# Patient Record
Sex: Female | Born: 1955 | Race: Black or African American | Hispanic: No | Smoking: Former smoker
Health system: Southern US, Community
[De-identification: ages and names within clinical notes are randomized; demographics above are authoritative.]

## PROBLEM LIST (undated history)

## (undated) DIAGNOSIS — G8112 Spastic hemiplegia affecting left dominant side: Secondary | ICD-10-CM

## (undated) DIAGNOSIS — K922 Gastrointestinal hemorrhage, unspecified: Secondary | ICD-10-CM

## (undated) DIAGNOSIS — K759 Inflammatory liver disease, unspecified: Secondary | ICD-10-CM

## (undated) DIAGNOSIS — I10 Essential (primary) hypertension: Secondary | ICD-10-CM

## (undated) DIAGNOSIS — G8929 Other chronic pain: Secondary | ICD-10-CM

## (undated) DIAGNOSIS — I639 Cerebral infarction, unspecified: Secondary | ICD-10-CM

## (undated) DIAGNOSIS — E785 Hyperlipidemia, unspecified: Secondary | ICD-10-CM

## (undated) DIAGNOSIS — M79606 Pain in leg, unspecified: Secondary | ICD-10-CM

## (undated) DIAGNOSIS — F4310 Post-traumatic stress disorder, unspecified: Secondary | ICD-10-CM

## (undated) DIAGNOSIS — K529 Noninfective gastroenteritis and colitis, unspecified: Secondary | ICD-10-CM

## (undated) DIAGNOSIS — F1010 Alcohol abuse, uncomplicated: Secondary | ICD-10-CM

## (undated) DIAGNOSIS — K259 Gastric ulcer, unspecified as acute or chronic, without hemorrhage or perforation: Secondary | ICD-10-CM

## (undated) DIAGNOSIS — K760 Fatty (change of) liver, not elsewhere classified: Secondary | ICD-10-CM

## (undated) DIAGNOSIS — K859 Acute pancreatitis without necrosis or infection, unspecified: Secondary | ICD-10-CM

## (undated) DIAGNOSIS — H539 Unspecified visual disturbance: Secondary | ICD-10-CM

## (undated) DIAGNOSIS — R569 Unspecified convulsions: Secondary | ICD-10-CM

## (undated) DIAGNOSIS — H548 Legal blindness, as defined in USA: Secondary | ICD-10-CM

## (undated) DIAGNOSIS — F329 Major depressive disorder, single episode, unspecified: Secondary | ICD-10-CM

## (undated) DIAGNOSIS — M549 Dorsalgia, unspecified: Secondary | ICD-10-CM

## (undated) DIAGNOSIS — N390 Urinary tract infection, site not specified: Secondary | ICD-10-CM

## (undated) HISTORY — DX: Unspecified convulsions: R56.9

## (undated) HISTORY — DX: Gastric ulcer, unspecified as acute or chronic, without hemorrhage or perforation: K25.9

## (undated) HISTORY — DX: Unspecified visual disturbance: H53.9

## (undated) HISTORY — DX: Pain in leg, unspecified: M79.606

## (undated) HISTORY — DX: Fatty (change of) liver, not elsewhere classified: K76.0

## (undated) HISTORY — DX: Legal blindness, as defined in USA: H54.8

## (undated) HISTORY — PX: APPENDECTOMY: SHX54

## (undated) HISTORY — PX: ESOPHAGOGASTRODUODENOSCOPY ENDOSCOPY: SHX5814

## (undated) HISTORY — DX: Essential (primary) hypertension: I10

## (undated) HISTORY — PX: OTHER SURGICAL HISTORY: SHX169

## (undated) HISTORY — DX: Gastrointestinal hemorrhage, unspecified: K92.2

## (undated) HISTORY — PX: CATARACT EXTRACTION: SUR2

## (undated) HISTORY — DX: Spastic hemiplegia affecting left dominant side: G81.12

## (undated) HISTORY — PX: ABDOMINAL HYSTERECTOMY: SHX81

## (undated) HISTORY — DX: Hyperlipidemia, unspecified: E78.5

---

## 2005-12-25 ENCOUNTER — Emergency Department (HOSPITAL_COMMUNITY): Admission: EM | Admit: 2005-12-25 | Discharge: 2005-12-25 | Payer: Self-pay | Admitting: Emergency Medicine

## 2006-01-31 ENCOUNTER — Emergency Department (HOSPITAL_COMMUNITY): Admission: EM | Admit: 2006-01-31 | Discharge: 2006-01-31 | Payer: Self-pay | Admitting: Emergency Medicine

## 2006-03-21 ENCOUNTER — Emergency Department (HOSPITAL_COMMUNITY): Admission: EM | Admit: 2006-03-21 | Discharge: 2006-03-22 | Payer: Self-pay | Admitting: Emergency Medicine

## 2006-03-27 ENCOUNTER — Ambulatory Visit: Payer: Self-pay | Admitting: Nurse Practitioner

## 2006-04-05 ENCOUNTER — Ambulatory Visit: Payer: Self-pay | Admitting: Internal Medicine

## 2006-05-22 ENCOUNTER — Ambulatory Visit: Payer: Self-pay | Admitting: Internal Medicine

## 2006-05-22 ENCOUNTER — Inpatient Hospital Stay (HOSPITAL_COMMUNITY): Admission: EM | Admit: 2006-05-22 | Discharge: 2006-05-26 | Payer: Self-pay | Admitting: Emergency Medicine

## 2006-05-23 ENCOUNTER — Encounter: Payer: Self-pay | Admitting: Gastroenterology

## 2006-05-23 ENCOUNTER — Encounter (INDEPENDENT_AMBULATORY_CARE_PROVIDER_SITE_OTHER): Payer: Self-pay | Admitting: Cardiology

## 2006-05-23 DIAGNOSIS — K2100 Gastro-esophageal reflux disease with esophagitis, without bleeding: Secondary | ICD-10-CM | POA: Insufficient documentation

## 2006-05-30 ENCOUNTER — Ambulatory Visit: Payer: Self-pay | Admitting: Gastroenterology

## 2006-06-02 ENCOUNTER — Ambulatory Visit: Payer: Self-pay | Admitting: Nurse Practitioner

## 2006-06-24 ENCOUNTER — Emergency Department (HOSPITAL_COMMUNITY): Admission: EM | Admit: 2006-06-24 | Discharge: 2006-06-25 | Payer: Self-pay | Admitting: Emergency Medicine

## 2006-06-30 ENCOUNTER — Inpatient Hospital Stay (HOSPITAL_COMMUNITY): Admission: EM | Admit: 2006-06-30 | Discharge: 2006-07-06 | Payer: Self-pay | Admitting: Emergency Medicine

## 2006-08-18 ENCOUNTER — Emergency Department (HOSPITAL_COMMUNITY): Admission: EM | Admit: 2006-08-18 | Discharge: 2006-08-19 | Payer: Self-pay | Admitting: Emergency Medicine

## 2006-09-05 ENCOUNTER — Emergency Department (HOSPITAL_COMMUNITY): Admission: EM | Admit: 2006-09-05 | Discharge: 2006-09-05 | Payer: Self-pay | Admitting: Emergency Medicine

## 2006-10-04 ENCOUNTER — Ambulatory Visit: Payer: Self-pay | Admitting: Internal Medicine

## 2006-10-18 ENCOUNTER — Emergency Department (HOSPITAL_COMMUNITY): Admission: EM | Admit: 2006-10-18 | Discharge: 2006-10-18 | Payer: Self-pay | Admitting: Emergency Medicine

## 2006-11-01 ENCOUNTER — Ambulatory Visit: Payer: Self-pay | Admitting: Gastroenterology

## 2006-11-09 ENCOUNTER — Ambulatory Visit (HOSPITAL_COMMUNITY): Admission: RE | Admit: 2006-11-09 | Discharge: 2006-11-09 | Payer: Self-pay | Admitting: Gastroenterology

## 2006-11-09 ENCOUNTER — Encounter: Payer: Self-pay | Admitting: Internal Medicine

## 2006-11-09 DIAGNOSIS — K859 Acute pancreatitis without necrosis or infection, unspecified: Secondary | ICD-10-CM

## 2006-11-15 ENCOUNTER — Ambulatory Visit: Payer: Self-pay | Admitting: Gastroenterology

## 2006-11-16 ENCOUNTER — Inpatient Hospital Stay (HOSPITAL_COMMUNITY): Admission: EM | Admit: 2006-11-16 | Discharge: 2006-11-20 | Payer: Self-pay | Admitting: Emergency Medicine

## 2006-11-30 ENCOUNTER — Ambulatory Visit: Payer: Self-pay | Admitting: Internal Medicine

## 2007-02-02 ENCOUNTER — Emergency Department (HOSPITAL_COMMUNITY): Admission: EM | Admit: 2007-02-02 | Discharge: 2007-02-02 | Payer: Self-pay | Admitting: Emergency Medicine

## 2007-06-16 DIAGNOSIS — I6350 Cerebral infarction due to unspecified occlusion or stenosis of unspecified cerebral artery: Secondary | ICD-10-CM | POA: Insufficient documentation

## 2007-06-16 DIAGNOSIS — F1010 Alcohol abuse, uncomplicated: Secondary | ICD-10-CM | POA: Insufficient documentation

## 2007-06-16 DIAGNOSIS — K219 Gastro-esophageal reflux disease without esophagitis: Secondary | ICD-10-CM | POA: Insufficient documentation

## 2007-06-16 DIAGNOSIS — F329 Major depressive disorder, single episode, unspecified: Secondary | ICD-10-CM

## 2007-06-16 DIAGNOSIS — F1410 Cocaine abuse, uncomplicated: Secondary | ICD-10-CM | POA: Insufficient documentation

## 2007-06-16 DIAGNOSIS — J45909 Unspecified asthma, uncomplicated: Secondary | ICD-10-CM | POA: Insufficient documentation

## 2007-06-16 DIAGNOSIS — I10 Essential (primary) hypertension: Secondary | ICD-10-CM | POA: Insufficient documentation

## 2007-08-14 ENCOUNTER — Emergency Department (HOSPITAL_COMMUNITY): Admission: EM | Admit: 2007-08-14 | Discharge: 2007-08-15 | Payer: Self-pay | Admitting: Emergency Medicine

## 2007-10-30 ENCOUNTER — Inpatient Hospital Stay (HOSPITAL_COMMUNITY): Admission: AD | Admit: 2007-10-30 | Discharge: 2007-11-08 | Payer: Self-pay | Admitting: *Deleted

## 2007-10-30 ENCOUNTER — Ambulatory Visit: Payer: Self-pay | Admitting: *Deleted

## 2008-01-06 ENCOUNTER — Encounter: Payer: Self-pay | Admitting: Emergency Medicine

## 2008-01-06 ENCOUNTER — Inpatient Hospital Stay (HOSPITAL_COMMUNITY): Admission: AD | Admit: 2008-01-06 | Discharge: 2008-01-14 | Payer: Self-pay | Admitting: Psychiatry

## 2008-01-06 ENCOUNTER — Ambulatory Visit: Payer: Self-pay | Admitting: Psychiatry

## 2008-01-16 ENCOUNTER — Encounter: Admission: RE | Admit: 2008-01-16 | Discharge: 2008-01-16 | Payer: Self-pay | Admitting: Internal Medicine

## 2008-01-18 ENCOUNTER — Emergency Department (HOSPITAL_COMMUNITY): Admission: EM | Admit: 2008-01-18 | Discharge: 2008-01-18 | Payer: Self-pay | Admitting: Emergency Medicine

## 2008-03-12 ENCOUNTER — Emergency Department (HOSPITAL_COMMUNITY): Admission: EM | Admit: 2008-03-12 | Discharge: 2008-03-13 | Payer: Self-pay | Admitting: Emergency Medicine

## 2008-04-02 ENCOUNTER — Inpatient Hospital Stay (HOSPITAL_COMMUNITY): Admission: EM | Admit: 2008-04-02 | Discharge: 2008-04-07 | Payer: Self-pay | Admitting: Emergency Medicine

## 2008-04-05 ENCOUNTER — Ambulatory Visit: Payer: Self-pay | Admitting: Gastroenterology

## 2008-05-24 ENCOUNTER — Emergency Department (HOSPITAL_COMMUNITY): Admission: EM | Admit: 2008-05-24 | Discharge: 2008-05-24 | Payer: Self-pay | Admitting: Emergency Medicine

## 2008-08-09 ENCOUNTER — Emergency Department (HOSPITAL_COMMUNITY): Admission: EM | Admit: 2008-08-09 | Discharge: 2008-08-09 | Payer: Self-pay | Admitting: Emergency Medicine

## 2008-10-17 ENCOUNTER — Encounter: Admission: RE | Admit: 2008-10-17 | Discharge: 2008-10-17 | Payer: Self-pay | Admitting: Internal Medicine

## 2008-12-28 ENCOUNTER — Emergency Department (HOSPITAL_COMMUNITY): Admission: EM | Admit: 2008-12-28 | Discharge: 2008-12-28 | Payer: Self-pay | Admitting: Emergency Medicine

## 2009-01-01 ENCOUNTER — Inpatient Hospital Stay (HOSPITAL_COMMUNITY): Admission: EM | Admit: 2009-01-01 | Discharge: 2009-01-06 | Payer: Self-pay | Admitting: Emergency Medicine

## 2009-10-18 ENCOUNTER — Emergency Department (HOSPITAL_COMMUNITY): Admission: EM | Admit: 2009-10-18 | Discharge: 2009-10-18 | Payer: Self-pay | Admitting: Emergency Medicine

## 2009-10-24 IMAGING — CR DG CHEST 2V
2 series · 2 of 2 positions shown · non-contrast
Comparison: [HOSPITAL] chest x-ray 08/09/2008.

CLINICAL DATA: Positive PPD.

CHEST - 2 VIEW

[w chest pa]
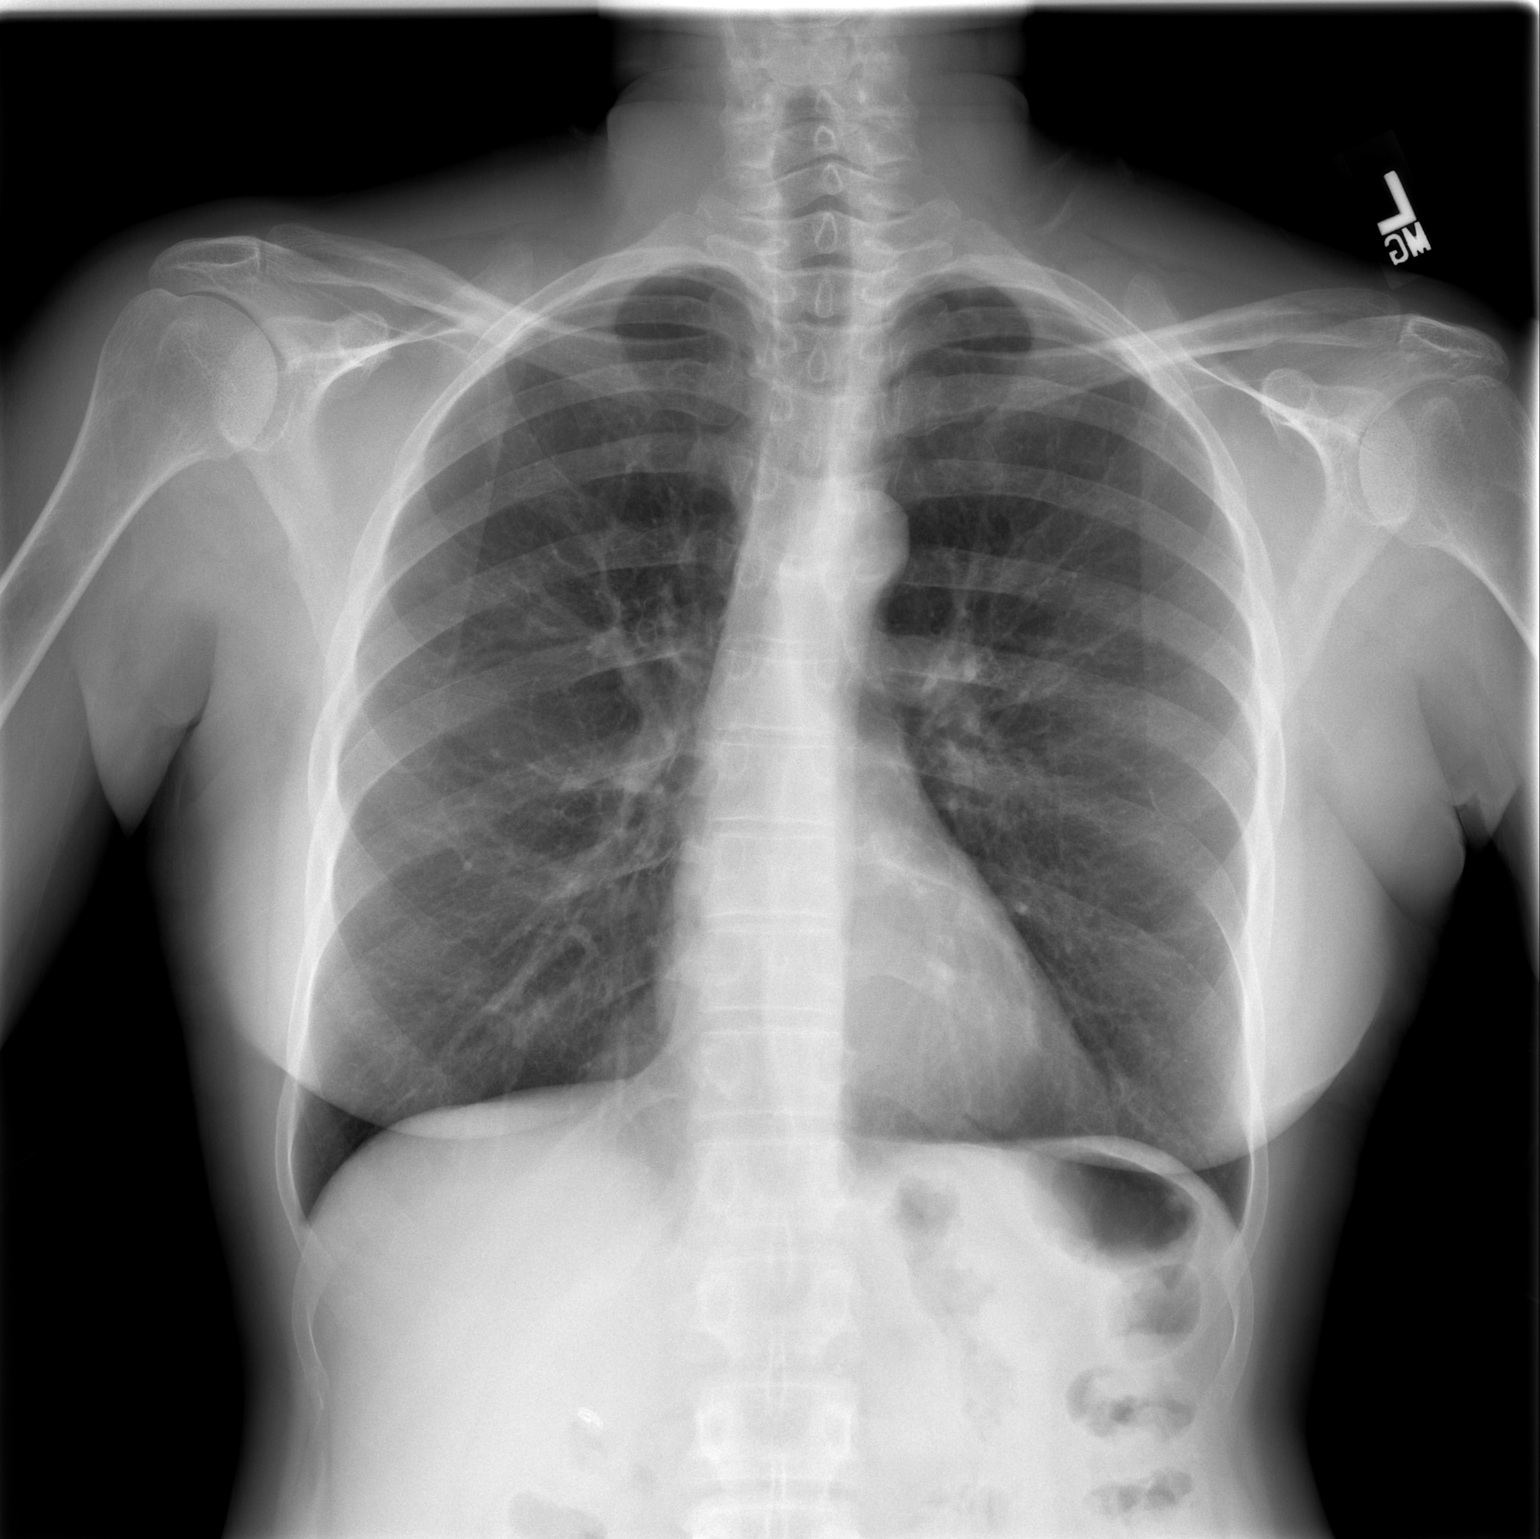

[w chest lat]
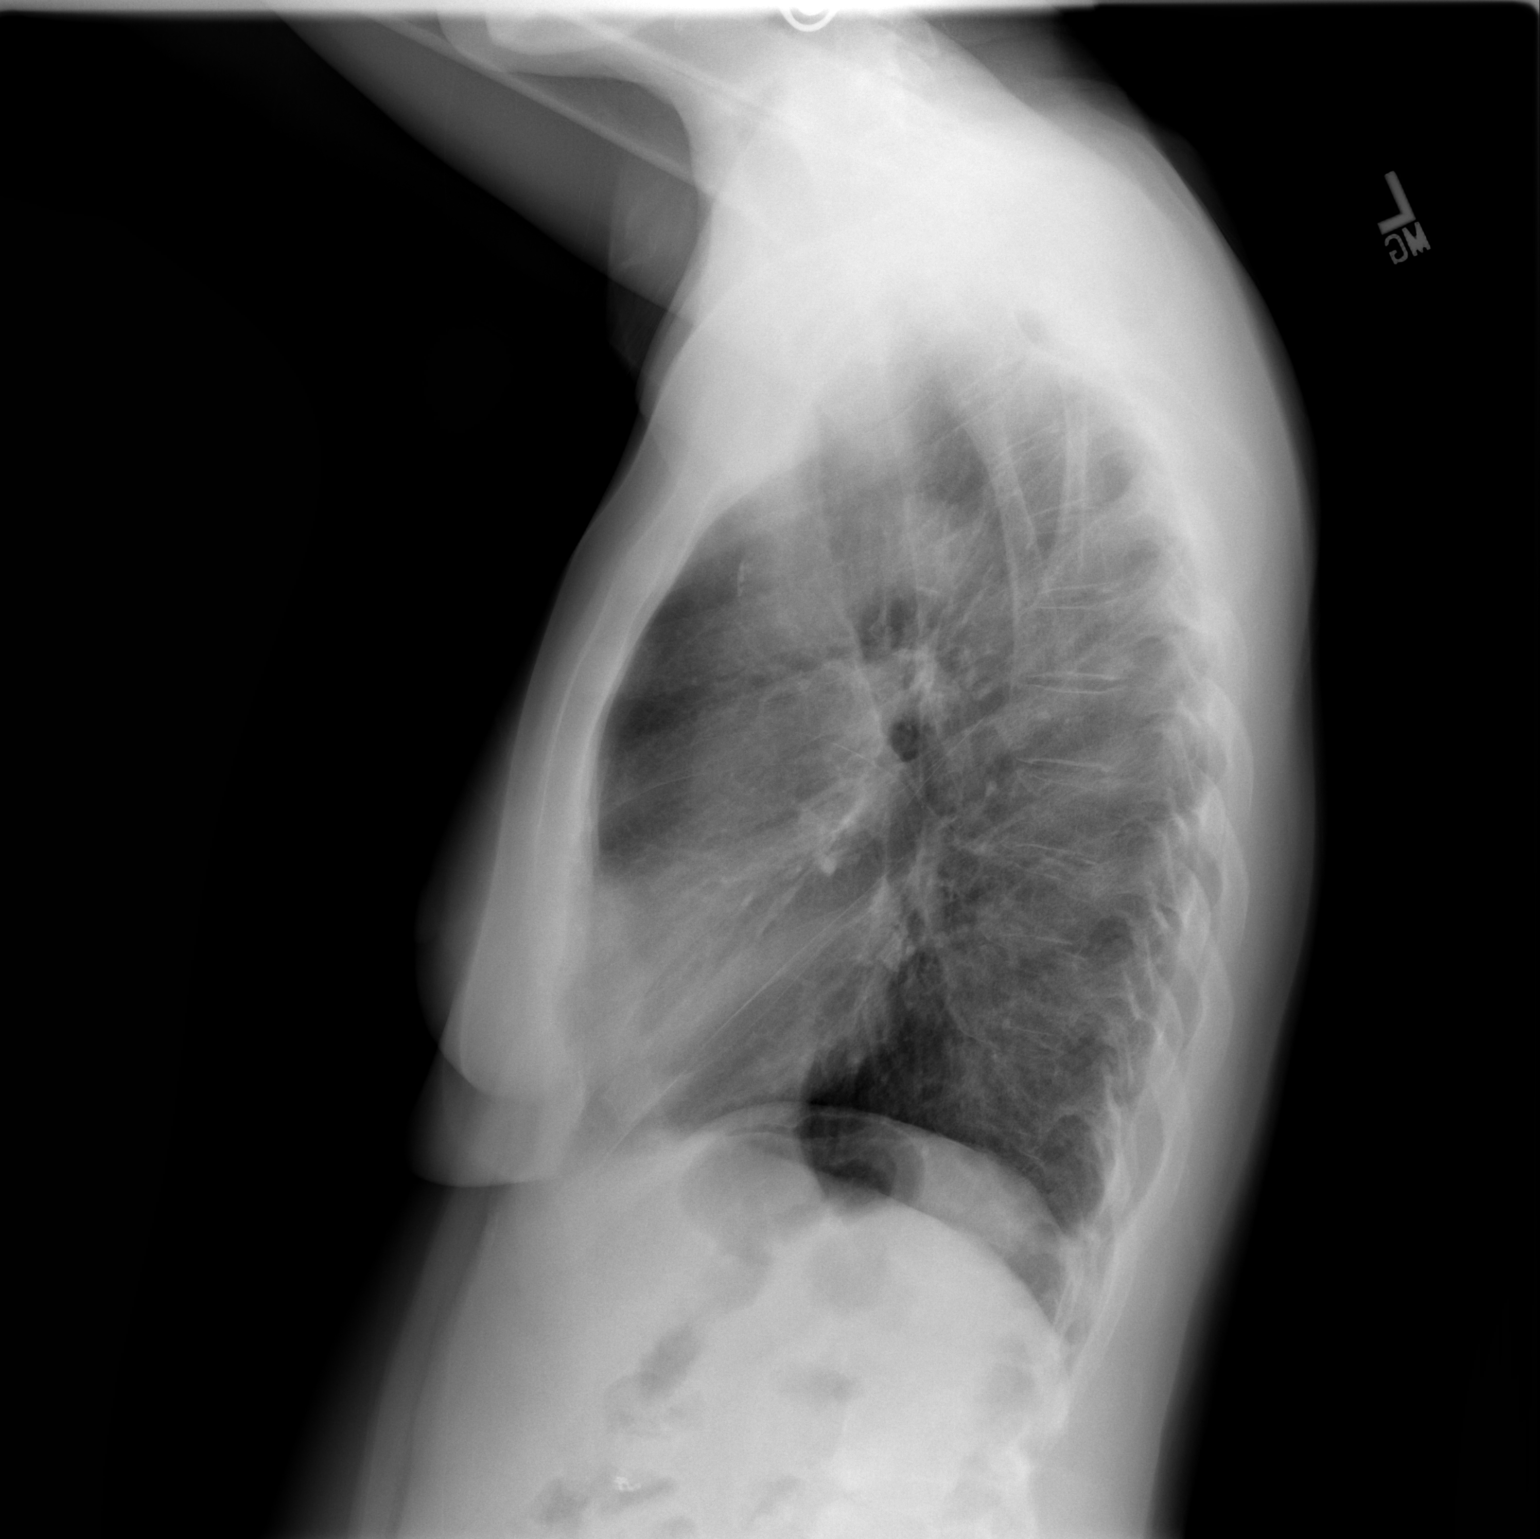

[2 of 2 positions shown; findings below may reference images not displayed]

FINDINGS: Lungs are clear.  Heart size and configuration normal.
Mediastinum, hila, pleura and osseous structures remains stable and
unremarkable.
IMPRESSION: Stable.  No active cardiopulmonary disease.

## 2010-02-25 ENCOUNTER — Encounter
Admission: RE | Admit: 2010-02-25 | Discharge: 2010-02-25 | Payer: Self-pay | Source: Home / Self Care | Attending: Internal Medicine | Admitting: Internal Medicine

## 2010-04-06 ENCOUNTER — Encounter
Admission: RE | Admit: 2010-04-06 | Discharge: 2010-04-13 | Payer: Self-pay | Source: Home / Self Care | Attending: Internal Medicine | Admitting: Internal Medicine

## 2010-04-14 ENCOUNTER — Ambulatory Visit: Payer: Self-pay

## 2010-04-19 ENCOUNTER — Emergency Department (HOSPITAL_COMMUNITY)
Admission: EM | Admit: 2010-04-19 | Discharge: 2010-04-19 | Disposition: A | Discharge status: Home / Self Care | Payer: Medicaid Other | Attending: Emergency Medicine | Admitting: Emergency Medicine

## 2010-04-19 DIAGNOSIS — D571 Sickle-cell disease without crisis: Secondary | ICD-10-CM | POA: Insufficient documentation

## 2010-04-19 DIAGNOSIS — R1013 Epigastric pain: Secondary | ICD-10-CM | POA: Insufficient documentation

## 2010-04-19 DIAGNOSIS — R112 Nausea with vomiting, unspecified: Secondary | ICD-10-CM | POA: Insufficient documentation

## 2010-04-19 DIAGNOSIS — R197 Diarrhea, unspecified: Secondary | ICD-10-CM | POA: Insufficient documentation

## 2010-04-19 DIAGNOSIS — I10 Essential (primary) hypertension: Secondary | ICD-10-CM | POA: Insufficient documentation

## 2010-04-19 LAB — DIFFERENTIAL
Basophils Absolute: 0 10*3/uL (ref 0.0–0.1)
Basophils Relative: 0 % (ref 0–1)
Eosinophils Absolute: 0 10*3/uL (ref 0.0–0.7)
Eosinophils Relative: 0 % (ref 0–5)
Lymphocytes Relative: 38 % (ref 12–46)
Lymphs Abs: 3.2 10*3/uL (ref 0.7–4.0)
Monocytes Absolute: 0.5 10*3/uL (ref 0.1–1.0)
Monocytes Relative: 6 % (ref 3–12)
Neutro Abs: 4.7 10*3/uL (ref 1.7–7.7)
Neutrophils Relative %: 56 % (ref 43–77)

## 2010-04-19 LAB — URINALYSIS, ROUTINE W REFLEX MICROSCOPIC
Bilirubin Urine: NEGATIVE
Hgb urine dipstick: NEGATIVE
Ketones, ur: NEGATIVE mg/dL
Nitrite: NEGATIVE
Protein, ur: NEGATIVE mg/dL
Specific Gravity, Urine: 1.011 (ref 1.005–1.030)
Urine Glucose, Fasting: NEGATIVE mg/dL
Urobilinogen, UA: 0.2 mg/dL (ref 0.0–1.0)
pH: 5.5 (ref 5.0–8.0)

## 2010-04-19 LAB — URINE MICROSCOPIC-ADD ON

## 2010-04-19 LAB — COMPREHENSIVE METABOLIC PANEL
ALT: 22 U/L (ref 0–35)
AST: 32 U/L (ref 0–37)
Albumin: 4.4 g/dL (ref 3.5–5.2)
Alkaline Phosphatase: 69 U/L (ref 39–117)
BUN: 21 mg/dL (ref 6–23)
CO2: 22 mEq/L (ref 19–32)
Calcium: 9.3 mg/dL (ref 8.4–10.5)
Chloride: 107 mEq/L (ref 96–112)
Creatinine, Ser: 1.15 mg/dL (ref 0.4–1.2)
GFR calc non Af Amer: 49 mL/min — ABNORMAL LOW (ref >60)
Glucose, Bld: 79 mg/dL (ref 70–99)
Potassium: 4.2 mEq/L (ref 3.5–5.1)
Sodium: 141 mEq/L (ref 135–145)
Total Bilirubin: 0.6 mg/dL (ref 0.3–1.2)
Total Protein: 8.1 g/dL (ref 6.0–8.3)

## 2010-04-19 LAB — CBC
HCT: 41.1 % (ref 36.0–46.0)
Hemoglobin: 14.4 g/dL (ref 12.0–15.0)
MCH: 32.1 pg (ref 26.0–34.0)
MCHC: 35 g/dL (ref 30.0–36.0)

## 2010-04-19 LAB — LIPASE, BLOOD: Lipase: 28 U/L (ref 11–59)

## 2010-05-08 ENCOUNTER — Emergency Department (HOSPITAL_COMMUNITY)
Admission: EM | Admit: 2010-05-08 | Discharge: 2010-05-08 | Disposition: A | Discharge status: Home / Self Care | Payer: Medicaid Other | Attending: Emergency Medicine | Admitting: Emergency Medicine

## 2010-05-08 ENCOUNTER — Emergency Department (HOSPITAL_COMMUNITY): Payer: Medicaid Other

## 2010-05-08 DIAGNOSIS — R112 Nausea with vomiting, unspecified: Secondary | ICD-10-CM | POA: Insufficient documentation

## 2010-05-08 DIAGNOSIS — I10 Essential (primary) hypertension: Secondary | ICD-10-CM | POA: Insufficient documentation

## 2010-05-08 DIAGNOSIS — F3289 Other specified depressive episodes: Secondary | ICD-10-CM | POA: Insufficient documentation

## 2010-05-08 DIAGNOSIS — R1013 Epigastric pain: Secondary | ICD-10-CM | POA: Insufficient documentation

## 2010-05-08 DIAGNOSIS — F329 Major depressive disorder, single episode, unspecified: Secondary | ICD-10-CM | POA: Insufficient documentation

## 2010-05-08 DIAGNOSIS — H409 Unspecified glaucoma: Secondary | ICD-10-CM | POA: Insufficient documentation

## 2010-05-08 LAB — DIFFERENTIAL
Basophils Absolute: 0 10*3/uL (ref 0.0–0.1)
Basophils Relative: 0 % (ref 0–1)
Eosinophils Relative: 1 % (ref 0–5)
Lymphocytes Relative: 32 % (ref 12–46)
Monocytes Absolute: 0.8 10*3/uL (ref 0.1–1.0)

## 2010-05-08 LAB — COMPREHENSIVE METABOLIC PANEL
AST: 26 U/L (ref 0–37)
Albumin: 4.2 g/dL (ref 3.5–5.2)
Calcium: 9.3 mg/dL (ref 8.4–10.5)
Creatinine, Ser: 0.82 mg/dL (ref 0.4–1.2)
GFR calc Af Amer: >60 mL/min (ref >60)
GFR calc non Af Amer: >60 mL/min (ref >60)
Total Protein: 7.5 g/dL (ref 6.0–8.3)

## 2010-05-08 LAB — URINALYSIS, ROUTINE W REFLEX MICROSCOPIC
Bilirubin Urine: NEGATIVE
Ketones, ur: NEGATIVE mg/dL
Protein, ur: NEGATIVE mg/dL
Urobilinogen, UA: 0.2 mg/dL (ref 0.0–1.0)

## 2010-05-08 LAB — URINE MICROSCOPIC-ADD ON

## 2010-05-08 LAB — CBC
HCT: 36.2 % (ref 36.0–46.0)
MCHC: 34.5 g/dL (ref 30.0–36.0)
RDW: 13.3 % (ref 11.5–15.5)

## 2010-05-17 ENCOUNTER — Other Ambulatory Visit: Payer: Self-pay | Admitting: Gastroenterology

## 2010-05-17 ENCOUNTER — Encounter: Payer: Self-pay | Admitting: Gastroenterology

## 2010-05-17 ENCOUNTER — Ambulatory Visit (INDEPENDENT_AMBULATORY_CARE_PROVIDER_SITE_OTHER): Payer: Medicaid Other | Admitting: Gastroenterology

## 2010-05-17 DIAGNOSIS — R109 Unspecified abdominal pain: Secondary | ICD-10-CM

## 2010-05-17 DIAGNOSIS — R1032 Left lower quadrant pain: Secondary | ICD-10-CM

## 2010-05-17 LAB — COMPREHENSIVE METABOLIC PANEL
Albumin: 5.2 g/dL (ref 3.5–5.2)
Alkaline Phosphatase: 84 U/L (ref 39–117)
BUN: 15 mg/dL (ref 6–23)
Calcium: 9.8 mg/dL (ref 8.4–10.5)
Chloride: 101 mEq/L (ref 96–112)
Glucose, Bld: 79 mg/dL (ref 70–99)
Potassium: 3.8 mEq/L (ref 3.5–5.1)

## 2010-05-17 LAB — CBC WITH DIFFERENTIAL/PLATELET
Basophils Relative: 0.9 % (ref 0.0–3.0)
Eosinophils Relative: 0.1 % (ref 0.0–5.0)
HCT: 42.5 % (ref 36.0–46.0)
Hemoglobin: 14.8 g/dL (ref 12.0–15.0)
MCV: 93.3 fl (ref 78.0–100.0)
Monocytes Absolute: 0.7 10*3/uL (ref 0.1–1.0)
Neutrophils Relative %: 54.4 % (ref 43.0–77.0)
RBC: 4.56 Mil/uL (ref 3.87–5.11)
WBC: 9.3 10*3/uL (ref 4.5–10.5)

## 2010-05-17 LAB — SEDIMENTATION RATE: Sed Rate: 20 mm/hr (ref 0–22)

## 2010-05-19 ENCOUNTER — Ambulatory Visit (INDEPENDENT_AMBULATORY_CARE_PROVIDER_SITE_OTHER)
Admission: RE | Admit: 2010-05-19 | Discharge: 2010-05-19 | Disposition: A | Discharge status: Home / Self Care | Payer: Medicaid Other | Source: Ambulatory Visit | Attending: Gastroenterology | Admitting: Gastroenterology

## 2010-05-19 DIAGNOSIS — R109 Unspecified abdominal pain: Secondary | ICD-10-CM

## 2010-05-19 MED ORDER — IOHEXOL 300 MG/ML  SOLN
100.0000 mL | Freq: Once | INTRAMUSCULAR | Status: AC | PRN
  Administered 2010-05-19: 100 mL via INTRAVENOUS

## 2010-05-25 NOTE — Assessment & Plan Note (Signed)
History of Present Illness Visit Type: Initial Consult Primary GI MD: Rob Bunting MD Primary Provider: Lonia Blood, MD Requesting Provider: Lonia Blood, MD Chief Complaint: abdominal pain & diarrhea History of Present Illness:       pleasant 55 year old woman  who has abdominal pains.  keep her from sleeping, going on for 3-4 months.  Pain in left side, lower.  Pain is contant.  she has gone to ER recently, she had xrays (unsure of results).  Blood tests done.  Unsure of any results.     No constipation.  Overall weight stable.  Might be gaining weight.  drinks 6 alcohol/day on weekends.  used to drink more.  She used to have trouble with alcohol.  she used to take percocets, but no pain meds now. no nsaids.   I reviewed some records from the [facility] chart system. She has been admitted with what looks like acute alcohol detox. She has abdominal pains for many years. She had a CT scan in 2010 and is suggested both biliary and pancreatic duct dilation there was also a question of some colonic abnormality.   labs done 2-3 weeks ago showed normal CBC, normal complete metabolic profile.             Current Medications (verified): 1)  Alprazolam 0.25 Mg Tabs (Alprazolam) .... At Bedtime 2)  Eye Drops .... Two Times A Day  Allergies (verified): 1)  ! Penicillin 2)  ! Motrin  Past History:  Past Medical History:  substance abuse alcoholism,  depression, fibromyalgia, gallstones, elevated cholesterol, acute pancreatitis 2009, stroke 1999  Past Surgical History:  appendectomy, cholecystectomy, hysterectomy.  Family History:  no colon cancer  Social History:  she is widowed, she has no children, she currently smokes cigarettes and drinks several alcoholic beverages on the weekends and sometimes more often, she drinks caffeinated beverages 4 of them a day.  Review of Systems       Pertinent positive and negative review of systems were noted in the above HPI and GI  specific review of systems.  All other review of systems was otherwise negative.   Vital Signs:  Patient profile:   55 year old female Height:      64 inches Weight:      128.38 pounds BMI:     22.12 Pulse rate:   92 / minute Pulse rhythm:   regular BP sitting:   138 / 86  (left arm) Cuff size:   regular  Vitals Entered By: June McMurray CMA Duncan Dull) (May 17, 2010 2:18 PM)  Physical Exam  Additional Exam:  Constitutional: generally well appearing Psychiatric: alert and oriented times 3 Eyes:  she is legally blind Mouth: oropharynx moist, no lesions Neck: supple, no lymphadenopathy Cardiovascular: heart regular rate and rythm Lungs: CTA bilaterally Abdomen: soft, non-tender, non-distended, no obvious ascites, no peritoneal signs, normal bowel sounds Extremities: no lower extremity edema bilaterally Skin: no lesions on visible extremities    Impression & Recommendations:  Problem # 1:   chronic abdominal pain  unclear etiology however I think she first needs a CT scan abdomen and pelvis for evaluation.   she'll get a CBC, complete metabolic profile and sedimentation rate today. She might need endoscopic evaluations of colon and upper GI tract but that will depend on the above results. She should stop drinking, she should stop smoking.  Other Orders: TLB-CBC Platelet - w/Differential (85025-CBCD) TLB-CMP (Comprehensive Metabolic Pnl) (80053-COMP) TLB-Sedimentation Rate (ESR) (85652-ESR)  Patient Instructions: 1)  You will be scheduled for a CT scan of abdomen and pelvis with IV and oral contrast. 2)  You will get lab test(s) done today (cbc, cmet, esr). 3)  You might colonoscopy and or upper endoscopy to evaluate the pain, depending on results of CT and labs. 4)  Try to cut back on drinking. 5)  One of your biggest health concerns is your smoking.  You should try your absolute best to stop.  If you need assistence, please contact your PCP or Smoking Cessation Class at  [facility] [phone]) or [facility] Quit-Line (1-800-QUIT-NOW). 6)  A copy of this information will be sent to Dr. Mikeal Hawthorne. 7)  The medication list was reviewed and reconciled.  All changed / newly prescribed medications were explained.  A complete medication list was provided to the patient / caregiver.  Appended Document: Orders Update/ct    Clinical Lists Changes  Orders: Added new Referral order of CT Abdomen/Pelvis with Contrast (CT Abd/Pelvis w/con) - Signed

## 2010-05-27 ENCOUNTER — Encounter (INDEPENDENT_AMBULATORY_CARE_PROVIDER_SITE_OTHER): Payer: Self-pay | Admitting: *Deleted

## 2010-05-27 ENCOUNTER — Emergency Department (HOSPITAL_COMMUNITY)
Admission: EM | Admit: 2010-05-27 | Discharge: 2010-05-28 | Disposition: A | Discharge status: Home / Self Care | Payer: Medicaid Other | Attending: Emergency Medicine | Admitting: Emergency Medicine

## 2010-05-27 DIAGNOSIS — F329 Major depressive disorder, single episode, unspecified: Secondary | ICD-10-CM | POA: Insufficient documentation

## 2010-05-27 DIAGNOSIS — N390 Urinary tract infection, site not specified: Secondary | ICD-10-CM | POA: Insufficient documentation

## 2010-05-27 DIAGNOSIS — R112 Nausea with vomiting, unspecified: Secondary | ICD-10-CM | POA: Insufficient documentation

## 2010-05-27 DIAGNOSIS — R197 Diarrhea, unspecified: Secondary | ICD-10-CM | POA: Insufficient documentation

## 2010-05-27 DIAGNOSIS — I10 Essential (primary) hypertension: Secondary | ICD-10-CM | POA: Insufficient documentation

## 2010-05-27 DIAGNOSIS — E876 Hypokalemia: Secondary | ICD-10-CM | POA: Insufficient documentation

## 2010-05-27 DIAGNOSIS — F3289 Other specified depressive episodes: Secondary | ICD-10-CM | POA: Insufficient documentation

## 2010-05-27 DIAGNOSIS — IMO0001 Reserved for inherently not codable concepts without codable children: Secondary | ICD-10-CM | POA: Insufficient documentation

## 2010-05-27 LAB — COMPREHENSIVE METABOLIC PANEL
AST: 738 U/L — ABNORMAL HIGH (ref 0–37)
Albumin: 4.3 g/dL (ref 3.5–5.2)
Alkaline Phosphatase: 91 U/L (ref 39–117)
Chloride: 103 mEq/L (ref 96–112)
GFR calc Af Amer: 59 mL/min — ABNORMAL LOW (ref >60)
Potassium: 2.9 mEq/L — ABNORMAL LOW (ref 3.5–5.1)
Total Bilirubin: 1.1 mg/dL (ref 0.3–1.2)

## 2010-05-27 LAB — CBC
Platelets: 115 10*3/uL — ABNORMAL LOW (ref 150–400)
RBC: 4.37 MIL/uL (ref 3.87–5.11)
WBC: 6.5 10*3/uL (ref 4.0–10.5)

## 2010-05-27 LAB — DIFFERENTIAL
Basophils Absolute: 0 10*3/uL (ref 0.0–0.1)
Basophils Relative: 0 % (ref 0–1)
Eosinophils Absolute: 0 10*3/uL (ref 0.0–0.7)
Neutrophils Relative %: 48 % (ref 43–77)

## 2010-05-28 LAB — CBC
Hemoglobin: 12.3 g/dL (ref 12.0–15.0)
MCHC: 34.3 g/dL (ref 30.0–36.0)
Platelets: 128 10*3/uL — ABNORMAL LOW (ref 150–400)

## 2010-05-28 LAB — URINALYSIS, ROUTINE W REFLEX MICROSCOPIC
Glucose, UA: NEGATIVE mg/dL
Ketones, ur: 15 mg/dL — AB
Protein, ur: NEGATIVE mg/dL

## 2010-05-28 LAB — COMPREHENSIVE METABOLIC PANEL
ALT: 16 U/L (ref 0–35)
AST: 30 U/L (ref 0–37)
Albumin: 3.6 g/dL (ref 3.5–5.2)
CO2: 24 mEq/L (ref 19–32)
Calcium: 8.9 mg/dL (ref 8.4–10.5)
GFR calc Af Amer: >60 mL/min (ref >60)
Sodium: 143 mEq/L (ref 135–145)
Total Protein: 6.8 g/dL (ref 6.0–8.3)

## 2010-05-28 LAB — DIFFERENTIAL
Eosinophils Absolute: 0 10*3/uL (ref 0.0–0.7)
Eosinophils Relative: 0 % (ref 0–5)
Lymphocytes Relative: 36 % (ref 12–46)
Lymphs Abs: 2.4 10*3/uL (ref 0.7–4.0)
Monocytes Absolute: 0.6 10*3/uL (ref 0.1–1.0)
Monocytes Relative: 10 % (ref 3–12)

## 2010-05-28 LAB — URINE MICROSCOPIC-ADD ON

## 2010-06-01 NOTE — Letter (Signed)
Summary: Results Letter  [facility]  [address] [place], [place] [address]   Phone: [phone]  Fax: [phone]        May 27, 2010 MRN: [id number]    [facility] [address] [place], [place]  [address]    Dear Ms. Theys,   We have been unable to reach you by phone to discuss your CT scan results.  Please call so that we may provide you with the best care. Thank you for your time.      Sincerely,  Chales Abrahams CMA (AAMA)  This letter has been electronically signed by your physician.  Appended Document: Results Letter letter mailed

## 2010-06-17 LAB — BASIC METABOLIC PANEL
BUN: 11 mg/dL (ref 6–23)
CO2: 24 mEq/L (ref 19–32)
CO2: 26 mEq/L (ref 19–32)
Calcium: 9.2 mg/dL (ref 8.4–10.5)
Chloride: 107 mEq/L (ref 96–112)
Chloride: 108 mEq/L (ref 96–112)
Creatinine, Ser: 0.86 mg/dL (ref 0.4–1.2)
Creatinine, Ser: 0.89 mg/dL (ref 0.4–1.2)
GFR calc Af Amer: >60 mL/min (ref >60)
GFR calc Af Amer: >60 mL/min (ref >60)
GFR calc Af Amer: >60 mL/min (ref >60)
Glucose, Bld: 96 mg/dL (ref 70–99)
Potassium: 4.5 mEq/L (ref 3.5–5.1)
Sodium: 140 mEq/L (ref 135–145)

## 2010-06-17 LAB — CBC
HCT: 38.2 % (ref 36.0–46.0)
HCT: 40.2 % (ref 36.0–46.0)
Hemoglobin: 13 g/dL (ref 12.0–15.0)
Hemoglobin: 13.4 g/dL (ref 12.0–15.0)
Hemoglobin: 13.9 g/dL (ref 12.0–15.0)
MCHC: 33.9 g/dL (ref 30.0–36.0)
MCHC: 34.2 g/dL (ref 30.0–36.0)
MCHC: 34.5 g/dL (ref 30.0–36.0)
MCV: 95.4 fL (ref 78.0–100.0)
MCV: 95.4 fL (ref 78.0–100.0)
MCV: 96.2 fL (ref 78.0–100.0)
MCV: 96.6 fL (ref 78.0–100.0)
Platelets: 42 10*3/uL — CL (ref 150–400)
RBC: 3.74 MIL/uL — ABNORMAL LOW (ref 3.87–5.11)
RBC: 3.89 MIL/uL (ref 3.87–5.11)
RBC: 3.95 MIL/uL (ref 3.87–5.11)
RBC: 4.06 MIL/uL (ref 3.87–5.11)
RDW: 13 % (ref 11.5–15.5)
RDW: 13.2 % (ref 11.5–15.5)
RDW: 13.5 % (ref 11.5–15.5)
WBC: 6.3 10*3/uL (ref 4.0–10.5)

## 2010-06-17 LAB — COMPREHENSIVE METABOLIC PANEL
Albumin: 5.2 g/dL (ref 3.5–5.2)
Alkaline Phosphatase: 102 U/L (ref 39–117)
BUN: 7 mg/dL (ref 6–23)
CO2: 27 mEq/L (ref 19–32)
Calcium: 9.4 mg/dL (ref 8.4–10.5)
Creatinine, Ser: 0.64 mg/dL (ref 0.4–1.2)
Creatinine, Ser: 0.79 mg/dL (ref 0.4–1.2)
GFR calc Af Amer: >60 mL/min (ref >60)
GFR calc non Af Amer: >60 mL/min (ref >60)
Glucose, Bld: 70 mg/dL (ref 70–99)
Glucose, Bld: 97 mg/dL (ref 70–99)
Total Bilirubin: 0.9 mg/dL (ref 0.3–1.2)
Total Protein: 8.9 g/dL — ABNORMAL HIGH (ref 6.0–8.3)

## 2010-06-17 LAB — DIFFERENTIAL
Basophils Absolute: 0 10*3/uL (ref 0.0–0.1)
Basophils Relative: 0 % (ref 0–1)
Lymphocytes Relative: 54 % — ABNORMAL HIGH (ref 12–46)
Monocytes Absolute: 0.4 10*3/uL (ref 0.1–1.0)
Monocytes Relative: 6 % (ref 3–12)
Neutro Abs: 2.8 10*3/uL (ref 1.7–7.7)
Neutrophils Relative %: 40 % — ABNORMAL LOW (ref 43–77)

## 2010-06-17 LAB — RPR: RPR Ser Ql: NONREACTIVE

## 2010-06-17 LAB — RAPID URINE DRUG SCREEN, HOSP PERFORMED
Barbiturates: NOT DETECTED
Cocaine: NOT DETECTED
Opiates: NOT DETECTED
Tetrahydrocannabinol: NOT DETECTED

## 2010-06-17 LAB — URINALYSIS, ROUTINE W REFLEX MICROSCOPIC
Glucose, UA: NEGATIVE mg/dL
Ketones, ur: NEGATIVE mg/dL
Nitrite: NEGATIVE
Protein, ur: NEGATIVE mg/dL

## 2010-06-17 LAB — ETHANOL: Alcohol, Ethyl (B): 260 mg/dL — ABNORMAL HIGH (ref 0–10)

## 2010-06-17 LAB — CARDIAC PANEL(CRET KIN+CKTOT+MB+TROPI)
CK, MB: 2.1 ng/mL (ref 0.3–4.0)
CK, MB: 2.2 ng/mL (ref 0.3–4.0)
Relative Index: 1 (ref 0.0–2.5)
Relative Index: 1 (ref 0.0–2.5)
Troponin I: 0.01 ng/mL (ref 0.00–0.06)

## 2010-06-17 LAB — VITAMIN B6: Vitamin B6: 14 ng/mL (ref 2.1–21.7)

## 2010-06-17 LAB — URINE CULTURE

## 2010-06-17 LAB — VITAMIN D 25 HYDROXY (VIT D DEFICIENCY, FRACTURES): Vit D, 25-Hydroxy: 12 ng/mL — ABNORMAL LOW (ref 30–89)

## 2010-06-17 LAB — POCT I-STAT, CHEM 8
BUN: 18 mg/dL (ref 6–23)
HCT: 50 % — ABNORMAL HIGH (ref 36.0–46.0)
Hemoglobin: 17 g/dL — ABNORMAL HIGH (ref 12.0–15.0)
Sodium: 146 mEq/L — ABNORMAL HIGH (ref 135–145)
TCO2: 22 mmol/L (ref 0–100)

## 2010-06-17 LAB — TSH: TSH: 3.043 u[IU]/mL (ref 0.350–4.500)

## 2010-06-17 LAB — LACTIC ACID, PLASMA: Lactic Acid, Venous: 1 mmol/L (ref 0.5–2.2)

## 2010-06-22 LAB — URINALYSIS, ROUTINE W REFLEX MICROSCOPIC
Bilirubin Urine: NEGATIVE
Hgb urine dipstick: NEGATIVE
Ketones, ur: NEGATIVE mg/dL
Protein, ur: NEGATIVE mg/dL
Urobilinogen, UA: 0.2 mg/dL (ref 0.0–1.0)

## 2010-06-22 LAB — COMPREHENSIVE METABOLIC PANEL
ALT: 47 U/L — ABNORMAL HIGH (ref 0–35)
AST: 49 U/L — ABNORMAL HIGH (ref 0–37)
CO2: 25 mEq/L (ref 19–32)
Chloride: 110 mEq/L (ref 96–112)
GFR calc Af Amer: >60 mL/min (ref >60)
GFR calc non Af Amer: >60 mL/min (ref >60)
Potassium: 3.7 mEq/L (ref 3.5–5.1)
Sodium: 145 mEq/L (ref 135–145)
Total Bilirubin: 0.8 mg/dL (ref 0.3–1.2)

## 2010-06-22 LAB — DIFFERENTIAL
Basophils Absolute: 0 10*3/uL (ref 0.0–0.1)
Eosinophils Absolute: 0 10*3/uL (ref 0.0–0.7)
Eosinophils Relative: 1 % (ref 0–5)
Lymphs Abs: 2.2 10*3/uL (ref 0.7–4.0)
Monocytes Absolute: 0.6 10*3/uL (ref 0.1–1.0)

## 2010-06-22 LAB — CBC
RBC: 3.89 MIL/uL (ref 3.87–5.11)
WBC: 4.8 10*3/uL (ref 4.0–10.5)

## 2010-06-24 LAB — DIFFERENTIAL
Basophils Relative: 1 % (ref 0–1)
Lymphs Abs: 2 10*3/uL (ref 0.7–4.0)
Monocytes Absolute: 0.9 10*3/uL (ref 0.1–1.0)
Monocytes Relative: 16 % — ABNORMAL HIGH (ref 3–12)
Neutro Abs: 2.7 10*3/uL (ref 1.7–7.7)

## 2010-06-24 LAB — POCT I-STAT, CHEM 8
BUN: 7 mg/dL (ref 6–23)
Chloride: 107 mEq/L (ref 96–112)
Creatinine, Ser: 1 mg/dL (ref 0.4–1.2)
Glucose, Bld: 77 mg/dL (ref 70–99)
Hemoglobin: 15 g/dL (ref 12.0–15.0)
Potassium: 3.5 mEq/L (ref 3.5–5.1)
Sodium: 140 mEq/L (ref 135–145)

## 2010-06-24 LAB — URINE CULTURE

## 2010-06-24 LAB — CBC
HCT: 40.2 % (ref 36.0–46.0)
MCHC: 34.2 g/dL (ref 30.0–36.0)
Platelets: 131 10*3/uL — ABNORMAL LOW (ref 150–400)
RDW: 14.2 % (ref 11.5–15.5)

## 2010-06-24 LAB — URINALYSIS, ROUTINE W REFLEX MICROSCOPIC
Glucose, UA: NEGATIVE mg/dL
Hgb urine dipstick: NEGATIVE
Ketones, ur: NEGATIVE mg/dL
Protein, ur: NEGATIVE mg/dL

## 2010-06-24 LAB — COMPREHENSIVE METABOLIC PANEL
Albumin: 4.5 g/dL (ref 3.5–5.2)
Alkaline Phosphatase: 84 U/L (ref 39–117)
BUN: 7 mg/dL (ref 6–23)
Calcium: 9.8 mg/dL (ref 8.4–10.5)
Potassium: 3.3 mEq/L — ABNORMAL LOW (ref 3.5–5.1)
Sodium: 134 mEq/L — ABNORMAL LOW (ref 135–145)
Total Protein: 8.1 g/dL (ref 6.0–8.3)

## 2010-06-28 LAB — COMPREHENSIVE METABOLIC PANEL
ALT: 16 U/L (ref 0–35)
ALT: 19 U/L (ref 0–35)
AST: 26 U/L (ref 0–37)
AST: 33 U/L (ref 0–37)
Alkaline Phosphatase: 76 U/L (ref 39–117)
Alkaline Phosphatase: 97 U/L (ref 39–117)
BUN: 5 mg/dL — ABNORMAL LOW (ref 6–23)
CO2: 26 mEq/L (ref 19–32)
CO2: 28 mEq/L (ref 19–32)
Calcium: 9.9 mg/dL (ref 8.4–10.5)
Chloride: 105 mEq/L (ref 96–112)
Creatinine, Ser: 0.82 mg/dL (ref 0.4–1.2)
Creatinine, Ser: 0.86 mg/dL (ref 0.4–1.2)
GFR calc Af Amer: >60 mL/min (ref >60)
GFR calc Af Amer: >60 mL/min (ref >60)
GFR calc non Af Amer: >60 mL/min (ref >60)
GFR calc non Af Amer: >60 mL/min (ref >60)
Glucose, Bld: 78 mg/dL (ref 70–99)
Glucose, Bld: 84 mg/dL (ref 70–99)
Potassium: 3.8 mEq/L (ref 3.5–5.1)
Potassium: 3.8 mEq/L (ref 3.5–5.1)
Sodium: 139 mEq/L (ref 135–145)
Sodium: 140 mEq/L (ref 135–145)
Total Bilirubin: 0.9 mg/dL (ref 0.3–1.2)
Total Bilirubin: 1 mg/dL (ref 0.3–1.2)
Total Protein: 7.4 g/dL (ref 6.0–8.3)
Total Protein: 7.7 g/dL (ref 6.0–8.3)

## 2010-06-28 LAB — RAPID URINE DRUG SCREEN, HOSP PERFORMED
Barbiturates: NOT DETECTED
Cocaine: NOT DETECTED
Opiates: POSITIVE — AB
Tetrahydrocannabinol: NOT DETECTED

## 2010-06-28 LAB — DIFFERENTIAL
Basophils Absolute: 0 10*3/uL (ref 0.0–0.1)
Basophils Relative: 0 % (ref 0–1)
Basophils Relative: 1 % (ref 0–1)
Eosinophils Absolute: 0 10*3/uL (ref 0.0–0.7)
Eosinophils Relative: 0 % (ref 0–5)
Lymphocytes Relative: 41 % (ref 12–46)
Lymphs Abs: 2.2 10*3/uL (ref 0.7–4.0)
Monocytes Relative: 11 % (ref 3–12)
Monocytes Relative: 11 % (ref 3–12)
Neutro Abs: 2.3 10*3/uL (ref 1.7–7.7)
Neutrophils Relative %: 48 % (ref 43–77)
Neutrophils Relative %: 49 % (ref 43–77)

## 2010-06-28 LAB — BASIC METABOLIC PANEL
BUN: 3 mg/dL — ABNORMAL LOW (ref 6–23)
Calcium: 10.1 mg/dL (ref 8.4–10.5)
Creatinine, Ser: 0.79 mg/dL (ref 0.4–1.2)
GFR calc non Af Amer: >60 mL/min (ref >60)
Glucose, Bld: 98 mg/dL (ref 70–99)

## 2010-06-28 LAB — CBC
HCT: 39 % (ref 36.0–46.0)
Hemoglobin: 11.9 g/dL — ABNORMAL LOW (ref 12.0–15.0)
Hemoglobin: 12.4 g/dL (ref 12.0–15.0)
Hemoglobin: 13.2 g/dL (ref 12.0–15.0)
MCHC: 33.8 g/dL (ref 30.0–36.0)
MCHC: 33.8 g/dL (ref 30.0–36.0)
MCV: 96.7 fL (ref 78.0–100.0)
MCV: 97.2 fL (ref 78.0–100.0)
RBC: 3.7 MIL/uL — ABNORMAL LOW (ref 3.87–5.11)
RBC: 3.79 MIL/uL — ABNORMAL LOW (ref 3.87–5.11)
RDW: 14.8 % (ref 11.5–15.5)
WBC: 4.4 10*3/uL (ref 4.0–10.5)

## 2010-06-28 LAB — URINE CULTURE
Colony Count: NO GROWTH
Culture: NO GROWTH

## 2010-06-28 LAB — WET PREP, GENITAL

## 2010-06-28 LAB — URINALYSIS, ROUTINE W REFLEX MICROSCOPIC
Bilirubin Urine: NEGATIVE
Glucose, UA: NEGATIVE mg/dL
Hgb urine dipstick: NEGATIVE
Nitrite: NEGATIVE
Protein, ur: NEGATIVE mg/dL
Specific Gravity, Urine: 1.016 (ref 1.005–1.030)
Specific Gravity, Urine: 1.036 — ABNORMAL HIGH (ref 1.005–1.030)
Urobilinogen, UA: 0.2 mg/dL (ref 0.0–1.0)
pH: 5.5 (ref 5.0–8.0)

## 2010-06-28 LAB — URINE MICROSCOPIC-ADD ON

## 2010-06-28 LAB — FECAL LACTOFERRIN, QUANT: Fecal Lactoferrin: POSITIVE

## 2010-06-28 LAB — GC/CHLAMYDIA PROBE AMP, GENITAL: GC Probe Amp, Genital: NEGATIVE

## 2010-06-29 ENCOUNTER — Emergency Department (HOSPITAL_COMMUNITY)
Admission: EM | Admit: 2010-06-29 | Discharge: 2010-06-29 | Disposition: A | Discharge status: Home / Self Care | Payer: Medicaid Other | Attending: Emergency Medicine | Admitting: Emergency Medicine

## 2010-06-29 DIAGNOSIS — IMO0001 Reserved for inherently not codable concepts without codable children: Secondary | ICD-10-CM | POA: Insufficient documentation

## 2010-06-29 DIAGNOSIS — M25519 Pain in unspecified shoulder: Secondary | ICD-10-CM | POA: Insufficient documentation

## 2010-06-29 DIAGNOSIS — F3289 Other specified depressive episodes: Secondary | ICD-10-CM | POA: Insufficient documentation

## 2010-06-29 DIAGNOSIS — F329 Major depressive disorder, single episode, unspecified: Secondary | ICD-10-CM | POA: Insufficient documentation

## 2010-06-29 DIAGNOSIS — R1013 Epigastric pain: Secondary | ICD-10-CM | POA: Insufficient documentation

## 2010-06-29 DIAGNOSIS — I10 Essential (primary) hypertension: Secondary | ICD-10-CM | POA: Insufficient documentation

## 2010-06-29 DIAGNOSIS — R112 Nausea with vomiting, unspecified: Secondary | ICD-10-CM | POA: Insufficient documentation

## 2010-06-29 LAB — URINALYSIS, ROUTINE W REFLEX MICROSCOPIC
Bilirubin Urine: NEGATIVE
Ketones, ur: NEGATIVE mg/dL
Nitrite: NEGATIVE
Urobilinogen, UA: 0.2 mg/dL (ref 0.0–1.0)

## 2010-06-29 LAB — CBC
MCHC: 34.3 g/dL (ref 30.0–36.0)
MCV: 92.7 fL (ref 78.0–100.0)
Platelets: 156 10*3/uL (ref 150–400)
RDW: 13.3 % (ref 11.5–15.5)
WBC: 6.6 10*3/uL (ref 4.0–10.5)

## 2010-06-29 LAB — RAPID URINE DRUG SCREEN, HOSP PERFORMED
Amphetamines: NOT DETECTED
Cocaine: NOT DETECTED
Opiates: POSITIVE — AB
Tetrahydrocannabinol: NOT DETECTED

## 2010-06-29 LAB — COMPREHENSIVE METABOLIC PANEL
Albumin: 4.1 g/dL (ref 3.5–5.2)
Alkaline Phosphatase: 62 U/L (ref 39–117)
BUN: 7 mg/dL (ref 6–23)
Calcium: 9.6 mg/dL (ref 8.4–10.5)
Creatinine, Ser: 0.77 mg/dL (ref 0.4–1.2)
Potassium: 3.2 mEq/L — ABNORMAL LOW (ref 3.5–5.1)
Total Protein: 7.6 g/dL (ref 6.0–8.3)

## 2010-06-29 LAB — URINE MICROSCOPIC-ADD ON

## 2010-06-29 LAB — LIPASE, BLOOD: Lipase: 32 U/L (ref 11–59)

## 2010-06-29 LAB — DIFFERENTIAL
Eosinophils Absolute: 0.1 10*3/uL (ref 0.0–0.7)
Eosinophils Relative: 2 % (ref 0–5)
Lymphs Abs: 2.5 10*3/uL (ref 0.7–4.0)

## 2010-06-30 LAB — URINE CULTURE: Culture  Setup Time: 201204172111

## 2010-07-20 ENCOUNTER — Emergency Department (HOSPITAL_COMMUNITY)
Admission: EM | Admit: 2010-07-20 | Discharge: 2010-07-20 | Disposition: A | Discharge status: Home / Self Care | Payer: Medicaid Other | Attending: Emergency Medicine | Admitting: Emergency Medicine

## 2010-07-20 DIAGNOSIS — M545 Low back pain, unspecified: Secondary | ICD-10-CM | POA: Insufficient documentation

## 2010-07-20 DIAGNOSIS — R110 Nausea: Secondary | ICD-10-CM | POA: Insufficient documentation

## 2010-07-20 DIAGNOSIS — H409 Unspecified glaucoma: Secondary | ICD-10-CM | POA: Insufficient documentation

## 2010-07-20 DIAGNOSIS — I10 Essential (primary) hypertension: Secondary | ICD-10-CM | POA: Insufficient documentation

## 2010-07-20 DIAGNOSIS — Z79899 Other long term (current) drug therapy: Secondary | ICD-10-CM | POA: Insufficient documentation

## 2010-07-20 DIAGNOSIS — M542 Cervicalgia: Secondary | ICD-10-CM | POA: Insufficient documentation

## 2010-07-20 DIAGNOSIS — F3289 Other specified depressive episodes: Secondary | ICD-10-CM | POA: Insufficient documentation

## 2010-07-20 DIAGNOSIS — M546 Pain in thoracic spine: Secondary | ICD-10-CM | POA: Insufficient documentation

## 2010-07-20 DIAGNOSIS — G8929 Other chronic pain: Secondary | ICD-10-CM | POA: Insufficient documentation

## 2010-07-20 DIAGNOSIS — M25519 Pain in unspecified shoulder: Secondary | ICD-10-CM | POA: Insufficient documentation

## 2010-07-20 DIAGNOSIS — F329 Major depressive disorder, single episode, unspecified: Secondary | ICD-10-CM | POA: Insufficient documentation

## 2010-07-20 LAB — URINE MICROSCOPIC-ADD ON

## 2010-07-20 LAB — URINALYSIS, ROUTINE W REFLEX MICROSCOPIC
Glucose, UA: NEGATIVE mg/dL
Hgb urine dipstick: NEGATIVE
Ketones, ur: NEGATIVE mg/dL
Protein, ur: NEGATIVE mg/dL
pH: 5.5 (ref 5.0–8.0)

## 2010-07-27 NOTE — H&P (Signed)
[name], [name]                ACCOUNT NO.:  [id number]   MEDICAL RECORD NO.:  [id number]          PATIENT TYPE:  INP   LOCATION:  6739                         FACILITY:  [facility]   PHYSICIAN:  Hettie Holstein, D.O.    DATE OF BIRTH:  09-03-1955   DATE OF ADMISSION:  11/16/2006  DATE OF DISCHARGE:                              HISTORY & PHYSICAL   PRIMARY CARE PHYSICIAN:  [facility].   CHIEF COMPLAINT:  Fevers, chills and productive cough as well as nausea  and vomiting.   HISTORY OF PRESENT ILLNESS:  [name] is a 55 year old, African-  American female who has a previous diagnosis of hepatitis C and  cirrhosis as well as chronic alcohol abuse and cocaine use who has seen  [facility] recently for office follow-up of chronic bile  duct and biliary dilatation status post cholecystectomy that they have  attributed to chronic pancreatitis. In any event, this morning she woke  up feeling badly. She states that she had a headache, fever and, some  nausea and vomiting. She was felt to have a urinary tract in the  emergency department and was given Cipro and Zithromax by Dr. Lynelle Doctor as  she is PENICILLIN allergic. Blood cultures have been drawn. Her symptoms  have subsided. She has receive some Toradol. She denies IV drug abuse  and she denies being surrounded by any sick contacts that she is aware  of. Her symptoms have subsided in the emergency department as described  above.   PAST MEDICAL HISTORY:  Significant for E.  Coli bacteremia in the past,  alcohol dependence, chronic pain, history of cocaine use, hepatitis C  with cirrhosis, status post appendectomy, cholecystectomy, hysterectomy,  history of tobacco abuse.  She had some blindness that she attributes to  a stroke involving her left eye, previous history of asthma as described  previously, chronic pancreatitis and hepatitis C.   MEDICATIONS:  [name] cannot recall any of the medications which she  takes at  home.  We will await reconciliation to reinitiate her home  medications.   ALLERGIES:  PENICILLIN.   REVIEW OF SYSTEMS:  She states that she has been in her usual state of  health.  No recurrent nausea, vomiting, diarrhea, blood in stools, no  dysuria. Under further review, this is unremarkable with the exception  of weight loss over the past several years. She claims to have lost  approximately 30 pounds over the past couple years though she denies  loss of appetite.   PHYSICAL EXAMINATION:  In the emergency department, her temperature is  104.4, blood pressure 113/65, heart rate 114, respirations 20 and O2  saturation 97% on room air.  HEENT:  Reveals the head to be normocephalic.  She exhibits no jaundice.  Oropharynx is clear.  NECK:  Supple, nontender. No palpable thyromegaly or mass.  CARDIOVASCULAR:  Exam reveals normal S1, S2.  LUNGS:  Clear though diminished in the bases.  She exhibits normal  effort.  There is no dullness to percussion.  ABDOMEN:  Soft and nontender.  No rebound or guarding.  LOWER EXTREMITIES:  Reveal no edema and peripheral pulses are  symmetrical and palpable.   LABORATORY DATA:  Sodium 135, potassium 3.2, BUN 12, creatinine 1.3,  glucose 107, AST/ALT 43/21, albumin 4.1.  Urine drug screen was positive  for cocaine.  Urinalysis revealed 3-6 WBCs.  White count was 15.8,  hemoglobin 13.1, platelet count 145, MCV of 96, total bili 1.5, direct  bili 0.4, alcohol less than 5.   CHEST X-RAY:  The preliminary reading on the chest x-ray was bronchitis  and bronchial thickening without consolidation or collapse.   ASSESSMENT:  1. Febrile illness, unclear etiology. Her urinalysis is not      convincing. Her chest x-ray reveals only bronchitis though she is      quite volume depleted clinically. Will repeat a chest x-ray in the      morning.  She has had blood cultures drawn and we will await these      results.  2. Dehydration.  3. Hepatitis C with  psoriasis.  4. Continued substance abuse and history of alcohol abuse and      dependency with her last alcohol intake on this past labor day.      Prior history of DTs in the past.  5. Hypokalemia.  6. Positive cocaine on urine drug screen this admission.   PLAN:  At this time, we are going to admit [name], await her blood  cultures and administer IV hydration. I suspect that she is volume  depleted. She may have an underlying pneumonia.  Will treat her  bronchitis symptomatically with mucolytics and will also initiate a  course of quinolone therapy at this time. Will follow clinical course.      Hettie Holstein, D.O.  Electronically Signed     ESS/MEDQ  D:  11/16/2006  T:  11/17/2006  Job:  [id number]   cc:   [facility]
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
## 2010-07-27 NOTE — Assessment & Plan Note (Signed)
[facility]                         GASTROENTEROLOGY OFFICE NOTE   DHALIA, ZINGARO                         MRN:          [id number]  DATE:11/01/2006                            DOB:          1955/06/10    PRIMARY CARE PHYSICIAN:  Dr. Emeline Darling at [facility].   GI PROBLEM LIST:  1. Reflux esophagitis seen during upper endoscopy by me, March 2008.      Was instructed to be on twice daily proton pump inhibitor.  2. Dilated pancreatic and bile duct, seen on imaging, March 2008, no      masses in her pancreas.  The patient is status post      cholecystectomy.  Recent liver tests show AST 111, ALT 46, total      bilirubin 1.1, alk phos 111, amylase and lipase were normal.  These      labs were done October 18, 2006 during an admission for alcoholic      intoxication.   INTERVAL HISTORY:  I last saw Ms. Sanderson March 2008.  We arranged for  her to meet with me in the office to discuss further workup of her  dilated bile duct and pancreatic duct, but she failed to show for 2  appointments.  She rescheduled, eventually, for today, and was 45  minutes late for this appointment as well.  Currently, I see in her  records that she has had several emergency room visits for acute alcohol  intoxication.  She is not a very clear historian.  She seems quite vague  about her medical conditions.  She does admit that she has rather  chronic abdominal discomfort, alternating constipation and diarrhea, she  does have rare gastroesophageal reflux disease symptoms.  She has not  been taking the recommended proton pump inhibitor.  She continues to  drink alcohol, at least one to two 40 oz beers daily, as well as wine  and possibly others.  Currently, she is denying other substance abuse.   CURRENT MEDICATIONS:  1. Sertraline.  2. Albuterol.   PHYSICAL EXAMINATION:  Height 5 feet 2 inches, 110 pounds, blood  pressure 130/74, pulse 90.  CONSTITUTIONAL:  Cachectic appearing,  somewhat confused, a little bit  tremulous.  ABDOMEN:  Soft, nontender, nondistended, normal bowel sounds.  EXTREMITIES:  No lower extremity edema.   ASSESSMENT/PLAN:  The patient is a 55 year old woman with alcoholism,  esophagitis, medical noncompliance, dilated pancreas and bile ducts.   She does have dilated pancreatic duct as well as dilated common bile  duct.  I suspect this is from chronic pancreatitis.  She does have  mildly abnormal liver function tests but much of this could be simply  from her alcoholism and alcoholic hepatitis.  I have recommended first  that she start back on a proton pump inhibitor as I recommended to her  several months ago.  We have given her samples for Protonix to take one  pill once a day 20 to 30 minutes prior to her breakfast meal on a daily  basis.  When that runs out she will start OTC Prilosec at the same  dosing.  I also recommend that we proceed with upper endoscopic  ultrasound at her soonest convenience.  This will need to be done with  Propofol sedation as she is an alcoholic with polysubstance abuse in her  history and I would be very wary to sedate her with simple moderate  sedation.  My goal would be to look at her pancreas and her bile ducts  to see if I could explain why they are dilated.  The patient had a  family member in with her who has indicated that she will make sure the  patient does show for that procedure.     Rachael Fee, MD  Electronically Signed    DPJ/MedQ  DD: 11/01/2006  DT: 11/02/2006  Job #: [id number]

## 2010-07-27 NOTE — Discharge Summary (Signed)
[name], [name] NO.:  [id number]   MEDICAL RECORD NO.:  [id number]          PATIENT TYPE:  INP   LOCATION:  3018                         FACILITY:  [facility]   PHYSICIAN:  Eduard Clos, MDDATE OF BIRTH:  1956/02/21   DATE OF ADMISSION:  04/02/2008  DATE OF DISCHARGE:  04/07/2008                               DISCHARGE SUMMARY   COURSE IN THE HOSPITAL:  A 55 year old female with known history of  chronic pancreatitis and history of alcoholism __________ depression,  question of a __________ presented to the __________.  Once the  patient's pain was getting better, the patient was __________ pain.  Diet was __________.  The patient had an MRI __________ which showed  __________ with no evidence of cholelithiasis __________ GI consult with  Dr. Christella Hartigan, who had __________.  At this time __________ colonoscopy.  The patient __________.  The patient was hemodynamically stable for  discharge home .  __________ stable.  __________.  __________ abdomen  and pelvis.      Eduard Clos, MD     ANK/MEDQ  D:  04/07/2008  T:  04/08/2008  Job:  [id number]

## 2010-07-27 NOTE — H&P (Signed)
NAMEGRACIE, [name] NO.:  [id number]   MEDICAL RECORD NO.:  [id number]          PATIENT TYPE:  INP   LOCATION:  3018                         FACILITY:  [facility]   PHYSICIAN:  Lonia Blood, M.D.DATE OF BIRTH:  1955-03-29   DATE OF ADMISSION:  04/02/2008  DATE OF DISCHARGE:                              HISTORY & PHYSICAL   PRIMARY CARE PHYSICIAN:  [facility].   CHIEF COMPLAINT:  Severe left lower quadrant and right lower quadrant  abdominal pain.   HISTORY OF PRESENT ILLNESS:  [name] is a 55 year old female  with a long-standing history of chronic medical problems.  She presented  to [facility] Emergency Room tonight with complaints of severe  generalized abdominal pain.  She states this pain has been present since  New Years.  She denies any recent use of alcohol.  She describes the  pain as crampy.  She rates it as a 10/10.  It is localized primarily in  left lower quadrant, but at times radiates across to the right lower  quadrant.  She reports watery diarrhea accompanying this pain.  It has  also been associated with nausea and a few episodes of nonbloody  hematemesis.  There has been no melena or hematochezia.  The patient's  intake has been significantly limited by these symptoms.  She has not  had any recent sick contacts and has not traveled to foreign countries  of late.   REVIEW OF SYSTEMS:  Current review of systems is otherwise unremarkable  with the exception to multiple problems noted in the history of present  illness above.   PAST MEDICAL HISTORY:  1. Long-standing history of alcoholism.  The patient reports      abstinence since March 14, 2008.  2. History of depression with psychotic features.  3. Blind in the left eye.  4. Glaucoma.  5. Hepatitis C.  6. Chronic back pain.  7. Status post cholecystectomy.  8. Status post appendectomy.  9. Status post hysterectomy.  10.Remote history of cocaine abuse.  11.Tobacco  abuse.   OUTPATIENT MEDICATIONS:  1. Zyprexa 5 mg q.h.s.  2. Ibuprofen 800 mg t.i.d. p.r.n.  3. Zoloft 50 mg daily.  4. Multivitamin daily.  5. Trazodone 100 mg q.h.s.  6. Hydroxyzine 25 mg p.r.n.  7. Xalatan opthalmic solution, dose unclear.   ALLERGIES:  PENICILLIN.   FAMILY HISTORY:  Noncontributory.   SOCIAL HISTORY:  The patient is a widow.  She has no children.  She is  disabled due to her blindness.   DATA REVIEWED:  Hemoglobin 12.4 with MCV of 97, white count and platelet  count are normal.  C-met is unremarkable with an albumin of 4.1, lipase  26.  Urinalysis reveals moderate leukocyte esterase, 3-6 white blood  cells.  CT scan of the abdomen and pelvis reveals biliary and mild  pancreatic ductal dilatation without evidence of specific etiology and  distal colonic wall thickening without abscess.   PHYSICAL EXAMINATION:  VITAL SIGNS:  Temperature 97.4, blood pressure  110/70, heart rate 104, respirations 20, O2 saturation 99% on room air.  GENERAL:  Poorly nourished, thin, frail-looking female who appears older  than stated age but is in no acute respiratory distress.  HEENT:  Edentulous, normocephalic, atraumatic.  Oral cavity is marked by  dry mucous membranes.  NECK:  No JVD.  LUNGS:  Clear to auscultation bilaterally without wheeze or rhonchi.  CARDIOVASCULAR:  Regular rate and rhythm without murmurs, gallops, rubs,  normal S1, S2.  ABDOMEN:  Thin, soft.  Bowel sounds are present but hypoactive.  The  patient is tender to deep palpation in the left lower quadrant, worse in  the right lower quadrant.  There is no rebound.  There is no appreciable  mass.  The abdomen is soft.  EXTREMITIES:  No significant cyanosis, clubbing, edema to bilateral  lower extremities.  NEUROLOGICAL:  Unrevealing/nonfocal.   IMPRESSION/PLAN:  1. Nonspecific colitis.  We will send stool for fecal lactoferrin as      well as routine culture.  We will empirically initiate Flagyl       therapy as well as Rocephin for a possible bacterial colitis.  This      is offered simply to shorten the course of the disease should it be      bacterial.  If it were not for the combined indication for Rocephin      therapy, I probably would not dose antibiotics at the present time.      We will supplement the patient's oral intake with IV fluid.  The      length of hospital stay will be determined by the hospital course      on clinical follow up.  2. Biliary and pancreatic ductal dilatation.  The patient actually has      two different medical record numbers.  When one searches under her      other medical record number it becomes apparent that this is      actually a chronic issue for this patient.  She was scheduled for      endoscopic ultrasound evaluation per [facility] in late 2008, but      states that she did not show up for that evaluation.  I am      comfortable, however, that there is no evidence of pancreatic or      biliary mass on today's CT scan and that the ductal dilatation is      still present.  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  I will suggest      to her that she reconnect herself with her gastroenterologist at      the time of her discharge and consider further evaluation as      indicated.  If the patient's symptoms do not improve during her      hospital stay, we will consider evaluation acutely, though, it does      not appear to be clinically indicated at this time.  3. Alcohol abuse.  I have congratulated the patient on her abstinence.      We will empirically place her on thiamine folate.  There should be      no risk whatsoever withdrawal if she has truly been abstaining it      for this period of time.  4. Urinary tract infection.  Urinalysis is consistent with a urinary      tract infection.  Pelvic exam was carried out by the EDP and was      unrevealing per her  report.  We will simply  place the patient on      Rocephin and we will check a urine culture.      Lonia Blood, M.D.  Electronically Signed     JTM/MEDQ  D:  04/02/2008  T:  04/03/2008  Job:  [id number]

## 2010-07-27 NOTE — Discharge Summary (Signed)
[name], [name]                ACCOUNT NO.:  [id number]   MEDICAL RECORD NO.:  [id number]          PATIENT TYPE:  INP   LOCATION:  3018                         FACILITY:  [facility]   PHYSICIAN:  Eduard Clos, MDDATE OF BIRTH:  [birth year]   DATE OF ADMISSION:  04/02/2008  DATE OF DISCHARGE:  04/07/2008                               DISCHARGE SUMMARY   COURSE IN THE HOSPITAL:  A 55 year old female with known history of  hepatitis C, history of alcoholism, quit two weeks ago, depression,  presented with complaints of abdominal pain particularly in left lower  quadrant.  CT scan of the abdomen at that time which showed biliary and  mild pancreatic ductal dilatation, which was chronic along with some  thickening of the left colon.  The patient was initially admitted to  medical floor with n.p.o. gradually increased diet.  The patient started  getting more pain when her diet was advanced, so the patient was made  clear liquid diet again and MRI of the abdomen was ordered which showed  moderate biliary dilatation to the level of the ampulla with mild  dilatation of main pancreatic duct with no mass or obstructive lesion.  GI consult was obtained with Dr. Christella Hartigan who advised that the patient has  already had an endoscopic ultrasound a year and half ago which only  showed features of chronic pancreatitis and advised to continue the  patient on Flagyl.  If her diarrhea persists, will need colonoscopy.  At  this time, as the patient's diet has improved, tolerating 100% diet, we  will discharge the patient home.  We have advised to follow up with  primary care and will need colonoscopy within 2 months as outpatient.  The patient was treated for possible UTI, cultures came negative.  At  time of dictation, the patient is hemodynamically stable.   PROCEDURE DURING THE STAY:  1. CT of abdomen and pelvis on April 02, 2008, shows new biliary and      mild pancreatic ductal dilatation, status  post cholecystectomy.  No      specific etiology is demonstrated by this examination, correlation      with liver function tests were recommended.  Differential      consideration include a small obstructing calculus, distal      strictures, ampullary mass, for further evaluation and possible      gastric wall thickening with incomplete distention otherwise stable      abdominal exam.  CT pelvis shows distal colonic wall thickening,      may be related to incomplete distention or minor colitis.  No      definite diverticula or signs of pelvic abscess identified.  2. Acute abdominal series on April 04, 2008, shows stable chest, no      evidence of acute cardiopulmonary disease, nonobstructive bowel gas      pattern.  3. MRI of the abdomen on April 04, 2008, shows prior cholecystectomy      and moderate biliary dilatation to the level of the ampulla with      mild dilatation of main pancreatic duct.  No evidence of      choledocholithiasis, pancreatic mass, or obstructing etiology,      ampullary stenosis or small ampullary mass cannot be excluded by      MRCP.  Recommend correlation with liver function test and consider      ERCP for direct visualization of the ampulla is clinically      warranted.   FINAL DIAGNOSES:  1. Colitis with diarrhea.  2. Dehydration.  3. Possible urinary tract infection.  4. History of chronic alcoholism, quit now 2 weeks ago.  5. History of depression.  No suicidal intentions or ideas.   MEDICATIONS AT DISCHARGE:  1. Zyprexa 5 mg p.o. at bedtime.  2. Sertraline 50 mg p.o. daily.  3. Multivitamin 1 tablet p.o. daily.  4. Trazodone 100 mg p.o. at bedtime.  5. Hydralazine 25 mg p.o. p.r.n. q.6 h.  6. Xalatan ophthalmic drops, one drop both eyes twice a day.  7. Flagyl 500 mg p.o. t.i.d. for 10 days.  8. Creon 10, two capsules before meals t.i.d.  9. Oxycodone 5 mg p.o. q.6 h. p.r.n. for pain.   PLAN:  The patient advised to follow up with primary  care physician for  which I have given Dr. Mikeal Hawthorne contact number as the patient has no  specific PCP.  The patient agreed to follow up with Dr. Mikeal Hawthorne within a  week's time.  Recheck BMET and CBC at that time, to have a colonoscopy  done within 2 months after this current __________ is over, to be on a  regular diet.      Eduard Clos, MD  Electronically Signed     ANK/MEDQ  D:  04/07/2008  T:  04/08/2008  Job:  [id number] [id number]
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
## 2010-07-27 NOTE — Assessment & Plan Note (Signed)
[facility]                         GASTROENTEROLOGY OFFICE NOTE   ANUOLUWAPO, MEFFERD                         MRN:          [id number]  DATE:11/01/2006                            DOB:          11/25/1955    ADDENDUM   ASSESSMENT/PLAN:  The patient is a 55 year old woman with alcoholism,  esophagitis, medical noncompliance, dilated pancreas and bile ducts.   She does have dilated pancreatic duct as well as dilated common bile  duct.  I suspect this is from chronic pancreatitis.  She does have  mildly abnormal liver function tests but much of this could be simply  from her alcoholism and alcoholic hepatitis.  I have recommended first  that she start back on a proton pump inhibitor as I recommended to her  several months ago.  We have given her samples for Protonix to take one  pill once a day 20 to 30 minutes prior to her breakfast meal on a daily  basis.  When that runs out she will start OTC Prilosec at the same  dosing.  I also recommend that we proceed with upper endoscopic  ultrasound at her soonest convenience.  This will need to be done with  Propofol sedation as she is an alcoholic with polysubstance abuse in her  history and I would be very wary to sedate her with simple conscious  sedation.  My goal would be to look at her pancreas and her bile ducts  to see if I could explain why they are dilated.  The patient had a  family member in with her who has indicated that she will make sure the  patient does show for that procedure.     Rachael Fee, MD     DPJ/MedQ  DD: 11/01/2006  DT: 11/02/2006  Job #: [id number]   cc:   Dr. Emeline Darling, [facility]

## 2010-07-28 ENCOUNTER — Ambulatory Visit
Admission: RE | Admit: 2010-07-28 | Discharge: 2010-07-28 | Disposition: A | Discharge status: Home / Self Care | Payer: Medicaid Other | Source: Ambulatory Visit | Attending: *Deleted | Admitting: *Deleted

## 2010-07-28 ENCOUNTER — Other Ambulatory Visit: Payer: Self-pay | Admitting: *Deleted

## 2010-07-28 DIAGNOSIS — M1990 Unspecified osteoarthritis, unspecified site: Secondary | ICD-10-CM

## 2010-07-30 NOTE — Discharge Summary (Signed)
[name], [name]                ACCOUNT NO.:  [id number]   MEDICAL RECORD NO.:  [id number]          PATIENT TYPE:  INP   LOCATION:  6703                         FACILITY:  [facility]   PHYSICIAN:  Ileana Roup, M.D.  DATE OF BIRTH:  May 17, 1955   DATE OF ADMISSION:  05/22/2006  DATE OF DISCHARGE:  05/26/2006                               DISCHARGE SUMMARY   DIAGNOSES:  1. Hematemesis, likely due to reflux esophagitis as diagnosed on      esophagogastroduodenoscopy during this admission by Dr. Christella Hartigan.  2. Possible history of Mallory-Weiss tear, now healed.  3. Thrombocytopenia  4. Asthma.  5. Hypertension.  6. 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  7. Status post hysterectomy for bleeding.  8. Chronic alcohol abuse.  9. Weakly positive ANA titer of 1:40   DISCHARGE MEDICATIONS:  1. Zoloft 50 mg p.o. daily.  2. Thiamine 100 mg p.o. daily.  3. Folic acid 1 mg p.o. daily.  4. Albuterol MDI 1-2 puffs q.6h. p.r.n. shortness of breath.   FOLLOW-UP APPOINTMENTS:  1. The patient is to follow-up with Dr. Emeline Darling at [facility] on [facility].  This is the patient's primary care Careem Yasui.  She      will be seen Friday, March21 at 10:00 a.m.  2. Rob Bunting, [facility], [facility].  The patient will be seen      April15,2008 at 8:30 a.m.  3. The patient is to be scheduled with hepatitis clinic; the patient      could not be scheduled at the time of discharge due to the fact      that the hepatitis clinic was closed at that time.  Will defer to      primary care physician; if they feel that the patient still      requires the support of the hepatitis clinic, please refer the      patient there.   THINGS TO DO ON FOLLOW-UP:  We discontinued the patient's Protonix  during this admission as she did have a low platelet count, and we are  concerned that there is a very small risk that Protonix can cause  thrombocytopenia.  However, Prilosec, another PPI, does not have any  side effects of thrombocytopenia, and we felt that the patient would be  more appropriately managed given her reflux esophagitis on Prilosec.  I  attempted to call the patient when she left the hospital; however, her  telephone number that she had left was no longer working.  On follow-up  appointment on March21, please add an alternative PPI; Prilosec may  be the most beneficial given it does not have the side effect of  thrombocytopenia.   Also in follow up, please check if the patient has decreased any of her  alcohol use.  You may wish to refer her to AA.  We did offer her  counseling during this admission.  Also, please follow-up a CBC as the  patient did have several low platelet counts during this  admission;  please check that it has risen.  Also, please verify the patient has not  had any more hematemesis.   PROCEDURES PERFORMED DURING THIS ADMISSION:  X-ray of the chest & abdomen 05/22/2006:  No active cardiopulmonary  disease.  The nipple shadows simulate nodules as before.  Abdominal gas  pattern was consistent with an adynamic ileus.  No acute findings.   Ultrasound of the abdomen 05/22/2006: Status post cholecystectomy,  dilated common bile duct, correlate with LFTs, suspicion for diffuse  hepatocellular disease.   CT of abdomen 05/23/06: No acute findings of the abdomen,  diffuse mild  dilatation of the main pancreatic duct is noted.  No pancreatic head  mass is apparent.  The common bile duct is distended which is not  uncommon after cholecystectomy.  There is no evidence for intrahepatic  biliary dilatation.  Correlation with serum chemistry analysis may prove  helpful.  A 2-mm left lower lobe pulmonary nodule; if the patient has no  history of smoking malignancy, follow-up CT of the chest without  contrast in 6-12 months could be used to ensure stability.  In the  setting of a cancer or smoking  history, 3 month CT followup is  recommended.   CT of the pelvis 05/23/06:  No acute findings in the anatomic pelvis.   2-D echocardiogram 05/23/06 showed overall left ventricular systolic  function was normal.  Left ventricular ejection fraction was estimated  to range between 55-65%.  Although no diagnostic left ventricular  regional wall motion abnormality is identified, this possibility cannot  be completely excluded on the basis of this study.  Left ventricular  diastolic function parameters were normal.   EGD done 05/23/06 showed reflux esophagitis, this is the likely source of  her limited hematemesis.  May have a Mallory-Weiss tear that has since  healed.  Recommendation is PPI twice daily.   CONSULTATIONS:  Dr. Rachael Fee of [facility].   BRIEF ADMITTING HISTORY AND PHYSICAL:  This is a 55 year old female who  presented in the [facility] emergency department with the complaint of  vomiting for the past 2 days.  Up until 2 days prior to admission, she  was in her normal state of health but started vomiting Sunday a.m. and  could not tolerate anything by mouth.  The patient vomited approximately  2 cups of material each vomitus, and she was vomiting approximately 4  times a day.  On the morning of admission, the vomitus turned dark and  thick; however, there was no coffee-ground appearance.  The patient  believed she had raspberry-colored chunks.  There was no bright red  blood.  No aspirin or Tylenol were taken recently.  No history of peptic  ulcer disease or varices but did have EGD last year.  The patient had  also a history of diarrhea for 3 days prior to admission with loose  stools 3 to 4 times a day which were described as dark with possible  blood.  This is not her first episode.  The patient endorsed epigastric  pain for 2 days prior to admission which she also gets on a regular basis.  She denies any heartburn.  She endorsed dysphagia which is  stable and occurs  approximately twice a week since 1999.  The patient  also complained of feeling weak and dizzy; she denied chills, and  reported a temperature of 101 greater than 1 week prior to admission.   ALLERGIES:  No known drug allergies.   PHYSICAL  EXAMINATION:  Temperature 97.8, blood pressure 127/78, pulse  105, respiratory 16, saturating 96% on room air.  GENERAL:  No acute distress.  EYES:  Pupils equally round and reactive to light and accommodation.  EOMI.  No icterus.  ENT:  Oropharynx is clear with moist venous  membranes.  NECK:  Supple without any lymphadenopathy or carotid bruits.  RESPIRATORY:  Clear to auscultation bilaterally with good air movement.  CARDIOVASCULAR:  Regular rate and rhythm with no murmurs, rubs or  gallops  GASTROINTESTINAL:  Lower midline and laparotomy scars.  Bowel sounds are  positive.  Mild distension and soft.  Tenderness to palpation in the  epigastrium and right upper quadrant with rebound tenderness in the  right upper quadrant but no guarding.  EXTREMITIES:  No edema, cyanosis or clubbing.  Good pulses.  GENITOURINARY/RECTAL:  There are skin tags.  Anal tone normal with no  stool in the vault.  No masses.  LYMPH:  No inguinal, cervical or other nodes palpated.  NEUROLOGICAL:  Alert, oriented x3 out of 3.  Cranial nerves II-XII fully  intact.  Sensation to light touch and strength intact.  Finger-to-nose  intact.  No tremors.   ADMISSION LABORATORY DATA:  Sodium 135, potassium 3.6, chloride 100,  bicarb 25, BUN 6, creatinine 0.6, glucose 71.  White count 5.5,  hemoglobin 13.8, platelets 84, RDW of 15.1, MCV 92.9.  Lipase 25.  PT is  15.3, INR is 1.2.  FOBT was positive.  Alcohol equals 100.  UDS:  Cocaine positive, opiates positive.  Total bilirubin 1.8, with direct  0.8 and indirect 1.0; alkaline phosphatase was 167, AST 896, ALT 565,  protein 7.5, albumin 3.8, calcium 8.2, magnesium 1.6.   HOSPITAL COURSE BY PROBLEM:  #1.  Hematemesis.  Initially,  we were  concerned for a major GI bleed in this patient.  We admitted the  patient, placed IVs, began hydration, made her on n.p.o., gave Protonix  IV, and called a GI consult.  GI proceeded with an EGD on  March11,2008, which showed reflux esophagitis; Clotest for  Helicobacter pylori was negative.  The patient did not have any  hematemesis after her admission.  We never did have to transfuse the  patient as she remained stable during this admission; hemoglobin ranged  between 13.8 on admission and 10.6 on 05/23/06, and was 11.1 on day of  discharge.  It should be noticed the patient does have a long history of  alcohol abuse as well as liver dysfunction which may have contributed to  the hematemesis.  We did not send the patient home on Protonix because  of the concerm that it had contributed to her thrombocytopenia, and the patient already had low platelets.  However, when the patient is seen by  her primary care Thayer Inabinet, they could add Prilosec to her regimen as a  PPI as it has a lower risk of thrombocytopenia.  I tried to call the  patient after discharge and call in a prescription for her; however, her  home number was not working when I called.   #2.  Elevated liver enzymes and bilirubin.  Initially given the fraction  of ALT being greater than AST in this patient, we considered a viral  hepatitis.  The hepatitis C antibody come back positive.  Also, the  patient had a total hepatitis B core antibody IgG come back positive,  while the hepatitis B core antibody IgM was negative, and hepatitis B  surface antigen was negative. Therefore, the patient  may have had past  hepatitis B infection; a hepatitis B surface antibody was pending at the  time of discharge.  By discharge, total bilirubin was 1.1, AST 109, ALT  144, and alkaline phosphatase 132.  Also, the patient's liver function  tests may have been elevated secondary to an alcohol and crack cocaine  binge, with crack cocaine  which could have caused an ischemic event to  her liver and transiently elevated her LFTs.   #3.  Alcohol and cocaine abuse.  The patient was referred for counseling  services with AA and narcotics anonymous.   #4.  Thrombocytopenia.  Platelet count was 84 on admission,  and fell to  47 on 05/24/06; because of concern that Protonix may have provoked the  drop in platelets, Protonix was stopped on 05/24/06.  The patient's  platelets began to recover throughout the course of her stay, to 63 by  discharge.  As per primary care Adela Esteban, please consider adding  Prilosec to her regimen.   #5.  Hepatitis C antibody positive.  As above, liver enzymes improved  during hospitalization.  Further workup with viral load, and referral to  hepatitis C clinic, were deferred to primary MD.  Abstinence from  alcohol is a priority.   #6.  ANA positive.  ANA titer was weakly psoitive, with a titer of 1:40,  speckled pattern; the clinical significance of this is doubtful.   DISCHARGE LABORATORY DATA AND VITALS:  On 05/25/06, temperature 98.3,  pulse 76, respiratory 20, blood pressure 131/64, saturating 98% on room  air.  White count 5.2, hemoglobin 11.7, hematocrit 34.4, platelets 57.  Sodium 144, potassium 3.5, chloride 104, CO2 32, glucose 94, BUN less 1,  creatinine 0.70.  Alk phos 175, AST 216, ALT 238, total protein 7.0,  albumin 3.4, calcium 9.2.  ANA is positive, 1:40 titer with speckled  appearance.      Thereasa Solo, M.D.  Electronically Signed      Ileana Roup, M.D.  Electronically Signed    AS/MEDQ  D:  05/29/2006  T:  05/30/2006  Job:  [id number]   cc:   Rachael Fee, MD
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
## 2010-07-30 NOTE — Discharge Summary (Signed)
[name], [name]                ACCOUNT NO.:  [id number]   MEDICAL RECORD NO.:  [id number]          PATIENT TYPE:  INP   LOCATION:  5501                         FACILITY:  [facility]   PHYSICIAN:  Wilson Singer, M.D.DATE OF BIRTH:  01-09-1956   DATE OF ADMISSION:  06/30/2006  DATE OF DISCHARGE:  07/06/2006                               DISCHARGE SUMMARY   ADDENDUM   DISCHARGE DISPOSITION:  Home.   FINAL DISCHARGE DIAGNOSES:  1. Pyelonephritis.  2. E. coli bacteremia.  3. Chronic pain.  4. Generalized weakness.  5. History of alcohol dependence.  6. Past history of cocaine abuse.  Cocaine positive on this admission.   DISCHARGE MEDICATIONS:  1. Ciprofloxacin 5 mg b.i.d. for 11 days.  2. Zoloft 50 mg q. day.  3. Thymine 100 mg q. day.  4. Folate 100 mg q. Day.  5. Tylenol #3 one tablet every 6 hours p.r.n.   HISTORY:  This lady was admitted and found to have a urinary tract  infection and also had a E. coli bacteremia.  Please see discharge  summary by Dr. Marthann Schiller.   HOSPITAL PROGRESS:  Since the discharge summary done by Dr. Marthann Schiller there has really not been any significant change in her  condition and she has continued to improve.  She has tolerated physical  therapy while in the hospital.  She has not been orthostatic.  Consistently has a low blood pressure of around 90-100 systolic.  She  does not feel dizzy with this.  The physical therapy people have  recommended that she get home health physical therapy and a rolling  walker which I have ordered today.  Also she needs to find a primary  care physician and I will ask the social workers to try and facilitate  this.      Wilson Singer, M.D.  Electronically Signed     NCG/MEDQ  D:  07/06/2006  T:  07/06/2006  Job:  [id number]
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
## 2010-07-30 NOTE — H&P (Signed)
NAME:  [name], TOROK                ACCOUNT NO.:  [id number]   MEDICAL RECORD NO.:  [id number]          PATIENT TYPE:  EMS   LOCATION:  MAJO                         FACILITY:  [facility]   PHYSICIAN:  Mobolaji B. Bakare, M.D.DATE OF BIRTH:  06-20-1955   DATE OF ADMISSION:  06/29/2006  DATE OF DISCHARGE:                              HISTORY & PHYSICAL   PRIMARY CARE PHYSICIAN:  Unassigned.   CHIEF COMPLAINT:  Left flank pain.   HISTORY OF THE PRESENTING COMPLAINT:  [name] is a 55 year old  African American female with a history of alcohol abuse, presenting with  left flank pain, fever, and chills which started yesterday; there is  associated nausea and vomiting without diarrhea.  Pain is constant and  nonradiating.  She denies hematuria or dysuria, but she has been having  increased frequency of micturition.  She had an abdominal x-ray which  was unremarkable.  Urinalysis was strongly positive for urinary tract  infection.  The patient has received IV ciprofloxacin in the emergency  room.  She will be admitted for further management.   REVIEW OF SYSTEMS:  She denies cough, chest pain or shortness of breath.  No headache.  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  No diarrhea.   PAST MEDICAL HISTORY:  1. Hepatitis C infection with liver cirrhosis.  2. Alcohol abuse.  3. Tobacco abuse.  4. 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   PAST SURGICAL HISTORY:  1. Appendectomy.  2. Cholecystectomy.  3. Hysterectomy.   CURRENT MEDICATIONS:  Folic acid, promethazine, Zoloft.   ALLERGIES:  PENICILLIN.   FAMILY HISTORY:  There is no family history of colon cancer.   SOCIAL HISTORY:  She is a widow.  She has no children.  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   PHYSICAL EXAMINATION:  INITIAL VITALS:  Temperature 103.5, blood  pressure of 126/74, pulse of 97%, respiratory rate of 22, O2 SATs of  97%.  GENERAL:  On examination, the patient is  uncomfortable, not in  respiratory distress.  HEENT:  Normocephalic, atraumatic head.  No pallor.  Anicteric.  LUNGS:  Clear clinically to auscultation.  CVS:  S1 and S2, regular.  ABDOMEN:  Not distended.  Soft.  There is tenderness in the left lumbar  and lower quadrant.  No rebound or guarding.  Bowel sounds present.  EXTREMITIES:  No pedal edema or calf tenderness.  Dorsalis pedis pulses  palpable.  CNS:  No focal neurologic deficits.   INITIAL LABORATORY DATA:  White cell count 9.7, hemoglobin 11.8,  hematocrit 34.6, MCV 95, platelets 84,000.  Sodium 133, potassium 2.5,  chloride 100, bicarb 24, glucose 103, BUN 3, creatinine 1.0, AST 30, ALT  21, total protein 7.6, albumin 4.0, calcium 8.8, bilirubin 1.4, lipase  29.  Urinalysis positive for nitrites, large leukocytes, cloudy in  appearance; microscopy, 21-50 white cells, many bacteria.   ASSESSMENT AND PLAN:  [name] is a 55 year old African American  female presenting with fever and chills and left flank pain.  She has  pyuria on urinalysis.  Her white cell count is normal.  Temperature of  103.5.  She will be admitted for acute pyelonephritis.   ADMISSION DIAGNOSES:  1. Left acute pyelonephritis:  Urine culture has been sent.  We will      obtain a CT scan of abdomen and pelvis without contrast to rule out      kidney stones and also assess for perinephric abscess, in view of      the renal angle tenderness.  Continue with intravenous      ciprofloxacin 400 mg q.12 h.  2. Alcohol abuse:  We will start Ativan withdrawal protocol, check      alcohol level and urine drug screen.  3. Thrombocytopenia secondary to alcohol abuse.  4. Hepatitis C infection:  She has normal liver function tests at this      point.  5. History of liver cirrhosis:  This appears to be compensated without      ascites.  6. Tobacco abuse:  We will offer tobacco cessation counseling and give      nicotine patch.  7. Hypokalemia:  This has been  repleted in the emergency room.  We      will continue to replete potassium in intravenous fluid.      Mobolaji B. Corky Downs, M.D.  Electronically Signed     MBB/MEDQ  D:  06/30/2006  T:  06/30/2006  Job:  [id number]

## 2010-07-30 NOTE — Discharge Summary (Signed)
[name], [name]                ACCOUNT NO.:  [id number]   MEDICAL RECORD NO.:  [id number]          PATIENT TYPE:  INP   LOCATION:  5501                         FACILITY:  [facility]   PHYSICIAN:  Altha Harm, MDDATE OF BIRTH:  1955/10/19   DATE OF ADMISSION:  06/30/2006  DATE OF DISCHARGE:                               DISCHARGE SUMMARY   CURRENT DIAGNOSES:  1. Pyelonephritis Escherichia coli.  2. History of alcohol dependency.  3. Chronic pain.  4. Orthostasis.  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  6. Failure to thrive.   DISCHARGE MEDICATIONS:  To be determined at time of discharge.   DICTATION ENDED AT THIS POINT      Altha Harm, MD     MAM/MEDQ  D:  07/04/2006  T:  07/05/2006  Job:  [id number]

## 2010-07-30 NOTE — Discharge Summary (Signed)
NAMEYARELIN, REICHARDT                ACCOUNT NO.:  [id number]   MEDICAL RECORD NO.:  [id number]          PATIENT TYPE:  INP   LOCATION:  5501                         FACILITY:  [facility]   PHYSICIAN:  Altha Harm, MDDATE OF BIRTH:  1956-02-02   DATE OF ADMISSION:  06/30/2006  DATE OF DISCHARGE:  07/03/2006                               DISCHARGE SUMMARY   DISCHARGE DISPOSITION:  Home.   FINAL DISCHARGE DIAGNOSES:  1. Pyelonephritis.  2. Escherichia coli bacteremia.  3. Chronic pain.  4. Generalized weakness.  5. History of alcohol dependence.  6. Past history of cocaine abuse, cocaine positive on this admission.   DISCHARGE MEDICATIONS:  1. Ciprofloxacin 500 mg p.o. b.i.d. x11 days.  2. Zoloft 50 mg p.o. daily.  3. Thiamine 100 mg p.o. daily.  4. Folate 1 mg p.o. daily.  5. Tylenol #3 one tablet every 6 hours p.o. p.r.n.   CONSULTATIONS:  None.   PROCEDURE:  None.   DIAGNOSTIC STUDIES:  CT abdomen and pelvis with and without contrast  shows no acute findings in the pelvis and a striated nephrogram in the  upper pole of the left kidney consistent with pyelonephritis.  No renal  stones or urinary tract obstruction noted.  Chest x-ray that shows no  acute findings of the chest or abdomen.   CODE STATUS:  Full code.   DIETARY RESTRICTIONS:  None.   PHYSICAL RESTRICTIONS:  None.   PRIMARY CARE PHYSICIAN:  The patient is currently unassigned.   CHIEF COMPLAINT:  Left flank pain.   HISTORY OF PRESENT ILLNESS:  Please see the H&P for details of the HPI.   HOSPITAL COURSE:  1. The patient is admitted and found to have a urinary tract infection      with flank pain.  A CT abdomen and pelvis were performed, which      confirmed the diagnosis of pyelonephritis.  The patient is started      on IV ciprofloxacin and urine and blood cultures sent off.  The      patient's cultures reveal Escherichia coli sensitive to      ciprofloxacin.  She is to be continued for a total of  14 days.      This has been entered on discharge on the patient.  The patient is      still having some periods of hypertension and is still dependent on      IV fluids to maintain her blood pressure.  In light of this, the      patient is being held in the hospital until she is able to maintain      her blood pressures without IV support.  I expect that the patient      will be ready for discharge in the next 1 to 2 days.  Currently the      patient is on p.o. ciprofloxacin and I will switch her back to IV      ciprofloxacin for better penetration at this time given her      bacteriemia and her hypertension.  2. History of alcohol and substance  abuse.  The patient is currently      an Ativan protocol p.r.n.  However, has not required much Ativan to      maintain her orientation in sensorium.  The patient is also being      maintained on thymine and folate for prophylaxis from Wernicke-      Korsakoff syndrome.  3. Chronic pain.  The patient is being maintained on Vicodin while      hospitalized so that we may evaluate for fever.  The patient will      be sent home on Tylenol #3 at the time of discharge.  4. Generalized weakness.  The patient has generalized weakness, which      appears to be as a result of her long-standing alcohol abuse and      failure to thrive.  The patient has had 2 episodes of fall and      states that prior to coming to the hospital she has had several      episodes of falling.  There are no focal neurological deficits with      this patient.  I have asked physical therapy to evaluate the      patient and the patient may benefit from home care physical      therapy.   Addendum to be dictated at time of discharge.      Altha Harm, MD  Electronically Signed     MAM/MEDQ  D:  07/03/2006  T:  07/03/2006  Job:  [id number]

## 2010-08-05 ENCOUNTER — Other Ambulatory Visit: Payer: Self-pay | Admitting: Obstetrics and Gynecology

## 2010-08-05 DIAGNOSIS — Z1231 Encounter for screening mammogram for malignant neoplasm of breast: Secondary | ICD-10-CM

## 2010-08-20 ENCOUNTER — Ambulatory Visit: Payer: Medicaid Other

## 2010-09-21 ENCOUNTER — Ambulatory Visit: Payer: Medicaid Other

## 2010-09-27 ENCOUNTER — Ambulatory Visit: Payer: Medicaid Other

## 2010-10-25 IMAGING — CR DG CHEST 2V
2 series · 2 of 2 positions shown · non-contrast
Comparison: 12/31/2008

CLINICAL DATA: Cough and chills

CHEST - 2 VIEW

[w chest pa]
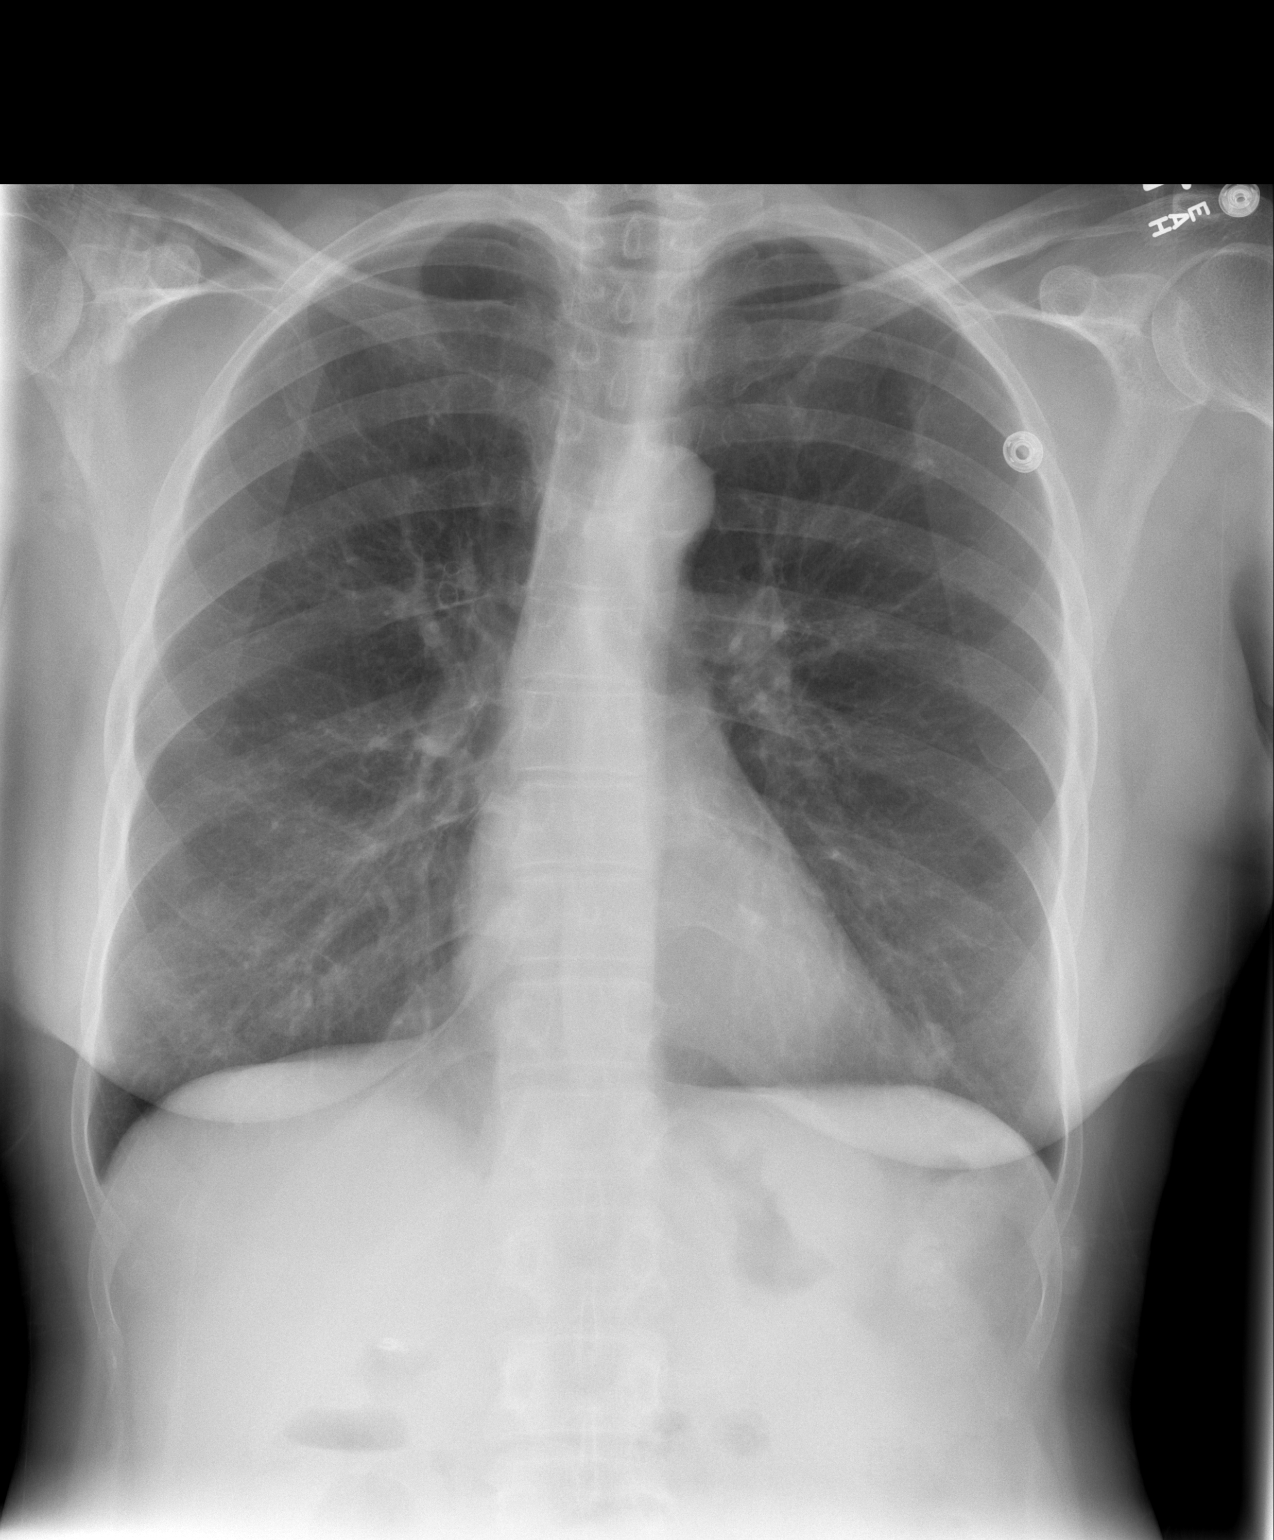

[w chest lat]
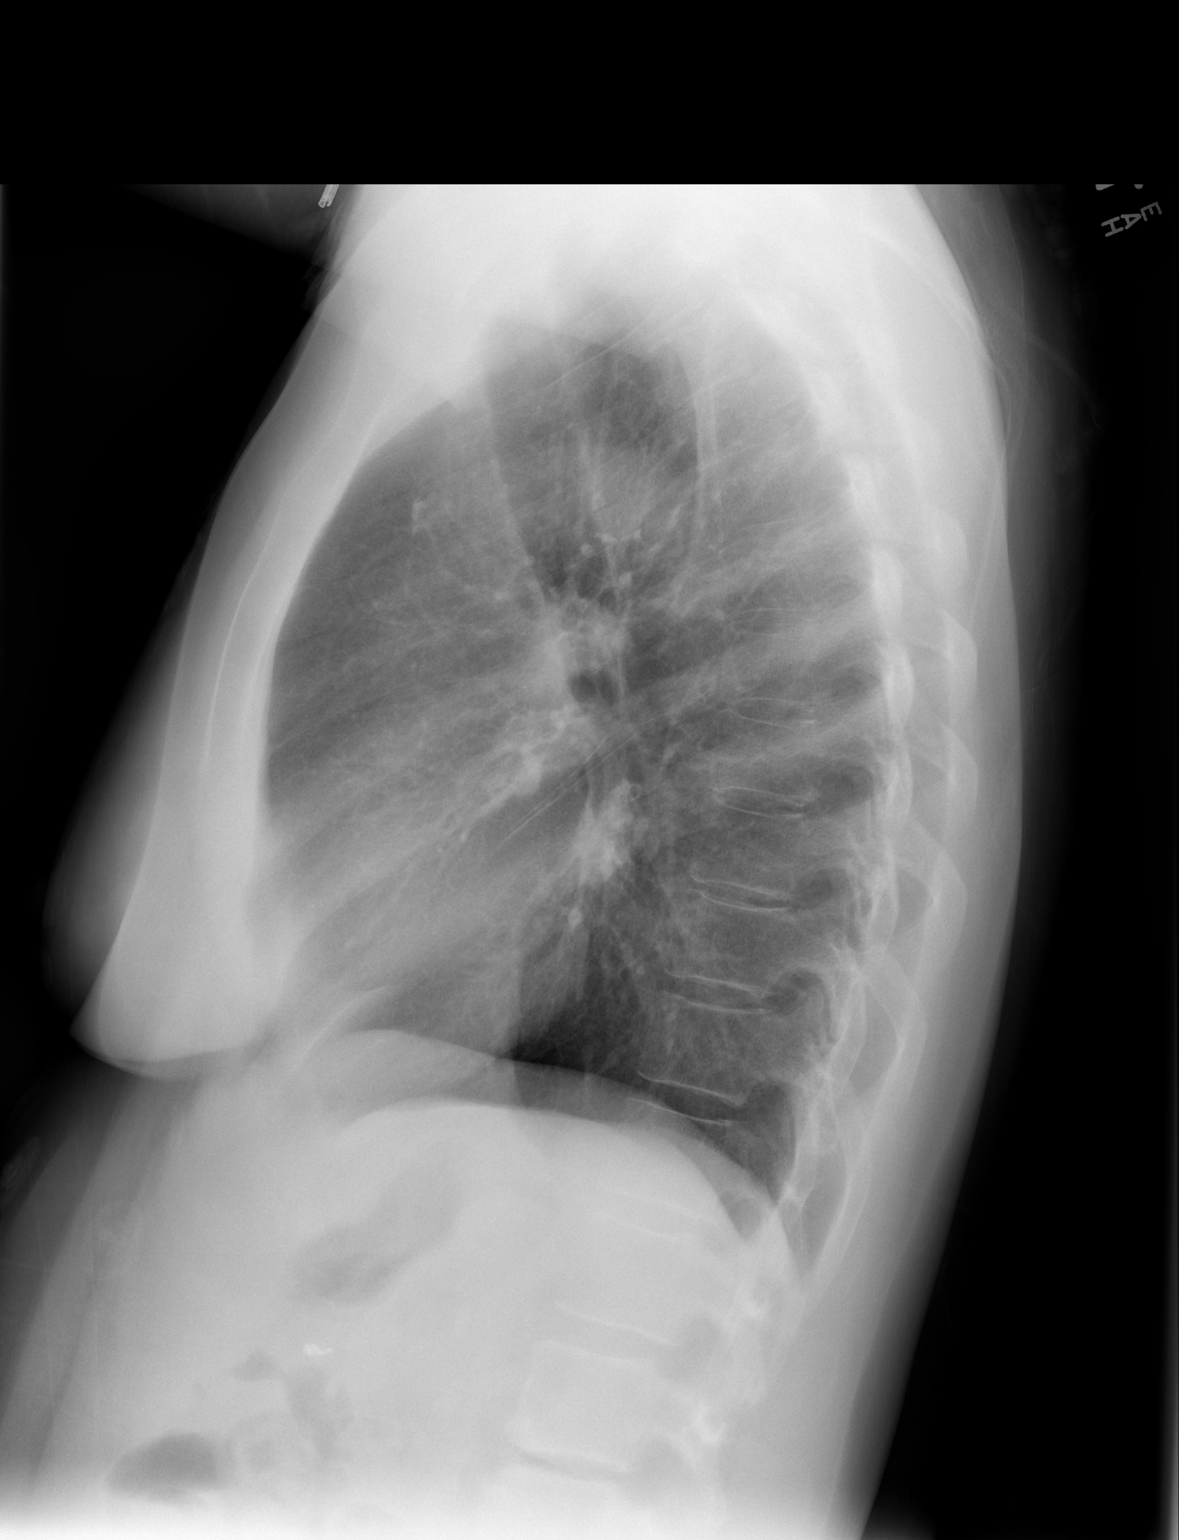

[2 of 2 positions shown; findings below may reference images not displayed]

FINDINGS: The heart is normal in size.  There is no heart failure.
The lungs are clear and there is no infiltrate or effusion.
Bilateral nipple shadows are noted.
IMPRESSION: No active cardiopulmonary disease.

## 2010-12-09 LAB — CBC
MCHC: 34.6
Platelets: 143 — ABNORMAL LOW
RBC: 4.54
WBC: 6

## 2010-12-09 LAB — COMPREHENSIVE METABOLIC PANEL
ALT: 80 — ABNORMAL HIGH
AST: 129 — ABNORMAL HIGH
Albumin: 4.7
Alkaline Phosphatase: 95
CO2: 23
Calcium: 9.6
Chloride: 109
Creatinine, Ser: 0.91
GFR calc Af Amer: >60
Sodium: 144
Total Bilirubin: 2.8 — ABNORMAL HIGH

## 2010-12-09 LAB — URINALYSIS, ROUTINE W REFLEX MICROSCOPIC
Ketones, ur: NEGATIVE
Nitrite: NEGATIVE
Protein, ur: NEGATIVE

## 2010-12-09 LAB — B-NATRIURETIC PEPTIDE (CONVERTED LAB): Pro B Natriuretic peptide (BNP): <30

## 2010-12-09 LAB — LIPASE, BLOOD: Lipase: 26

## 2010-12-09 LAB — DIFFERENTIAL
Basophils Relative: 1
Eosinophils Absolute: 0
Neutrophils Relative %: 34 — ABNORMAL LOW

## 2010-12-13 LAB — POCT I-STAT, CHEM 8
Calcium, Ion: 1.1 — ABNORMAL LOW
Creatinine, Ser: 1
Glucose, Bld: 101 — ABNORMAL HIGH
Hemoglobin: 14.3
Sodium: 150 — ABNORMAL HIGH
TCO2: 24

## 2010-12-13 LAB — URINALYSIS, ROUTINE W REFLEX MICROSCOPIC
Bilirubin Urine: NEGATIVE
Ketones, ur: NEGATIVE
Nitrite: NEGATIVE
Urobilinogen, UA: 0.2
pH: 6

## 2010-12-13 LAB — RAPID URINE DRUG SCREEN, HOSP PERFORMED
Amphetamines: NOT DETECTED
Cocaine: NOT DETECTED
Tetrahydrocannabinol: NOT DETECTED

## 2010-12-13 LAB — ACETAMINOPHEN LEVEL: Acetaminophen (Tylenol), Serum: <10 — ABNORMAL LOW

## 2010-12-17 LAB — COMPREHENSIVE METABOLIC PANEL
AST: 117 U/L — ABNORMAL HIGH (ref 0–37)
Albumin: 4.4 g/dL (ref 3.5–5.2)
BUN: 9 mg/dL (ref 6–23)
Calcium: 9.3 mg/dL (ref 8.4–10.5)
Creatinine, Ser: 0.79 mg/dL (ref 0.4–1.2)
GFR calc Af Amer: >60 mL/min (ref >60)
Total Protein: 7.8 g/dL (ref 6.0–8.3)

## 2010-12-17 LAB — CBC
MCHC: 33.6 g/dL (ref 30.0–36.0)
MCV: 95.5 fL (ref 78.0–100.0)
Platelets: 78 10*3/uL — ABNORMAL LOW (ref 150–400)
RDW: 13.2 % (ref 11.5–15.5)
WBC: 6.2 10*3/uL (ref 4.0–10.5)

## 2010-12-17 LAB — DIFFERENTIAL
Eosinophils Relative: 0 % (ref 0–5)
Lymphocytes Relative: 50 % — ABNORMAL HIGH (ref 12–46)
Lymphs Abs: 3.1 10*3/uL (ref 0.7–4.0)
Monocytes Absolute: 0.5 10*3/uL (ref 0.1–1.0)
Neutro Abs: 2.6 10*3/uL (ref 1.7–7.7)

## 2010-12-17 LAB — URINALYSIS, ROUTINE W REFLEX MICROSCOPIC
Bilirubin Urine: NEGATIVE
Hgb urine dipstick: NEGATIVE
Nitrite: NEGATIVE
Specific Gravity, Urine: 1.01 (ref 1.005–1.030)
Urobilinogen, UA: 0.2 mg/dL (ref 0.0–1.0)
pH: 6 (ref 5.0–8.0)

## 2010-12-17 LAB — ETHANOL: Alcohol, Ethyl (B): 216 mg/dL — ABNORMAL HIGH (ref 0–10)

## 2010-12-21 LAB — D-DIMER, QUANTITATIVE: D-Dimer, Quant: 0.32

## 2010-12-21 LAB — POCT CARDIAC MARKERS
Myoglobin, poc: 65.3
Operator id: 272551
Troponin i, poc: <0.05

## 2010-12-21 LAB — BASIC METABOLIC PANEL
BUN: 7
CO2: 26
GFR calc non Af Amer: >60
Glucose, Bld: 82
Potassium: 3.3 — ABNORMAL LOW
Sodium: 140

## 2010-12-21 LAB — B-NATRIURETIC PEPTIDE (CONVERTED LAB): Pro B Natriuretic peptide (BNP): 44

## 2010-12-21 LAB — CBC
HCT: 40.9
Hemoglobin: 13.6
MCHC: 33.3
Platelets: 134 — ABNORMAL LOW
RDW: 13.4

## 2010-12-21 LAB — DIFFERENTIAL
Basophils Absolute: 0
Basophils Relative: 1
Eosinophils Absolute: 0 — ABNORMAL LOW
Eosinophils Relative: 0
Monocytes Absolute: 0.4

## 2010-12-24 LAB — PROTIME-INR
INR: 1.4
Prothrombin Time: 17.2 — ABNORMAL HIGH

## 2010-12-24 LAB — COMPREHENSIVE METABOLIC PANEL
ALT: 15
AST: 37
Alkaline Phosphatase: 49
CO2: 27
CO2: 29
Calcium: 9.6
Chloride: 109
Creatinine, Ser: 0.91
GFR calc Af Amer: >60
Glucose, Bld: 89
Potassium: 3.6
Sodium: 143
Total Bilirubin: 0.6
Total Bilirubin: 0.9
Total Protein: 6.7
Total Protein: 7.6

## 2010-12-24 LAB — BASIC METABOLIC PANEL
CO2: 27
Calcium: 8.8
Creatinine, Ser: 1.07
GFR calc Af Amer: >60
GFR calc non Af Amer: 54 — ABNORMAL LOW
Glucose, Bld: 104 — ABNORMAL HIGH
Sodium: 143

## 2010-12-24 LAB — I-STAT 8, (EC8 V) (CONVERTED LAB)
BUN: 12
Bicarbonate: 21.9
Glucose, Bld: 107 — ABNORMAL HIGH
Hemoglobin: 13.9
Sodium: 135
TCO2: 23
pH, Ven: 7.546 — ABNORMAL HIGH

## 2010-12-24 LAB — CULTURE, BLOOD (ROUTINE X 2): Culture: NO GROWTH

## 2010-12-24 LAB — URINE MICROSCOPIC-ADD ON

## 2010-12-24 LAB — DIFFERENTIAL
Basophils Absolute: 0
Basophils Relative: 0
Eosinophils Absolute: 0
Eosinophils Relative: 0
Monocytes Absolute: 1.3 — ABNORMAL HIGH
Monocytes Relative: 8

## 2010-12-24 LAB — URINALYSIS, ROUTINE W REFLEX MICROSCOPIC
Glucose, UA: NEGATIVE
Hgb urine dipstick: NEGATIVE
Protein, ur: 100 — AB
Specific Gravity, Urine: 1.016
pH: 6

## 2010-12-24 LAB — URINE CULTURE: Colony Count: >=100000

## 2010-12-24 LAB — CBC
HCT: 31.2 — ABNORMAL LOW
HCT: 37.8
Hemoglobin: 10.6 — ABNORMAL LOW
Hemoglobin: 13.1
MCHC: 34.2
MCHC: 34.5
MCV: 96.3
Platelets: 138 — ABNORMAL LOW
RBC: 3.14 — ABNORMAL LOW
RBC: 3.93
RDW: 15.2 — ABNORMAL HIGH
RDW: 15.7 — ABNORMAL HIGH
RDW: 15.8 — ABNORMAL HIGH
RDW: 15.8 — ABNORMAL HIGH
WBC: 11.4 — ABNORMAL HIGH
WBC: 7.9

## 2010-12-24 LAB — HEPATIC FUNCTION PANEL
Bilirubin, Direct: 0.4 — ABNORMAL HIGH
Indirect Bilirubin: 1.1 — ABNORMAL HIGH
Total Bilirubin: 1.5 — ABNORMAL HIGH

## 2010-12-24 LAB — HIV ANTIBODY (ROUTINE TESTING W REFLEX): HIV: NONREACTIVE

## 2010-12-24 LAB — RAPID URINE DRUG SCREEN, HOSP PERFORMED
Cocaine: POSITIVE — AB
Tetrahydrocannabinol: NOT DETECTED

## 2010-12-24 LAB — TSH: TSH: 1.114

## 2010-12-24 LAB — POCT I-STAT CREATININE: Creatinine, Ser: 1.3 — ABNORMAL HIGH

## 2010-12-27 LAB — COMPREHENSIVE METABOLIC PANEL
AST: 111 — ABNORMAL HIGH
Albumin: 4.9
BUN: 7
Calcium: 9.8
Chloride: 105
Creatinine, Ser: 0.88
GFR calc Af Amer: >60
Total Bilirubin: 1.1
Total Protein: 9.2 — ABNORMAL HIGH

## 2010-12-27 LAB — DIFFERENTIAL
Eosinophils Relative: 0
Lymphocytes Relative: 64 — ABNORMAL HIGH
Lymphs Abs: 4.7 — ABNORMAL HIGH
Monocytes Absolute: 0.4
Monocytes Relative: 6
Neutro Abs: 2.1

## 2010-12-27 LAB — POCT CARDIAC MARKERS
CKMB, poc: 1.4
Myoglobin, poc: 51.8
Operator id: 272551
Troponin i, poc: <0.05

## 2010-12-27 LAB — CBC
MCV: 94
Platelets: 194
RDW: 14.8 — ABNORMAL HIGH
WBC: 7.3

## 2010-12-30 LAB — COMPREHENSIVE METABOLIC PANEL
ALT: 46 — ABNORMAL HIGH
AST: 82 — ABNORMAL HIGH
Albumin: 4.2
CO2: 26
Calcium: 8.9
Chloride: 105
Creatinine, Ser: 0.79
GFR calc Af Amer: >60
GFR calc non Af Amer: >60
Sodium: 143
Total Bilirubin: 2 — ABNORMAL HIGH

## 2010-12-30 LAB — CBC
MCV: 93.2
Platelets: 147 — ABNORMAL LOW
RBC: 4.71
WBC: 6.9

## 2010-12-30 LAB — DIFFERENTIAL
Basophils Absolute: 0
Basophils Relative: 0
Eosinophils Relative: 0
Monocytes Absolute: 0.3
Neutro Abs: 2.3

## 2010-12-30 LAB — URINALYSIS, ROUTINE W REFLEX MICROSCOPIC
Bilirubin Urine: NEGATIVE
Hgb urine dipstick: NEGATIVE
Protein, ur: NEGATIVE
Urobilinogen, UA: 1

## 2010-12-30 LAB — LIPASE, BLOOD: Lipase: 28

## 2010-12-30 LAB — ETHANOL: Alcohol, Ethyl (B): 242 — ABNORMAL HIGH

## 2011-03-04 ENCOUNTER — Ambulatory Visit
Admission: RE | Admit: 2011-03-04 | Discharge: 2011-03-04 | Disposition: A | Discharge status: Home / Self Care | Payer: Medicaid Other | Source: Ambulatory Visit | Attending: Internal Medicine | Admitting: Internal Medicine

## 2011-03-04 ENCOUNTER — Other Ambulatory Visit: Payer: Self-pay | Admitting: Internal Medicine

## 2011-05-13 ENCOUNTER — Ambulatory Visit (HOSPITAL_COMMUNITY): Payer: Medicaid Other | Attending: Obstetrics and Gynecology

## 2011-06-29 ENCOUNTER — Ambulatory Visit
Admission: RE | Admit: 2011-06-29 | Discharge: 2011-06-29 | Disposition: A | Discharge status: Home / Self Care | Payer: Medicaid Other | Source: Ambulatory Visit | Attending: Obstetrics and Gynecology | Admitting: Obstetrics and Gynecology

## 2011-06-29 DIAGNOSIS — Z1231 Encounter for screening mammogram for malignant neoplasm of breast: Secondary | ICD-10-CM

## 2012-03-10 IMAGING — CR DG CHEST 2V
2 series · 2 of 2 positions shown · non-contrast
Comparison: Chest x-ray 10/18/2009.

CLINICAL DATA: Right-sided chest pain.

CHEST - 2 VIEW

[w chest pa]
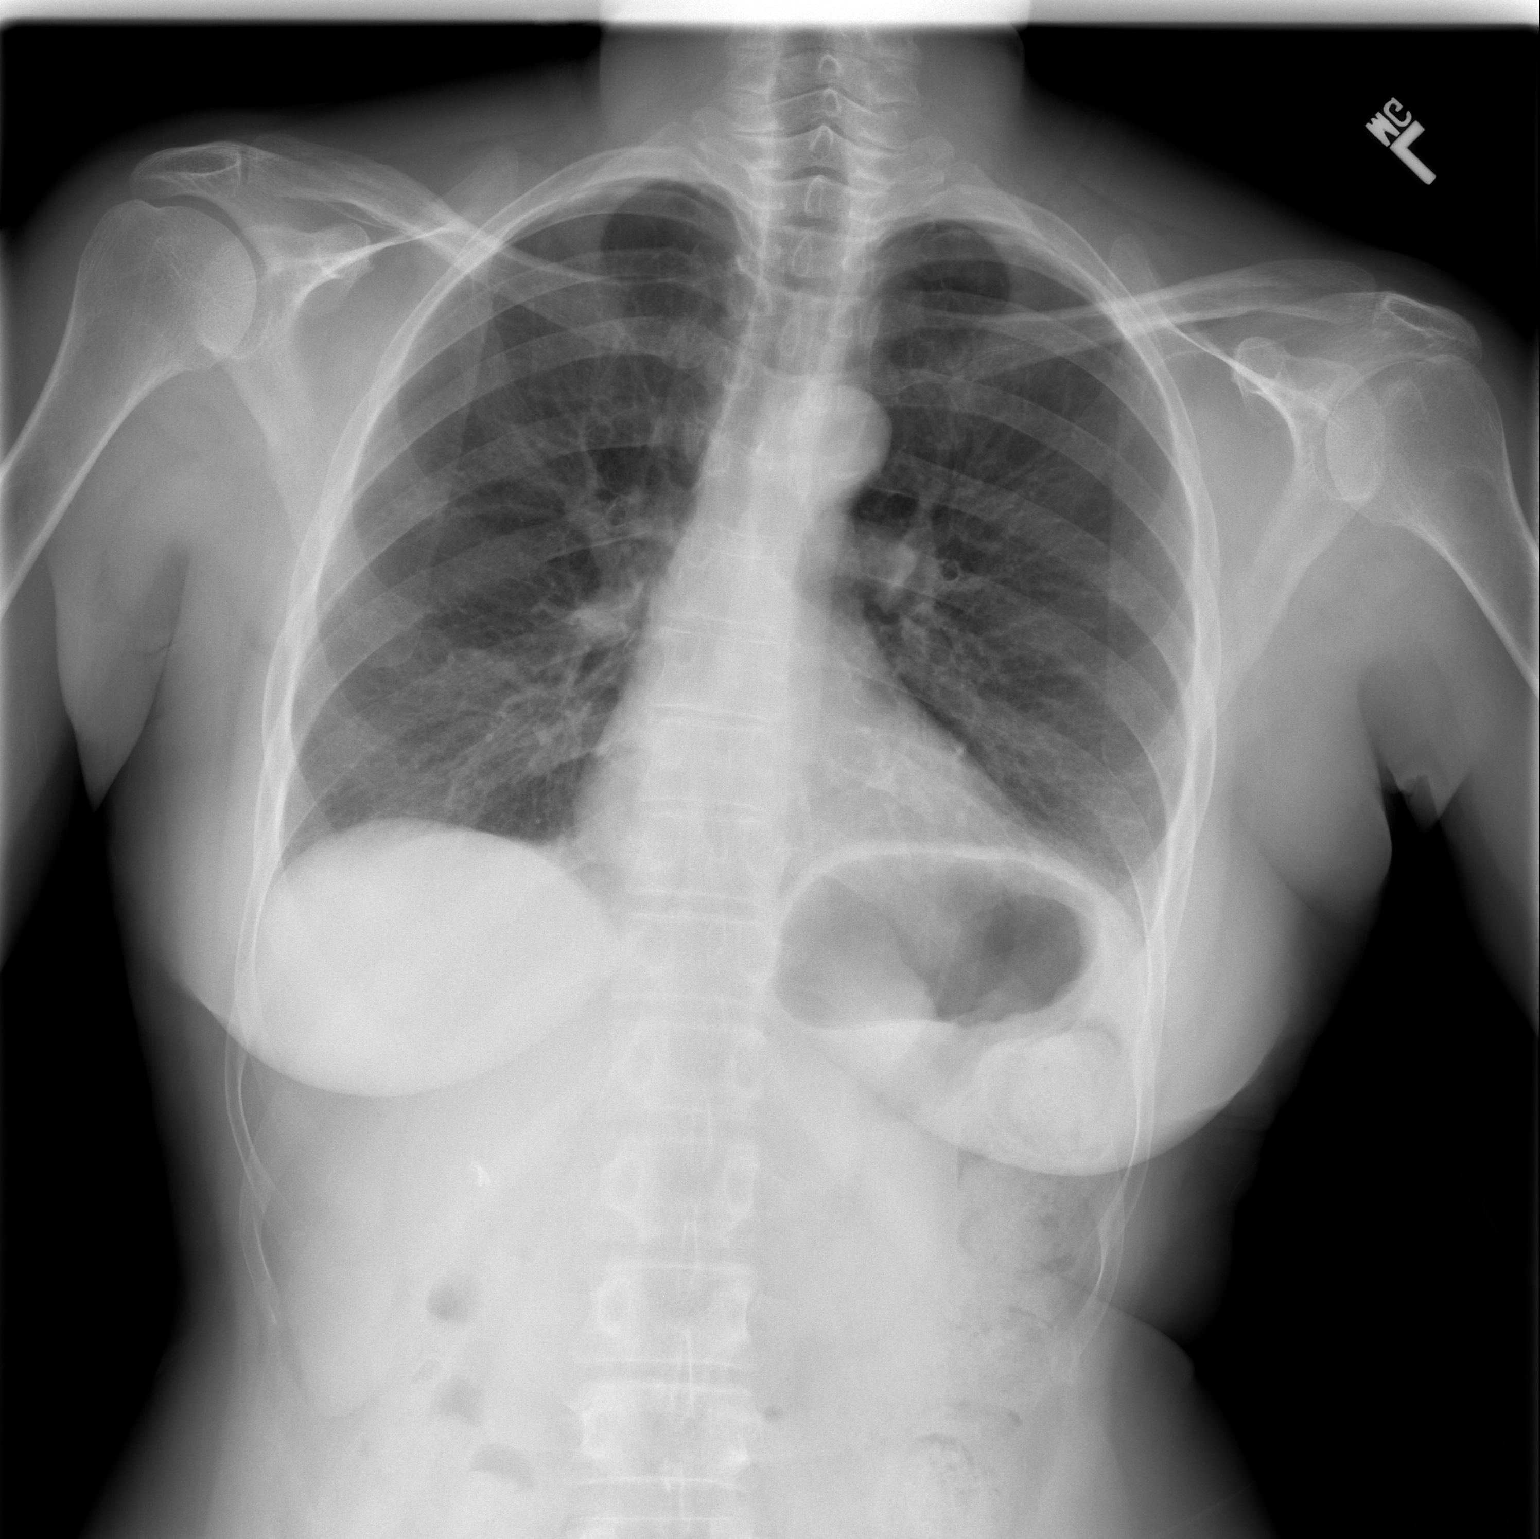

[w chest lat]
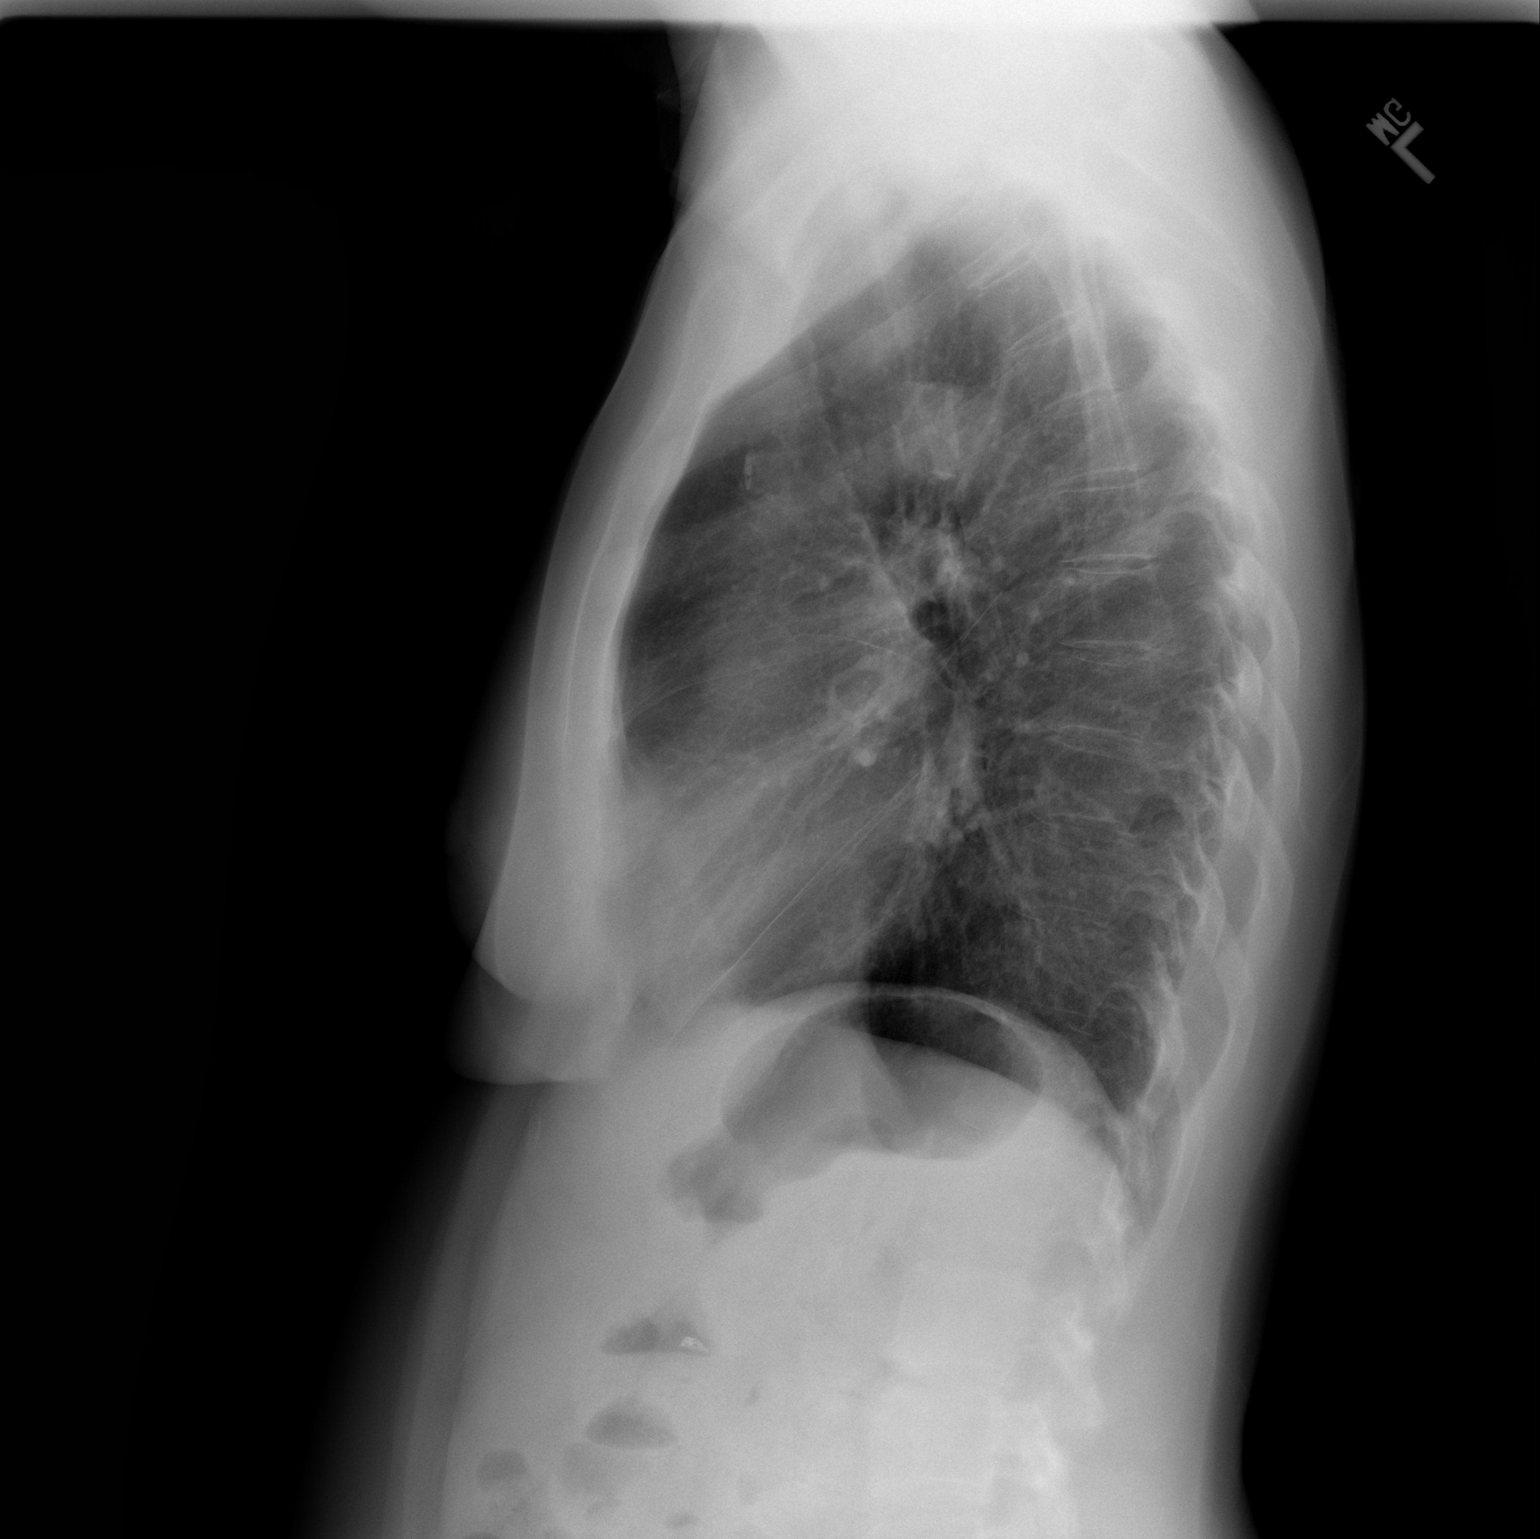

[2 of 2 positions shown; findings below may reference images not displayed]

FINDINGS: The cardiac silhouette, mediastinal and hilar contours
are within normal limits and stable.  There are bronchitic type
lung changes  suggesting bronchitis.  No infiltrates, edema or
effusions.  The bony thorax is intact.
IMPRESSION: Bronchitic type lung changes suggesting bronchitis.  No infiltrates
or effusions.

## 2012-03-26 ENCOUNTER — Emergency Department (HOSPITAL_COMMUNITY): Payer: Medicaid Other

## 2012-03-26 ENCOUNTER — Encounter (HOSPITAL_COMMUNITY): Payer: Self-pay

## 2012-03-26 ENCOUNTER — Emergency Department (HOSPITAL_COMMUNITY)
Admission: EM | Admit: 2012-03-26 | Discharge: 2012-03-26 | Disposition: A | Discharge status: Home / Self Care | Payer: Medicaid Other | Attending: Emergency Medicine | Admitting: Emergency Medicine

## 2012-03-26 DIAGNOSIS — Z79899 Other long term (current) drug therapy: Secondary | ICD-10-CM | POA: Insufficient documentation

## 2012-03-26 DIAGNOSIS — R5381 Other malaise: Secondary | ICD-10-CM | POA: Insufficient documentation

## 2012-03-26 DIAGNOSIS — R300 Dysuria: Secondary | ICD-10-CM | POA: Insufficient documentation

## 2012-03-26 DIAGNOSIS — K5900 Constipation, unspecified: Secondary | ICD-10-CM | POA: Insufficient documentation

## 2012-03-26 DIAGNOSIS — R112 Nausea with vomiting, unspecified: Secondary | ICD-10-CM | POA: Insufficient documentation

## 2012-03-26 DIAGNOSIS — F17200 Nicotine dependence, unspecified, uncomplicated: Secondary | ICD-10-CM | POA: Insufficient documentation

## 2012-03-26 DIAGNOSIS — R509 Fever, unspecified: Secondary | ICD-10-CM | POA: Insufficient documentation

## 2012-03-26 DIAGNOSIS — R51 Headache: Secondary | ICD-10-CM | POA: Insufficient documentation

## 2012-03-26 DIAGNOSIS — G8929 Other chronic pain: Secondary | ICD-10-CM | POA: Insufficient documentation

## 2012-03-26 DIAGNOSIS — M549 Dorsalgia, unspecified: Secondary | ICD-10-CM | POA: Insufficient documentation

## 2012-03-26 DIAGNOSIS — R350 Frequency of micturition: Secondary | ICD-10-CM | POA: Insufficient documentation

## 2012-03-26 HISTORY — DX: Dorsalgia, unspecified: M54.9

## 2012-03-26 HISTORY — DX: Other chronic pain: G89.29

## 2012-03-26 LAB — URINALYSIS, ROUTINE W REFLEX MICROSCOPIC
Bilirubin Urine: NEGATIVE
Glucose, UA: NEGATIVE mg/dL
Hgb urine dipstick: NEGATIVE
Specific Gravity, Urine: 1.015 (ref 1.005–1.030)
pH: 5 (ref 5.0–8.0)

## 2012-03-26 LAB — COMPREHENSIVE METABOLIC PANEL
Albumin: 4 g/dL (ref 3.5–5.2)
BUN: 7 mg/dL (ref 6–23)
CO2: 18 mEq/L — ABNORMAL LOW (ref 19–32)
Chloride: 103 mEq/L (ref 96–112)
Creatinine, Ser: 0.76 mg/dL (ref 0.50–1.10)
GFR calc non Af Amer: >90 mL/min (ref >90)
Total Bilirubin: 0.3 mg/dL (ref 0.3–1.2)

## 2012-03-26 LAB — LIPASE, BLOOD: Lipase: 23 U/L (ref 11–59)

## 2012-03-26 LAB — URINE MICROSCOPIC-ADD ON

## 2012-03-26 LAB — CBC WITH DIFFERENTIAL/PLATELET
Basophils Relative: 0 % (ref 0–1)
HCT: 37.7 % (ref 36.0–46.0)
Hemoglobin: 12.6 g/dL (ref 12.0–15.0)
MCH: 31.7 pg (ref 26.0–34.0)
MCHC: 33.4 g/dL (ref 30.0–36.0)
MCV: 95 fL (ref 78.0–100.0)
Monocytes Absolute: 0.7 10*3/uL (ref 0.1–1.0)
Monocytes Relative: 16 % — ABNORMAL HIGH (ref 3–12)
Neutro Abs: 1.2 10*3/uL — ABNORMAL LOW (ref 1.7–7.7)

## 2012-03-26 MED ORDER — TRAMADOL HCL 50 MG PO TABS
50.0000 mg | ORAL_TABLET | Freq: Once | ORAL | Status: AC
  Administered 2012-03-26: 50 mg via ORAL
  Filled 2012-03-26: qty 1

## 2012-03-26 MED ORDER — SODIUM CHLORIDE 0.9 % IV BOLUS (SEPSIS)
500.0000 mL | Freq: Once | INTRAVENOUS | Status: AC
  Administered 2012-03-26: 500 mL via INTRAVENOUS

## 2012-03-26 MED ORDER — CYCLOBENZAPRINE HCL 5 MG PO TABS: 5.0000 mg | ORAL_TABLET | Freq: Three times a day (TID) | ORAL | Status: DC | PRN

## 2012-03-26 NOTE — ED Notes (Signed)
Pt c/o left flank pain since yesterday. States it hurts when she stands for long periods of time and a little when she urinates. C/o N/V and vomited last prior to arrival to ED.

## 2012-03-26 NOTE — ED Provider Notes (Signed)
History     CSN: [id number]  Arrival date & time 03/26/12  1501   First MD Initiated Contact with Patient 03/26/12 2004      Chief Complaint  Patient presents with  . Flank Pain    (Consider location/radiation/quality/duration/timing/severity/associated sxs/prior treatment) Patient is a 57 y.o. female presenting with flank pain. The history is provided by the patient.  Flank Pain This is a new problem. The current episode started yesterday. The problem occurs constantly. The problem has been gradually worsening. Associated symptoms include a fever, headaches, nausea, urinary symptoms, vomiting and weakness. Pertinent negatives include no abdominal pain, anorexia, change in bowel habit, chest pain, chills, congestion, coughing, diaphoresis, myalgias, neck pain, rash, sore throat or vertigo. Associated symptoms comments: Pt states that she woke up yesterday morning with pain in her left flank that moved to her right flank.  She says that when she bends over to pick something up it hurts worse.  She also complains of nausea and of three vomiting episodes, two of which were today. She also states that she has had an increase in urinary frequency and urgency, as well as a burning sensation on urination. She states that she has had no change to her bowel habits, but that she is regularly constipated, most likely due to her chronic use of percocet.. The symptoms are aggravated by bending. She has tried oral narcotics (Pt states that she has tried taking the percocet that she usually takes for her chronic back pain for her flank pain, but that it has not helped.) for the symptoms. The treatment provided no relief.    Past Medical History  Diagnosis Date  . Chronic back pain     History reviewed. No pertinent past surgical history.  No family history on file.  History  Substance Use Topics  . Smoking status: Current Every Day Smoker -- 0.5 packs/day    Types: Cigarettes  . Smokeless tobacco:  Not on file  . Alcohol Use: No    OB History    Grav Para Term Preterm Abortions TAB SAB Ect Mult Living                  Review of Systems  Constitutional: Positive for fever. Negative for chills and diaphoresis.  HENT: Negative for congestion, sore throat, facial swelling, rhinorrhea, sneezing and neck pain.   Eyes: Negative.   Respiratory: Negative for apnea, cough, chest tightness, shortness of breath, wheezing and stridor.   Cardiovascular: Negative for chest pain.  Gastrointestinal: Positive for nausea, vomiting and constipation. Negative for abdominal pain, diarrhea, abdominal distention, anorexia and change in bowel habit.       Pt has experienced some nausea and vomiting over the last two days since the onset of the pain  Genitourinary: Positive for dysuria, urgency, frequency and flank pain. Negative for hematuria, decreased urine volume, vaginal discharge, difficulty urinating and pelvic pain.  Musculoskeletal: Positive for back pain. Negative for myalgias.  Skin: Negative for rash.  Neurological: Positive for weakness and headaches. Negative for dizziness, vertigo, syncope and light-headedness.  Psychiatric/Behavioral: Negative.     Allergies  Ibuprofen and Penicillins  Home Medications   Current Outpatient Rx  Name  Route  Sig  Dispense  Refill  . OXYCODONE-ACETAMINOPHEN 5-325 MG PO TABS   Oral   Take 1 tablet by mouth every 4 (four) hours as needed. For pain         . TRAZODONE HCL 50 MG PO TABS   Oral   Take  50 mg by mouth at bedtime.         . CYCLOBENZAPRINE HCL 5 MG PO TABS   Oral   Take 1 tablet (5 mg total) by mouth 3 (three) times daily as needed for muscle spasms.   30 tablet   0     BP 116/71  Pulse 98  Temp 98.6 F (37 C) (Oral)  Resp 20  SpO2 94%  Physical Exam  Constitutional: She appears well-developed and well-nourished. No distress.  HENT:  Head: Normocephalic and atraumatic.  Right Ear: External ear normal.  Left Ear:  External ear normal.  Nose: Nose normal.  Eyes: Conjunctivae normal and EOM are normal. Pupils are equal, round, and reactive to light. Right eye exhibits no discharge. Left eye exhibits no discharge. No scleral icterus.  Neck: Normal range of motion.  Cardiovascular: Normal rate, regular rhythm and normal heart sounds.  Exam reveals no gallop and no friction rub.   No murmur heard. Pulmonary/Chest: Effort normal and breath sounds normal. No stridor. No respiratory distress. She has no wheezes. She has no rales.  Abdominal: Soft. Bowel sounds are normal.  Skin: She is not diaphoretic.    ED Course  Procedures (including critical care time)  Labs Reviewed  URINALYSIS, ROUTINE W REFLEX MICROSCOPIC - Abnormal; Notable for the following:    Leukocytes, UA SMALL (*)     All other components within normal limits  URINE MICROSCOPIC-ADD ON - Abnormal; Notable for the following:    Squamous Epithelial / LPF FEW (*)     Bacteria, UA MANY (*)     All other components within normal limits  CBC WITH DIFFERENTIAL - Abnormal; Notable for the following:    Platelets 123 (*)     Neutrophils Relative 26 (*)     Neutro Abs 1.2 (*)     Lymphocytes Relative 58 (*)     Monocytes Relative 16 (*)     All other components within normal limits  COMPREHENSIVE METABOLIC PANEL - Abnormal; Notable for the following:    CO2 18 (*)     AST 45 (*)     All other components within normal limits  LIPASE, BLOOD  URINE CULTURE   Dg Abd Acute W/chest  03/26/2012  *RADIOLOGY REPORT*  Clinical Data: Left lower abdominal pain  ACUTE ABDOMEN SERIES (ABDOMEN 2 VIEW & CHEST 1 VIEW)  Comparison: 03/04/2011 and earlier studies  Findings: Lungs are clear.  No effusion.  Heart size normal. Vascular clips in the right upper abdomen.  No free air.  Small bowel decompressed.  Normal distribution of gas and stool throughout the colon.  Regional bones unremarkable.  No abnormal abdominal calcifications.  IMPRESSION:  1.  Normal  bowel gas pattern. 2.  No free air. 3.  No acute cardiopulmonary disease.   Original Report Authenticated By: D. Andria Rhein, MD      1. Back pain       MDM  Will culture although doubtful this is the source of her discomfort         Arman Filter, NP 03/26/12 2236

## 2012-03-26 NOTE — ED Notes (Signed)
NP at bedside.

## 2012-03-26 NOTE — ED Notes (Signed)
Pt changed into gown and ready to be seen by provider

## 2012-03-27 LAB — URINE CULTURE

## 2012-03-27 NOTE — ED Provider Notes (Signed)
Medical screening examination/treatment/procedure(s) were performed by non-physician practitioner and as supervising physician I was immediately available for consultation/collaboration.  Cheron Coryell J. Chella Chapdelaine, MD 03/27/12 2359 

## 2012-08-07 ENCOUNTER — Other Ambulatory Visit: Payer: Self-pay

## 2012-08-07 DIAGNOSIS — Z1231 Encounter for screening mammogram for malignant neoplasm of breast: Secondary | ICD-10-CM

## 2012-09-04 ENCOUNTER — Ambulatory Visit
Admission: RE | Admit: 2012-09-04 | Discharge: 2012-09-04 | Disposition: A | Discharge status: Home / Self Care | Payer: Medicaid Other | Source: Ambulatory Visit

## 2012-09-04 DIAGNOSIS — Z1231 Encounter for screening mammogram for malignant neoplasm of breast: Secondary | ICD-10-CM

## 2012-10-08 ENCOUNTER — Inpatient Hospital Stay (HOSPITAL_COMMUNITY)
Admission: EM | Admit: 2012-10-08 | Discharge: 2012-10-10 | DRG: 065 | Disposition: A | Discharge status: Home / Self Care | Payer: Medicaid Other | Attending: Internal Medicine | Admitting: Internal Medicine

## 2012-10-08 ENCOUNTER — Emergency Department (INDEPENDENT_AMBULATORY_CARE_PROVIDER_SITE_OTHER)
Admission: EM | Admit: 2012-10-08 | Discharge: 2012-10-08 | Disposition: A | Discharge status: Home / Self Care | Payer: Medicaid Other | Source: Home / Self Care | Attending: Family Medicine | Admitting: Family Medicine

## 2012-10-08 ENCOUNTER — Encounter (HOSPITAL_COMMUNITY): Payer: Self-pay | Admitting: Emergency Medicine

## 2012-10-08 DIAGNOSIS — K219 Gastro-esophageal reflux disease without esophagitis: Secondary | ICD-10-CM

## 2012-10-08 DIAGNOSIS — I63311 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 cerebral artery: Secondary | ICD-10-CM | POA: Diagnosis present

## 2012-10-08 DIAGNOSIS — Z8673 Personal history of transient ischemic attack (TIA), and cerebral infarction without residual deficits: Secondary | ICD-10-CM

## 2012-10-08 DIAGNOSIS — I10 Essential (primary) hypertension: Secondary | ICD-10-CM | POA: Diagnosis present

## 2012-10-08 DIAGNOSIS — R29810 Facial weakness: Secondary | ICD-10-CM | POA: Diagnosis present

## 2012-10-08 DIAGNOSIS — D696 Thrombocytopenia, unspecified: Secondary | ICD-10-CM

## 2012-10-08 DIAGNOSIS — I69359 Hemiplegia and hemiparesis following cerebral infarction affecting unspecified side: Secondary | ICD-10-CM

## 2012-10-08 DIAGNOSIS — Z23 Encounter for immunization: Secondary | ICD-10-CM

## 2012-10-08 DIAGNOSIS — I6350 Cerebral infarction due to unspecified occlusion or stenosis of unspecified cerebral artery: Principal | ICD-10-CM | POA: Diagnosis present

## 2012-10-08 DIAGNOSIS — I639 Cerebral infarction, unspecified: Secondary | ICD-10-CM

## 2012-10-08 DIAGNOSIS — N12 Tubulo-interstitial nephritis, not specified as acute or chronic: Secondary | ICD-10-CM | POA: Diagnosis present

## 2012-10-08 DIAGNOSIS — K2100 Gastro-esophageal reflux disease with esophagitis, without bleeding: Secondary | ICD-10-CM

## 2012-10-08 DIAGNOSIS — K7460 Unspecified cirrhosis of liver: Secondary | ICD-10-CM | POA: Diagnosis present

## 2012-10-08 DIAGNOSIS — K859 Acute pancreatitis without necrosis or infection, unspecified: Secondary | ICD-10-CM

## 2012-10-08 DIAGNOSIS — I69959 Hemiplegia and hemiparesis following unspecified cerebrovascular disease affecting unspecified side: Secondary | ICD-10-CM

## 2012-10-08 DIAGNOSIS — R109 Unspecified abdominal pain: Secondary | ICD-10-CM

## 2012-10-08 DIAGNOSIS — G8190 Hemiplegia, unspecified affecting unspecified side: Secondary | ICD-10-CM | POA: Diagnosis present

## 2012-10-08 DIAGNOSIS — F17200 Nicotine dependence, unspecified, uncomplicated: Secondary | ICD-10-CM | POA: Diagnosis present

## 2012-10-08 DIAGNOSIS — F1010 Alcohol abuse, uncomplicated: Secondary | ICD-10-CM

## 2012-10-08 DIAGNOSIS — M549 Dorsalgia, unspecified: Secondary | ICD-10-CM | POA: Diagnosis present

## 2012-10-08 DIAGNOSIS — M129 Arthropathy, unspecified: Secondary | ICD-10-CM | POA: Diagnosis present

## 2012-10-08 DIAGNOSIS — F1410 Cocaine abuse, uncomplicated: Secondary | ICD-10-CM

## 2012-10-08 DIAGNOSIS — R10A0 Flank pain, unspecified side: Secondary | ICD-10-CM | POA: Diagnosis present

## 2012-10-08 DIAGNOSIS — I6529 Occlusion and stenosis of unspecified carotid artery: Secondary | ICD-10-CM | POA: Diagnosis present

## 2012-10-08 DIAGNOSIS — G8929 Other chronic pain: Secondary | ICD-10-CM | POA: Diagnosis present

## 2012-10-08 DIAGNOSIS — F329 Major depressive disorder, single episode, unspecified: Secondary | ICD-10-CM | POA: Diagnosis present

## 2012-10-08 DIAGNOSIS — F3289 Other specified depressive episodes: Secondary | ICD-10-CM

## 2012-10-08 DIAGNOSIS — F4310 Post-traumatic stress disorder, unspecified: Secondary | ICD-10-CM | POA: Diagnosis present

## 2012-10-08 DIAGNOSIS — J45909 Unspecified asthma, uncomplicated: Secondary | ICD-10-CM

## 2012-10-08 DIAGNOSIS — E785 Hyperlipidemia, unspecified: Secondary | ICD-10-CM | POA: Diagnosis present

## 2012-10-08 HISTORY — DX: Urinary tract infection, site not specified: N39.0

## 2012-10-08 HISTORY — DX: Alcohol abuse, uncomplicated: F10.10

## 2012-10-08 HISTORY — DX: Post-traumatic stress disorder, unspecified: F43.10

## 2012-10-08 HISTORY — DX: Major depressive disorder, single episode, unspecified: F32.9

## 2012-10-08 HISTORY — DX: Noninfective gastroenteritis and colitis, unspecified: K52.9

## 2012-10-08 LAB — POCT URINALYSIS DIP (DEVICE)
Glucose, UA: NEGATIVE mg/dL
Nitrite: NEGATIVE
Urobilinogen, UA: 1 mg/dL (ref 0.0–1.0)

## 2012-10-08 LAB — COMPREHENSIVE METABOLIC PANEL
ALT: 20 U/L (ref 0–35)
AST: 27 U/L (ref 0–37)
Alkaline Phosphatase: 69 U/L (ref 39–117)
CO2: 27 mEq/L (ref 19–32)
GFR calc Af Amer: >90 mL/min (ref >90)
GFR calc non Af Amer: >90 mL/min (ref >90)
Glucose, Bld: 87 mg/dL (ref 70–99)
Potassium: 3.5 mEq/L (ref 3.5–5.1)
Sodium: 141 mEq/L (ref 135–145)
Total Protein: 8.4 g/dL — ABNORMAL HIGH (ref 6.0–8.3)

## 2012-10-08 LAB — CBC WITH DIFFERENTIAL/PLATELET
Lymphocytes Relative: 50 % — ABNORMAL HIGH (ref 12–46)
Lymphs Abs: 3.4 10*3/uL (ref 0.7–4.0)
Neutrophils Relative %: 40 % — ABNORMAL LOW (ref 43–77)
Platelets: 148 10*3/uL — ABNORMAL LOW (ref 150–400)
RBC: 4.1 MIL/uL (ref 3.87–5.11)
WBC: 6.8 10*3/uL (ref 4.0–10.5)

## 2012-10-08 MED ORDER — ASPIRIN 81 MG PO CHEW
324.0000 mg | CHEWABLE_TABLET | Freq: Once | ORAL | Status: DC
  Filled 2012-10-08: qty 4

## 2012-10-08 NOTE — ED Notes (Signed)
PT. REPORTS LEFT FLANK PAIN ONSET Saturday , TRANSFERRED FROM [facility] URINALYSIS DONE , DENIES HEMATURIA OR DYSURIA , SPEECH CLEAR , NO FACIAL ASYMMETRY . NO ARM DRIFT , EQUAL STRONG GRIPS . AMBULATORY .

## 2012-10-08 NOTE — ED Notes (Signed)
Patient states her left side has been hurting for a week now.   Patient's urine does have odor and patient states she does have some discharge.  Patient states she has a cold sx.

## 2012-10-08 NOTE — ED Provider Notes (Signed)
CSN: [id number]     Arrival date & time 10/08/12  1953 History     First MD Initiated Contact with Patient 10/08/12 2337     Chief Complaint  Patient presents with  . Flank Pain   (Consider location/radiation/quality/duration/timing/severity/associated sxs/prior Treatment) Patient is a 57 y.o. female presenting with flank pain. The history is provided by the patient.  Flank Pain  She noticed onset 2 days ago of some aching in her left flank area. At about the same time, she noted weakness in her left arm and left leg and some difficulty speaking. She noted she was having difficulty holding things in her hand and difficulty walking and going up steps. She had a bifrontal headache which has since resolved. There is no associated chest pain, and dyspnea, nausea, vomiting. She's not had any fever or chills. Symptoms have been stable over the last 2 days. She went to urgent care Center today and was then transferred to the ED for evaluation of possible stroke. She is a cigarette smoker admitting to smoking about one quarter pack of cigarettes a day. She denies illicit drug use and denies ethanol use.  Past Medical History  Diagnosis Date  . Chronic back pain    History reviewed. No pertinent past surgical history. No family history on file. History  Substance Use Topics  . Smoking status: Current Every Day Smoker -- 0.50 packs/day    Types: Cigarettes  . Smokeless tobacco: Not on file  . Alcohol Use: No   OB History   Grav Para Term Preterm Abortions TAB SAB Ect Mult Living                 Review of Systems  Genitourinary: Positive for flank pain.  All other systems reviewed and are negative.    Allergies  Ibuprofen and Penicillins  Home Medications   Current Outpatient Rx  Name  Route  Sig  Dispense  Refill  . cyclobenzaprine (FLEXERIL) 5 MG tablet   Oral   Take 1 tablet (5 mg total) by mouth 3 (three) times daily as needed for muscle spasms.   30 tablet   0   .  oxyCODONE-acetaminophen (PERCOCET/ROXICET) 5-325 MG per tablet   Oral   Take 1 tablet by mouth every 4 (four) hours as needed. For pain         . traZODone (DESYREL) 50 MG tablet   Oral   Take 50 mg by mouth at bedtime.          BP 135/98  Pulse 69  Temp(Src) 97.8 F (36.6 C) (Oral)  Resp 20  Ht 5\' 3"  (1.6 m)  Wt 131 lb (59.421 kg)  BMI 23.21 kg/m2  SpO2 97% Physical Exam  Nursing note and vitals reviewed.  57 year old female, resting comfortably and in no acute distress. Vital signs are significant for diastolic hypertension with blood pressure 135/98. Oxygen saturation is 97%, which is normal. Head is normocephalic and atraumatic. Pupils are 5 mm and minimally reactive. EOMI. Fundi show no hemorrhage, exudate, or papilledema. Oropharynx is clear. Left central facial droop is present. Neck is nontender and supple without adenopathy or JVD. There no carotid bruits. Back is nontender in the midline. There is mild left CVA tenderness. Lungs are clear without rales, wheezes, or rhonchi. Chest is nontender. Heart has regular rate and rhythm without murmur. Abdomen is soft, flat, nontender without masses or hepatosplenomegaly and peristalsis is normoactive. Extremities have no cyanosis or edema, full range of motion is  present. Skin is warm and dry without rash. Neurologic: She is awake, alert, oriented. Speech is dysarthric but spontaneous and fluent and appropriate, cranial nerves are significant for left central facial droop, there is a moderate left hemiparesthesias with strength 4/5. Left pronator drift is present. There is no Babinski reflexes present.  ED Course   Procedures (including critical care time)  Results for orders placed during the hospital encounter of 10/08/12  CBC WITH DIFFERENTIAL      Result Value Range   WBC 6.8  4.0 - 10.5 K/uL   RBC 4.10  3.87 - 5.11 MIL/uL   Hemoglobin 13.3  12.0 - 15.0 g/dL   HCT 45.4  09.8 - 11.9 %   MCV 92.9  78.0 - 100.0 fL    MCH 32.4  26.0 - 34.0 pg   MCHC 34.9  30.0 - 36.0 g/dL   RDW 14.7  82.9 - 56.2 %   Platelets 148 (*) 150 - 400 K/uL   Neutrophils Relative % 40 (*) 43 - 77 %   Neutro Abs 2.8  1.7 - 7.7 K/uL   Lymphocytes Relative 50 (*) 12 - 46 %   Lymphs Abs 3.4  0.7 - 4.0 K/uL   Monocytes Relative 9  3 - 12 %   Monocytes Absolute 0.6  0.1 - 1.0 K/uL   Eosinophils Relative 0  0 - 5 %   Eosinophils Absolute 0.0  0.0 - 0.7 K/uL   Basophils Relative 0  0 - 1 %   Basophils Absolute 0.0  0.0 - 0.1 K/uL  COMPREHENSIVE METABOLIC PANEL      Result Value Range   Sodium 141  135 - 145 mEq/L   Potassium 3.5  3.5 - 5.1 mEq/L   Chloride 105  96 - 112 mEq/L   CO2 27  19 - 32 mEq/L   Glucose, Bld 87  70 - 99 mg/dL   BUN 10  6 - 23 mg/dL   Creatinine, Ser 1.30  0.50 - 1.10 mg/dL   Calcium 86.5  8.4 - 78.4 mg/dL   Total Protein 8.4 (*) 6.0 - 8.3 g/dL   Albumin 4.4  3.5 - 5.2 g/dL   AST 27  0 - 37 U/L   ALT 20  0 - 35 U/L   Alkaline Phosphatase 69  39 - 117 U/L   Total Bilirubin 0.4  0.3 - 1.2 mg/dL   GFR calc non Af Amer >90  >90 mL/min   GFR calc Af Amer >90  >90 mL/min  URINALYSIS, ROUTINE W REFLEX MICROSCOPIC      Result Value Range   Color, Urine YELLOW  YELLOW   APPearance CLOUDY (*) CLEAR   Specific Gravity, Urine 1.023  1.005 - 1.030   pH 6.0  5.0 - 8.0   Glucose, UA NEGATIVE  NEGATIVE mg/dL   Hgb urine dipstick NEGATIVE  NEGATIVE   Bilirubin Urine NEGATIVE  NEGATIVE   Ketones, ur NEGATIVE  NEGATIVE mg/dL   Protein, ur NEGATIVE  NEGATIVE mg/dL   Urobilinogen, UA 1.0  0.0 - 1.0 mg/dL   Nitrite NEGATIVE  NEGATIVE   Leukocytes, UA LARGE (*) NEGATIVE  PROTIME-INR      Result Value Range   Prothrombin Time 13.9  11.6 - 15.2 seconds   INR 1.09  0.00 - 1.49  APTT      Result Value Range   aPTT 31  24 - 37 seconds  URINE MICROSCOPIC-ADD ON      Result Value Range  Squamous Epithelial / LPF RARE  RARE   WBC, UA 7-10  <3 WBC/hpf   Bacteria, UA MANY (*) RARE   Urine-Other MUCOUS PRESENT      Dg Chest 2 View  10/09/2012   *RADIOLOGY REPORT*  Clinical Data: Cough, cold, flank pain.  CHEST - 2 VIEW  Comparison: 03/26/2012.  Findings: Abnormal aeration at the right base, with streaky opacities and elevation of the right diaphragm.  The lower lungs appear more normal in the lateral projection.  No cardiomegaly. Aortic arch atherosclerosis.  No acute osseous findings.  No effusion or pneumothorax.  IMPRESSION: Abnormality of the right lower lung which could represent atelectasis or infectious infiltrate.   Original Report Authenticated By: Tiburcio Pea   Ct Head Wo Contrast  10/09/2012   *RADIOLOGY REPORT*  Clinical Data: Left sided paresthesias and weakness.  Speech difficulty and left facial droop.  CT HEAD WITHOUT CONTRAST  Technique:  Contiguous axial images were obtained from the base of the skull through the vertex without contrast.  Comparison: 12/31/2008.  Findings:  Calvarium:No acute osseous abnormality. No lytic or blastic lesion.  Orbits: No acute abnormality.  Sinuses:  Effusion filling the majority of the imaged left maxillary sinus.  No infiltration of periantral fat.  Brain: No definite abnormality to explain all the reported deficits.  There is a new low attenuation area within the lower right pons, approximately 8 mm.  Unchanged linear low attenuation in the external capsules bilaterally, previous perforator infarctions or toxic/metabolic injury.  No evidence of acute hemorrhage, hydrocephalus, mass lesion/shift.  Intracranial calcific atherosclerosis.  Chronic, mild encephalomalacia in the superior left cerebellum.  These results were called by telephone on 10/09/2012 at 01:10 a.m. to Dr Blinda Leatherwood, who verbally acknowledged these results.  IMPRESSION: 1.  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  2.  Large left maxillary sinus effusion, mostly likely acute sinusitis.   Original Report Authenticated By: Tiburcio Pea    Images  viewed by me.  ECG shows normal sinus rhythm with a rate of 71, no ectopy. Normal axis. Normal P wave. Normal QRS. Normal intervals. Normal ST and T waves. Impression: normal ECG. Compared with ECG of 07/20/2010, no significant changes are seen.  1. Stroke   2. Thrombocytopenia      MDM  Stroke which appears to involve the right cerebral hemisphere. She is now within 2 days after last known normal so she is well outside the window of code stroke and not a candidate for any acute interventions. She will be sent for CT scan and admitted for further workup and initiation of physical therapy. Old records are reviewed and are no pertinent previous visits.  CT shows an age-indeterminate lucency in the right side of the pons which could explain her deficits. She will need MRI scan to clarify. CT suggests acute maxillary sinusitis but she does not clinically have sinusitis. Urinalysis suggest possible UTI but she has no urinary symptoms. Case is discussed with Dr. Jomarie Longs of [facility] who agrees to admit the patient. Dr. Jomarie Longs requests she be given a dose of ceftriaxone to treat possible urinary tract infection.  Dione Booze, MD 10/09/12 [phone]

## 2012-10-08 NOTE — ED Provider Notes (Signed)
  CSN: [id number]     Arrival date & time 10/08/12  1722 History     First MD Initiated Contact with Patient 10/08/12 1837     Chief Complaint  Patient presents with  . Flank Pain   (Consider location/radiation/quality/duration/timing/severity/associated sxs/prior Treatment) Patient is a 57 y.o. female presenting with flank pain. The history is provided by the patient.  Flank Pain This is a new problem. The current episode started 2 days ago. The problem has not changed since onset.Associated symptoms comments: Leaning to right with walking, left sided paresthesias and weakness, speech diff, left facial droop..    Past Medical History  Diagnosis Date  . Chronic back pain    No past surgical history on file. No family history on file. History  Substance Use Topics  . Smoking status: Current Every Day Smoker -- 0.50 packs/day    Types: Cigarettes  . Smokeless tobacco: Not on file  . Alcohol Use: No   OB History   Grav Para Term Preterm Abortions TAB SAB Ect Mult Living                 Review of Systems  Respiratory: Negative.   Cardiovascular: Negative.   Gastrointestinal: Negative.   Genitourinary: Positive for flank pain.  Neurological: Positive for facial asymmetry, speech difficulty and weakness.    Allergies  Ibuprofen and Penicillins  Home Medications   Current Outpatient Rx  Name  Route  Sig  Dispense  Refill  . cyclobenzaprine (FLEXERIL) 5 MG tablet   Oral   Take 1 tablet (5 mg total) by mouth 3 (three) times daily as needed for muscle spasms.   30 tablet   0   . oxyCODONE-acetaminophen (PERCOCET/ROXICET) 5-325 MG per tablet   Oral   Take 1 tablet by mouth every 4 (four) hours as needed. For pain         . traZODone (DESYREL) 50 MG tablet   Oral   Take 50 mg by mouth at bedtime.          BP 137/86  Pulse 81  Temp(Src) 98.3 F (36.8 C) (Oral)  Resp 16  SpO2 97% Physical Exam  Nursing note and vitals reviewed. Constitutional: She is  oriented to person, place, and time. She appears well-developed and well-nourished.  HENT:  Head: Normocephalic.  Eyes: Conjunctivae are normal. Pupils are equal, round, and reactive to light.  Neck: Normal range of motion. Neck supple.  Musculoskeletal: Normal range of motion.  Neurological: She is alert and oriented to person, place, and time.  Sl slurred speech, left mouth droop, paresthesias left side, weak grip and left leg weaker than right.  Skin: Skin is warm and dry.    ED Course   Procedures (including critical care time)  Labs Reviewed  POCT URINALYSIS DIP (DEVICE) - Abnormal; Notable for the following:    Leukocytes, UA SMALL (*)    All other components within normal limits   No results found. 1. CVA, old, hemiparesis     MDM  Sent for eval of prob right brain cva on sat---left sided weakness, speech problem, poss left facial droop, leaning to right with walking.  Linna Hoff, MD 10/08/12 [phone]

## 2012-10-09 ENCOUNTER — Emergency Department (HOSPITAL_COMMUNITY): Payer: Medicaid Other

## 2012-10-09 ENCOUNTER — Inpatient Hospital Stay (HOSPITAL_COMMUNITY): Payer: Medicaid Other

## 2012-10-09 ENCOUNTER — Encounter (HOSPITAL_COMMUNITY): Payer: Self-pay | Admitting: Radiology

## 2012-10-09 DIAGNOSIS — I10 Essential (primary) hypertension: Secondary | ICD-10-CM

## 2012-10-09 DIAGNOSIS — R109 Unspecified abdominal pain: Secondary | ICD-10-CM

## 2012-10-09 DIAGNOSIS — I6350 Cerebral infarction due to unspecified occlusion or stenosis of unspecified cerebral artery: Principal | ICD-10-CM | POA: Diagnosis present

## 2012-10-09 DIAGNOSIS — I63311 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 cerebral artery: Secondary | ICD-10-CM | POA: Diagnosis present

## 2012-10-09 DIAGNOSIS — I6789 Other cerebrovascular disease: Secondary | ICD-10-CM

## 2012-10-09 LAB — URINE MICROSCOPIC-ADD ON

## 2012-10-09 LAB — URINALYSIS, ROUTINE W REFLEX MICROSCOPIC
Nitrite: NEGATIVE
Protein, ur: NEGATIVE mg/dL
Urobilinogen, UA: 1 mg/dL (ref 0.0–1.0)

## 2012-10-09 LAB — LIPID PANEL
LDL Cholesterol: 181 mg/dL — ABNORMAL HIGH (ref 0–99)
Triglycerides: 126 mg/dL (ref <150)
VLDL: 25 mg/dL (ref 0–40)

## 2012-10-09 LAB — PROTIME-INR: Prothrombin Time: 13.9 seconds (ref 11.6–15.2)

## 2012-10-09 LAB — HEPATITIS B SURFACE ANTIGEN: Hepatitis B Surface Ag: NEGATIVE

## 2012-10-09 LAB — HEPATITIS B CORE ANTIBODY, IGM: Hep B C IgM: NEGATIVE

## 2012-10-09 MED ORDER — SENNOSIDES-DOCUSATE SODIUM 8.6-50 MG PO TABS
1.0000 | ORAL_TABLET | Freq: Every evening | ORAL | Status: DC | PRN
  Filled 2012-10-09: qty 1

## 2012-10-09 MED ORDER — OXYCODONE HCL 5 MG PO TABS
5.0000 mg | ORAL_TABLET | ORAL | Status: DC | PRN
  Administered 2012-10-09 – 2012-10-10 (×2): 5 mg via ORAL
  Filled 2012-10-09 (×2): qty 1

## 2012-10-09 MED ORDER — DEXTROSE 5 % IV SOLN
1.0000 g | Freq: Once | INTRAVENOUS | Status: AC
  Administered 2012-10-09: 1 g via INTRAVENOUS
  Filled 2012-10-09: qty 10

## 2012-10-09 MED ORDER — SIMVASTATIN 20 MG PO TABS
20.0000 mg | ORAL_TABLET | Freq: Every day | ORAL | Status: DC
  Administered 2012-10-09: 20 mg via ORAL
  Filled 2012-10-09 (×2): qty 1

## 2012-10-09 MED ORDER — PNEUMOCOCCAL VAC POLYVALENT 25 MCG/0.5ML IJ INJ
0.5000 mL | INJECTION | INTRAMUSCULAR | Status: AC
  Administered 2012-10-10: 0.5 mL via INTRAMUSCULAR
  Filled 2012-10-09: qty 0.5

## 2012-10-09 MED ORDER — ENOXAPARIN SODIUM 30 MG/0.3ML ~~LOC~~ SOLN
30.0000 mg | Freq: Every day | SUBCUTANEOUS | Status: DC
  Administered 2012-10-09 – 2012-10-10 (×2): 30 mg via SUBCUTANEOUS
  Filled 2012-10-09 (×2): qty 0.3

## 2012-10-09 MED ORDER — MORPHINE SULFATE 4 MG/ML IJ SOLN
4.0000 mg | Freq: Once | INTRAMUSCULAR | Status: AC
  Administered 2012-10-09: 4 mg via INTRAVENOUS
  Filled 2012-10-09: qty 1

## 2012-10-09 MED ORDER — ASPIRIN 300 MG RE SUPP
300.0000 mg | Freq: Once | RECTAL | Status: AC
  Administered 2012-10-09: 300 mg via RECTAL
  Filled 2012-10-09: qty 1

## 2012-10-09 MED ORDER — ASPIRIN 325 MG PO TABS
325.0000 mg | ORAL_TABLET | Freq: Every day | ORAL | Status: DC
  Administered 2012-10-10: 325 mg via ORAL
  Filled 2012-10-09 (×2): qty 1

## 2012-10-09 MED ORDER — ASPIRIN 300 MG RE SUPP
300.0000 mg | Freq: Every day | RECTAL | Status: DC
  Administered 2012-10-09: 300 mg via RECTAL
  Filled 2012-10-09 (×2): qty 1

## 2012-10-09 MED ORDER — DEXTROSE 5 % IV SOLN
1.0000 g | Freq: Every day | INTRAVENOUS | Status: DC
  Administered 2012-10-09: 1 g via INTRAVENOUS
  Filled 2012-10-09 (×2): qty 10

## 2012-10-09 MED ORDER — MORPHINE SULFATE 2 MG/ML IJ SOLN
1.0000 mg | INTRAMUSCULAR | Status: DC | PRN
  Administered 2012-10-09 – 2012-10-10 (×3): 1 mg via INTRAVENOUS
  Filled 2012-10-09 (×3): qty 1

## 2012-10-09 NOTE — Evaluation (Signed)
Clinical/Bedside Swallow Evaluation Patient Details  Name: [name] MRN: [id number] Date of Birth: 09/16/1955  Today's Date: 10/09/2012 Time: 0905-0920 SLP Time Calculation (min): 15 min  Past Medical History:  Past Medical History  Diagnosis Date  . Chronic back pain    Past Surgical History: History reviewed. No pertinent past surgical history. HPI:  57 y.o. female with PMH of chronic back pain, history of liver cirrhosis, hypertension, remote CVA presents to the ER with left leg and arm weakness along with slurring of her speech, dragging her left leg while walking, and in addition also noticed some left flank pain, along with foul-smelling odor and discoloration of urine. CT head in the ER notable for this and it indeterminant infarction in the right pons.  CXR revealed abnormality of the right lower lung which could represent atelectasis or infectious infiltrate   Assessment / Plan / Recommendation Clinical Impression  Pt. exhibited decreased laryngeal elevation during palapation of larynx, suspected delayed swallow and multiple swallows (indicative of possible residuals).  Pt. reported "feeling of food sticking in throat for a long time."  Stroke is in the pons increasing aspiration risk, therefore recommending an MBS which is scheduled for today at 1300.     Aspiration Risk  Severe    Diet Recommendation NPO        Other  Recommendations Recommended Consults: MBS Oral Care Recommendations: Oral care BID   Follow Up Recommendations   (TBD)    Frequency and Duration        Pertinent Vitals/Pain No indications    s     Swallow Study           Oral/Motor/Sensory Function Overall Oral Motor/Sensory Function: Impaired Labial ROM: Reduced left Labial Symmetry: Abnormal symmetry left Labial Strength: Reduced Lingual ROM: Within Functional Limits Lingual Symmetry: Abnormal symmetry left Lingual Strength: Reduced Facial ROM: Reduced left Facial Symmetry: Left  droop Facial Strength: Reduced Mandible: Within Functional Limits   Ice Chips Ice chips: Not tested   Thin Liquid Thin Liquid: Impaired Pharyngeal  Phase Impairments: Decreased hyoid-laryngeal movement;Suspected delayed Swallow;Multiple swallows    Nectar Thick Nectar Thick Liquid: Not tested   Honey Thick Honey Thick Liquid: Not tested   Puree Puree: Impaired Pharyngeal Phase Impairments: Decreased hyoid-laryngeal movement;Multiple swallows   Solid       Solid: Impaired Oral Phase Impairments: Reduced lingual movement/coordination Pharyngeal Phase Impairments: Decreased hyoid-laryngeal movement;Multiple swallows       Breck Coons Mindi Akerson M.Ed [employer] [phone]  10/09/2012

## 2012-10-09 NOTE — ED Notes (Signed)
Admitting MD in room with pt. 

## 2012-10-09 NOTE — Procedures (Signed)
Objective Swallowing Evaluation: Modified Barium Swallowing Study  Patient Details  Name: [name] MRN: [id number] Date of Birth: 1955/11/29  Today's Date: 10/09/2012 Time: 7253-6644 SLP Time Calculation (min): 25 min  Past Medical History:  Past Medical History  Diagnosis Date  . Chronic back pain   . MDD (major depressive disorder)   . Alcohol abuse   . PTSD (post-traumatic stress disorder)   . UTI (lower urinary tract infection)   . Colitis    Past Surgical History: History reviewed. No pertinent past surgical history. HPI:  57 y.o. female with PMH of chronic back pain, history of liver cirrhosis, hypertension, remote CVA presents to the ER with left leg and arm weakness along with slurring of her speech, dragging her left leg while walking, and in addition also noticed some left flank pain, along with foul-smelling odor and discoloration of urine. CT head in the ER notable for this and it indeterminant infarction in the right pons.  CXR revealed abnormality of the right lower lung which could represent atelectasis or infectious infiltrate.  BSE recommended an MBS.     Assessment / Plan / Recommendation Clinical Impression  Dysphagia Diagnosis: Mild pharyngeal phase dysphagia Clinical impression: Pt. exhibited mild pharyngeal phase dysphagia primarily due to reduced sensory tract leading to intermittent penetration with thin liquids (both flash and remained in vestibule).  Initially, pt. continued to penetrate despite chin tuck posture, however chin tuck was re-implemented at end of study which prevented penetration possibly due to increased muscle strength as the study progressed.  SLP recommends Dys 3 (energy conservation) and thin liquids with chin tuck.  SLP will continue to follow.         Treatment Recommendation  Therapy as outlined in treatment plan below    Diet Recommendation Dysphagia 3 (Mechanical Soft);Thin liquid   Liquid Administration via: Cup;No  straw Medication Administration: Whole meds with puree Supervision: Patient able to self feed;Intermittent supervision to cue for compensatory strategies Compensations: Slow rate;Small sips/bites;Clear throat intermittently Postural Changes and/or Swallow Maneuvers: Chin tuck;Seated upright 90 degrees    Other  Recommendations Oral Care Recommendations: Oral care BID   Follow Up Recommendations   (TBD)    Frequency and Duration min 2x/week  2 weeks   Pertinent Vitals/Pain     SLP Swallow Goals Patient will utilize recommended strategies during swallow to increase swallowing safety with: Minimal cueing   General HPI: 57 y.o. female with PMH of chronic back pain, history of liver cirrhosis, hypertension, remote CVA presents to the ER with left leg and arm weakness along with slurring of her speech, dragging her left leg while walking, and in addition also noticed some left flank pain, along with foul-smelling odor and discoloration of urine. CT head in the ER notable for this and it indeterminant infarction in the right pons.  CXR revealed abnormality of the right lower lung which could represent atelectasis or infectious infiltrate.  BSE recommended an MBS. Type of Study: Modified Barium Swallowing Study Reason for Referral: Objectively evaluate swallowing function Diet Prior to this Study: NPO Temperature Spikes Noted: No Respiratory Status: Room air History of Recent Intubation: No Behavior/Cognition: Alert;Cooperative;Pleasant mood Oral Cavity - Dentition: Adequate natural dentition Oral Motor / Sensory Function: Impaired - see Bedside swallow eval Patient Positioning: Upright in chair Anatomy: Within functional limits Pharyngeal Secretions: Not observed secondary MBS    Reason for Referral Objectively evaluate swallowing function   Oral Phase Oral Preparation/Oral Phase Oral Phase: WFL   Pharyngeal Phase Pharyngeal Phase Pharyngeal  Phase: Impaired Pharyngeal -  Nectar Pharyngeal - Nectar Teaspoon: Delayed swallow initiation;Premature spillage to pyriform sinuses Pharyngeal - Nectar Cup: Delayed swallow initiation;Premature spillage to pyriform sinuses Pharyngeal - Thin Pharyngeal - Thin Teaspoon: Penetration/Aspiration during swallow;Reduced airway/laryngeal closure;Reduced laryngeal elevation Penetration/Aspiration details (thin teaspoon): Material enters airway, remains ABOVE vocal cords then ejected out Pharyngeal - Thin Cup: Penetration/Aspiration during swallow;Reduced laryngeal elevation;Reduced tongue base retraction Penetration/Aspiration details (thin cup): Material enters airway, remains ABOVE vocal cords and not ejected out;Material enters airway, remains ABOVE vocal cords then ejected out Pharyngeal - Solids Pharyngeal - Regular: Within functional limits  Cervical Esophageal Phase    GO    Cervical Esophageal Phase Cervical Esophageal Phase: Impaired Cervical Esophageal Phase - Comment Cervical Esophageal Comment:  (trace residue in below UES)         Royce Macadamia M.Ed [employer] [phone]  10/09/2012

## 2012-10-09 NOTE — Consult Note (Signed)
Referring Physician: Blake Divine    Chief Complaint: left leg weakness and pain  HPI:                                                                                                                                         [name] is an 57 y.o. female who woke up on Saturday  ( 3 days ago) noting left arm and leg weakness, imbalance and a feeling of dizziness. Patient did not seek attention at that time. Due to symptoms not improving and addition al symptoms of flank discomfort and foul smelling urine, she went to urgent care. 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 Neurology was consulted for abnormal CT findings. 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   Date last known well: Saturday 7.26.14 Time last known well: Unable to determine tPA Given: No: out of window   Past Medical History  Diagnosis Date  . Chronic back pain   . MDD (major depressive disorder)   . Alcohol abuse   . PTSD (post-traumatic stress disorder)   . UTI (lower urinary tract infection)   . Colitis     History reviewed. No pertinent past surgical history.  No family history on file. Social History:  reports that she has been smoking Cigarettes.  She has been smoking about 0.50 packs per day. She does not have any smokeless tobacco history on file. She reports that she does not drink alcohol or use illicit drugs.  Allergies:  Allergies  Allergen Reactions  . Ibuprofen     Unknown  . Penicillins     Unknown    Medications:                                                                                                                           Prior to Admission:  Prescriptions prior to admission  Medication Sig Dispense Refill  . cyclobenzaprine (FLEXERIL) 5 MG tablet Take 1 tablet (5 mg total) by mouth 3 (three) times daily as needed for muscle spasms.  30 tablet  0  . oxyCODONE-acetaminophen (PERCOCET/ROXICET)  5-325 MG per tablet Take 1 tablet by mouth every 4 (four) hours as needed. For pain       Scheduled: . aspirin  300 mg Rectal Daily   Or  .  aspirin  325 mg Oral Daily  . cefTRIAXone (ROCEPHIN) IVPB 1 gram/50 mL D5W  1 g Intravenous QHS  . enoxaparin (LOVENOX) injection  30 mg Subcutaneous Daily  . [START ON 10/10/2012] pneumococcal 23 valent vaccine  0.5 mL Intramuscular Tomorrow-1000  . simvastatin  20 mg Oral q1800    ROS:                                                                                                                                       History obtained from the patient  General ROS: negative for - chills, fatigue, fever, night sweats, weight gain or weight loss Psychological ROS: negative for - behavioral disorder, hallucinations, memory difficulties, mood swings or suicidal ideation Ophthalmic ROS: negative for - blurry vision, double vision, eye pain or loss of vision ENT ROS: negative for - epistaxis, nasal discharge, oral lesions, sore throat, tinnitus or vertigo Allergy and Immunology ROS: negative for - hives or itchy/watery eyes Hematological and Lymphatic ROS: negative for - bleeding problems, bruising or swollen lymph nodes Endocrine ROS: negative for - galactorrhea, hair pattern changes, polydipsia/polyuria or temperature intolerance Respiratory ROS: negative for - cough, hemoptysis, shortness of breath or wheezing Cardiovascular ROS: negative for - chest pain, dyspnea on exertion, edema or irregular heartbeat Gastrointestinal ROS: negative for - abdominal pain, diarrhea, hematemesis, nausea/vomiting or stool incontinence Genito-Urinary ROS: negative for - dysuria, hematuria, incontinence or urinary frequency/urgency Musculoskeletal ROS: negative for - joint swelling or muscular weakness Neurological ROS: as noted in HPI Dermatological ROS: negative for rash and skin lesion changes  Neurologic Examination:                                                                                                       Blood pressure 142/84, pulse 71, temperature 97.9 F (36.6 C), temperature source Oral, resp. rate 18, height 5\' 3"  (1.6 m), weight 60.102 kg (132 lb 8 oz), SpO2 98.00%.  Mental Status: Alert, oriented, thought content appropriate.  Speech dysarthric without evidence of aphasia.  Able to follow 3 step commands without difficulty. Cranial Nerves: II: Discs flat bilaterally; Visual fields grossly normal, pupils equal, round, reactive to light and accommodation III,IV, VI: ptosis not present, extra-ocular motions intact bilaterally V,VII: smile asymmetric on the left, facial light touch sensation normal bilaterally VIII: hearing normal bilaterally IX,X: gag reflex present XI: bilateral shoulder shrug XII: midline tongue extension Motor: Right : Upper extremity   5/5    Left:     Upper  extremity   4/5  Lower extremity   5/5     Lower extremity   4/5 --drift on left arm and leg Tone and bulk:normal tone throughout; no atrophy noted Sensory: Pinprick and light touch intact throughout, bilaterally Deep Tendon Reflexes:  Right: Upper Extremity   Left: Upper extremity   biceps (C-5 to C-6) 2/4   biceps (C-5 to C-6) 2/4 tricep (C7) 2/4    triceps (C7) 2/4 Brachioradialis (C6) 2/4  Brachioradialis (C6) 2/4  Lower Extremity Lower Extremity  quadriceps (L-2 to L-4) 2/4   quadriceps (L-2 to L-4) 2/4 Achilles (S1) 1/4   Achilles (S1) 1/4  Plantars: Right: downgoing   Left: upgoing Cerebellar: normal finger-to-nose,  normal heel-to-shin test CV: pulses palpable throughout    Results for orders placed during the hospital encounter of 10/08/12 (from the past 48 hour(s))  CBC WITH DIFFERENTIAL     Status: Abnormal   Collection Time    10/08/12  8:03 PM      Result Value Range   WBC 6.8  4.0 - 10.5 K/uL   RBC 4.10  3.87 - 5.11 MIL/uL   Hemoglobin 13.3  12.0 - 15.0 g/dL   HCT 16.1  09.6 - 04.5 %   MCV 92.9  78.0 - 100.0 fL   MCH 32.4  26.0 -  34.0 pg   MCHC 34.9  30.0 - 36.0 g/dL   RDW 40.9  81.1 - 91.4 %   Platelets 148 (*) 150 - 400 K/uL   Neutrophils Relative % 40 (*) 43 - 77 %   Neutro Abs 2.8  1.7 - 7.7 K/uL   Lymphocytes Relative 50 (*) 12 - 46 %   Lymphs Abs 3.4  0.7 - 4.0 K/uL   Monocytes Relative 9  3 - 12 %   Monocytes Absolute 0.6  0.1 - 1.0 K/uL   Eosinophils Relative 0  0 - 5 %   Eosinophils Absolute 0.0  0.0 - 0.7 K/uL   Basophils Relative 0  0 - 1 %   Basophils Absolute 0.0  0.0 - 0.1 K/uL  COMPREHENSIVE METABOLIC PANEL     Status: Abnormal   Collection Time    10/08/12  8:03 PM      Result Value Range   Sodium 141  135 - 145 mEq/L   Potassium 3.5  3.5 - 5.1 mEq/L   Chloride 105  96 - 112 mEq/L   CO2 27  19 - 32 mEq/L   Glucose, Bld 87  70 - 99 mg/dL   BUN 10  6 - 23 mg/dL   Creatinine, Ser 7.82  0.50 - 1.10 mg/dL   Calcium 95.6  8.4 - 21.3 mg/dL   Total Protein 8.4 (*) 6.0 - 8.3 g/dL   Albumin 4.4  3.5 - 5.2 g/dL   AST 27  0 - 37 U/L   ALT 20  0 - 35 U/L   Alkaline Phosphatase 69  39 - 117 U/L   Total Bilirubin 0.4  0.3 - 1.2 mg/dL   GFR calc non Af Amer >90  >90 mL/min   GFR calc Af Amer >90  >90 mL/min   Comment:            The eGFR has been calculated     using the CKD EPI equation.     This calculation has not been     validated in all clinical     situations.     eGFR's persistently     <90  mL/min signify     possible Chronic Kidney Disease.  PROTIME-INR     Status: None   Collection Time    10/09/12 12:23 AM      Result Value Range   Prothrombin Time 13.9  11.6 - 15.2 seconds   INR 1.09  0.00 - 1.49  APTT     Status: None   Collection Time    10/09/12 12:23 AM      Result Value Range   aPTT 31  24 - 37 seconds  URINALYSIS, ROUTINE W REFLEX MICROSCOPIC     Status: Abnormal   Collection Time    10/09/12  1:41 AM      Result Value Range   Color, Urine YELLOW  YELLOW   APPearance CLOUDY (*) CLEAR   Specific Gravity, Urine 1.023  1.005 - 1.030   pH 6.0  5.0 - 8.0   Glucose, UA  NEGATIVE  NEGATIVE mg/dL   Hgb urine dipstick NEGATIVE  NEGATIVE   Bilirubin Urine NEGATIVE  NEGATIVE   Ketones, ur NEGATIVE  NEGATIVE mg/dL   Protein, ur NEGATIVE  NEGATIVE mg/dL   Urobilinogen, UA 1.0  0.0 - 1.0 mg/dL   Nitrite NEGATIVE  NEGATIVE   Leukocytes, UA LARGE (*) NEGATIVE  URINE MICROSCOPIC-ADD ON     Status: Abnormal   Collection Time    10/09/12  1:41 AM      Result Value Range   Squamous Epithelial / LPF RARE  RARE   WBC, UA 7-10  <3 WBC/hpf   Bacteria, UA MANY (*) RARE   Urine-Other MUCOUS PRESENT    HEMOGLOBIN A1C     Status: None   Collection Time    10/09/12  4:50 AM      Result Value Range   Hemoglobin A1C 5.2  <5.7 %   Comment: (NOTE)                                                                               According to the ADA Clinical Practice Recommendations for 2011, when     HbA1c is used as a screening test:      >=6.5%   Diagnostic of Diabetes Mellitus               (if abnormal result is confirmed)     5.7-6.4%   Increased risk of developing Diabetes Mellitus     References:Diagnosis and Classification of Diabetes Mellitus,Diabetes     Care,2011,34(Suppl 1):S62-S69 and Standards of Medical Care in             Diabetes - 2011,Diabetes Care,2011,34 (Suppl 1):S11-S61.   Mean Plasma Glucose 103  <117 mg/dL  LIPID PANEL     Status: Abnormal   Collection Time    10/09/12  4:50 AM      Result Value Range   Cholesterol 247 (*) 0 - 200 mg/dL   Triglycerides 213  <086 mg/dL   HDL 41  >57 mg/dL   Total CHOL/HDL Ratio 6.0     VLDL 25  0 - 40 mg/dL   LDL Cholesterol 846 (*) 0 - 99 mg/dL   Comment:            Total Cholesterol/HDL:CHD  Risk     Coronary Heart Disease Risk Table                         Men   Women      1/2 Average Risk   3.4   3.3      Average Risk       5.0   4.4      2 X Average Risk   9.6   7.1      3 X Average Risk  23.4   11.0                Use the calculated Patient Ratio     above and the CHD Risk Table     to determine  the patient's CHD Risk.                ATP III CLASSIFICATION (LDL):      <100     mg/dL   Optimal      409-811  mg/dL   Near or Above                        Optimal      130-159  mg/dL   Borderline      914-782  mg/dL   High      >956     mg/dL   Very High  HEPATITIS A ANTIBODY, IGM     Status: None   Collection Time    10/09/12  4:50 AM      Result Value Range   Hep A IgM NEGATIVE  NEGATIVE  HEPATITIS B CORE ANTIBODY, IGM     Status: None   Collection Time    10/09/12  4:50 AM      Result Value Range   Hep B C IgM NEGATIVE  NEGATIVE   Comment: (NOTE)     High levels of Hepatitis B Core IgM antibody are detectable     during the acute stage of Hepatitis B. This antibody is used     to differentiate current from past HBV infection.   Dg Chest 2 View  10/09/2012   *RADIOLOGY REPORT*  Clinical Data: Cough, cold, flank pain.  CHEST - 2 VIEW  Comparison: 03/26/2012.  Findings: Abnormal aeration at the right base, with streaky opacities and elevation of the right diaphragm.  The lower lungs appear more normal in the lateral projection.  No cardiomegaly. Aortic arch atherosclerosis.  No acute osseous findings.  No effusion or pneumothorax.  IMPRESSION: Abnormality of the right lower lung which could represent atelectasis or infectious infiltrate.   Original Report Authenticated By: Tiburcio Pea   Ct Head Wo Contrast  10/09/2012   *RADIOLOGY REPORT*  Clinical Data: Left sided paresthesias and weakness.  Speech difficulty and left facial droop.  CT HEAD WITHOUT CONTRAST  Technique:  Contiguous axial images were obtained from the base of the skull through the vertex without contrast.  Comparison: 12/31/2008.  Findings:  Calvarium:No acute osseous abnormality. No lytic or blastic lesion.  Orbits: No acute abnormality.  Sinuses:  Effusion filling the majority of the imaged left maxillary sinus.  No infiltration of periantral fat.  Brain: No definite abnormality to explain all the reported  deficits.  There is a new low attenuation area within the lower right pons, approximately 8 mm.  Unchanged linear low attenuation in the external capsules bilaterally, previous perforator infarctions or toxic/metabolic injury.  No evidence of  acute hemorrhage, hydrocephalus, mass lesion/shift.  Intracranial calcific atherosclerosis.  Chronic, mild encephalomalacia in the superior left cerebellum.  These results were called by telephone on 10/09/2012 at 01:10 a.m. to Dr Blinda Leatherwood, who verbally acknowledged these results.  IMPRESSION: 1.  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  2.  Large left maxillary sinus effusion, mostly likely acute sinusitis.   Original Report Authenticated By: Tiburcio Pea   LDL --181 A1c--5.2   Vascular Ultrasound  Carotid Duplex (Doppler) has been completed. Preliminary findings: Bilateral: Less than 39% ICA stenosis. Vertebral artery flow is antegrade.     Assessment: 57 y.o. female with right pontine lacunar infarct    Stroke Risk Factors - none  Plan: 1. LDL is >70 would recommend statin 2. Echocardiogram--pending results 3. Prophylactic therapy-Antiplatelet med: Aspirin - dose 81 mg daily 4. Risk factor modification 5. Telemetry monitoring 6. Frequent neuro checks 7. PT/OT --SLP (dysphagia 3 diet recommended) 8. Stroke team to follow in AM  Felicie Morn PA-C [facility] [phone]  I personally participate in this patient's evaluation and management, including formulating the above clinical assessment and management recommendations.  Venetia Maxon M.D. [facility] [phone]

## 2012-10-09 NOTE — Progress Notes (Signed)
[facility] PROGRESS NOTE  [name] ZOX:[id number] DOB: [birth year] DOA: 10/08/2012 PCP: Lonia Blood, MD Brief HPI:  [name] is a 57 y.o. female with PMH of chronic back pain, history of liver cirrhosis, hypertension, remote CVA presents to the ER with multiple complaints. On Saturday she noticed left leg and arm weakness along with slurring of her speech, didn't think much of it and thought she would get better.  Through the weekend kept dragging her left leg while walking, and in addition also noticed some left flank pain, along with foul-smelling odor and discoloration of urine. She went to the urgent care  and was referred to the ER for further evaluation and management.  CT head in the ER notable for this and it indeterminant infarction in the right pons and [facility] was consulted for further evaluation and management  Assessment/Plan: 1. Right pons lacunar infarct : - admitted to tele and stroke work up in progress.  - neurology on board - slp recommended dysphagia 3 diet.   2. ?pyelonephritis:  -  Urine cultures pending.  - on rocephin.    3. Chronic back pain.  4. Liver cirrhosis.   5. Hypertension: allow permissive hypertension.   Code Status: Full code  Family Communication: none at bedside  Disposition Plan: pending PT eval.     Consultants:  neurology Procedures: CT HEAD MRI AND MRA BRAIN ECHO CAROTID DUPLEX. Antibiotics: Rocephin 7/29 HPI/Subjective: No chest pain or sob.  Objective: Filed Vitals:   10/09/12 0547 10/09/12 0852 10/09/12 1433 10/09/12 1601  BP: 134/72 142/84 128/72 133/75  Pulse:  71 75 73  Temp: 97.9 F (36.6 C) 97.9 F (36.6 C) 98.6 F (37 C) 97.8 F (36.6 C)  TempSrc: Oral Oral Oral Oral  Resp: 16 18 18 18   Height:      Weight:      SpO2: 97% 98% 98% 98%   No intake or output data in the 24 hours ending 10/09/12 1717 Filed Weights   10/08/12 2002 10/09/12 0347  Weight: 59.421 kg (131 lb) 60.102 kg (132 lb 8 oz)     Exam: Alert afebrile comfortable.  CVS S1-S2 regular rate rhythm no murmurs rubs or gallops  Lungs clear to auscultation laterally  Abdomen soft nontender with normal bowel sounds organomegaly  Extremities no edema clubbing or cyanosis   Neuro cranial nerves: Facial nerve palsy Motor: Strength 4/5 in left upper and lower extremity   Data Reviewed: Basic Metabolic Panel:  Recent Labs Lab 10/08/12 2003  NA 141  K 3.5  CL 105  CO2 27  GLUCOSE 87  BUN 10  CREATININE 0.76  CALCIUM 10.2   Liver Function Tests:  Recent Labs Lab 10/08/12 2003  AST 27  ALT 20  ALKPHOS 69  BILITOT 0.4  PROT 8.4*  ALBUMIN 4.4   No results found for this basename: LIPASE, AMYLASE,  in the last 168 hours No results found for this basename: AMMONIA,  in the last 168 hours CBC:  Recent Labs Lab 10/08/12 2003  WBC 6.8  NEUTROABS 2.8  HGB 13.3  HCT 38.1  MCV 92.9  PLT 148*   Cardiac Enzymes: No results found for this basename: CKTOTAL, CKMB, CKMBINDEX, TROPONINI,  in the last 168 hours BNP (last 3 results) No results found for this basename: PROBNP,  in the last 8760 hours CBG: No results found for this basename: GLUCAP,  in the last 168 hours  No results found for this or any previous visit (from the past 240 hour(s)).  Studies: Dg Chest 2 View  10/09/2012   *RADIOLOGY REPORT*  Clinical Data: Cough, cold, flank pain.  CHEST - 2 VIEW  Comparison: 03/26/2012.  Findings: Abnormal aeration at the right base, with streaky opacities and elevation of the right diaphragm.  The lower lungs appear more normal in the lateral projection.  No cardiomegaly. Aortic arch atherosclerosis.  No acute osseous findings.  No effusion or pneumothorax.  IMPRESSION: Abnormality of the right lower lung which could represent atelectasis or infectious infiltrate.   Original Report Authenticated By: Tiburcio Pea   Ct Head Wo Contrast  10/09/2012   *RADIOLOGY REPORT*  Clinical Data: Left sided paresthesias  and weakness.  Speech difficulty and left facial droop.  CT HEAD WITHOUT CONTRAST  Technique:  Contiguous axial images were obtained from the base of the skull through the vertex without contrast.  Comparison: 12/31/2008.  Findings:  Calvarium:No acute osseous abnormality. No lytic or blastic lesion.  Orbits: No acute abnormality.  Sinuses:  Effusion filling the majority of the imaged left maxillary sinus.  No infiltration of periantral fat.  Brain: No definite abnormality to explain all the reported deficits.  There is a new low attenuation area within the lower right pons, approximately 8 mm.  Unchanged linear low attenuation in the external capsules bilaterally, previous perforator infarctions or toxic/metabolic injury.  No evidence of acute hemorrhage, hydrocephalus, mass lesion/shift.  Intracranial calcific atherosclerosis.  Chronic, mild encephalomalacia in the superior left cerebellum.  These results were called by telephone on 10/09/2012 at 01:10 a.m. to Dr Blinda Leatherwood, who verbally acknowledged these results.  IMPRESSION: 1.  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  2.  Large left maxillary sinus effusion, mostly likely acute sinusitis.   Original Report Authenticated By: Tiburcio Pea   Mr Brain Wo Contrast  10/09/2012   *RADIOLOGY REPORT*  Clinical Data:  Stroke.  Left-sided weakness  MRI HEAD WITHOUT CONTRAST MRA HEAD WITHOUT CONTRAST  Technique:  Multiplanar, multiecho pulse sequences of the brain and surrounding structures were obtained without intravenous contrast. Angiographic images of the head were obtained using MRA technique without contrast.  Comparison:  CT 10/09/2012  MRI HEAD  Findings:  Moderately large acute infarct right pons involving the anterior and mid pons.  No other acute infarct.  Chronic infarcts in the external capsule and thalami bilaterally. Mild chronic microvascular ischemic change in the cerebral white matter.  Negative for  hemorrhage or mass.  Ventricle size is normal.  Mucosal thickening and   air-fluid level with frothy secretions in the left maxillary sinus.  IMPRESSION: Acute infarct in the right paramedian pons.  Chronic ischemic change in the external capsule and thalami bilaterally.  Sinusitis with air-fluid level and secretions in the left maxillary sinus.  MRA HEAD  Findings: The   left vertebral artery is widely patent to the basilar.  Left PICA is patent.  Right vertebral artery is nondominant and ends in pica.  There is a moderate to severe stenosis in the mid basilar.  The distal basilar is patent.  Superior cerebellar and posterior cerebral arteries are patent.  Mild atherosclerotic disease in the posterior cerebral arteries bilaterally.  Internal carotid artery is widely patent bilaterally without stenosis.  Anterior cerebral arteries are patent bilaterally. There is mild stenosis in the middle cerebral artery bifurcation bilaterally.  No large vessel occlusion or aneurysm.  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  Mild disease in the middle cerebral artery bifurcation bilaterally.  Original Report Authenticated By: Janeece Riggers, M.D.   Dg Swallowing Func-speech Pathology  10/09/2012   Breck Coons [place], CCC-SLP     10/09/2012  2:27 PM Objective Swallowing Evaluation: Modified Barium Swallowing Study   Patient Details  Name: [name] MRN: [id number] Date of Birth: 02-20-1956  Today's Date: 10/09/2012 Time: 6578-4696 SLP Time Calculation (min): 25 min  Past Medical History:  Past Medical History  Diagnosis Date  . Chronic back pain   . MDD (major depressive disorder)   . Alcohol abuse   . PTSD (post-traumatic stress disorder)   . UTI (lower urinary tract infection)   . Colitis    Past Surgical History: History reviewed. No pertinent past  surgical history. HPI:  57 y.o. female with PMH of chronic back pain, history of liver  cirrhosis, hypertension, remote CVA presents to the ER with left  leg  and arm weakness along with slurring of her speech, dragging  her left leg while walking, and in addition also noticed some  left flank pain, along with foul-smelling odor and discoloration  of urine. CT head in the ER notable for this and it indeterminant  infarction in the right pons.  CXR revealed abnormality of the  right lower lung which could represent atelectasis or infectious  infiltrate.  BSE recommended an MBS.     Assessment / Plan / Recommendation Clinical Impression  Dysphagia Diagnosis: Mild pharyngeal phase dysphagia Clinical impression: Pt. exhibited mild pharyngeal phase  dysphagia primarily due to reduced sensory tract leading to  intermittent penetration with thin liquids (both flash and  remained in vestibule).  Initially, pt. continued to penetrate  despite chin tuck posture, however chin tuck was re-implemented  at end of study which prevented penetration possibly due to  increased muscle strength as the study progressed.  SLP  recommends Dys 3 (energy conservation) and thin liquids with chin  tuck.  SLP will continue to follow.         Treatment Recommendation  Therapy as outlined in treatment plan below    Diet Recommendation Dysphagia 3 (Mechanical Soft);Thin liquid   Liquid Administration via: Cup;No straw Medication Administration: Whole meds with puree Supervision: Patient able to self feed;Intermittent supervision  to cue for compensatory strategies Compensations: Slow rate;Small sips/bites;Clear throat  intermittently Postural Changes and/or Swallow Maneuvers: Chin tuck;Seated  upright 90 degrees    Other  Recommendations Oral Care Recommendations: Oral care BID   Follow Up Recommendations   (TBD)    Frequency and Duration min 2x/week  2 weeks   Pertinent Vitals/Pain     SLP Swallow Goals Patient will utilize recommended strategies during swallow to  increase swallowing safety with: Minimal cueing   General HPI: 57 y.o. female with PMH of chronic back pain,  history of liver cirrhosis,  hypertension, remote CVA presents to  the ER with left leg and arm weakness along with slurring of her  speech, dragging her left leg while walking, and in addition also  noticed some left flank pain, along with foul-smelling odor and  discoloration of urine. CT head in the ER notable for this and it  indeterminant infarction in the right pons.  CXR revealed  abnormality of the right lower lung which could represent  atelectasis or infectious infiltrate.  BSE recommended an MBS. Type of Study: Modified Barium Swallowing Study Reason for Referral: Objectively evaluate swallowing function Diet Prior to this Study: NPO Temperature Spikes Noted: No Respiratory Status: Room air History of Recent Intubation: No Behavior/Cognition: Alert;Cooperative;Pleasant mood  Oral Cavity - Dentition: Adequate natural dentition Oral Motor / Sensory Function: Impaired - see Bedside swallow  eval Patient Positioning: Upright in chair Anatomy: Within functional limits Pharyngeal Secretions: Not observed secondary MBS    Reason for Referral Objectively evaluate swallowing function   Oral Phase Oral Preparation/Oral Phase Oral Phase: WFL   Pharyngeal Phase Pharyngeal Phase Pharyngeal Phase: Impaired Pharyngeal - Nectar Pharyngeal - Nectar Teaspoon: Delayed swallow  initiation;Premature spillage to pyriform sinuses Pharyngeal - Nectar Cup: Delayed swallow initiation;Premature  spillage to pyriform sinuses Pharyngeal - Thin Pharyngeal - Thin Teaspoon: Penetration/Aspiration during  swallow;Reduced airway/laryngeal closure;Reduced laryngeal  elevation Penetration/Aspiration details (thin teaspoon): Material enters  airway, remains ABOVE vocal cords then ejected out Pharyngeal - Thin Cup: Penetration/Aspiration during  swallow;Reduced laryngeal elevation;Reduced tongue base  retraction Penetration/Aspiration details (thin cup): Material enters  airway, remains ABOVE vocal cords and not ejected out;Material  enters airway, remains ABOVE vocal  cords then ejected out Pharyngeal - Solids Pharyngeal - Regular: Within functional limits  Cervical Esophageal Phase    GO    Cervical Esophageal Phase Cervical Esophageal Phase: Impaired Cervical Esophageal Phase - Comment Cervical Esophageal Comment:  (trace residue in below UES)         Royce Macadamia M.Ed CCC-SLP Pager [phone]  10/09/2012   Mr Maxine Glenn Head/brain Wo Cm  10/09/2012   *RADIOLOGY REPORT*  Clinical Data:  Stroke.  Left-sided weakness  MRI HEAD WITHOUT CONTRAST MRA HEAD WITHOUT CONTRAST  Technique:  Multiplanar, multiecho pulse sequences of the brain and surrounding structures were obtained without intravenous contrast. Angiographic images of the head were obtained using MRA technique without contrast.  Comparison:  CT 10/09/2012  MRI HEAD  Findings:  Moderately large acute infarct right pons involving the anterior and mid pons.  No other acute infarct.  Chronic infarcts in the external capsule and thalami bilaterally. Mild chronic microvascular ischemic change in the cerebral white matter.  Negative for hemorrhage or mass.  Ventricle size is normal.  Mucosal thickening and   air-fluid level with frothy secretions in the left maxillary sinus.  IMPRESSION: Acute infarct in the right paramedian pons.  Chronic ischemic change in the external capsule and thalami bilaterally.  Sinusitis with air-fluid level and secretions in the left maxillary sinus.  MRA HEAD  Findings: The   left vertebral artery is widely patent to the basilar.  Left PICA is patent.  Right vertebral artery is nondominant and ends in pica.  There is a moderate to severe stenosis in the mid basilar.  The distal basilar is patent.  Superior cerebellar and posterior cerebral arteries are patent.  Mild atherosclerotic disease in the posterior cerebral arteries bilaterally.  Internal carotid artery is widely patent bilaterally without stenosis.  Anterior cerebral arteries are patent bilaterally. There is mild stenosis in the middle  cerebral artery bifurcation bilaterally.  No large vessel occlusion or aneurysm.  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  Mild disease in the middle cerebral artery bifurcation bilaterally.   Original Report Authenticated By: Janeece Riggers, M.D.    Scheduled Meds: . aspirin  300 mg Rectal Daily   Or  . aspirin  325 mg Oral Daily  . cefTRIAXone (ROCEPHIN) IVPB 1 gram/50 mL D5W  1 g Intravenous QHS  . enoxaparin (LOVENOX) injection  30 mg Subcutaneous Daily  . [START ON 10/10/2012] pneumococcal 23 valent vaccine  0.5 mL Intramuscular Tomorrow-1000  . simvastatin  20 mg Oral q1800   Continuous Infusions:   Active Problems:  HYPERTENSION   CVA (cerebral infarction)   Flank pain   Right pontine stroke        [facility]  [facility] Pager [phone]. If 7PM-7AM, please contact night-coverage at [email or web address], password [facility] 10/09/2012, 5:17 PM  LOS: 1 day

## 2012-10-09 NOTE — Progress Notes (Signed)
  Echocardiogram 2D Echocardiogram has been performed.  Jorje Guild 10/09/2012, 12:59 PM

## 2012-10-09 NOTE — Progress Notes (Signed)
*  PRELIMINARY RESULTS* Vascular Ultrasound Carotid Duplex (Doppler) has been completed.  Preliminary findings: Bilateral:  Less than 39% ICA stenosis.  Vertebral artery flow is antegrade.      Farrel Demark, RDMS, RVT  10/09/2012, 11:51 AM

## 2012-10-09 NOTE — H&P (Signed)
[facility] History and Physical  Jayonna Meyering ONG:[id number] DOB: [birth year] DOA: 10/08/2012  Referring physician: EDP PCP: Lonia Blood, MD   Chief Complaint: L flank pain and L leg weakness  HPI: [name] is a 57 y.o. female with PMH of chronic back pain, history of liver cirrhosis, hypertension, remote CVA presents to the ER with multiple complaints. On Saturday she noticed left leg and arm weakness along with slurring of her speech, didn't think much of it and thought she would get better. Through the weekend kept dragging her left leg while walking, and in addition also noticed some left flank pain, along with foul-smelling odor and discoloration of urine. She went to the urgent care today and was referred to the ER for further evaluation and management. CT head in the ER notable for this and it indeterminant infarction in the right pons and [facility] was consulted for further evaluation and management  Review of Systems: The patient denies anorexia, fever, weight loss,, vision loss, decreased hearing, hoarseness, chest pain, syncope, dyspnea on exertion, peripheral edema, balance deficits, hemoptysis, abdominal pain, melena, hematochezia, severe indigestion/heartburn, hematuria, incontinence, genital sores, muscle weakness, suspicious skin lesions, transient blindness, difficulty walking, depression, unusual weight change, abnormal bleeding, enlarged lymph nodes, angioedema, and breast masses.    Past Medical History  Diagnosis Date  . Chronic back pain Liver cirrhosis        HTN      Depression      GERD      CVA  History reviewed. No pertinent past surgical history. Social History:  reports that she has been smoking Cigarettes.  She has been smoking about 0.50 packs per day. She does not have any smokeless tobacco history on file. She reports that she does not drink alcohol or use illicit drugs. Lives at home with neice  Allergies  Allergen Reactions  . Ibuprofen      Unknown  . Penicillins     Unknown    family history: Reviewed and unknown per patient  Prior to Admission medications   Medication Sig Start Date End Date Taking? Authorizing Provider  cyclobenzaprine (FLEXERIL) 5 MG tablet Take 1 tablet (5 mg total) by mouth 3 (three) times daily as needed for muscle spasms. 03/26/12  Yes Arman Filter, NP  oxyCODONE-acetaminophen (PERCOCET/ROXICET) 5-325 MG per tablet Take 1 tablet by mouth every 4 (four) hours as needed. For pain   Yes Historical Provider, MD   Physical Exam: Filed Vitals:   10/08/12 2345 10/09/12 0000 10/09/12 0100 10/09/12 0304  BP: 154/88 157/79 127/53 128/61  Pulse:  77 73 67  Temp:      TempSrc:      Resp: 14 19 17 15   Height:      Weight:      SpO2:  100% 100% 99%     General: Alert awake oriented to self place and person, slurred speech, facial droop noted  HEENT: PERRLA, EOMI  CVS S1-S2 regular rate rhythm no murmurs rubs or gallops  Lungs clear to auscultation laterally  Abdomen soft nontender with normal bowel sounds organomegaly  Extremities no edema clubbing or cyanosis  Skin no rashes or skin breakdown exam psychiatric appropriate mood and affect  Neuro cranial nerves: Facial nerve palsy  Motor: Strength 4/5 in left upper and lower extremity  Sensory light touch intact bilaterally  DTRs: 2+ bilaterally  Plantars withdrawal bilaterally    Labs on Admission:  Basic Metabolic Panel:  Recent Labs Lab 10/08/12 2003  NA 141  K  3.5  CL 105  CO2 27  GLUCOSE 87  BUN 10  CREATININE 0.76  CALCIUM 10.2   Liver Function Tests:  Recent Labs Lab 10/08/12 2003  AST 27  ALT 20  ALKPHOS 69  BILITOT 0.4  PROT 8.4*  ALBUMIN 4.4   No results found for this basename: LIPASE, AMYLASE,  in the last 168 hours No results found for this basename: AMMONIA,  in the last 168 hours CBC:  Recent Labs Lab 10/08/12 2003  WBC 6.8  NEUTROABS 2.8  HGB 13.3  HCT 38.1  MCV 92.9  PLT 148*    Cardiac Enzymes: No results found for this basename: CKTOTAL, CKMB, CKMBINDEX, TROPONINI,  in the last 168 hours  BNP (last 3 results) No results found for this basename: PROBNP,  in the last 8760 hours CBG: No results found for this basename: GLUCAP,  in the last 168 hours  Radiological Exams on Admission: Dg Chest 2 View  10/09/2012   *RADIOLOGY REPORT*  Clinical Data: Cough, cold, flank pain.  CHEST - 2 VIEW  Comparison: 03/26/2012.  Findings: Abnormal aeration at the right base, with streaky opacities and elevation of the right diaphragm.  The lower lungs appear more normal in the lateral projection.  No cardiomegaly. Aortic arch atherosclerosis.  No acute osseous findings.  No effusion or pneumothorax.  IMPRESSION: Abnormality of the right lower lung which could represent atelectasis or infectious infiltrate.   Original Report Authenticated By: Tiburcio Pea   Ct Head Wo Contrast  10/09/2012   *RADIOLOGY REPORT*  Clinical Data: Left sided paresthesias and weakness.  Speech difficulty and left facial droop.  CT HEAD WITHOUT CONTRAST  Technique:  Contiguous axial images were obtained from the base of the skull through the vertex without contrast.  Comparison: 12/31/2008.  Findings:  Calvarium:No acute osseous abnormality. No lytic or blastic lesion.  Orbits: No acute abnormality.  Sinuses:  Effusion filling the majority of the imaged left maxillary sinus.  No infiltration of periantral fat.  Brain: No definite abnormality to explain all the reported deficits.  There is a new low attenuation area within the lower right pons, approximately 8 mm.  Unchanged linear low attenuation in the external capsules bilaterally, previous perforator infarctions or toxic/metabolic injury.  No evidence of acute hemorrhage, hydrocephalus, mass lesion/shift.  Intracranial calcific atherosclerosis.  Chronic, mild encephalomalacia in the superior left cerebellum.  These results were called by telephone on 10/09/2012  at 01:10 a.m. to Dr Blinda Leatherwood, who verbally acknowledged these results.  IMPRESSION: 1.  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  2.  Large left maxillary sinus effusion, mostly likely acute sinusitis.   Original Report Authenticated By: Tiburcio Pea    EKG: Independently reviewed. NSR, no acute ST T-wave changes  Assessment/Plan Active Problems:   HYPERTENSION   CVA (cerebral infarction)   Flank pain   1. Right pons age indeterminate lacunar infarct on CT -Presenting 48 hours after onset of symptoms -Admit to telemetry, neuro checks -Out of the window for TPA -Aspirin 325 mg daily or 300mg  PR -Check MRI/MRA brain -2-D echo, carotid duplex -Check lipids, HbA1c -PT OT and ST eval  2. right flank pain/ possible pyelonephritis -Continue ceftriaxone, followup urine cultures  3. chronic back pain: -Hold narcotics for now  4. history of liver cirrhosis per records -Doubt this diagnosis   -LFTs, coags and albumin normal  5. H/o Hypertension  -Blood pressure stable, not on home medications -Monitor for now  Code Status: Full code Family Communication: none at bedside Disposition Plan: inpatient  Time spent:  [facility] Pager [phone]  If 7PM-7AM, please contact night-coverage [email or web address] Password [facility] 10/09/2012, 3:48 AM

## 2012-10-09 NOTE — Progress Notes (Signed)
Utilization review completed.  

## 2012-10-10 DIAGNOSIS — K2100 Gastro-esophageal reflux disease with esophagitis, without bleeding: Secondary | ICD-10-CM

## 2012-10-10 DIAGNOSIS — J45909 Unspecified asthma, uncomplicated: Secondary | ICD-10-CM

## 2012-10-10 DIAGNOSIS — F3289 Other specified depressive episodes: Secondary | ICD-10-CM

## 2012-10-10 DIAGNOSIS — F329 Major depressive disorder, single episode, unspecified: Secondary | ICD-10-CM

## 2012-10-10 LAB — URINE CULTURE: Colony Count: 65000

## 2012-10-10 MED ORDER — ATORVASTATIN CALCIUM 20 MG PO TABS
20.0000 mg | ORAL_TABLET | Freq: Every day | ORAL | Status: DC
  Filled 2012-10-10: qty 1

## 2012-10-10 MED ORDER — ASPIRIN EC 81 MG PO TBEC
81.0000 mg | DELAYED_RELEASE_TABLET | Freq: Every day | ORAL | Status: DC
  Administered 2012-10-10: 81 mg via ORAL
  Filled 2012-10-10: qty 1

## 2012-10-10 MED ORDER — CIPROFLOXACIN HCL 250 MG PO TABS: 250.0000 mg | ORAL_TABLET | Freq: Two times a day (BID) | ORAL | Status: DC

## 2012-10-10 MED ORDER — CLOPIDOGREL BISULFATE 75 MG PO TABS
75.0000 mg | ORAL_TABLET | Freq: Every day | ORAL | Status: DC
  Administered 2012-10-10: 75 mg via ORAL
  Filled 2012-10-10: qty 1

## 2012-10-10 MED ORDER — ATORVASTATIN CALCIUM 20 MG PO TABS: 20.0000 mg | ORAL_TABLET | Freq: Every day | ORAL | Status: DC

## 2012-10-10 MED ORDER — CLOPIDOGREL BISULFATE 75 MG PO TABS: 75.0000 mg | ORAL_TABLET | Freq: Every day | ORAL | Status: DC

## 2012-10-10 MED ORDER — NICOTINE 21 MG/24HR TD PT24: 1.0000 | MEDICATED_PATCH | TRANSDERMAL | Status: DC

## 2012-10-10 MED ORDER — ASPIRIN 81 MG PO TBEC: 81.0000 mg | DELAYED_RELEASE_TABLET | Freq: Every day | ORAL | Status: DC

## 2012-10-10 MED ORDER — CIPROFLOXACIN HCL 500 MG PO TABS: 500.0000 mg | ORAL_TABLET | Freq: Two times a day (BID) | ORAL | Status: DC

## 2012-10-10 NOTE — Progress Notes (Signed)
Speech Language Pathology Dysphagia Treatment Patient Details Name: [name] MRN: [id number] DOB: 24-Sep-1955 Today's Date: 10/10/2012 Time: 1220-1230 SLP Time Calculation (min): 10 min  Assessment / Plan / Recommendation Clinical Impression  Pt. seen for dysphagia treatment following yesterday's MBS.  Pt. recalled chin tuck posture without cues, however she required moderate verbal and visual cues coordinate timing of chin down and swallow.  Pt.able to return demonstrate.  She is leaving today.  Spoke with pt. and her caregiver to attempt regular textures at home with cooking meats tender, using gravy, use caution with dry crumbly foods.  Pt. does not consistently sense penetration therefore discussed repeating MBS in 4-5 weeks to d/c chin tuck if pt. desires and provided written information on scheduling process.    Diet Recommendation  Initiate / Change Diet: Regular;Thin liquid    SLP Plan     Pertinent Vitals/Pain none   Swallowing Goals  SLP Swallowing Goals Patient will utilize recommended strategies during swallow to increase swallowing safety with: Minimal cueing Swallow Study Goal #2 - Progress: Partly met  General Temperature Spikes Noted: No Respiratory Status: Room air Behavior/Cognition: Alert;Cooperative;Pleasant mood Oral Cavity - Dentition: Adequate natural dentition Patient Positioning: Upright in bed  Oral Cavity - Oral Hygiene Does patient have any of the following "at risk" factors?: Nutritional status - inadequate Brush patient's teeth BID with toothbrush (using toothpaste with fluoride): Yes Patient is HIGH RISK - Oral Care Protocol followed (see row info): Yes   Dysphagia Treatment Treatment focused on: Skilled observation of diet tolerance;Patient/family/caregiver education Family/Caregiver Educated: caregiver and pt. Treatment Methods/Modalities: Skilled observation Patient observed directly with PO's: Yes Type of PO's observed: Thin  liquids Feeding: Able to feed self Liquids provided via: Cup;No straw Type of cueing: Verbal;Visual Amount of cueing: Moderate   GO     Breck Coons [place].Ed [employer] [phone]  10/10/2012

## 2012-10-10 NOTE — Progress Notes (Signed)
DC IV, DC Tele, DC Home. Discharge instructions and home medications discussed with patient. Patient denied any questions and concerns at this time. Patient leaving unit via wheelchair and appears in no acute distress.

## 2012-10-10 NOTE — Progress Notes (Signed)
Stroke Team Progress Note  HISTORY [name] is an 57 y.o. female who woke up on Saturday ( 3 days ago) noting left arm and leg weakness, imbalance and a feeling of dizziness. Patient did not seek attention at that time. Due to symptoms not improving and addition al symptoms of flank discomfort and foul smelling urine, she went to urgent care. 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 Neurology was consulted for abnormal CT findings. 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 She was admitted to the floor for further evaluation and treatment.  SUBJECTIVE She is lying in bed. Feels symptoms are stable.  OBJECTIVE Most recent Vital Signs: Filed Vitals:   10/09/12 2000 10/10/12 0000 10/10/12 0345 10/10/12 0806  BP: 139/74 136/76 116/70 128/74  Pulse: 80 76 78 83  Temp: 98.4 F (36.9 C) 98.5 F (36.9 C) 98.4 F (36.9 C) 98.4 F (36.9 C)  TempSrc: Oral Oral Oral Oral  Resp: 16 18 20 18   Height:      Weight:      SpO2: 99% 100% 98% 100%   CBG (last 3)  No results found for this basename: GLUCAP,  in the last 72 hours  IV Fluid Intake:     MEDICATIONS  . aspirin  300 mg Rectal Daily   Or  . aspirin  325 mg Oral Daily  . cefTRIAXone (ROCEPHIN) IVPB 1 gram/50 mL D5W  1 g Intravenous QHS  . enoxaparin (LOVENOX) injection  30 mg Subcutaneous Daily  . pneumococcal 23 valent vaccine  0.5 mL Intramuscular Tomorrow-1000  . simvastatin  20 mg Oral q1800   PRN:  morphine injection, oxyCODONE, senna-docusate  Diet:  Dysphagia 3 liquids Activity:  Bedrest DVT Prophylaxis:  lovenox  CLINICALLY SIGNIFICANT STUDIES Basic Metabolic Panel:  Recent Labs Lab 10/08/12 2003  NA 141  K 3.5  CL 105  CO2 27  GLUCOSE 87  BUN 10  CREATININE 0.76  CALCIUM 10.2   Liver Function Tests:  Recent Labs Lab 10/08/12 2003  AST 27  ALT  20  ALKPHOS 69  BILITOT 0.4  PROT 8.4*  ALBUMIN 4.4   CBC:  Recent Labs Lab 10/08/12 2003  WBC 6.8  NEUTROABS 2.8  HGB 13.3  HCT 38.1  MCV 92.9  PLT 148*   Coagulation:  Recent Labs Lab 10/09/12 0023  LABPROT 13.9  INR 1.09   Cardiac Enzymes: No results found for this basename: CKTOTAL, CKMB, CKMBINDEX, TROPONINI,  in the last 168 hours Urinalysis:  Recent Labs Lab 10/08/12 1844 10/09/12 0141  COLORURINE  --  YELLOW  LABSPEC 1.025 1.023  PHURINE 6.0 6.0  GLUCOSEU NEGATIVE NEGATIVE  HGBUR NEGATIVE NEGATIVE  BILIRUBINUR NEGATIVE NEGATIVE  KETONESUR NEGATIVE NEGATIVE  PROTEINUR NEGATIVE NEGATIVE  UROBILINOGEN 1.0 1.0  NITRITE NEGATIVE NEGATIVE  LEUKOCYTESUR SMALL* LARGE*   Lipid Panel    Component Value Date/Time   CHOL 247* 10/09/2012 0450   TRIG 126 10/09/2012 0450   HDL 41 10/09/2012 0450   CHOLHDL 6.0 10/09/2012 0450   VLDL 25 10/09/2012 0450   LDLCALC 181* 10/09/2012 0450   HgbA1C  Lab Results  Component Value Date   HGBA1C 5.2 10/09/2012    Urine Drug Screen:     Component Value Date/Time   LABOPIA POSITIVE* 06/29/2010 1153   COCAINSCRNUR NONE DETECTED 06/29/2010 1153   LABBENZ NONE DETECTED 06/29/2010 1153  AMPHETMU NONE DETECTED 06/29/2010 1153   THCU NONE DETECTED 06/29/2010 1153   LABBARB  Value: NONE DETECTED        DRUG SCREEN FOR MEDICAL PURPOSES ONLY.  IF CONFIRMATION IS NEEDED FOR ANY PURPOSE, NOTIFY LAB WITHIN 5 DAYS.        LOWEST DETECTABLE LIMITS FOR URINE DRUG SCREEN Drug Class       Cutoff (ng/mL) Amphetamine      1000 Barbiturate      200 Benzodiazepine   200 Tricyclics       300 Opiates          300 Cocaine          300 THC              50 06/29/2010 1153    Alcohol Level: No results found for this basename: ETH,  in the last 168 hours  Dg Chest 2 View  10/09/2012   *RADIOLOGY REPORT*  Clinical Data: Cough, cold, flank pain.  CHEST - 2 VIEW  Comparison: 03/26/2012.  Findings: Abnormal aeration at the right base, with streaky opacities and  elevation of the right diaphragm.  The lower lungs appear more normal in the lateral projection.  No cardiomegaly. Aortic arch atherosclerosis.  No acute osseous findings.  No effusion or pneumothorax.  IMPRESSION: Abnormality of the right lower lung which could represent atelectasis or infectious infiltrate.   Original Report Authenticated By: Tiburcio Pea   Ct Head Wo Contrast  10/09/2012   *RADIOLOGY REPORT*  Clinical Data: Left sided paresthesias and weakness.  Speech difficulty and left facial droop.  CT HEAD WITHOUT CONTRAST  Technique:  Contiguous axial images were obtained from the base of the skull through the vertex without contrast.  Comparison: 12/31/2008.  Findings:  Calvarium:No acute osseous abnormality. No lytic or blastic lesion.  Orbits: No acute abnormality.  Sinuses:  Effusion filling the majority of the imaged left maxillary sinus.  No infiltration of periantral fat.  Brain: No definite abnormality to explain all the reported deficits.  There is a new low attenuation area within the lower right pons, approximately 8 mm.  Unchanged linear low attenuation in the external capsules bilaterally, previous perforator infarctions or toxic/metabolic injury.  No evidence of acute hemorrhage, hydrocephalus, mass lesion/shift.  Intracranial calcific atherosclerosis.  Chronic, mild encephalomalacia in the superior left cerebellum.  These results were called by telephone on 10/09/2012 at 01:10 a.m. to Dr Blinda Leatherwood, who verbally acknowledged these results.  IMPRESSION: 1.  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  2.  Large left maxillary sinus effusion, mostly likely acute sinusitis.   Original Report Authenticated By: Tiburcio Pea   Mr Brain Wo Contrast  10/09/2012   *RADIOLOGY REPORT*  Clinical Data:  Stroke.  Left-sided weakness  MRI HEAD WITHOUT CONTRAST MRA HEAD WITHOUT CONTRAST  Technique:  Multiplanar, multiecho pulse sequences of the brain and  surrounding structures were obtained without intravenous contrast. Angiographic images of the head were obtained using MRA technique without contrast.  Comparison:  CT 10/09/2012  MRI HEAD  Findings:  Moderately large acute infarct right pons involving the anterior and mid pons.  No other acute infarct.  Chronic infarcts in the external capsule and thalami bilaterally. Mild chronic microvascular ischemic change in the cerebral white matter.  Negative for hemorrhage or mass.  Ventricle size is normal.  Mucosal thickening and   air-fluid level with frothy secretions in the left maxillary sinus.  IMPRESSION: Acute infarct in the  right paramedian pons.  Chronic ischemic change in the external capsule and thalami bilaterally.  Sinusitis with air-fluid level and secretions in the left maxillary sinus.  MRA HEAD  Findings: The   left vertebral artery is widely patent to the basilar.  Left PICA is patent.  Right vertebral artery is nondominant and ends in pica.  There is a moderate to severe stenosis in the mid basilar.  The distal basilar is patent.  Superior cerebellar and posterior cerebral arteries are patent.  Mild atherosclerotic disease in the posterior cerebral arteries bilaterally.  Internal carotid artery is widely patent bilaterally without stenosis.  Anterior cerebral arteries are patent bilaterally. There is mild stenosis in the middle cerebral artery bifurcation bilaterally.  No large vessel occlusion or aneurysm.  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  Mild disease in the middle cerebral artery bifurcation bilaterally.   Original Report Authenticated By: Janeece Riggers, M.D.   Dg Swallowing Func-speech Pathology  10/09/2012   Breck Coons [place], CCC-SLP     10/09/2012  2:27 PM Objective Swallowing Evaluation: Modified Barium Swallowing Study   Patient Details  Name: [name] MRN: [id number] Date of Birth: 1955-03-17  Today's Date: 10/09/2012 Time: 4132-4401 SLP Time Calculation (min):  25 min  Past Medical History:  Past Medical History  Diagnosis Date  . Chronic back pain   . MDD (major depressive disorder)   . Alcohol abuse   . PTSD (post-traumatic stress disorder)   . UTI (lower urinary tract infection)   . Colitis    Past Surgical History: History reviewed. No pertinent past  surgical history. HPI:  57 y.o. female with PMH of chronic back pain, history of liver  cirrhosis, hypertension, remote CVA presents to the ER with left  leg and arm weakness along with slurring of her speech, dragging  her left leg while walking, and in addition also noticed some  left flank pain, along with foul-smelling odor and discoloration  of urine. CT head in the ER notable for this and it indeterminant  infarction in the right pons.  CXR revealed abnormality of the  right lower lung which could represent atelectasis or infectious  infiltrate.  BSE recommended an MBS.     Assessment / Plan / Recommendation Clinical Impression  Dysphagia Diagnosis: Mild pharyngeal phase dysphagia Clinical impression: Pt. exhibited mild pharyngeal phase  dysphagia primarily due to reduced sensory tract leading to  intermittent penetration with thin liquids (both flash and  remained in vestibule).  Initially, pt. continued to penetrate  despite chin tuck posture, however chin tuck was re-implemented  at end of study which prevented penetration possibly due to  increased muscle strength as the study progressed.  SLP  recommends Dys 3 (energy conservation) and thin liquids with chin  tuck.  SLP will continue to follow.         Treatment Recommendation  Therapy as outlined in treatment plan below    Diet Recommendation Dysphagia 3 (Mechanical Soft);Thin liquid   Liquid Administration via: Cup;No straw Medication Administration: Whole meds with puree Supervision: Patient able to self feed;Intermittent supervision  to cue for compensatory strategies Compensations: Slow rate;Small sips/bites;Clear throat  intermittently Postural Changes  and/or Swallow Maneuvers: Chin tuck;Seated  upright 90 degrees    Other  Recommendations Oral Care Recommendations: Oral care BID   Follow Up Recommendations   (TBD)    Frequency and Duration min 2x/week  2 weeks   Pertinent Vitals/Pain     SLP Swallow Goals Patient will utilize  recommended strategies during swallow to  increase swallowing safety with: Minimal cueing   General HPI: 57 y.o. female with PMH of chronic back pain,  history of liver cirrhosis, hypertension, remote CVA presents to  the ER with left leg and arm weakness along with slurring of her  speech, dragging her left leg while walking, and in addition also  noticed some left flank pain, along with foul-smelling odor and  discoloration of urine. CT head in the ER notable for this and it  indeterminant infarction in the right pons.  CXR revealed  abnormality of the right lower lung which could represent  atelectasis or infectious infiltrate.  BSE recommended an MBS. Type of Study: Modified Barium Swallowing Study Reason for Referral: Objectively evaluate swallowing function Diet Prior to this Study: NPO Temperature Spikes Noted: No Respiratory Status: Room air History of Recent Intubation: No Behavior/Cognition: Alert;Cooperative;Pleasant mood Oral Cavity - Dentition: Adequate natural dentition Oral Motor / Sensory Function: Impaired - see Bedside swallow  eval Patient Positioning: Upright in chair Anatomy: Within functional limits Pharyngeal Secretions: Not observed secondary MBS    Reason for Referral Objectively evaluate swallowing function   Oral Phase Oral Preparation/Oral Phase Oral Phase: WFL   Pharyngeal Phase Pharyngeal Phase Pharyngeal Phase: Impaired Pharyngeal - Nectar Pharyngeal - Nectar Teaspoon: Delayed swallow  initiation;Premature spillage to pyriform sinuses Pharyngeal - Nectar Cup: Delayed swallow initiation;Premature  spillage to pyriform sinuses Pharyngeal - Thin Pharyngeal - Thin Teaspoon: Penetration/Aspiration during   swallow;Reduced airway/laryngeal closure;Reduced laryngeal  elevation Penetration/Aspiration details (thin teaspoon): Material enters  airway, remains ABOVE vocal cords then ejected out Pharyngeal - Thin Cup: Penetration/Aspiration during  swallow;Reduced laryngeal elevation;Reduced tongue base  retraction Penetration/Aspiration details (thin cup): Material enters  airway, remains ABOVE vocal cords and not ejected out;Material  enters airway, remains ABOVE vocal cords then ejected out Pharyngeal - Solids Pharyngeal - Regular: Within functional limits  Cervical Esophageal Phase    GO    Cervical Esophageal Phase Cervical Esophageal Phase: Impaired Cervical Esophageal Phase - Comment Cervical Esophageal Comment:  (trace residue in below UES)         Royce Macadamia M.Ed CCC-SLP Pager [phone]  10/09/2012   Mr Maxine Glenn Head/brain Wo Cm  10/09/2012   *RADIOLOGY REPORT*  Clinical Data:  Stroke.  Left-sided weakness  MRI HEAD WITHOUT CONTRAST MRA HEAD WITHOUT CONTRAST  Technique:  Multiplanar, multiecho pulse sequences of the brain and surrounding structures were obtained without intravenous contrast. Angiographic images of the head were obtained using MRA technique without contrast.  Comparison:  CT 10/09/2012  MRI HEAD  Findings:  Moderately large acute infarct right pons involving the anterior and mid pons.  No other acute infarct.  Chronic infarcts in the external capsule and thalami bilaterally. Mild chronic microvascular ischemic change in the cerebral white matter.  Negative for hemorrhage or mass.  Ventricle size is normal.  Mucosal thickening and   air-fluid level with frothy secretions in the left maxillary sinus.  IMPRESSION: Acute infarct in the right paramedian pons.  Chronic ischemic change in the external capsule and thalami bilaterally.  Sinusitis with air-fluid level and secretions in the left maxillary sinus.  MRA HEAD  Findings: The   left vertebral artery is widely patent to the basilar.  Left PICA is  patent.  Right vertebral artery is nondominant and ends in pica.  There is a moderate to severe stenosis in the mid basilar.  The distal basilar is patent.  Superior cerebellar and posterior cerebral arteries are patent.  Mild atherosclerotic disease in the posterior cerebral arteries bilaterally.  Internal carotid artery is widely patent bilaterally without stenosis.  Anterior cerebral arteries are patent bilaterally. There is mild stenosis in the middle cerebral artery bifurcation bilaterally.  No large vessel occlusion or aneurysm.  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  Mild disease in the middle cerebral artery bifurcation bilaterally.   Original Report Authenticated By: Janeece Riggers, M.D.    2D Echocardiogram  EF 55%, wall motion normal, no asd identified.  Carotid Doppler  less than 39 percent stenosis involving the right internal carotid artery and the left internal carotid artery.   EKG  normal sinus rhythm.   Therapy Recommendations Outpatient PT/OT  Physical Exam   Pleasant middle aged [place] American lady not in distress.Awake alert. Afebrile. Head is nontraumatic. Neck is supple without bruit. Hearing is normal. Cardiac exam no murmur or gallop. Lungs are clear to auscultation. Distal pulses are well felt. Neurological Exam : Awake alert oriented x 3 normal speech and language. Mild left lower face asymmetry. Tongue midline. No drift. Mild diminished fine finger movements on left. Orbits right over left upper extremity. Mild left grip weak.. Normal sensation . Normal coordination. ASSESSMENT [name] is a 57 y.o. female presenting with left hemiparesis, left facial droop. Imaging confirms a right infarct in the paramedian pons. Infarct felt to be thrombotic.  On no antithrombotics prior to admission. Now on aspirin 325 mg orally every day for secondary stroke prevention. Patient with resultant left hemiparesis and left facial droop. Work up  underway.   Hyperlipidemia, LDL 181, goal < 100, start statin  Tobacco abuse, smoking cessation counseling ( nicotine patch)  Previous alcohol abuse, cessation 2 years ago per patient  Diffuse arthritis, outpatient pain management  Hospital day # 2  TREATMENT/PLAN  Change to Plavix 75mg  daily and baby aspirin 81mg  daily for secondary stroke prevention.  ambulate  Smoking cessation  Continue to remain hydrated  Risk factor management  Lipitor started.  Follow up with Dr. Pearlean Brownie in 2 months   Gwendolyn Lima. Manson Passey, [facility], MBA, MHA [facility] Pager: [phone] 10/10/2012 9:42 AM  I have personally obtained a history, examined the patient, evaluated imaging results, and formulated the assessment and plan of care. I agree with the above. Delia Heady, MD

## 2012-10-10 NOTE — Progress Notes (Signed)
Nutrition Brief Note  Patient identified on the Malnutrition Screening Tool (MST) Report. Pt reports that her appetite is great. She states that her usual weight is around 130 lb, this is consistent with her current weight.  Body mass index is 23.48 kg/(m^2). Patient meets criteria for normal weight based on current BMI.   Current diet order is Dysphagia 3, patient is consuming approximately 75% of meals at this time. Labs and medications reviewed.   No nutrition interventions warranted at this time. If nutrition issues arise, please consult RD.   Jarold Motto MS, RD, LDN Pager: [phone] After-hours pager: [phone]

## 2012-10-10 NOTE — Evaluation (Signed)
Occupational Therapy Evaluation Patient Details Name: [name] MRN: [id number] DOB: 06-Oct-1955 Today's Date: 10/10/2012 Time: 4259-5638 OT Time Calculation (min): 37 min  OT Assessment / Plan / Recommendation History of present illness Pt admitted with left side weakness. Stroke workup underway.    Clinical Impression   Pt adm with above. Will continue to benefit from acute OT services in order to address below problem list in prep for return home with 24/7 assist.    OT Assessment  Patient needs continued OT Services    Follow Up Recommendations  Outpatient OT;Supervision/Assistance - 24 hour    Barriers to Discharge      Equipment Recommendations  3 in 1 bedside comode    Recommendations for Other Services    Frequency  Min 3X/week    Precautions / Restrictions Precautions Precautions: Fall Restrictions Weight Bearing Restrictions: No   Pertinent Vitals/Pain See vitals    ADL  Eating/Feeding: Performed;Modified independent Where Assessed - Eating/Feeding: Edge of bed Grooming: Performed;Denture care;Teeth care;Supervision/safety Where Assessed - Grooming: Unsupported standing Upper Body Bathing: Simulated;Set up Where Assessed - Upper Body Bathing: Unsupported sitting Lower Body Bathing: Simulated;Min guard Where Assessed - Lower Body Bathing: Unsupported sit to stand Upper Body Dressing: Performed;Set up Where Assessed - Upper Body Dressing: Unsupported sitting Lower Body Dressing: Performed;Min guard Where Assessed - Lower Body Dressing: Unsupported sit to stand Toilet Transfer: Simulated;Min guard Toilet Transfer Method:  (ambulating) Acupuncturist:  (bed) Equipment Used: Gait belt;Rolling walker Transfers/Ambulation Related to ADLs: close supervision-min guard with RW. No  LOB.   ADL Comments: Pt bumped into doorframe on left side when walking out of room.  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  Pt making sudden turns in  room and reaching for ADL items with no LOB and close supervision/min guard for safety only.    OT Diagnosis: Generalized weakness;Paresis  OT Problem List: Decreased strength;Decreased activity tolerance;Impaired balance (sitting and/or standing);Impaired UE functional use;Decreased knowledge of use of DME or AE OT Treatment Interventions: Self-care/ADL training;DME and/or AE instruction;Therapeutic activities;Patient/family education;Balance training   OT Goals(Current goals can be found in the care plan section) Acute Rehab OT Goals Patient Stated Goal: to return home OT Goal Formulation: With patient Time For Goal Achievement: 10/17/12 Potential to Achieve Goals: Good ADL Goals Pt/caregiver will Perform Home Exercise Program: For increased strengthening;For improved balance;With Supervision, verbal cues required/provided  Visit Information  Last OT Received On: 10/10/12 Assistance Needed: +1 PT/OT Co-Evaluation/Treatment: Yes History of Present Illness: Pt presents with left sided weakness and slurred speech. She has a h/o previous right CVA with left eye blindness and mild left weakness but weakness has increased, especially RUE and speech is more slurred per pt report       Prior Functioning     Home Living Family/patient expects to be discharged to:: Private residence Living Arrangements: Other relatives;Non-relatives/Friends Available Help at Discharge: Family;Personal care attendant;Available 24 hours/day Type of Home: House Home Access: Stairs to enter [employer] of Steps: 1 Home Layout: Two level;Bed/bath upstairs Alternate Level Stairs-Number of Steps: 13 Alternate Level Stairs-Rails: Right Home Equipment: Cane - single point;Walker - 2 wheels Additional Comments: pt does not use RW except for longer distances, within house, often does not need AD Prior Function Level of Independence: Needs assistance Gait / Transfers Assistance Needed: Uses RW when out  in community. ADL's / Homemaking Assistance Needed: Assist with tub transfers and ADLs  Communication Communication: Expressive difficulties (slurred) Dominant Hand: Left  Vision/Perception Vision - History Baseline Vision: Legally blind (left eye) Visual History: Cataracts (in right eye) Patient Visual Report: No change from baseline   Cognition  Cognition Arousal/Alertness: Awake/alert Behavior During Therapy: WFL for tasks assessed/performed Overall Cognitive Status: Within Functional Limits for tasks assessed    Extremity/Trunk Assessment Upper Extremity Assessment Upper Extremity Assessment: LUE deficits/detail LUE Deficits / Details: 3+/5 grip, elbow and shoulder flexion. 3/5 shoulder ext.   LUE Sensation: decreased light touch LUE Coordination: decreased fine motor;decreased gross motor Lower Extremity Assessment Lower Extremity Assessment: LLE deficits/detail LLE Deficits / Details: hip flex 3+/5, knee ext 3+/5, knee flex 3/5, pt with decreased knee control in standing LLE Coordination: decreased gross motor;decreased fine motor Cervical / Trunk Assessment Cervical / Trunk Assessment: Normal     Mobility Bed Mobility Bed Mobility: Supine to Sit Supine to Sit: 7: Independent Transfers Transfers: Sit to Stand;Stand to Sit Sit to Stand: 4: Min guard;From bed Stand to Sit: 4: Min guard;To bed;To chair/3-in-1 Details for Transfer Assistance: pt stands safely with  no LOB     Exercise Other Exercises Other Exercises: discussed balance exercises at home including SL stance at sink   Balance Balance Balance Assessed: Yes Static Sitting Balance Static Sitting - Balance Support: Feet supported;No upper extremity supported Static Sitting - Level of Assistance: 7: Independent Dynamic Sitting Balance Dynamic Sitting - Balance Support: Feet supported;No upper extremity supported Dynamic Sitting - Level of Assistance: 7: Independent Dynamic Standing  Balance Dynamic Standing - Balance Support: No upper extremity supported;During functional activity Dynamic Standing - Level of Assistance: 4: Min assist Dynamic Standing - Comments: unable to achieve SL stance without min A. Able to reach 6" out of BOS in multiple planes with no LOB   End of Session OT - End of Session Equipment Utilized During Treatment: Rolling walker Activity Tolerance: Patient tolerated treatment well Patient left: in chair;with call bell/phone within reach Nurse Communication: Mobility status  GO   10/10/2012 Cipriano Mile OTR/L Pager [phone] Office [phone]   Cipriano Mile 10/10/2012, 9:10 AM

## 2012-10-10 NOTE — Progress Notes (Signed)
Physical Therapy Evaluation Patient Details Name: [name] MRN: [id number] DOB: Dec 27, 1955 Today's Date: 10/10/2012 Time: 5638-7564 PT Time Calculation (min): 30 min  PT Assessment / Plan / Recommendation History of Present Illness  Pt presents with left sided weakness and slurred speech. She has a h/o previous right CVA with left eye blindness and mild left weakness but weakness has increased, especially RUE and speech is more slurred per pt report  Clinical Impression  Pt will benefit from acute PT to improve balance and strength of the left side which should increase independence and safety for d/c home. Highly recommend outpt PT at d/c for high level balance training and strengthening.    PT Assessment  Patient needs continued PT services    Follow Up Recommendations  Outpatient PT    Does the patient have the potential to tolerate intense rehabilitation      Barriers to Discharge        Equipment Recommendations  3in1 (PT)    Recommendations for Other Services     Frequency Min 4X/week    Precautions / Restrictions Precautions Precautions: Fall Restrictions Weight Bearing Restrictions: No   Pertinent Vitals/Pain VSS      Mobility  Bed Mobility Bed Mobility: Supine to Sit Supine to Sit: 7: Independent Transfers Transfers: Sit to Stand;Stand to Sit Sit to Stand: 4: Min guard;From bed Stand to Sit: 4: Min guard;To bed;To chair/3-in-1 Details for Transfer Assistance: pt stands safely with  no LOB Ambulation/Gait Ambulation/Gait Assistance: 4: Min guard;4: Min assist Ambulation Distance (Feet): 200 Feet Assistive device: Rolling walker;None Ambulation/Gait Assistance Details: began ambulation without AD and min A but pt's gait pattern very uneven and lack of knee control on left. With RW, pt could ambulate with min-guard A and more symmetrical pattern, knee control improved with distance but still had difficulty with descending stairs Gait Pattern:  Step-through pattern;Decreased stance time - left;Left foot flat;Left flexed knee in stance Gait velocity: decreased Stairs: Yes Stairs Assistance: 4: Min guard;4: Min assist Stairs Assistance Details (indicate cue type and reason): min-guard A with ascending, min A with descending. Pt able to perform alternating pattern on the way up but had to step down with left foot each time due to lack of knee control and kept wt posterior.  Heavy reliance on rail on the way down Stair Management Technique: One rail Right;Alternating pattern;Step to pattern;Forwards Number of Stairs: 12 Wheelchair Mobility Wheelchair Mobility: No Modified Rankin (Stroke Patients Only) Pre-Morbid Rankin Score: Moderate disability Modified Rankin: Moderately severe disability    Exercises Other Exercises Other Exercises: discussed balance exercises at home including SL stance at sink   PT Diagnosis: Abnormality of gait  PT Problem List: Decreased strength;Decreased balance;Decreased mobility;Decreased coordination PT Treatment Interventions: DME instruction;Gait training;Stair training;Functional mobility training;Therapeutic activities;Therapeutic exercise;Balance training;Patient/family education     PT Goals(Current goals can be found in the care plan section) Acute Rehab PT Goals Patient Stated Goal: to return home PT Goal Formulation: With patient Time For Goal Achievement: 10/24/12 Potential to Achieve Goals: Good  Visit Information  Last PT Received On: 10/10/12 Assistance Needed: +1 PT/OT Co-Evaluation/Treatment: Yes History of Present Illness: Pt presents with left sided weakness and slurred speech. She has a h/o previous right CVA with left eye blindness and mild left weakness but weakness has increased, especially RUE and speech is more slurred per pt report       Prior Functioning  Home Living Family/patient expects to be discharged to:: Private residence Living Arrangements: Other  relatives;Non-relatives/Friends Available  Help at Discharge: Family;Personal care attendant;Available 24 hours/day Type of Home: House Home Access: Stairs to enter [employer] of Steps: 1 Home Layout: Two level;Bed/bath upstairs Alternate Level Stairs-Number of Steps: 13 Alternate Level Stairs-Rails: Right Home Equipment: Cane - single point;Walker - 2 wheels Additional Comments: pt does not use RW except for longer distances, within house, often does not need AD Prior Function Level of Independence: Needs assistance Gait / Transfers Assistance Needed: Uses RW when out in community. ADL's / Homemaking Assistance Needed: Assist with tub transfers and ADLs  Communication Communication: Expressive difficulties (slurred) Dominant Hand: Left    Cognition  Cognition Arousal/Alertness: Awake/alert Behavior During Therapy: WFL for tasks assessed/performed Overall Cognitive Status: Within Functional Limits for tasks assessed    Extremity/Trunk Assessment Upper Extremity Assessment Upper Extremity Assessment: LUE deficits/detail LUE Deficits / Details: 3+/5 grip, elbow and shoulder flexion. 3/5 shoulder ext.   LUE Sensation: decreased light touch LUE Coordination: decreased fine motor;decreased gross motor Lower Extremity Assessment Lower Extremity Assessment: LLE deficits/detail LLE Deficits / Details: hip flex 3+/5, knee ext 3+/5, knee flex 3/5, pt with decreased knee control in standing LLE Coordination: decreased gross motor;decreased fine motor Cervical / Trunk Assessment Cervical / Trunk Assessment: Normal   Balance Balance Balance Assessed: Yes Static Sitting Balance Static Sitting - Balance Support: Feet supported;No upper extremity supported Static Sitting - Level of Assistance: 7: Independent Dynamic Sitting Balance Dynamic Sitting - Balance Support: Feet supported;No upper extremity supported Dynamic Sitting - Level of Assistance: 7: Independent Dynamic Standing  Balance Dynamic Standing - Balance Support: No upper extremity supported;During functional activity Dynamic Standing - Level of Assistance: 4: Min assist Dynamic Standing - Comments: unable to achieve SL stance without min A. Able to reach 6" out of BOS in multiple planes with no LOB  End of Session PT - End of Session Equipment Utilized During Treatment: Gait belt Activity Tolerance: Patient tolerated treatment well Patient left: in chair;with call bell/phone within reach Nurse Communication: Mobility status  GP   Lyanne Co, PT  Acute Rehab Services  [phone]   Lyanne Co 10/10/2012, 9:10 AM

## 2012-10-10 NOTE — Discharge Summary (Signed)
Physician Discharge Summary  Patient ID: [name] MRN: [id number] DOB/AGE: 1955-12-11 57 y.o.  Admit date: 10/08/2012 Discharge date: 10/10/2012  Primary Care Physician:  Lonia Blood, MD  Discharge Diagnoses:    . 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 Flank pain/ UTI, possible pyelonephritis  . HYPERTENSION . Right pontine stroke  Nicotine abuse  Consults: Neurology, stroke service   Recommendations for Outpatient Follow-up:  1) patient needs to continue aspirin and Plavix for 3 months. After 3 months continue Plavix indefinitely 2) She was advised to quit smoking and keep herself hydrated   Allergies:   Allergies  Allergen Reactions  . Ibuprofen     Unknown  . Penicillins     Unknown     Discharge Medications:   Medication List         aspirin 81 MG EC tablet  Take 1 tablet (81 mg total) by mouth daily.     atorvastatin 20 MG tablet  Commonly known as:  LIPITOR  Take 1 tablet (20 mg total) by mouth daily.     ciprofloxacin 500 MG tablet  Commonly known as:  CIPRO  Take 1 tablet (500 mg total) by mouth 2 (two) times daily. X 7 days     clopidogrel 75 MG tablet  Commonly known as:  PLAVIX  Take 1 tablet (75 mg total) by mouth daily with breakfast.     cyclobenzaprine 5 MG tablet  Commonly known as:  FLEXERIL  Take 1 tablet (5 mg total) by mouth 3 (three) times daily as needed for muscle spasms.     nicotine 21 mg/24hr patch  Commonly known as:  NICODERM CQ  Place 1 patch onto the skin daily.     oxyCODONE-acetaminophen 5-325 MG per tablet  Commonly known as:  PERCOCET/ROXICET  Take 1 tablet by mouth every 4 (four) hours as needed. For pain         Brief H and P: For complete details please refer to admission H and P, but in brief [name] is a 57 y.o. female with PMH of chronic back pain, history of liver cirrhosis, hypertension, remote CVA presents to the ER with multiple complaints.  On  Saturday, 3 days before the admission, she noticed left leg and arm weakness along with slurring of her speech, didn't think much of it and thought she would get better.  Through the weekend kept dragging her left leg while walking, and in addition also noticed some left flank pain, along with foul-smelling odor and discoloration of urine.  She went to the urgent care and was referred to the ER for further evaluation and management.  CT head in the ER notable for this and it indeterminant infarction in the right pons and TRH was consulted for further evaluation and management   Hospital Course:  Patient is a 57 year old female with history of chronic back pain, liver cirrhosis, hypertension, remote CVA presented with left arm pain leg weakness, or dysphagia. Patient was admitted for stroke workup. 1) Acute CVA : CT of the head showed age indeterminate right pontine infarct per neurology/stroke service was consulted. MRI confirmed stroke and MRA of the head showed moderate to severe stenosis of the mid basilar artery. The patient was not considered daily candidate secondary to delayed presentation. 2-D echo showed EF of 55%, normal wall motion, no ASD identified. Carotid Doppler showed less than 39% stenosis involving the right internal carotid artery in the left internal carotid artery. PT recommended  Outpatient PT.  Per stroke service: patient needs to continue aspirin and Plavix for 3 months, Plavix indefinitely. Lipid panel showed cholesterol 247, LDL of 181, she was started on Lipitor.   Patient was recommended to stop smoking and nicotine patches were provided at discharge. She was recommended to keep herself hydrated and follow up with stroke service, Dr. Pearlean Brownie in 2 months.  2) UTI, flank pain: Patient was initially Rocephin, urine culture showed 65,000 colonies, given her symptoms have improved, she will be dc'ed on ciprofloxacin for a week.      Day of Discharge BP 115/93  Pulse 71   Temp(Src) 98.4 F (36.9 C) (Oral)  Resp 18  Ht 5\' 3"  (1.6 m)  Wt 60.102 kg (132 lb 8 oz)  BMI 23.48 kg/m2  SpO2 100%  Physical Exam: General: Alert and awake oriented x3 not in any acute distress. CVS: S1-S2 clear no murmur rubs or gallops Chest: clear to auscultation bilaterally, no wheezing rales or rhonchi Abdomen: soft nontender, nondistended, normal bowel sounds Extremities: no cyanosis, clubbing or edema noted bilaterally Neuro: strength 4/5 in left upper and lower extremity    The results of significant diagnostics from this hospitalization (including imaging, microbiology, ancillary and laboratory) are listed below for reference.    LAB RESULTS: Basic Metabolic Panel:  Recent Labs Lab 10/08/12 2003  NA 141  K 3.5  CL 105  CO2 27  GLUCOSE 87  BUN 10  CREATININE 0.76  CALCIUM 10.2   Liver Function Tests:  Recent Labs Lab 10/08/12 2003  AST 27  ALT 20  ALKPHOS 69  BILITOT 0.4  PROT 8.4*  ALBUMIN 4.4   CBC:  Recent Labs Lab 10/08/12 2003  WBC 6.8  NEUTROABS 2.8  HGB 13.3  HCT 38.1  MCV 92.9  PLT 148*    Significant Diagnostic Studies:  Dg Chest 2 View  10/09/2012   *RADIOLOGY REPORT*  Clinical Data: Cough, cold, flank pain.  CHEST - 2 VIEW  Comparison: 03/26/2012.  Findings: Abnormal aeration at the right base, with streaky opacities and elevation of the right diaphragm.  The lower lungs appear more normal in the lateral projection.  No cardiomegaly. Aortic arch atherosclerosis.  No acute osseous findings.  No effusion or pneumothorax.  IMPRESSION: Abnormality of the right lower lung which could represent atelectasis or infectious infiltrate.   Original Report Authenticated By: Tiburcio Pea   Ct Head Wo Contrast  10/09/2012   *RADIOLOGY REPORT*  Clinical Data: Left sided paresthesias and weakness.  Speech difficulty and left facial droop.  CT HEAD WITHOUT CONTRAST  Technique:  Contiguous axial images were obtained from the base of the skull  through the vertex without contrast.  Comparison: 12/31/2008.  Findings:  Calvarium:No acute osseous abnormality. No lytic or blastic lesion.  Orbits: No acute abnormality.  Sinuses:  Effusion filling the majority of the imaged left maxillary sinus.  No infiltration of periantral fat.  Brain: No definite abnormality to explain all the reported deficits.  There is a new low attenuation area within the lower right pons, approximately 8 mm.  Unchanged linear low attenuation in the external capsules bilaterally, previous perforator infarctions or toxic/metabolic injury.  No evidence of acute hemorrhage, hydrocephalus, mass lesion/shift.  Intracranial calcific atherosclerosis.  Chronic, mild encephalomalacia in the superior left cerebellum.  These results were called by telephone on 10/09/2012 at 01:10 a.m. to Dr Blinda Leatherwood, who verbally acknowledged these results.  IMPRESSION: 1.  Lacunar type infarction in the right pons is age indeterminate, new from  2010 comparison.  2.  Large left maxillary sinus effusion, mostly likely acute sinusitis.   Original Report Authenticated By: Tiburcio Pea   Mr Brain Wo Contrast  10/09/2012   *RADIOLOGY REPORT*  Clinical Data:  Stroke.  Left-sided weakness  MRI HEAD WITHOUT CONTRAST MRA HEAD WITHOUT CONTRAST  Technique:  Multiplanar, multiecho pulse sequences of the brain and surrounding structures were obtained without intravenous contrast. Angiographic images of the head were obtained using MRA technique without contrast.  Comparison:  CT 10/09/2012  MRI HEAD  Findings:  Moderately large acute infarct right pons involving the anterior and mid pons.  No other acute infarct.  Chronic infarcts in the external capsule and thalami bilaterally. Mild chronic microvascular ischemic change in the cerebral white matter.  Negative for hemorrhage or mass.  Ventricle size is normal.  Mucosal thickening and   air-fluid level with frothy secretions in the left maxillary sinus.  IMPRESSION: Acute  infarct in the right paramedian pons.  Chronic ischemic change in the external capsule and thalami bilaterally.  Sinusitis with air-fluid level and secretions in the left maxillary sinus.  MRA HEAD  Findings: The   left vertebral artery is widely patent to the basilar.  Left PICA is patent.  Right vertebral artery is nondominant and ends in pica.  There is a moderate to severe stenosis in the mid basilar.  The distal basilar is patent.  Superior cerebellar and posterior cerebral arteries are patent.  Mild atherosclerotic disease in the posterior cerebral arteries bilaterally.  Internal carotid artery is widely patent bilaterally without stenosis.  Anterior cerebral arteries are patent bilaterally. There is mild stenosis in the middle cerebral artery bifurcation bilaterally.  No large vessel occlusion or aneurysm.  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  Mild disease in the middle cerebral artery bifurcation bilaterally.   Original Report Authenticated By: Janeece Riggers, M.D.   Dg Swallowing Func-speech Pathology  10/09/2012   Breck Coons [place], CCC-SLP     10/09/2012  2:27 PM Objective Swallowing Evaluation: Modified Barium Swallowing Study   Patient Details  Name: Nandika Stetzer MRN: [id number] Date of Birth: [birth year]  Today's Date: 10/09/2012 Time: 8295-6213 SLP Time Calculation (min): 25 min  Past Medical History:  Past Medical History  Diagnosis Date  . Chronic back pain   . MDD (major depressive disorder)   . Alcohol abuse   . PTSD (post-traumatic stress disorder)   . UTI (lower urinary tract infection)   . Colitis    Past Surgical History: History reviewed. No pertinent past  surgical history. HPI:  57 y.o. female with PMH of chronic back pain, history of liver  cirrhosis, hypertension, remote CVA presents to the ER with left  leg and arm weakness along with slurring of her speech, dragging  her left leg while walking, and in addition also noticed some  left flank pain, along with  foul-smelling odor and discoloration  of urine. CT head in the ER notable for this and it indeterminant  infarction in the right pons.  CXR revealed abnormality of the  right lower lung which could represent atelectasis or infectious  infiltrate.  BSE recommended an MBS.     Assessment / Plan / Recommendation Clinical Impression  Dysphagia Diagnosis: Mild pharyngeal phase dysphagia Clinical impression: Pt. exhibited mild pharyngeal phase  dysphagia primarily due to reduced sensory tract leading to  intermittent penetration with thin liquids (both flash and  remained in vestibule).  Initially, pt. continued to penetrate  despite chin tuck posture,  however chin tuck was re-implemented  at end of study which prevented penetration possibly due to  increased muscle strength as the study progressed.  SLP  recommends Dys 3 (energy conservation) and thin liquids with chin  tuck.  SLP will continue to follow.         Treatment Recommendation  Therapy as outlined in treatment plan below    Diet Recommendation Dysphagia 3 (Mechanical Soft);Thin liquid   Liquid Administration via: Cup;No straw Medication Administration: Whole meds with puree Supervision: Patient able to self feed;Intermittent supervision  to cue for compensatory strategies Compensations: Slow rate;Small sips/bites;Clear throat  intermittently Postural Changes and/or Swallow Maneuvers: Chin tuck;Seated  upright 90 degrees    Other  Recommendations Oral Care Recommendations: Oral care BID   Follow Up Recommendations   (TBD)    Frequency and Duration min 2x/week  2 weeks   Pertinent Vitals/Pain     SLP Swallow Goals Patient will utilize recommended strategies during swallow to  increase swallowing safety with: Minimal cueing   General HPI: 57 y.o. female with PMH of chronic back pain,  history of liver cirrhosis, hypertension, remote CVA presents to  the ER with left leg and arm weakness along with slurring of her  speech, dragging her left leg while walking, and  in addition also  noticed some left flank pain, along with foul-smelling odor and  discoloration of urine. CT head in the ER notable for this and it  indeterminant infarction in the right pons.  CXR revealed  abnormality of the right lower lung which could represent  atelectasis or infectious infiltrate.  BSE recommended an MBS. Type of Study: Modified Barium Swallowing Study Reason for Referral: Objectively evaluate swallowing function Diet Prior to this Study: NPO Temperature Spikes Noted: No Respiratory Status: Room air History of Recent Intubation: No Behavior/Cognition: Alert;Cooperative;Pleasant mood Oral Cavity - Dentition: Adequate natural dentition Oral Motor / Sensory Function: Impaired - see Bedside swallow  eval Patient Positioning: Upright in chair Anatomy: Within functional limits Pharyngeal Secretions: Not observed secondary MBS    Reason for Referral Objectively evaluate swallowing function   Oral Phase Oral Preparation/Oral Phase Oral Phase: WFL   Pharyngeal Phase Pharyngeal Phase Pharyngeal Phase: Impaired Pharyngeal - Nectar Pharyngeal - Nectar Teaspoon: Delayed swallow  initiation;Premature spillage to pyriform sinuses Pharyngeal - Nectar Cup: Delayed swallow initiation;Premature  spillage to pyriform sinuses Pharyngeal - Thin Pharyngeal - Thin Teaspoon: Penetration/Aspiration during  swallow;Reduced airway/laryngeal closure;Reduced laryngeal  elevation Penetration/Aspiration details (thin teaspoon): Material enters  airway, remains ABOVE vocal cords then ejected out Pharyngeal - Thin Cup: Penetration/Aspiration during  swallow;Reduced laryngeal elevation;Reduced tongue base  retraction Penetration/Aspiration details (thin cup): Material enters  airway, remains ABOVE vocal cords and not ejected out;Material  enters airway, remains ABOVE vocal cords then ejected out Pharyngeal - Solids Pharyngeal - Regular: Within functional limits  Cervical Esophageal Phase    GO    Cervical Esophageal Phase  Cervical Esophageal Phase: Impaired Cervical Esophageal Phase - Comment Cervical Esophageal Comment:  (trace residue in below UES)         Royce Macadamia M.Ed CCC-SLP Pager [phone]  10/09/2012   Mr Maxine Glenn Head/brain Wo Cm  10/09/2012   *RADIOLOGY REPORT*  Clinical Data:  Stroke.  Left-sided weakness  MRI HEAD WITHOUT CONTRAST MRA HEAD WITHOUT CONTRAST  Technique:  Multiplanar, multiecho pulse sequences of the brain and surrounding structures were obtained without intravenous contrast. Angiographic images of the head were obtained using MRA technique without contrast.  Comparison:  CT 10/09/2012  MRI HEAD  Findings:  Moderately large acute infarct right pons involving the anterior and mid pons.  No other acute infarct.  Chronic infarcts in the external capsule and thalami bilaterally. Mild chronic microvascular ischemic change in the cerebral white matter.  Negative for hemorrhage or mass.  Ventricle size is normal.  Mucosal thickening and   air-fluid level with frothy secretions in the left maxillary sinus.  IMPRESSION: Acute infarct in the right paramedian pons.  Chronic ischemic change in the external capsule and thalami bilaterally.  Sinusitis with air-fluid level and secretions in the left maxillary sinus.  MRA HEAD  Findings: The   left vertebral artery is widely patent to the basilar.  Left PICA is patent.  Right vertebral artery is nondominant and ends in pica.  There is a moderate to severe stenosis in the mid basilar.  The distal basilar is patent.  Superior cerebellar and posterior cerebral arteries are patent.  Mild atherosclerotic disease in the posterior cerebral arteries bilaterally.  Internal carotid artery is widely patent bilaterally without stenosis.  Anterior cerebral arteries are patent bilaterally. There is mild stenosis in the middle cerebral artery bifurcation bilaterally.  No large vessel occlusion or aneurysm.  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  Mild disease in  the middle cerebral artery bifurcation bilaterally.   Original Report Authenticated By: Janeece Riggers, M.D.    2D ECHO: Study Conclusions  - Left ventricle: The cavity size was normal. Wall thickness was normal. Systolic function was normal. The estimated ejection fraction was in the range of 55% to 60%. Wall motion was normal; there were no regional wall motion abnormalities. Left ventricular diastolic function parameters were normal. - Left atrium: The atrium was normal in size.    Disposition and Follow-up: Discharge Orders   Future Orders Complete By Expires     Diet - low sodium heart healthy  As directed     Discharge instructions  As directed     Comments:      1) Please continue aspirin and Plavix for 3 months, then only Plavix after 3 months , indefinitely 2) always keep yourself hydrated    Increase activity slowly  As directed         DISPOSITION: Home  DIET:Dysphagia 3 diet  ACTIVITY: as tolerated    DISCHARGE FOLLOW-UP Follow-up Information   Call Gates Rigg, MD. (to see Dr Pearlean Brownie in 2 months in stroke clinic)    Contact information:   [address] [place] [address] [phone]       Follow up with Lonia Blood, MD. Schedule an appointment as soon as possible for a visit in 2 weeks. (for hospital follow-up )    Contact information:   [address] [place] [address] [phone]       Time spent on Discharge: 40 mins  Signed:   Lillith Mcneff M.D. [facility] 10/10/2012, 1:11 PM Pager: [phone]

## 2012-10-23 ENCOUNTER — Ambulatory Visit: Payer: Medicaid Other | Attending: Internal Medicine

## 2012-10-23 DIAGNOSIS — R279 Unspecified lack of coordination: Secondary | ICD-10-CM | POA: Insufficient documentation

## 2012-10-23 DIAGNOSIS — IMO0001 Reserved for inherently not codable concepts without codable children: Secondary | ICD-10-CM | POA: Insufficient documentation

## 2012-10-23 DIAGNOSIS — R262 Difficulty in walking, not elsewhere classified: Secondary | ICD-10-CM | POA: Insufficient documentation

## 2012-11-01 ENCOUNTER — Ambulatory Visit: Payer: Medicaid Other | Admitting: Occupational Therapy

## 2012-11-23 ENCOUNTER — Ambulatory Visit: Payer: Medicaid Other | Attending: Internal Medicine

## 2012-11-23 DIAGNOSIS — R279 Unspecified lack of coordination: Secondary | ICD-10-CM | POA: Insufficient documentation

## 2012-11-23 DIAGNOSIS — IMO0001 Reserved for inherently not codable concepts without codable children: Secondary | ICD-10-CM | POA: Insufficient documentation

## 2012-11-23 DIAGNOSIS — R262 Difficulty in walking, not elsewhere classified: Secondary | ICD-10-CM | POA: Insufficient documentation

## 2012-11-27 ENCOUNTER — Ambulatory Visit: Payer: Medicaid Other | Admitting: Physical Therapy

## 2012-11-27 ENCOUNTER — Ambulatory Visit: Payer: Medicaid Other | Admitting: Occupational Therapy

## 2012-11-29 ENCOUNTER — Ambulatory Visit: Payer: Medicaid Other

## 2012-12-04 ENCOUNTER — Ambulatory Visit: Payer: Medicaid Other

## 2012-12-06 ENCOUNTER — Ambulatory Visit: Payer: Medicaid Other

## 2012-12-11 ENCOUNTER — Ambulatory Visit: Payer: Medicaid Other

## 2012-12-13 ENCOUNTER — Ambulatory Visit: Payer: Medicaid Other | Admitting: Occupational Therapy

## 2012-12-13 ENCOUNTER — Ambulatory Visit: Payer: Medicaid Other | Attending: Internal Medicine

## 2012-12-13 DIAGNOSIS — R262 Difficulty in walking, not elsewhere classified: Secondary | ICD-10-CM | POA: Insufficient documentation

## 2012-12-13 DIAGNOSIS — IMO0001 Reserved for inherently not codable concepts without codable children: Secondary | ICD-10-CM | POA: Insufficient documentation

## 2012-12-13 DIAGNOSIS — R279 Unspecified lack of coordination: Secondary | ICD-10-CM | POA: Insufficient documentation

## 2012-12-18 ENCOUNTER — Ambulatory Visit: Payer: Medicaid Other

## 2012-12-18 ENCOUNTER — Ambulatory Visit: Payer: Medicaid Other | Admitting: Occupational Therapy

## 2012-12-20 ENCOUNTER — Ambulatory Visit: Payer: Medicaid Other | Admitting: Occupational Therapy

## 2012-12-20 ENCOUNTER — Ambulatory Visit: Payer: Medicaid Other | Admitting: Physical Therapy

## 2012-12-25 ENCOUNTER — Ambulatory Visit: Payer: Medicaid Other | Admitting: Occupational Therapy

## 2012-12-25 ENCOUNTER — Ambulatory Visit: Payer: Medicaid Other

## 2012-12-27 ENCOUNTER — Ambulatory Visit: Payer: Medicaid Other | Admitting: Occupational Therapy

## 2012-12-27 ENCOUNTER — Ambulatory Visit: Payer: Medicaid Other

## 2013-01-01 ENCOUNTER — Ambulatory Visit: Payer: Medicaid Other | Admitting: Occupational Therapy

## 2013-01-01 ENCOUNTER — Ambulatory Visit: Payer: Medicaid Other

## 2013-01-03 ENCOUNTER — Ambulatory Visit: Payer: Medicaid Other | Admitting: Occupational Therapy

## 2013-01-03 ENCOUNTER — Ambulatory Visit: Payer: Medicaid Other

## 2013-01-08 ENCOUNTER — Ambulatory Visit: Payer: Medicaid Other

## 2013-01-08 ENCOUNTER — Encounter: Payer: Medicaid Other | Admitting: Occupational Therapy

## 2013-01-10 ENCOUNTER — Ambulatory Visit: Payer: Medicaid Other | Admitting: Physical Therapy

## 2013-01-10 ENCOUNTER — Ambulatory Visit: Payer: Medicaid Other | Admitting: Occupational Therapy

## 2013-01-16 ENCOUNTER — Ambulatory Visit: Payer: Medicaid Other | Attending: Internal Medicine | Admitting: Occupational Therapy

## 2013-01-16 DIAGNOSIS — R262 Difficulty in walking, not elsewhere classified: Secondary | ICD-10-CM | POA: Insufficient documentation

## 2013-01-16 DIAGNOSIS — IMO0001 Reserved for inherently not codable concepts without codable children: Secondary | ICD-10-CM | POA: Insufficient documentation

## 2013-01-16 DIAGNOSIS — R279 Unspecified lack of coordination: Secondary | ICD-10-CM | POA: Insufficient documentation

## 2013-01-17 ENCOUNTER — Ambulatory Visit: Payer: Medicaid Other | Admitting: Occupational Therapy

## 2013-10-17 ENCOUNTER — Other Ambulatory Visit: Payer: Self-pay

## 2013-10-17 DIAGNOSIS — Z1231 Encounter for screening mammogram for malignant neoplasm of breast: Secondary | ICD-10-CM

## 2013-10-30 ENCOUNTER — Ambulatory Visit
Admission: RE | Admit: 2013-10-30 | Discharge: 2013-10-30 | Disposition: A | Discharge status: Home / Self Care | Payer: Medicaid Other | Source: Ambulatory Visit

## 2013-10-30 DIAGNOSIS — Z1231 Encounter for screening mammogram for malignant neoplasm of breast: Secondary | ICD-10-CM

## 2013-11-22 ENCOUNTER — Ambulatory Visit: Payer: Medicaid Other | Admitting: Podiatry

## 2013-12-13 ENCOUNTER — Ambulatory Visit: Payer: Medicaid Other | Admitting: Podiatrist

## 2014-03-06 ENCOUNTER — Emergency Department (INDEPENDENT_AMBULATORY_CARE_PROVIDER_SITE_OTHER)
Admission: EM | Admit: 2014-03-06 | Discharge: 2014-03-06 | Disposition: A | Discharge status: Home / Self Care | Payer: Medicaid Other | Source: Home / Self Care | Attending: Family Medicine | Admitting: Family Medicine

## 2014-03-06 ENCOUNTER — Emergency Department (INDEPENDENT_AMBULATORY_CARE_PROVIDER_SITE_OTHER): Payer: Medicaid Other

## 2014-03-06 ENCOUNTER — Encounter (HOSPITAL_COMMUNITY): Payer: Self-pay | Admitting: *Deleted

## 2014-03-06 DIAGNOSIS — I96 Gangrene, not elsewhere classified: Secondary | ICD-10-CM

## 2014-03-06 MED ORDER — CEPHALEXIN 500 MG PO CAPS: 500.0000 mg | ORAL_CAPSULE | Freq: Three times a day (TID) | ORAL | Status: DC

## 2014-03-06 NOTE — ED Provider Notes (Signed)
CSN: [id number]     Arrival date & time 03/06/14  1251 History   None    Chief Complaint  Patient presents with  . Toe Pain   (Consider location/radiation/quality/duration/timing/severity/associated sxs/prior Treatment) HPI Comments: Patient reports gradual discomfort and discoloration of right second toe over the past one month. States her podiatrist removed nail from this toe about one month ago (cannot recall name of provider) and for a period of time (2-3 weeks) after procedure she states toe has been numb. Over last 24-48 hours, she states toe has become painful. No known injury other that above procedure. States toe now appears to be black in color. Denies fever or chills. Denies STS. Is a smoker. Hx of HTN, hyperlipidemia, CVA with residual cognitive deficits, alcohol abuse, and chronic back pain.  States she has not had an alcohol containing drink in 2.5 years.   PCP: Dr. Jerelyn CharlesL. Garba  Patient is a 58 y.o. female presenting with toe pain. The history is provided by the patient.  Toe Pain Episode onset: sx began one month ago. The problem has been gradually worsening.    Past Medical History  Diagnosis Date  . Chronic back pain   . MDD (major depressive disorder)   . Alcohol abuse   . PTSD (post-traumatic stress disorder)   . UTI (lower urinary tract infection)   . Colitis    History reviewed. No pertinent past surgical history. History reviewed. No pertinent family history. History  Substance Use Topics  . Smoking status: Current Every Day Smoker -- 0.50 packs/day    Types: Cigarettes  . Smokeless tobacco: Not on file  . Alcohol Use: No   OB History    No data available     Review of Systems  All other systems reviewed and are negative.   Allergies  Ibuprofen and Penicillins  Home Medications   Prior to Admission medications   Medication Sig Start Date End Date Taking? Authorizing Provider  aspirin EC 81 MG EC tablet Take 1 tablet (81 mg total) by mouth daily.  10/10/12   Ripudeep Jenna LuoK Rai, MD  atorvastatin (LIPITOR) 20 MG tablet Take 1 tablet (20 mg total) by mouth daily. 10/10/12   Ripudeep Jenna LuoK Rai, MD  cephALEXin (KEFLEX) 500 MG capsule Take 1 capsule (500 mg total) by mouth 3 (three) times daily. 03/06/14   Mathis FareJennifer Lee H Rondi Ivy, PA  ciprofloxacin (CIPRO) 500 MG tablet Take 1 tablet (500 mg total) by mouth 2 (two) times daily. X 7 days 10/10/12   Ripudeep Jenna LuoK Rai, MD  clopidogrel (PLAVIX) 75 MG tablet Take 1 tablet (75 mg total) by mouth daily with breakfast. 10/10/12   Ripudeep Jenna LuoK Rai, MD  cyclobenzaprine (FLEXERIL) 5 MG tablet Take 1 tablet (5 mg total) by mouth 3 (three) times daily as needed for muscle spasms. 03/26/12   Arman FilterGail K Schulz, NP  nicotine (NICODERM CQ) 21 mg/24hr patch Place 1 patch onto the skin daily. 10/10/12   Ripudeep Jenna LuoK Rai, MD  oxyCODONE-acetaminophen (PERCOCET/ROXICET) 5-325 MG per tablet Take 1 tablet by mouth every 4 (four) hours as needed. For pain    Historical Provider, MD   BP 116/74 mmHg  Pulse 93  Temp(Src) 98.1 F (36.7 C) (Oral)  Resp 16  SpO2 97% Physical Exam  Constitutional: She is oriented to person, place, and time. She appears well-developed and well-nourished. No distress.  HENT:  Head: Normocephalic and atraumatic.  Cardiovascular: Normal rate, regular rhythm and normal heart sounds.   Pulmonary/Chest: Effort normal and breath  sounds normal.  Musculoskeletal:       Right foot: There is decreased range of motion.       Feet:  Neurological: She is alert and oriented to person, place, and time.  Skin: Skin is warm and dry.  Psychiatric: She has a normal mood and affect. Her behavior is normal.  Nursing note and vitals reviewed.   ED Course  Procedures (including critical care time) Labs Review Labs Reviewed - No data to display  Imaging Review Dg Foot Complete Right  03/06/2014   CLINICAL DATA:  Second toe swelling and bruising, surgery 1 month ago  EXAM: RIGHT FOOT COMPLETE - 3+ VIEW  COMPARISON:  None.   FINDINGS: Three views of the right foot submitted. No acute fracture or subluxation. No definite bone destruction to suggest osteomyelitis.  IMPRESSION: No acute fracture or subluxation. No definite bone destruction to suggest osteomyelitis.   Electronically Signed   By: Natasha MeadLiviu  Pop M.D.   On: 03/06/2014 13:53     MDM   1. Gangrene of toe   2. Gangrene   Films without evidence of osteomyelitis. Inevitably, this toe will need to be removed by patient's podiatrist. There is no clinical evidence of active infection today, however, I feel it is only a matter of time before this necrotic tissue becomes infected. Will begin antibiotic prophylaxis with Keflex until patient can follow up with her podiatrist in the next few days. Will advise that if surrounding tissue develops redness or swelling, or if she develops fever, she is to report to her nearest ER or to her PCP as soon as possible for re-evaluation. Case discussed with Dr. Konrad DoloresMerrell.   Ria ClockJennifer Lee H Miaisabella Bacorn, [place] 03/06/14 1415

## 2014-03-06 NOTE — ED Notes (Signed)
Pt  Reports  She  Has  A  painfull     r  2nd    Toe       It  Is  Black      And  painfull  At  Times        Feels  Numb  At  Times    She  States  She  Went to  A  Podiatrist  1  Month  Ago          And  Had   Toenail  Work  Done         She  States  It  Is  Better     r  Pedal pulse  Is  Strong

## 2014-03-06 NOTE — ED Notes (Signed)
Pt  Niece  In discharge  Plan  reviwed  With  Her as  Well  As the  pt

## 2014-03-06 NOTE — Discharge Instructions (Signed)
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 You will need to have this toe removed and I would advise you to contact your foot doctor (podiatrist) as soon as possible to schedule evaluation for this procedure. Eventually this area will become infected if not removed. I will place you on antibiotics in hopes of avoiding infection before you are able to see your doctor. Continue taking your other medications as prescribed. If you develop fever, redness or swelling or surrounding tissue, your will need to be re-evaluated right away and this may require a trip to your nearest Emergency Room if your doctor is unable to see you. Please wear protective footwear to avoid further injury to toe.   Gangrene Gangrene is the decay or death of an organ or tissue caused by loss of blood supply. It may be caused by infectious or inflammatory processes. Or it may be caused by degenerative changes in long-standing diseases such as diabetes. It may also occur following injuries or surgical procedures. TYPES OF GANGRENE There are three major types of gangrene: dry, moist, and gaseous (a type of moist gangrene).  Dry gangrene is a condition that results when one or more arteries become blocked. Arteries are muscular vessels which carry blood away from the heart and around the body. In this type of gangrene, the tissue slowly dies, due to loss of blood supply. But it does not become infected. The affected area becomes cold and black. It begins to dry out and wither. Eventually it drops off over a period of weeks or months. Dry gangrene is most common in people with advanced blockages of the arteries (arteriosclerosis or hardening of the arteries) resulting from diabetes.  Moist gangrene occurs in the toes, feet, or legs after a crushing injury. Or it can be a result of some other problem that causes blood flow in an area to suddenly stop. When blood flow stops, bacteria begin to invade the muscle  and grow. It multiplies quickly, without the body's immune system stopping it.  Gaseous gangrene is also called muscle death (myonecrosis). It is a type of moist gangrene commonly caused by a bacterial infection. This is usually caused by a bacteria which can live in an area of little or no oxygen. Once present in tissue, these bacteria produce gases and poisonous toxins as they grow. These bacteria are normally found in the gut, respiratory, and female urinary tract. They often infect thigh amputation wounds commonly in people who have lost control of their bowel functions (incontinence). Gangrene, incontinence, and weakness often are found in patients with diabetes. It is in the amputation stump of diabetic patients that gaseous gangrene often happens. The most common bacteria to cause gaseous gangrene is clostridia. Other bacteria which cause moist gangrene include streptococcus and staphylococcus. A serious, but rare form of infection with group A streptococci can slow blood flow. If untreated, it can progress to gangrene. This is more commonly called necrotizing fasciitis. This is an infection of the skin and tissues directly beneath the skin. This is sometimes called the "flesh-eating bacteria". It is called this because it can rapidly kill large areas of flesh in the body. It requires immediate surgical treatment or it will result in death. CAUSES   Some illnesses can cause gangrene because the vessels carrying the blood are already diseased. Examples are:  Long-standing illnesses, such as diabetes mellitus or arteriosclerosis.  Other diseases affecting the blood vessels, such as Buerger disease or Raynaud disease. Other causes of gangrene  include:   Open bone breaks.  Burns.  Injections given under the skin or in a muscle.  Gangrene may occur following surgery, particularly in individuals with diabetes or other long-term (chronic) disease.  Gas gangrene can be also a complication of dry  gangrene. Or it can happen suddenly along with an underlying cancer. Moist gangrene and gas gangrene are different. Gas gangrene usually involves only the muscle. It does not involve the skin as much. In moist or gas gangrene, there is a feeling of heaviness in the affected area that is followed by severe pain. The pain is caused by swelling from fluid or gas accumulation in the tissues. This pain is the worst, on average, between 1 and 4 days following the injury. It can range from several hours to several weeks. The swollen skin may at first be blistered, red, and warm to the touch. Then it changes to a bronze, brown, or black color. In the majority of cases, the affected and surrounding tissues may give off crackling sounds (crepitus). This is a result of gas bubbles accumulating under the skin. The gas may be felt beneath the skin. In wet gangrene, the pus is foul-smelling. In gas gangrene, there is no true pus, just an almost "sweet" smelling watery discharge. If the bacterial toxins spread to the bloodstream, the following may happen:  A higher than normal temperature.  Fast heart rate and breathing.  Changed mental state.  Loss of appetite.  Diarrhea.  Vomiting.  Shock. SYMPTOMS  Areas of either dry or moist gangrene are first noticed as a red line on the skin that marks the border of the affected tissues.  As tissues begin to die, dry gangrene may cause pain in the early stages. Or it may go unnoticed.  This happens especially in the elderly or in individuals with decreased ability to feel pain.  At first, the area becomes cold, numb, and pale. Later its color changes to brown, then black. This dead tissue will gradually separate from the healthy tissue and fall off. DIAGNOSIS  Diagnosis of gangrene is based on patient history, physical examination, and results of blood and other lab tests.  A caregiver will look for a history of:  Recent damage caused by an accident  (trauma).  Surgery.  Cancer.  Chronic disease.  Blood tests can be used to find out if infection is present and find how much it has spread.  Drainage from a wound, or a tissue sample obtained through surgical exploration, may be cultured to:  Identify the bacteria causing the infection.  Help in figuring out which antibiotic will be most effective.  Gas accumulation and muscle death (myonecrosis) may not be visible. So X-ray studies and more sophisticated imaging techniques may be helpful in making a diagnosis. They include:  Computed tomography (CT) scans.  Magnetic resonance imaging (MRI).  These techniques are not sufficient alone to provide an accurate diagnosis of gangrene, though.  Precise diagnosis of gas gangrene often requires surgical exploration of the wound.  During such a procedure, the exposed muscle may appear pale, beefy-red. In the most advanced stages, it may be black. If infected, the muscle will fail to contract with stimulation, and the cut surface will not bleed. TREATMENT  Gaseous gangrene is a medical emergency. It requires immediate surgery and antibiotics. If the infection spreads to the bloodstream and infects vital organs, it can rapidly result in death.   Areas of dry gangrene that remain free from infection (aseptic) in the arms  or hands and legs or feet (extremities) are often left to wither and fall off. Treatments applied to the wound on the surface are generally not effective without enough blood supply to support wound healing.  Evaluation by a vascular surgeon, along with X-rays to determine blood supply and circulation to the affected area, can help figure out if surgery would be helpful.  Once the cause of infection is found, moist gangrene requires immediate intravenous, intramuscular, or topical broad-spectrum antibiotic therapy. Also, the infected tissue must be removed surgically. Amputation of the affected limb may be needed. Pain  medicines (analgesics) are prescribed for pain. Fluids injected into the veins (intravenous fluids) and, occasionally, blood transfusions are used to treat shock and replenish red blood cells and electrolytes. Plenty of water and healthy foods are needed for wound healing.  Some cases of gangrene are treated by giving oxygen under pressure greater than that of the atmosphere (hyperbaric) to the patient. This happens in a specially designed chamber. The idea behind using hyperbaric oxygen is that more oxygen will become dissolved in the patient's bloodstream. So more oxygen will be delivered to the gangrenous areas. By providing the right amount of oxygenation, the body's ability to fight off the bacterial infection is believed to be improved. There is a direct harmful effect on the bacteria that thrive in an oxygen-free environment. Some studies have shown that the use of hyperbaric oxygen relieves pain, reduces the number of amputations required, and reduces the extent of surgical debridement (removal of dead or damaged tissue).  The emotional needs of the patient must also be met. The individual with gangrene should be offered moral support, along with an opportunity to share questions and concerns. In addition, particularly in cases where amputation was required; physical, vocational, and rehabilitation therapy will also be required. Except in cases where the infection has spread to the bloodstream, the result of treated gangrene is usually favorable.  PREVENTION  Patients with diabetes or severe arteriosclerosis should take special care of their hands and feet. This is due to the risk of infection associated with even minor injuries. Education about good foot care is important. Poor blood flow as a result diseased blood vessels will lessen the body's defenses against invading germs. Your caregiver will take steps to improve circulation whenever possible. All injuries to skin or tissue should be cared for  immediately. Dying or infected skin should be removed immediately. This will prevent the spread of bacteria. Wounds into the abdomen should be surgically treated and damage repaired early. This should be followed up with antibiotic treatment. Patients undergoing non-emergency (elective) intestinal surgery may receive preventive antibiotic therapy. Use of antibiotics prior to and directly following surgery has been shown to significantly reduce the rate of infection.  SEEK IMMEDIATE MEDICAL CARE IF:  You show signs of gaseous gangrene. MAKE SURE YOU:   Understand these instructions.  Will watch your condition.  Will get help right away if you are not doing well or get worse. Document Released: 12/31/2003 Document Revised: 08/30/2011 Document Reviewed: 04/24/2013 [facility] Patient Information 2015 [place], [place]. This information is not intended to replace advice given to you by your health care provider. Make sure you discuss any questions you have with your health care provider.

## 2014-05-22 ENCOUNTER — Inpatient Hospital Stay (HOSPITAL_COMMUNITY): Payer: Medicaid Other

## 2014-05-22 ENCOUNTER — Emergency Department (HOSPITAL_COMMUNITY): Payer: Medicaid Other

## 2014-05-22 ENCOUNTER — Inpatient Hospital Stay (HOSPITAL_COMMUNITY)
Admission: EM | Admit: 2014-05-22 | Discharge: 2014-05-24 | DRG: 378 | Disposition: A | Discharge status: Home / Self Care | Payer: Medicaid Other | Attending: Internal Medicine | Admitting: Internal Medicine

## 2014-05-22 ENCOUNTER — Encounter (HOSPITAL_COMMUNITY): Payer: Self-pay

## 2014-05-22 DIAGNOSIS — Z79891 Long term (current) use of opiate analgesic: Secondary | ICD-10-CM

## 2014-05-22 DIAGNOSIS — K861 Other chronic pancreatitis: Secondary | ICD-10-CM | POA: Diagnosis present

## 2014-05-22 DIAGNOSIS — Z79899 Other long term (current) drug therapy: Secondary | ICD-10-CM

## 2014-05-22 DIAGNOSIS — F17210 Nicotine dependence, cigarettes, uncomplicated: Secondary | ICD-10-CM | POA: Diagnosis present

## 2014-05-22 DIAGNOSIS — R740 Nonspecific elevation of levels of transaminase and lactic acid dehydrogenase [LDH]: Secondary | ICD-10-CM | POA: Diagnosis present

## 2014-05-22 DIAGNOSIS — E785 Hyperlipidemia, unspecified: Secondary | ICD-10-CM

## 2014-05-22 DIAGNOSIS — D696 Thrombocytopenia, unspecified: Secondary | ICD-10-CM | POA: Diagnosis not present

## 2014-05-22 DIAGNOSIS — B1920 Unspecified viral hepatitis C without hepatic coma: Secondary | ICD-10-CM | POA: Diagnosis present

## 2014-05-22 DIAGNOSIS — K922 Gastrointestinal hemorrhage, unspecified: Secondary | ICD-10-CM | POA: Insufficient documentation

## 2014-05-22 DIAGNOSIS — D649 Anemia, unspecified: Secondary | ICD-10-CM

## 2014-05-22 DIAGNOSIS — I69398 Other sequelae of cerebral infarction: Secondary | ICD-10-CM | POA: Diagnosis not present

## 2014-05-22 DIAGNOSIS — K250 Acute gastric ulcer with hemorrhage: Principal | ICD-10-CM | POA: Diagnosis present

## 2014-05-22 DIAGNOSIS — Z7902 Long term (current) use of antithrombotics/antiplatelets: Secondary | ICD-10-CM

## 2014-05-22 DIAGNOSIS — Z7982 Long term (current) use of aspirin: Secondary | ICD-10-CM | POA: Diagnosis not present

## 2014-05-22 DIAGNOSIS — H548 Legal blindness, as defined in USA: Secondary | ICD-10-CM | POA: Diagnosis present

## 2014-05-22 DIAGNOSIS — R569 Unspecified convulsions: Secondary | ICD-10-CM | POA: Diagnosis present

## 2014-05-22 DIAGNOSIS — I9589 Other hypotension: Secondary | ICD-10-CM

## 2014-05-22 DIAGNOSIS — F1020 Alcohol dependence, uncomplicated: Secondary | ICD-10-CM | POA: Diagnosis present

## 2014-05-22 DIAGNOSIS — D62 Acute posthemorrhagic anemia: Secondary | ICD-10-CM | POA: Diagnosis present

## 2014-05-22 DIAGNOSIS — Z658 Other specified problems related to psychosocial circumstances: Secondary | ICD-10-CM

## 2014-05-22 DIAGNOSIS — K921 Melena: Secondary | ICD-10-CM

## 2014-05-22 DIAGNOSIS — W19XXXA Unspecified fall, initial encounter: Secondary | ICD-10-CM

## 2014-05-22 DIAGNOSIS — K920 Hematemesis: Secondary | ICD-10-CM | POA: Diagnosis present

## 2014-05-22 DIAGNOSIS — E782 Mixed hyperlipidemia: Secondary | ICD-10-CM | POA: Diagnosis present

## 2014-05-22 DIAGNOSIS — K859 Acute pancreatitis without necrosis or infection, unspecified: Secondary | ICD-10-CM | POA: Diagnosis present

## 2014-05-22 DIAGNOSIS — Z8673 Personal history of transient ischemic attack (TIA), and cerebral infarction without residual deficits: Secondary | ICD-10-CM

## 2014-05-22 DIAGNOSIS — K253 Acute gastric ulcer without hemorrhage or perforation: Secondary | ICD-10-CM | POA: Insufficient documentation

## 2014-05-22 DIAGNOSIS — K700 Alcoholic fatty liver: Secondary | ICD-10-CM

## 2014-05-22 HISTORY — DX: Cerebral infarction, unspecified: I63.9

## 2014-05-22 HISTORY — DX: Inflammatory liver disease, unspecified: K75.9

## 2014-05-22 HISTORY — DX: Acute pancreatitis without necrosis or infection, unspecified: K85.90

## 2014-05-22 LAB — RAPID URINE DRUG SCREEN, HOSP PERFORMED
AMPHETAMINES: NOT DETECTED
BARBITURATES: NOT DETECTED
Benzodiazepines: NOT DETECTED
Cocaine: NOT DETECTED
Opiates: NOT DETECTED
Tetrahydrocannabinol: NOT DETECTED

## 2014-05-22 LAB — CBC
HCT: 25.7 % — ABNORMAL LOW (ref 36.0–46.0)
HCT: 22.7 % — ABNORMAL LOW (ref 36.0–46.0)
HCT: 21.8 % — ABNORMAL LOW (ref 36.0–46.0)
Hemoglobin: 8.5 g/dL — ABNORMAL LOW (ref 12.0–15.0)
Hemoglobin: 7.7 g/dL — ABNORMAL LOW (ref 12.0–15.0)
Hemoglobin: 7.2 g/dL — ABNORMAL LOW (ref 12.0–15.0)
MCH: 30.6 pg (ref 26.0–34.0)
MCH: 31.4 pg (ref 26.0–34.0)
MCH: 30.4 pg (ref 26.0–34.0)
MCHC: 33.1 g/dL (ref 30.0–36.0)
MCHC: 33.9 g/dL (ref 30.0–36.0)
MCHC: 33 g/dL (ref 30.0–36.0)
MCV: 92.4 fL (ref 78.0–100.0)
MCV: 92.7 fL (ref 78.0–100.0)
MCV: 92 fL (ref 78.0–100.0)
PLATELETS: 129 10*3/uL — AB (ref 150–400)
Platelets: 154 10*3/uL (ref 150–400)
Platelets: 130 10*3/uL — ABNORMAL LOW (ref 150–400)
RBC: 2.78 MIL/uL — ABNORMAL LOW (ref 3.87–5.11)
RBC: 2.45 MIL/uL — ABNORMAL LOW (ref 3.87–5.11)
RBC: 2.37 MIL/uL — ABNORMAL LOW (ref 3.87–5.11)
RDW: 13.2 % (ref 11.5–15.5)
RDW: 13.2 % (ref 11.5–15.5)
RDW: 13.2 % (ref 11.5–15.5)
WBC: 9 10*3/uL (ref 4.0–10.5)
WBC: 9.2 10*3/uL (ref 4.0–10.5)
WBC: 7.6 10*3/uL (ref 4.0–10.5)

## 2014-05-22 LAB — CBC WITH DIFFERENTIAL/PLATELET
Basophils Absolute: 0 10*3/uL (ref 0.0–0.1)
Basophils Relative: 0 % (ref 0–1)
EOS PCT: 0 % (ref 0–5)
Eosinophils Absolute: 0 10*3/uL (ref 0.0–0.7)
HCT: 29 % — ABNORMAL LOW (ref 36.0–46.0)
HEMOGLOBIN: 9.9 g/dL — AB (ref 12.0–15.0)
LYMPHS ABS: 3 10*3/uL (ref 0.7–4.0)
Lymphocytes Relative: 24 % (ref 12–46)
MCH: 31.3 pg (ref 26.0–34.0)
MCHC: 34.1 g/dL (ref 30.0–36.0)
MCV: 91.8 fL (ref 78.0–100.0)
MONO ABS: 0.9 10*3/uL (ref 0.1–1.0)
MONOS PCT: 7 % (ref 3–12)
NEUTROS ABS: 8.7 10*3/uL — AB (ref 1.7–7.7)
NEUTROS PCT: 69 % (ref 43–77)
PLATELETS: 169 10*3/uL (ref 150–400)
RBC: 3.16 MIL/uL — AB (ref 3.87–5.11)
RDW: 12.9 % (ref 11.5–15.5)
WBC: 12.6 10*3/uL — ABNORMAL HIGH (ref 4.0–10.5)

## 2014-05-22 LAB — MAGNESIUM: Magnesium: 1.8 mg/dL (ref 1.5–2.5)

## 2014-05-22 LAB — COMPREHENSIVE METABOLIC PANEL
ALBUMIN: 3.6 g/dL (ref 3.5–5.2)
ALK PHOS: 54 U/L (ref 39–117)
ALT: 72 U/L — AB (ref 0–35)
ALT: 63 U/L — AB (ref 0–35)
ANION GAP: 8 (ref 5–15)
AST: 58 U/L — ABNORMAL HIGH (ref 0–37)
AST: 54 U/L — AB (ref 0–37)
Albumin: 3.4 g/dL — ABNORMAL LOW (ref 3.5–5.2)
Alkaline Phosphatase: 54 U/L (ref 39–117)
Anion gap: 4 — ABNORMAL LOW (ref 5–15)
BILIRUBIN TOTAL: 1 mg/dL (ref 0.3–1.2)
BILIRUBIN TOTAL: 1 mg/dL (ref 0.3–1.2)
BUN: 50 mg/dL — ABNORMAL HIGH (ref 6–23)
BUN: 30 mg/dL — AB (ref 6–23)
CALCIUM: 8.3 mg/dL — AB (ref 8.4–10.5)
CHLORIDE: 114 mmol/L — AB (ref 96–112)
CHLORIDE: 119 mmol/L — AB (ref 96–112)
CO2: 22 mmol/L (ref 19–32)
CO2: 20 mmol/L (ref 19–32)
Calcium: 8.1 mg/dL — ABNORMAL LOW (ref 8.4–10.5)
Creatinine, Ser: 0.93 mg/dL (ref 0.50–1.10)
Creatinine, Ser: 0.9 mg/dL (ref 0.50–1.10)
GFR calc non Af Amer: 65 mL/min — ABNORMAL LOW (ref >90)
GFR calc non Af Amer: 68 mL/min — ABNORMAL LOW (ref >90)
GFR, EST AFRICAN AMERICAN: 76 mL/min — AB (ref >90)
GFR, EST AFRICAN AMERICAN: 79 mL/min — AB (ref >90)
GLUCOSE: 96 mg/dL (ref 70–99)
GLUCOSE: 78 mg/dL (ref 70–99)
Potassium: 3.3 mmol/L — ABNORMAL LOW (ref 3.5–5.1)
Potassium: 3.6 mmol/L (ref 3.5–5.1)
Sodium: 144 mmol/L (ref 135–145)
Sodium: 143 mmol/L (ref 135–145)
Total Protein: 6.3 g/dL (ref 6.0–8.3)
Total Protein: 6.4 g/dL (ref 6.0–8.3)

## 2014-05-22 LAB — TROPONIN I

## 2014-05-22 LAB — PROTIME-INR
INR: 1.25 (ref 0.00–1.49)
PROTHROMBIN TIME: 15.8 s — AB (ref 11.6–15.2)

## 2014-05-22 LAB — URINALYSIS, ROUTINE W REFLEX MICROSCOPIC
Bilirubin Urine: NEGATIVE
GLUCOSE, UA: NEGATIVE mg/dL
Ketones, ur: 15 mg/dL — AB
NITRITE: NEGATIVE
Protein, ur: NEGATIVE mg/dL
Specific Gravity, Urine: 1.024 (ref 1.005–1.030)
Urobilinogen, UA: 0.2 mg/dL (ref 0.0–1.0)
pH: 6 (ref 5.0–8.0)

## 2014-05-22 LAB — URINE MICROSCOPIC-ADD ON

## 2014-05-22 LAB — LACTIC ACID, PLASMA: LACTIC ACID, VENOUS: 1.3 mmol/L (ref 0.5–2.0)

## 2014-05-22 LAB — PREPARE RBC (CROSSMATCH)

## 2014-05-22 LAB — POC OCCULT BLOOD, ED: Fecal Occult Bld: POSITIVE — AB

## 2014-05-22 LAB — APTT: aPTT: 24 seconds (ref 24–37)

## 2014-05-22 LAB — ABO/RH: ABO/RH(D): B NEG

## 2014-05-22 LAB — ETHANOL: Alcohol, Ethyl (B): <5 mg/dL (ref 0–9)

## 2014-05-22 MED ORDER — PANTOPRAZOLE SODIUM 40 MG IV SOLR: 40.0000 mg | Freq: Two times a day (BID) | INTRAVENOUS | Status: DC

## 2014-05-22 MED ORDER — SODIUM CHLORIDE 0.9 % IV BOLUS (SEPSIS)
1000.0000 mL | Freq: Once | INTRAVENOUS | Status: AC
  Administered 2014-05-22: 1000 mL via INTRAVENOUS

## 2014-05-22 MED ORDER — PANTOPRAZOLE SODIUM 40 MG IV SOLR
8.0000 mg/h | INTRAVENOUS | Status: DC
  Administered 2014-05-22 – 2014-05-23 (×3): 8 mg/h via INTRAVENOUS
  Filled 2014-05-22 (×7): qty 80

## 2014-05-22 MED ORDER — MORPHINE SULFATE 2 MG/ML IJ SOLN
1.0000 mg | INTRAMUSCULAR | Status: DC | PRN
  Administered 2014-05-22 (×2): 1 mg via INTRAVENOUS
  Filled 2014-05-22 (×2): qty 1

## 2014-05-22 MED ORDER — SODIUM CHLORIDE 0.9 % IV SOLN
Freq: Once | INTRAVENOUS | Status: DC
Start: 2014-05-22 — End: 2014-05-24

## 2014-05-22 MED ORDER — PNEUMOCOCCAL VAC POLYVALENT 25 MCG/0.5ML IJ INJ
0.5000 mL | INJECTION | INTRAMUSCULAR | Status: AC
  Administered 2014-05-24: 0.5 mL via INTRAMUSCULAR
  Filled 2014-05-22: qty 0.5

## 2014-05-22 MED ORDER — SODIUM CHLORIDE 0.9 % IV SOLN
INTRAVENOUS | Status: DC
  Administered 2014-05-22: 18:00:00 via INTRAVENOUS
  Administered 2014-05-22: 100 mL/h via INTRAVENOUS
  Administered 2014-05-23: 05:00:00 via INTRAVENOUS

## 2014-05-22 MED ORDER — ACETAMINOPHEN 650 MG RE SUPP: 650.0000 mg | Freq: Four times a day (QID) | RECTAL | Status: DC | PRN

## 2014-05-22 MED ORDER — SODIUM CHLORIDE 0.9 % IV SOLN
INTRAVENOUS | Status: DC
  Administered 2014-05-22: 05:00:00 via INTRAVENOUS

## 2014-05-22 MED ORDER — POTASSIUM CHLORIDE 10 MEQ/100ML IV SOLN
10.0000 meq | INTRAVENOUS | Status: AC
  Administered 2014-05-22 (×2): 10 meq via INTRAVENOUS
  Filled 2014-05-22 (×2): qty 100

## 2014-05-22 MED ORDER — SODIUM CHLORIDE 0.9 % IV BOLUS (SEPSIS)
1000.0000 mL | Freq: Once | INTRAVENOUS | Status: AC
Start: 2014-05-22 — End: 2014-05-22
  Administered 2014-05-22: 1000 mL via INTRAVENOUS

## 2014-05-22 MED ORDER — ONDANSETRON HCL 4 MG/2ML IJ SOLN: 4.0000 mg | Freq: Four times a day (QID) | INTRAMUSCULAR | Status: DC | PRN

## 2014-05-22 MED ORDER — SODIUM CHLORIDE 0.9 % IV SOLN
80.0000 mg | Freq: Once | INTRAVENOUS | Status: AC
  Administered 2014-05-22: 80 mg via INTRAVENOUS
  Filled 2014-05-22: qty 80

## 2014-05-22 MED ORDER — ONDANSETRON HCL 4 MG/2ML IJ SOLN
4.0000 mg | Freq: Once | INTRAMUSCULAR | Status: AC
  Administered 2014-05-22: 4 mg via INTRAVENOUS
  Filled 2014-05-22: qty 2

## 2014-05-22 MED ORDER — FENTANYL CITRATE 0.05 MG/ML IJ SOLN
50.0000 ug | Freq: Once | INTRAMUSCULAR | Status: AC
  Administered 2014-05-22: 50 ug via INTRAVENOUS
  Filled 2014-05-22: qty 2

## 2014-05-22 MED ORDER — ACETAMINOPHEN 325 MG PO TABS: 650.0000 mg | ORAL_TABLET | Freq: Four times a day (QID) | ORAL | Status: DC | PRN

## 2014-05-22 MED ORDER — ONDANSETRON HCL 4 MG PO TABS: 4.0000 mg | ORAL_TABLET | Freq: Four times a day (QID) | ORAL | Status: DC | PRN

## 2014-05-22 NOTE — H&P (Signed)
[facility] History and Physical  Alexa Francoislice Caslin ZOX:[id number] DOB: 04/10/1955 DOA: 05/22/2014  Referring physician: ER physician. PCP: Lonia BloodGARBA,LAWAL, MD   Chief Complaint: Throwing up blood.  HPI: [name] is a 59 y.o. female history of CVA, previous history of GI bleed has had endoscopy in 2008 which showed gastritis presents to the ER because of throwing up blood. Patient states that she threw up blood twice yesterday. She felt dizzy but did not lose consciousness. Denies any chest pain or abdominal pain. Patient states she has been noticing melanotic stools. Patient denies taking any NSAIDs. Patient is on Plavix and aspirin for her stroke. In the ER patient was found to be hypotensive and tachycardic and was given fluid boluses for language patient's blood pressure improved. On-call gastroenterologist Dr. Marina GoodellPerry was consulted and will be seen in consult. Patient states she does not drink alcohol as previously and only once or twice a month.  Review of Systems: As presented in the history of presenting illness, rest negative.  Past Medical History  Diagnosis Date  . Chronic back pain   . MDD (major depressive disorder)   . Alcohol abuse   . PTSD (post-traumatic stress disorder)   . UTI (lower urinary tract infection)   . Colitis   . Stroke   . Pancreatitis    Past Surgical History  Procedure Laterality Date  . Esophagogastroduodenoscopy endoscopy     Social History:  reports that she has been smoking Cigarettes.  She has been smoking about 0.50 packs per day. She does not have any smokeless tobacco history on file. She reports that she drinks alcohol. She reports that she does not use illicit drugs. Where does patient live home. Can patient participate in ADLs? Yes.  Allergies  Allergen Reactions  . Banana Other (See Comments)    Welts on skin and sores in mouth  . Ibuprofen Other (See Comments)    blisters  . Penicillins Other (See Comments)    blister    Family  History: History reviewed. No pertinent family history.    Prior to Admission medications   Medication Sig Start Date End Date Taking? Authorizing Provider  aspirin EC 81 MG EC tablet Take 1 tablet (81 mg total) by mouth daily. 10/10/12   Ripudeep Jenna LuoK Rai, MD  atorvastatin (LIPITOR) 20 MG tablet Take 1 tablet (20 mg total) by mouth daily. 10/10/12   Ripudeep Jenna LuoK Rai, MD  cephALEXin (KEFLEX) 500 MG capsule Take 1 capsule (500 mg total) by mouth 3 (three) times daily. 03/06/14   Mathis FareJennifer Lee H Presson, PA  ciprofloxacin (CIPRO) 500 MG tablet Take 1 tablet (500 mg total) by mouth 2 (two) times daily. X 7 days 10/10/12   Ripudeep Jenna LuoK Rai, MD  clopidogrel (PLAVIX) 75 MG tablet Take 1 tablet (75 mg total) by mouth daily with breakfast. 10/10/12   Ripudeep Jenna LuoK Rai, MD  cyclobenzaprine (FLEXERIL) 5 MG tablet Take 1 tablet (5 mg total) by mouth 3 (three) times daily as needed for muscle spasms. 03/26/12   Earley FavorGail Schulz, NP  nicotine (NICODERM CQ) 21 mg/24hr patch Place 1 patch onto the skin daily. 10/10/12   Ripudeep Jenna LuoK Rai, MD  oxyCODONE-acetaminophen (PERCOCET/ROXICET) 5-325 MG per tablet Take 1 tablet by mouth every 4 (four) hours as needed. For pain    Historical Provider, MD    Physical Exam: Filed Vitals:   05/22/14 0445 05/22/14 0500 05/22/14 0545 05/22/14 0645  BP: 125/64 117/58 119/59 105/64  Pulse: 113 107 100 100  Temp:  TempSrc:      Resp: Height:      Weight:      SpO2: 100% 100% 100% 100%     General:  Moderately built and poorly nourished.  Eyes: anicteric no pallor.  ENT: No discharge from the ears eyes nose and mouth.  Neck: No mass felt.  Cardiovascular: S1-S2 heard.  Respiratory: No rhonchi or crepitations.  Abdomen: Soft nontender bowel sounds present.  Skin: No rash.  Musculoskeletal: No edema.  Psychiatric: Appears normal.  Neurologic: Alert awake oriented to time place and person. Moves all extremities.  Labs on Admission:  Basic Metabolic  Panel:  Recent Labs Lab 05/22/14 0409  NA 144  K 3.3*  CL 114*  CO2 22  GLUCOSE 96  BUN 50*  CREATININE 0.93  CALCIUM 8.3*   Liver Function Tests:  Recent Labs Lab 05/22/14 0409  AST 58*  ALT 72*  ALKPHOS 54  BILITOT 1.0  PROT 6.3  ALBUMIN 3.4*   No results for input(s): LIPASE, AMYLASE in the last 168 hours. No results for input(s): AMMONIA in the last 168 hours. CBC:  Recent Labs Lab 05/22/14 0409  WBC 12.6*  NEUTROABS 8.7*  HGB 9.9*  HCT 29.0*  MCV 91.8  PLT 169   Cardiac Enzymes:  Recent Labs Lab 05/22/14 0409  TROPONINI <0.03    BNP (last 3 results) No results for input(s): BNP in the last 8760 hours.  ProBNP (last 3 results) No results for input(s): PROBNP in the last 8760 hours.  CBG: No results for input(s): GLUCAP in the last 168 hours.  Radiological Exams on Admission: Ct Head Wo Contrast  05/22/2014   CLINICAL DATA:  On unwitnessed fall.  Dizziness and lightheaded.  EXAM: CT HEAD WITHOUT CONTRAST  CT CERVICAL SPINE WITHOUT CONTRAST  TECHNIQUE: Multidetector CT imaging of the head and cervical spine was performed following the standard protocol without intravenous contrast. Multiplanar CT image reconstructions of the cervical spine were also generated.  COMPARISON:  Head CT and MRI 10/09/2012  FINDINGS: CT HEAD FINDINGS  No intracranial hemorrhage, mass effect, or midline shift. No hydrocephalus. The basilar cisterns are patent. No evidence of territorial infarct. No intracranial fluid collection. Linear low attenuation in the right and left external capsule, unchanged. Calvarium is intact. Improved aeration of left maxillary sinus with mild residual mucosal thickening. The mastoid air cells are well aerated.  CT CERVICAL SPINE FINDINGS  Cervical spine alignment is maintained. Vertebral body heights are preserved. There is no fracture. The dens is intact. There are no jumped or perched facets. There is disc space narrowing at C5-C6. Mild endplate  spurring from C4-C5 through C6-C7. No prevertebral soft tissue edema. Tiny focus of atelectasis at the right lung apex.  IMPRESSION: 1.  No acute intracranial abnormality. 2. No fracture or subluxation of the cervical spine.   Electronically Signed   By: Rubye Oaks M.D.   On: 05/22/2014 03:58   Ct Cervical Spine Wo Contrast  05/22/2014   CLINICAL DATA:  On unwitnessed fall.  Dizziness and lightheaded.  EXAM: CT HEAD WITHOUT CONTRAST  CT CERVICAL SPINE WITHOUT CONTRAST  TECHNIQUE: Multidetector CT imaging of the head and cervical spine was performed following the standard protocol without intravenous contrast. Multiplanar CT image reconstructions of the cervical spine were also generated.  COMPARISON:  Head CT and MRI 10/09/2012  FINDINGS: CT HEAD FINDINGS  No intracranial hemorrhage, mass effect, or midline shift. No hydrocephalus. The basilar cisterns are patent. No evidence  of territorial infarct. No intracranial fluid collection. Linear low attenuation in the right and left external capsule, unchanged. Calvarium is intact. Improved aeration of left maxillary sinus with mild residual mucosal thickening. The mastoid air cells are well aerated.  CT CERVICAL SPINE FINDINGS  Cervical spine alignment is maintained. Vertebral body heights are preserved. There is no fracture. The dens is intact. There are no jumped or perched facets. There is disc space narrowing at C5-C6. Mild endplate spurring from C4-C5 through C6-C7. No prevertebral soft tissue edema. Tiny focus of atelectasis at the right lung apex.  IMPRESSION: 1.  No acute intracranial abnormality. 2. No fracture or subluxation of the cervical spine.   Electronically Signed   By: Rubye Oaks M.D.   On: 05/22/2014 03:58   Dg Abd Acute W/chest  05/22/2014   CLINICAL DATA:  Abdominal pain, vomiting, fever since yesterday. Fall tonight.  EXAM: ACUTE ABDOMEN SERIES (ABDOMEN 2 VIEW & CHEST 1 VIEW)  COMPARISON:  Chest radiograph 10/09/2012  FINDINGS: The  cardiomediastinal contours are normal. The lungs are hyperinflated but clear. There is no free intra-abdominal air. No dilated bowel loops to suggest obstruction. Small volume of stool throughout the colon. No radiopaque calculi. No acute osseous abnormalities are seen.  IMPRESSION: Normal bowel gas pattern. No free air. Hyperinflated but clear lungs.   Electronically Signed   By: Rubye Oaks M.D.   On: 05/22/2014 04:39    Assessment/Plan Principal Problem:   Acute GI bleeding Active Problems:   PANCREATITIS   Acute blood loss anemia   History of CVA (cerebrovascular accident)   Hyperlipidemia  1. Acute GI bleed - upper GI bleed given her hematemesis and melanotic stools. At this time we will keep patient nothing by mouth and continue with Protonix infusion. Recheck CBC and there is further drop in hemoglobin less than 7 or if patient gets hypotensive we will transfuse. Continue hydration and Dr. Marina Goodell on call neurologist was consulted by the ER physician. 2. Acute blood loss anemia - follow CBC see #1. 3. History of CVA - presently holding off aspirin and Plavix secondary to #1. 4. History of hyperlipidemia - continue statins with patient getting orally. 5. History of alcoholism patient states she has not been drinking as much as before last drink was last week. 6. Tobacco abuse - patient advised to tobacco abuse. 7. History of chronic pancreatitis denies any abdominal pain at this time.   DVT Prophylaxis SCDs.  Code Status: Full code.  Family Communication: None.  Disposition Plan: Admit to patient.    KAKRAKANDY,ARSHAD N. [facility] Pager [phone].  If 7PM-7AM, please contact night-coverage [email or web address] Password [email or web address] 05/22/2014, 7:12 AM

## 2014-05-22 NOTE — ED Provider Notes (Signed)
This chart was scribed for Alexa MawKristen N Alesha Jaffee, DO by Bronson CurbJacqueline Melvin, ED Scribe. This patient was seen in room A01C/A01C and the patient's care was started at 2:44 AM.  TIME SEEN: 0244  CHIEF COMPLAINT: Fall  HPI:   HPI Comments: [name] is a 59 y.o. female, with history of stroke, EtOH abuse, brought in by ambulance, who presents to the Emergency Department complaining of fall that occurred approximately 1.5 hours ago.  Patient's speech is slurred and reports she has been feeling light-headed and dizzy all day. Per nursing note, patient was found on the floor beside bed by her family today. She is unable to tell us why she fell. She reports drinking a 1/2 can of beer and notes 2 episodes of vomiting prior to fall. She is complaining of generalized body aches. She denies illicit drug use. Denies fever. Denies diarrhea.   Most of the history is obtained from patient's niece [name] [phone]).  She lives with the patient. She reports the patient has a history of schizophrenia, seizures, prior stroke and is normally not able to give a good history and normally has slurred speech. She reports the patient has had 2-3 episodes of black emesis. She states that this evening they heard her moaning from her room and they went found her on the floor. States there was black appearing blood on the floor but they were not sure if she vomited this or if it came from her nose. They're not sure if she is on anticoagulation but it does appear from her home medications listed in our system that she is on Plavix. They deny that she drinks every day. States she only drinks twice a month. Denied history of prior GI bleed. She does not have a gastroenterologist.   PCP is [facility]  ROS: Level V caveat for altered mental status  PAST MEDICAL HISTORY/PAST SURGICAL HISTORY:  Past Medical History  Diagnosis Date  . Chronic back pain   . MDD (major depressive disorder)   . Alcohol abuse   . PTSD  (post-traumatic stress disorder)   . UTI (lower urinary tract infection)   . Colitis     MEDICATIONS:  Prior to Admission medications   Medication Sig Start Date End Date Taking? Authorizing Provider  aspirin EC 81 MG EC tablet Take 1 tablet (81 mg total) by mouth daily. 10/10/12   Ripudeep Jenna LuoK Rai, MD  atorvastatin (LIPITOR) 20 MG tablet Take 1 tablet (20 mg total) by mouth daily. 10/10/12   Ripudeep Jenna LuoK Rai, MD  cephALEXin (KEFLEX) 500 MG capsule Take 1 capsule (500 mg total) by mouth 3 (three) times daily. 03/06/14   Mathis FareJennifer Lee H Presson, PA  ciprofloxacin (CIPRO) 500 MG tablet Take 1 tablet (500 mg total) by mouth 2 (two) times daily. X 7 days 10/10/12   Ripudeep Jenna LuoK Rai, MD  clopidogrel (PLAVIX) 75 MG tablet Take 1 tablet (75 mg total) by mouth daily with breakfast. 10/10/12   Ripudeep Jenna LuoK Rai, MD  cyclobenzaprine (FLEXERIL) 5 MG tablet Take 1 tablet (5 mg total) by mouth 3 (three) times daily as needed for muscle spasms. 03/26/12   Earley FavorGail Schulz, NP  nicotine (NICODERM CQ) 21 mg/24hr patch Place 1 patch onto the skin daily. 10/10/12   Ripudeep Jenna LuoK Rai, MD  oxyCODONE-acetaminophen (PERCOCET/ROXICET) 5-325 MG per tablet Take 1 tablet by mouth every 4 (four) hours as needed. For pain    Historical Provider, MD    ALLERGIES:  Allergies  Allergen Reactions  . Ibuprofen  Unknown  . Penicillins     Unknown    SOCIAL HISTORY:  History  Substance Use Topics  . Smoking status: Current Every Day Smoker -- 0.50 packs/day    Types: Cigarettes  . Smokeless tobacco: Not on file  . Alcohol Use: No    FAMILY HISTORY: History reviewed. No pertinent family history.  EXAM:  Triage Vitals: BP 93/61 mmHg  Pulse 127  Temp(Src) 98.4 F (36.9 C) (Oral)  Resp 28  Ht  (1.626 m)  Wt 135 lb (61.236 kg)  BMI 23.16 kg/m2  SpO2 100%  CONSTITUTIONAL: Alert and oriented and responds appropriately to questions. Well-appearing; well-nourished; GCS 15. Smells of EtOH; slurred speech.  HEAD:  Normocephalic; atraumatic EYES: Conjunctivae clear, PERRL, EOMI ENT: normal nose; no rhinorrhea; moist mucous membranes; pharynx without lesions noted; no dental injury; no septal hematoma. Dried blood on mouth and lips. No obvious injury, tongue laceration, or bleeding in posterior oropharynx. NECK: Supple, no meningismus, no LAD; no midline spinal tenderness, step-off or deformity CARD: Regular and Tachycardic; S1 and S2 appreciated; no murmurs, no clicks, no rubs, no gallops. RESP: Normal chest excursion without splinting or tachypnea; breath sounds clear and equal bilaterally; no wheezes, no rhonchi, no rales; chest wall stable, nontender to palpation ABD/GI: Normal bowel sounds; non-distended; soft, non-tender, no rebound, no guarding RECTAL:  No gross blood but the patient does have black and tarry appearing stools or melena rectal tone PELVIS:  stable, nontender to palpation BACK:  The back appears normal and is non-tender to palpation, there is no CVA tenderness; no midline spinal tenderness, step-off or deformity EXT: Normal ROM in all joints; non-tender to palpation; no edema; normal capillary refill; no cyanosis    SKIN: Normal color for age and race; warm NEURO: Moves all extremities equally; no facial droop, mildly slurred speech which family reports is chronic PSYCH: The patient's mood and manner are appropriate. Grooming and personal hygiene are appropriate.  MEDICAL DECISION MAKING: Patient here with generalized weakness, fall out of bed and possible hematemesis. She is tachycardic and was initially mildly hypotensive which is improving with IV fluids. Benign abdominal exam. No obvious sign of trauma on exam but will obtain a CT of her head and cervical spine. Also obtain labs, urine. Will give IV fluids, Protonix.  ED PROGRESS: 4:00 AM  Pt now more awake and able to answer questions. Heart rate has improved and is in the low 100s. Blood pressure 107/69. She has received her second  liter of IV fluids. Reports that she drinks several times a month. Has had melena for 3 days and then coffee-ground emesis 2 today. Reports having a GI bleed in the 1980s but cannot tell me what it was from and does not think she had esophageal varices. Does not have a gastroenterologist here. Has not had endoscopy or colonoscopy since her prior GI bleed. She is on Plavix.  Phlebotomy has obtain blood. She had one peripheral IV placed by EMS. I have placed a 20-gauge in her left upper arm using ultrasound.   5:00 AM  Pt's hemoglobin is 9.9. Was 13 in 2014. She has not had any hematemesis in the emergency department and has not had any melena. Blood pressure is 125/64. We'll consult GI on call and admit to medicine.  Coags normal. Patient is on aspirin and Plavix. Unable to tell me when she last took these medications.    5:20 AM  D/w Dr. Marina Goodell who agrees with IV fluids, Protonix drip. Agrees with  medicine admission.   6:15 AM  D/w Dr. Toniann Fail for admission to stepdown.     EKG Interpretation  Date/Time:  Thursday May 22 2014 03:02:39 EST Ventricular Rate:  109 PR Interval:  102 QRS Duration: 77 QT Interval:  377 QTC Calculation: 508 R Axis:   54 Text Interpretation:  Sinus tachycardia Biatrial enlargement Borderline prolonged QT interval Confirmed by Jeannemarie Sawaya,  DO, Catelin Manthe ([id number]) on 05/22/2014 3:06:19 AM         CRITICAL CARE Performed by: Raelyn Number   Total critical care time: 45 minutes  Critical care time was exclusive of separately billable procedures and treating other patients.  Critical care was necessary to treat or prevent imminent or life-threatening deterioration.  Critical care was time spent personally by me on the following activities: development of treatment plan with patient and/or surrogate as well as nursing, discussions with consultants, evaluation of patient's response to treatment, examination of patient, obtaining history from patient or  surrogate, ordering and performing treatments and interventions, ordering and review of laboratory studies, ordering and review of radiographic studies, pulse oximetry and re-evaluation of patient's condition.    Angiocath insertion Performed by: Raelyn Number  Consent: Verbal consent obtained. Risks and benefits: risks, benefits and alternatives were discussed Time out: Immediately prior to procedure a "time out" was called to verify the correct patient, procedure, equipment, support staff and site/side marked as required.  Preparation: Patient was prepped and draped in the usual sterile fashion.  Vein Location: L brachicephalic  Ultrasound Guided  Gauge: 20  Normal blood return and flush without difficulty Patient tolerance: Patient tolerated the procedure well with no immediate complications.       I personally performed the services described in this documentation, which was scribed in my presence. The recorded information has been reviewed and is accurate.     [name] Casi Westerfeld, DO 05/22/14 [phone]

## 2014-05-22 NOTE — Consult Note (Signed)
[facility] Consult: 9:11 AM 05/22/2014  LOS: 0 days    Referring Provider: Dr Toniann Fail.   Primary Care Physician:  Lonia Blood, MD Primary Gastroenterologist:  Dr. Christella Hartigan, not seen since 2008    Reason for Consultation:  Dark emesis, FOBT +.     HPI: [name] is a 59 y.o. female. Hx polysubstance abuse, alcoholism, chronic pancreatitis with dilated CBD and PD on CT and EUS in 2008. Chronic abdominal pain. Esophagogastritis in 2008.  Hepatitis C + in 09/2012.  Fatty liver on [place] 02/2010 and CT of 05/2010. Depression. CVA 2014.  Chronic Plavix. Thrombocytopenia.   Presented to ED with hematemesis, started 3/8. Last Friday and Saturday had formed, black stools.  No BMs since but is FOBT + in ED.  The emesis was dark, not BRB, occurred x 3 at home, none since arrival to ED at 0200 today.  Normally has [employer] BM daily.   Hgb 9.9. Previously 13.3 in 09/2012.  INR normal. BUN 50 with normal creatinine.  She is legally blind and meds are set up in a box for her to take.  No PPI on med list.  C/o intermittent solid food dysphagia.  Drinks occasionally, sometimes every few weeks.  Last was a 24 ox beer on 3/6.  Appetite fair.  Weight fluctuateswithin 5 # range.  Abdominal girth increasing over last few months but no LE edema.  Some positional dizziness in last several days.  No NSAIDs.  Takes percocet for chronic abdominal and back pain, 3 x daily.   Ambulation limited, most mobility is with electric chair.       Past Medical History  Diagnosis Date  . Chronic back pain   . MDD (major depressive disorder)   . Alcohol abuse   . PTSD (post-traumatic stress disorder)   . UTI (lower urinary tract infection)   . Colitis   . Stroke   . Pancreatitis     Past Surgical History  Procedure Laterality Date  .  Esophagogastroduodenoscopy endoscopy      Prior to Admission medications   Medication Sig Start Date End Date Taking? Authorizing Provider  aspirin EC 81 MG EC tablet Take 1 tablet (81 mg total) by mouth daily. 10/10/12   Ripudeep Jenna Luo, MD  atorvastatin (LIPITOR) 20 MG tablet Take 1 tablet (20 mg total) by mouth daily. 10/10/12   Ripudeep Jenna Luo, MD  cephALEXin (KEFLEX) 500 MG capsule Take 1 capsule (500 mg total) by mouth 3 (three) times daily. 03/06/14   Mathis Fare Presson, PA  ciprofloxacin (CIPRO) 500 MG tablet Take 1 tablet (500 mg total) by mouth 2 (two) times daily. X 7 days 10/10/12   Ripudeep Jenna Luo, MD  clopidogrel (PLAVIX) 75 MG tablet Take 1 tablet (75 mg total) by mouth daily with breakfast. 10/10/12   Ripudeep Jenna Luo, MD  cyclobenzaprine (FLEXERIL) 5 MG tablet Take 1 tablet (5 mg total) by mouth 3 (three) times daily as needed for muscle spasms. 03/26/12   Earley Favor, NP  nicotine (NICODERM CQ) 21 mg/24hr patch Place 1 patch  onto the skin daily. 10/10/12   Ripudeep Jenna LuoK Rai, MD  oxyCODONE-acetaminophen (PERCOCET/ROXICET) 5-325 MG per tablet Take 1 tablet by mouth every 4 (four) hours as needed. For pain    Historical Provider, MD    Scheduled Meds: . [START ON 05/25/2014] pantoprazole (PROTONIX) IV  40 mg Intravenous Q12H   Infusions: . sodium chloride 125 mL/hr at 05/22/14 0448  . pantoprozole (PROTONIX) infusion 8 mg/hr (05/22/14 0452)  . potassium chloride     PRN Meds: acetaminophen **OR** acetaminophen, morphine injection   Allergies as of 05/22/2014 - Review Complete 05/22/2014  Allergen Reaction Noted  . Banana Other (See Comments) 05/22/2014  . Ibuprofen Other (See Comments)   . Penicillins Other (See Comments)     History reviewed. No pertinent family history.  History   Social History  . Marital Status: Widowed    Spouse Name: N/A  . Number of Children: N/A  . Years of Education: N/A   Occupational History  . Not on file.   Social History Main Topics    . Smoking status: Current Every Day Smoker -- 0.50 packs/day    Types: Cigarettes  . Smokeless tobacco: Not on file  . Alcohol Use: Yes     Comment: twice a  month.  . Drug Use: No  . Sexual Activity: Not on file   Other Topics Concern  . Not on file   Social History Narrative    REVIEW OF SYSTEMS: Constitutional:  Per HPI ENT:  No nose bleeds Pulm:  + DOE, no cough CV:  No palpitations, no LE edema.  GU:  No hematuria, no frequency GI:  Per HPI Heme:  No recent use of iron supplements   Transfusions:  Remotely, > 15 yrs ago.  Living in [place] at the time.  Neuro:  No headaches, no peripheral tingling or numbness Derm:  No itching, no rash or sores.  Endocrine:  No sweats or chills.  No polyuria or dysuria Immunization:  Not queried Travel:  None beyond local counties in last few months.    PHYSICAL EXAM: Vital signs in last 24 hours: Filed Vitals:   05/22/14 0815  BP: 108/58  Pulse: 96  Temp:   Resp: 18   Wt Readings from Last 3 Encounters:  05/22/14 135 lb (61.236 kg)  10/09/12 132 lb 8 oz (60.102 kg)  05/17/10 128 lb 6.1 oz (58.233 kg)   General: alert, looks chronically ill.  AAF Head:  No swelling or asymmetry  Eyes:  No icterus or pallor Ears:  Not HOH  Nose:  No congestion or discharge Mouth:  Partial plates, no lesions, moist MM Neck:  No mass or JVD Lungs:  Clear bil.  No SOB Heart: RRR.  No mrg.   Abdomen:  Soft, NT, ND, active BS.  No mass or HSM.   Rectal: deferred.  FOBT + in lab   Musc/Skeltl: no gross joint contracture or swelling Extremities:  No CCE  Neurologic:  Oriented x 3.  No tremor.  No asterixis.  Some give away weakness in left UE Skin:  No rash or sores.  No telangectasia Tattoos:  Yes, Nodes:  No cervical adenopathy  Psych:  Cooperative, pleasant, affect flat.   Intake/Output from previous day:   Intake/Output this shift:    LAB RESULTS:  Recent Labs  05/22/14 0409  WBC 12.6*  HGB 9.9*  HCT 29.0*  PLT 169  MCV      91  BMET Lab Results  Component Value Date  NA 144 05/22/2014   NA 141 10/08/2012   NA 137 03/26/2012   K 3.3* 05/22/2014   K 3.5 10/08/2012   K 4.7 03/26/2012   CL 114* 05/22/2014   CL 105 10/08/2012   CL 103 03/26/2012   CO2 22 05/22/2014   CO2 27 10/08/2012   CO2 18* 03/26/2012   GLUCOSE 96 05/22/2014   GLUCOSE 87 10/08/2012   GLUCOSE 77 03/26/2012   BUN 50* 05/22/2014   BUN 10 10/08/2012   BUN 7 03/26/2012   CREATININE 0.93 05/22/2014   CREATININE 0.76 10/08/2012   CREATININE 0.76 03/26/2012   CALCIUM 8.3* 05/22/2014   CALCIUM 10.2 10/08/2012   CALCIUM 9.8 03/26/2012   LFT  Recent Labs  05/22/14 0409  PROT 6.3  ALBUMIN 3.4*  AST 58*  ALT 72*  ALKPHOS 54  BILITOT 1.0   PT/INR Lab Results  Component Value Date   INR 1.25 05/22/2014   INR 1.09 10/09/2012   INR 1.4 11/16/2006   Hepatitis Panel No results for input(s): HEPBSAG, HCVAB, HEPAIGM, HEPBIGM in the last 72 hours. C-Diff No components found for: CDIFF Lipase     Component Value Date/Time   LIPASE 23 03/26/2012 2110    Drugs of Abuse     Component Value Date/Time   LABOPIA NONE DETECTED 05/22/2014 0451   COCAINSCRNUR NONE DETECTED 05/22/2014 0451   LABBENZ NONE DETECTED 05/22/2014 0451   AMPHETMU NONE DETECTED 05/22/2014 0451   THCU NONE DETECTED 05/22/2014 0451   LABBARB NONE DETECTED 05/22/2014 0451     RADIOLOGY STUDIES: Ct Head Wo Contrast  05/22/2014   CLINICAL DATA:  On unwitnessed fall.  Dizziness and lightheaded.  EXAM: CT HEAD WITHOUT CONTRAST  CT CERVICAL SPINE WITHOUT CONTRAST  TECHNIQUE: Multidetector CT imaging of the head and cervical spine was performed following the standard protocol without intravenous contrast. Multiplanar CT image reconstructions of the cervical spine were also generated.  COMPARISON:  Head CT and MRI 10/09/2012  FINDINGS: CT HEAD FINDINGS  No intracranial hemorrhage, mass effect, or midline shift. No hydrocephalus. The basilar cisterns are patent. No  evidence of territorial infarct. No intracranial fluid collection. Linear low attenuation in the right and left external capsule, unchanged. Calvarium is intact. Improved aeration of left maxillary sinus with mild residual mucosal thickening. The mastoid air cells are well aerated.  CT CERVICAL SPINE FINDINGS  Cervical spine alignment is maintained. Vertebral body heights are preserved. There is no fracture. The dens is intact. There are no jumped or perched facets. There is disc space narrowing at C5-C6. Mild endplate spurring from C4-C5 through C6-C7. No prevertebral soft tissue edema. Tiny focus of atelectasis at the right lung apex.  IMPRESSION: 1.  No acute intracranial abnormality. 2. No fracture or subluxation of the cervical spine.   Electronically Signed   By: Rubye Oaks M.D.   On: 05/22/2014 03:58   Ct Cervical Spine Wo Contrast  05/22/2014   CLINICAL DATA:  On unwitnessed fall.  Dizziness and lightheaded.  EXAM: CT HEAD WITHOUT CONTRAST  CT CERVICAL SPINE WITHOUT CONTRAST  TECHNIQUE: Multidetector CT imaging of the head and cervical spine was performed following the standard protocol without intravenous contrast. Multiplanar CT image reconstructions of the cervical spine were also generated.  COMPARISON:  Head CT and MRI 10/09/2012  FINDINGS: CT HEAD FINDINGS  No intracranial hemorrhage, mass effect, or midline shift. No hydrocephalus. The basilar cisterns are patent. No evidence of territorial infarct. No intracranial fluid collection. Linear low attenuation in the right and  left external capsule, unchanged. Calvarium is intact. Improved aeration of left maxillary sinus with mild residual mucosal thickening. The mastoid air cells are well aerated.  CT CERVICAL SPINE FINDINGS  Cervical spine alignment is maintained. Vertebral body heights are preserved. There is no fracture. The dens is intact. There are no jumped or perched facets. There is disc space narrowing at C5-C6. Mild endplate spurring  from C4-C5 through C6-C7. No prevertebral soft tissue edema. Tiny focus of atelectasis at the right lung apex.  IMPRESSION: 1.  No acute intracranial abnormality. 2. No fracture or subluxation of the cervical spine.   Electronically Signed   By: Rubye Oaks M.D.   On: 05/22/2014 03:58   Dg Abd Acute W/chest  05/22/2014   CLINICAL DATA:  Abdominal pain, vomiting, fever since yesterday. Fall tonight.  EXAM: ACUTE ABDOMEN SERIES (ABDOMEN 2 VIEW & CHEST 1 VIEW)  COMPARISON:  Chest radiograph 10/09/2012  FINDINGS: The cardiomediastinal contours are normal. The lungs are hyperinflated but clear. There is no free intra-abdominal air. No dilated bowel loops to suggest obstruction. Small volume of stool throughout the colon. No radiopaque calculi. No acute osseous abnormalities are seen.  IMPRESSION: Normal bowel gas pattern. No free air. Hyperinflated but clear lungs.   Electronically Signed   By: Rubye Oaks M.D.   On: 05/22/2014 04:39    ENDOSCOPIC STUDIES: 10/2006  EUS for dilated PD/CBD.  Dr Christella Hartigan Dilated PD likely from mild, chronic pancreatitis.  CBD dilation:? Post chole finding vs scarring from pancreatitis  05/2006  EGD  For hematemesis.  Dr Viann Shove, erosive, grade B esophagitis, gastritis   IMPRESSION:   *  Hematemesis, melenic stool.  UGI bleed. 2008 EGD with erosive esophagitis, gastritis  *  Hep C + and previous imaging showing fatty liver in alcoholic with hx polysubstance abuse. Mild transaminitis but not in an alcoholic pattern.  R/o cirrhosis  *  Hx 2014 CVA.  On Plavix, 81 ASA  *    PLAN:     *  Will need EGD.  Given resolution of the emesis and recent Plavix on 3/9, prefer to delay this for 5 days if possible.   Ok to have clears. Keep PPI drip going for now.  CBC in AM.   *  As    Jennye Moccasin  05/22/2014, 9:11 AM Pager: [phone]

## 2014-05-22 NOTE — ED Notes (Signed)
Per EMS - pt was seen walking around in room around midnight. At 0105 pt family heard pt fall - went into room and pt was on floor beside bed; uncertain as to what caused her to fall. Pt reports feeling dizzy and lightheaded all day. ETOH at bedside (25oz bud light). Pt vomited after drinking, known to have legs give out when she is drinking. 10-20cc total blood loss with dried blood around nares upon EMS arrival. Pt c/o mid thoracic and leg pain. Good strength bilaterally in all extremities. Pt c/o nausea - given 4 of zofran. Pt has hx stroke and seizure. Initially no palpable BP and weak pulses; last BP 120/68, Sinus Tachy 124bpm, 99% on RA, CBG 123, 20G Left FA.

## 2014-05-22 NOTE — Progress Notes (Signed)
Patient's BP has dropped to 90/51 post 500 ml bolus. Complaining of severe abdominal pian. HGB = 7.7. Patient has had 3 black soft/loose stools upon arrival to unit. MD aware.

## 2014-05-22 NOTE — ED Notes (Signed)
Discussed plan of care with Dione Housekeeperawn Robinson, pt niece.

## 2014-05-22 NOTE — ED Notes (Signed)
Potassium paused for transport to [place]

## 2014-05-22 NOTE — ED Notes (Signed)
Pt family contact info given to EDP; EDP is now calling pt family to update/request more info.

## 2014-05-22 NOTE — ED Notes (Signed)
Pt hypotensive - started fluid bolus.

## 2014-05-22 NOTE — Progress Notes (Signed)
Patient admitted after midnight- please see H&P.  C/o leg pain, pulses intact. [name]

## 2014-05-22 NOTE — ED Notes (Signed)
Pt family member, [name] [phone]), is okay to contact w/ updates.

## 2014-05-22 NOTE — Plan of Care (Signed)
Problem: Altered GI Function ([place]-1.4) Goal: Nausea & vomiting resolved Outcome: Progressing Patient has remained free of nausea and vomiting throughout shift. Will continue to monitor.

## 2014-05-22 NOTE — Progress Notes (Addendum)
Patient's BP = 93/50. Dizziness and drowsy state of consciousness noted upon arrival to unit and continues. Patient alert and oriented. MD aware. 500 ml bolus given per MD telephone order at 1652. BP is now 114/60. Will continue to monitor.

## 2014-05-23 ENCOUNTER — Encounter (HOSPITAL_COMMUNITY)
Admission: EM | Disposition: A | Discharge status: Home / Self Care | Payer: Self-pay | Source: Home / Self Care | Attending: Internal Medicine

## 2014-05-23 ENCOUNTER — Inpatient Hospital Stay (HOSPITAL_COMMUNITY): Payer: Medicaid Other | Admitting: Anesthesiology

## 2014-05-23 ENCOUNTER — Encounter (HOSPITAL_COMMUNITY): Payer: Self-pay | Admitting: *Deleted

## 2014-05-23 DIAGNOSIS — K253 Acute gastric ulcer without hemorrhage or perforation: Secondary | ICD-10-CM | POA: Insufficient documentation

## 2014-05-23 DIAGNOSIS — F1020 Alcohol dependence, uncomplicated: Secondary | ICD-10-CM

## 2014-05-23 DIAGNOSIS — G894 Chronic pain syndrome: Secondary | ICD-10-CM

## 2014-05-23 HISTORY — PX: ESOPHAGOGASTRODUODENOSCOPY: SHX5428

## 2014-05-23 LAB — BASIC METABOLIC PANEL
ANION GAP: 7 (ref 5–15)
BUN: 13 mg/dL (ref 6–23)
CALCIUM: 8 mg/dL — AB (ref 8.4–10.5)
CO2: 20 mmol/L (ref 19–32)
Chloride: 120 mmol/L — ABNORMAL HIGH (ref 96–112)
Creatinine, Ser: 0.85 mg/dL (ref 0.50–1.10)
GFR calc Af Amer: 85 mL/min — ABNORMAL LOW (ref >90)
GFR calc non Af Amer: 73 mL/min — ABNORMAL LOW (ref >90)
Glucose, Bld: 82 mg/dL (ref 70–99)
Potassium: 3.8 mmol/L (ref 3.5–5.1)
Sodium: 147 mmol/L — ABNORMAL HIGH (ref 135–145)

## 2014-05-23 LAB — CBC
HEMATOCRIT: 26 % — AB (ref 36.0–46.0)
Hemoglobin: 8.6 g/dL — ABNORMAL LOW (ref 12.0–15.0)
MCH: 30.7 pg (ref 26.0–34.0)
MCHC: 33.1 g/dL (ref 30.0–36.0)
MCV: 92.9 fL (ref 78.0–100.0)
Platelets: 115 10*3/uL — ABNORMAL LOW (ref 150–400)
RBC: 2.8 MIL/uL — ABNORMAL LOW (ref 3.87–5.11)
RDW: 14.8 % (ref 11.5–15.5)
WBC: 8.1 10*3/uL (ref 4.0–10.5)

## 2014-05-23 LAB — GLUCOSE, CAPILLARY: Glucose-Capillary: 83 mg/dL (ref 70–99)

## 2014-05-23 LAB — TYPE AND SCREEN
ABO/RH(D): B NEG
Antibody Screen: NEGATIVE
Unit division: 0

## 2014-05-23 LAB — HEMOGLOBIN AND HEMATOCRIT, BLOOD
HCT: 24.9 % — ABNORMAL LOW (ref 36.0–46.0)
HEMOGLOBIN: 8.4 g/dL — AB (ref 12.0–15.0)

## 2014-05-23 SURGERY — EGD (ESOPHAGOGASTRODUODENOSCOPY)
Anesthesia: Monitor Anesthesia Care

## 2014-05-23 MED ORDER — ROPINIROLE HCL 0.5 MG PO TABS
0.2500 mg | ORAL_TABLET | Freq: Three times a day (TID) | ORAL | Status: DC
  Administered 2014-05-23: 0.25 mg via ORAL
  Filled 2014-05-23 (×2): qty 0.5

## 2014-05-23 MED ORDER — OXYCODONE HCL 5 MG PO TABS
2.5000 mg | ORAL_TABLET | ORAL | Status: DC | PRN
  Administered 2014-05-23 – 2014-05-24 (×3): 2.5 mg via ORAL
  Filled 2014-05-23 (×3): qty 1

## 2014-05-23 MED ORDER — SERTRALINE HCL 50 MG PO TABS
50.0000 mg | ORAL_TABLET | Freq: Every day | ORAL | Status: DC
  Administered 2014-05-23 – 2014-05-24 (×2): 50 mg via ORAL
  Filled 2014-05-23 (×2): qty 1

## 2014-05-23 MED ORDER — LORAZEPAM 2 MG/ML IJ SOLN: 1.0000 mg | Freq: Four times a day (QID) | INTRAMUSCULAR | Status: DC | PRN

## 2014-05-23 MED ORDER — ALBUTEROL SULFATE (2.5 MG/3ML) 0.083% IN NEBU: 2.5000 mg | INHALATION_SOLUTION | Freq: Three times a day (TID) | RESPIRATORY_TRACT | Status: DC | PRN

## 2014-05-23 MED ORDER — CYCLOBENZAPRINE HCL 10 MG PO TABS
10.0000 mg | ORAL_TABLET | Freq: Two times a day (BID) | ORAL | Status: DC
  Administered 2014-05-23 (×2): 10 mg via ORAL
  Filled 2014-05-23 (×2): qty 1

## 2014-05-23 MED ORDER — LORAZEPAM 1 MG PO TABS: 1.0000 mg | ORAL_TABLET | Freq: Four times a day (QID) | ORAL | Status: DC | PRN

## 2014-05-23 MED ORDER — LORATADINE 10 MG PO TABS
10.0000 mg | ORAL_TABLET | Freq: Every day | ORAL | Status: DC
  Administered 2014-05-23 – 2014-05-24 (×2): 10 mg via ORAL
  Filled 2014-05-23 (×2): qty 1

## 2014-05-23 MED ORDER — PROPOFOL INFUSION 10 MG/ML OPTIME
INTRAVENOUS | Status: DC | PRN
  Administered 2014-05-23: 200 ug/kg/min via INTRAVENOUS

## 2014-05-23 MED ORDER — PROPOFOL 10 MG/ML IV BOLUS
INTRAVENOUS | Status: DC | PRN
  Administered 2014-05-23 (×2): 18 mg via INTRAVENOUS
  Administered 2014-05-23: 12 mg via INTRAVENOUS
  Administered 2014-05-23 (×2): 18 mg via INTRAVENOUS

## 2014-05-23 MED ORDER — FOLIC ACID 1 MG PO TABS
1.0000 mg | ORAL_TABLET | Freq: Every day | ORAL | Status: DC
  Administered 2014-05-24: 1 mg via ORAL
  Filled 2014-05-23: qty 1

## 2014-05-23 MED ORDER — CYCLOSPORINE 0.05 % OP EMUL
1.0000 [drp] | Freq: Two times a day (BID) | OPHTHALMIC | Status: DC
  Administered 2014-05-23 – 2014-05-24 (×2): 1 [drp] via OPHTHALMIC
  Filled 2014-05-23 (×4): qty 1

## 2014-05-23 MED ORDER — TEMAZEPAM 15 MG PO CAPS
15.0000 mg | ORAL_CAPSULE | Freq: Every day | ORAL | Status: DC
  Administered 2014-05-23: 15 mg via ORAL
  Filled 2014-05-23 (×2): qty 1

## 2014-05-23 MED ORDER — THIAMINE HCL 100 MG/ML IJ SOLN: 100.0000 mg | Freq: Every day | INTRAMUSCULAR | Status: DC

## 2014-05-23 MED ORDER — ADULT MULTIVITAMIN W/MINERALS CH
1.0000 | ORAL_TABLET | Freq: Every day | ORAL | Status: DC
Start: 2014-05-23 — End: 2014-05-24
  Administered 2014-05-24: 1 via ORAL
  Filled 2014-05-23: qty 1

## 2014-05-23 MED ORDER — OXYCODONE-ACETAMINOPHEN 7.5-325 MG PO TABS: 1.0000 | ORAL_TABLET | ORAL | Status: DC | PRN

## 2014-05-23 MED ORDER — OXYCODONE-ACETAMINOPHEN 5-325 MG PO TABS
1.0000 | ORAL_TABLET | ORAL | Status: DC | PRN
  Administered 2014-05-23 – 2014-05-24 (×3): 1 via ORAL
  Filled 2014-05-23 (×3): qty 1

## 2014-05-23 MED ORDER — GABAPENTIN 300 MG PO CAPS
300.0000 mg | ORAL_CAPSULE | Freq: Two times a day (BID) | ORAL | Status: DC
  Administered 2014-05-23 – 2014-05-24 (×3): 300 mg via ORAL
  Filled 2014-05-23 (×3): qty 1

## 2014-05-23 MED ORDER — POTASSIUM CHLORIDE IN NACL 20-0.45 MEQ/L-% IV SOLN
INTRAVENOUS | Status: DC
  Administered 2014-05-23: 13:00:00 via INTRAVENOUS
  Filled 2014-05-23 (×2): qty 1000

## 2014-05-23 MED ORDER — LACTATED RINGERS IV SOLN
INTRAVENOUS | Status: DC | PRN
  Administered 2014-05-23: 12:00:00 via INTRAVENOUS

## 2014-05-23 MED ORDER — VITAMIN B-1 100 MG PO TABS
100.0000 mg | ORAL_TABLET | Freq: Every day | ORAL | Status: DC
  Administered 2014-05-24: 100 mg via ORAL
  Filled 2014-05-23: qty 1

## 2014-05-23 MED ORDER — PANTOPRAZOLE SODIUM 40 MG PO TBEC
40.0000 mg | DELAYED_RELEASE_TABLET | Freq: Two times a day (BID) | ORAL | Status: DC
  Administered 2014-05-23 – 2014-05-24 (×2): 40 mg via ORAL
  Filled 2014-05-23 (×4): qty 1

## 2014-05-23 MED ORDER — SODIUM CHLORIDE 0.9 % IV SOLN: INTRAVENOUS | Status: DC

## 2014-05-23 MED ORDER — TRAMADOL HCL 50 MG PO TABS
50.0000 mg | ORAL_TABLET | Freq: Four times a day (QID) | ORAL | Status: DC | PRN
  Administered 2014-05-23: 50 mg via ORAL
  Filled 2014-05-23: qty 1

## 2014-05-23 MED ORDER — INFLUENZA VAC SPLIT QUAD 0.5 ML IM SUSY: 0.5000 mL | PREFILLED_SYRINGE | INTRAMUSCULAR | Status: DC | PRN

## 2014-05-23 NOTE — Op Note (Signed)
[facility] [address] [place], [id number]   ENDOSCOPY PROCEDURE REPORT  PATIENT: [name], [name]  MR#: [id number] BIRTHDATE: 07-30-1955 , 58  yrs. old GENDER: female ENDOSCOPIST: Beverley FiedlerJay M Kendry Pfarr, MD REFERRED BY:  [facility] PROCEDURE DATE:  05/23/2014 PROCEDURE:  EGD w/ biopsy ASA CLASS:     Class III INDICATIONS:  hematemesis. MEDICATIONS: Monitored anesthesia care and Per Anesthesia TOPICAL ANESTHETIC: Cetacaine Spray  DESCRIPTION OF PROCEDURE: After the risks benefits and alternatives of the procedure were thoroughly explained, informed consent was obtained.  The Pentax Gastroscope [id number] endoscope was introduced through the mouth and advanced to the second portion of the duodenum , Without limitations.  The instrument was slowly withdrawn as the mucosa was fully examined.  ESOPHAGUS: The mucosa of the esophagus appeared normal.  Partial, nonobstructing Schatzki's ring. No esophageal varices seen  STOMACH: Two small but deep, clean-based ulcers were found in the gastric antrum. There was considerable surrounding gastritis, edema with erythema.  Biopsies were taken around the ulcers and from the incisura to exclude H. pylori and dysplasia.  DUODENUM: Mild duodenal inflammation was found in the duodenal bulb. Normal examined second portion of the duodenum.  Retroflexed views revealed no abnormalities.     The scope was then withdrawn from the patient and the procedure completed.  COMPLICATIONS: There were no immediate complications.  ENDOSCOPIC IMPRESSION: 1.   The mucosa of the esophagus appeared normal 2.   Two small, though deep ulcers were found in the gastric antrum (likely source of recent hematemesis); biopsies 3.   Duodenal inflammation was found in the duodenal bulb, normal examined second part of duodenum  RECOMMENDATIONS: 1.  Await biopsy results 2.  Follow-up of helicobacter pylori status, treat if indicated 3.  BID PPI  for at least 8 weeks 4.  No NSAIDs 5.  Would hold Plavix for 7 days given gastric ulcer with recent bleeding, defer to prescribing MD on restarting Plavix versus change to daily aspirin 6.  Outpatient screening colonoscopy recommended based on age  eSigned:  Beverley FiedlerJay M Nykeria Mealing, MD 05/23/2014 12:27 PM      CC: the patient

## 2014-05-23 NOTE — Transfer of Care (Signed)
Immediate Anesthesia Transfer of Care Note  Patient: [name]  Procedure(s) Performed: Procedure(s): ESOPHAGOGASTRODUODENOSCOPY (EGD) (N/A)  Patient Location: Endoscopy Unit  Anesthesia Type:MAC  Level of Consciousness: awake  Airway & Oxygen Therapy: Patient Spontanous Breathing and Patient connected to nasal cannula oxygen  Post-op Assessment: Report given to RN, Post -op Vital signs reviewed and stable and Patient moving all extremities  Post vital signs: Reviewed and stable  Last Vitals:  Filed Vitals:   05/23/14 1054  BP: 121/69  Pulse: 84  Temp: 37.2 C  Resp: 20    Complications: No apparent anesthesia complications

## 2014-05-23 NOTE — Progress Notes (Signed)
Pt used BSC, had a walnut sized BM that was soft and black

## 2014-05-23 NOTE — Progress Notes (Addendum)
PROGRESS NOTE  [name] ZOX:[id number] DOB: [birth year] DOA: 05/22/2014 PCP: Lonia Blood, MD  Assessment/Plan: Acute GI bleed - upper GI bleed given her hematemesis and melanotic stools. At this time we will keep patient nothing by mouth and continue with Protonix infusion.  -transfuse x 1 unit 3/10 -EGD 3/11  Acute blood loss anemia - follow CBC see #1.  History of CVA - presently holding off aspirin and Plavix secondary to #1. -will need to follow up with Dr. Pearlean Brownie upon d/c  History of hyperlipidemia - continue statins with patient getting orally.  History of alcoholism patient states she has not been drinking as much as before last drink was last week.  Tobacco abuse - patient advised to tobacco abuse.  History of chronic pancreatitis denies any abdominal pain at this time.  Daughter reports patient is abusing narcotics, would like to consider rehab  Thrombocytopenia -monitor  Code Status: full Family Communication: patient Disposition Plan:   Consultants:  GI  Procedures:      HPI/Subjective: C/o b/l leg pain- say from her fibromyalgia  Objective: Filed Vitals:   05/23/14 0700  BP:   Pulse: 85  Temp:   Resp: 20    Intake/Output Summary (Last 24 hours) at 05/23/14 0821 Last data filed at 05/23/14 0813  Gross per 24 hour  Intake 1128.33 ml  Output    200 ml  Net 928.33 ml   Filed Weights   05/22/14 0217 05/22/14 1413 05/23/14 0430  Weight: 61.236 kg (135 lb) 61.508 kg (135 lb 9.6 oz) 60.419 kg (133 lb 3.2 oz)    Exam:   General:  A+Ox3, NAD  Cardiovascular:rrr  Respiratory: clear  Abdomen: +BS, soft  Musculoskeletal: no edema   Data Reviewed: Basic Metabolic Panel:  Recent Labs Lab 05/22/14 0409 05/22/14 1251 05/22/14 1300 05/23/14 0320  NA 144 143  --  147*  K 3.3* 3.6  --  3.8  CL 114* 119*  --  120*  CO2 22 20  --  20  GLUCOSE 96 78  --  82  BUN 50* 30*  --  13  CREATININE 0.93 0.90  --  0.85  CALCIUM 8.3* 8.1*  --   8.0*  MG  --   --  1.8  --    Liver Function Tests:  Recent Labs Lab 05/22/14 0409 05/22/14 1251  AST 58* 54*  ALT 72* 63*  ALKPHOS 54 54  BILITOT 1.0 1.0  PROT 6.3 6.4  ALBUMIN 3.4* 3.6   No results for input(s): LIPASE, AMYLASE in the last 168 hours. No results for input(s): AMMONIA in the last 168 hours. CBC:  Recent Labs Lab 05/22/14 0409 05/22/14 1251 05/22/14 1644 05/22/14 2005 05/23/14 0014 05/23/14 0320  WBC 12.6* 9.0 9.2 7.6  --  8.1  NEUTROABS 8.7*  --   --   --   --   --   HGB 9.9* 8.5* 7.7* 7.2* 8.4* 8.6*  HCT 29.0* 25.7* 22.7* 21.8* 24.9* 26.0*  MCV 91.8 92.4 92.7 92.0  --  92.9  PLT 169 154 130* 129*  --  115*   Cardiac Enzymes:  Recent Labs Lab 05/22/14 0409  TROPONINI <0.03   BNP (last 3 results) No results for input(s): BNP in the last 8760 hours.  ProBNP (last 3 results) No results for input(s): PROBNP in the last 8760 hours.  CBG:  Recent Labs Lab 05/23/14 0637  GLUCAP 83    No results found for this or any previous visit (from the past 240  hour(s)).   Studies: Ct Head Wo Contrast  05/22/2014   CLINICAL DATA:  On unwitnessed fall.  Dizziness and lightheaded.  EXAM: CT HEAD WITHOUT CONTRAST  CT CERVICAL SPINE WITHOUT CONTRAST  TECHNIQUE: Multidetector CT imaging of the head and cervical spine was performed following the standard protocol without intravenous contrast. Multiplanar CT image reconstructions of the cervical spine were also generated.  COMPARISON:  Head CT and MRI 10/09/2012  FINDINGS: CT HEAD FINDINGS  No intracranial hemorrhage, mass effect, or midline shift. No hydrocephalus. The basilar cisterns are patent. No evidence of territorial infarct. No intracranial fluid collection. Linear low attenuation in the right and left external capsule, unchanged. Calvarium is intact. Improved aeration of left maxillary sinus with mild residual mucosal thickening. The mastoid air cells are well aerated.  CT CERVICAL SPINE FINDINGS   Cervical spine alignment is maintained. Vertebral body heights are preserved. There is no fracture. The dens is intact. There are no jumped or perched facets. There is disc space narrowing at C5-C6. Mild endplate spurring from C4-C5 through C6-C7. No prevertebral soft tissue edema. Tiny focus of atelectasis at the right lung apex.  IMPRESSION: 1.  No acute intracranial abnormality. 2. No fracture or subluxation of the cervical spine.   Electronically Signed   By: Rubye Oaks M.D.   On: 05/22/2014 03:58   Ct Cervical Spine Wo Contrast  05/22/2014   CLINICAL DATA:  On unwitnessed fall.  Dizziness and lightheaded.  EXAM: CT HEAD WITHOUT CONTRAST  CT CERVICAL SPINE WITHOUT CONTRAST  TECHNIQUE: Multidetector CT imaging of the head and cervical spine was performed following the standard protocol without intravenous contrast. Multiplanar CT image reconstructions of the cervical spine were also generated.  COMPARISON:  Head CT and MRI 10/09/2012  FINDINGS: CT HEAD FINDINGS  No intracranial hemorrhage, mass effect, or midline shift. No hydrocephalus. The basilar cisterns are patent. No evidence of territorial infarct. No intracranial fluid collection. Linear low attenuation in the right and left external capsule, unchanged. Calvarium is intact. Improved aeration of left maxillary sinus with mild residual mucosal thickening. The mastoid air cells are well aerated.  CT CERVICAL SPINE FINDINGS  Cervical spine alignment is maintained. Vertebral body heights are preserved. There is no fracture. The dens is intact. There are no jumped or perched facets. There is disc space narrowing at C5-C6. Mild endplate spurring from C4-C5 through C6-C7. No prevertebral soft tissue edema. Tiny focus of atelectasis at the right lung apex.  IMPRESSION: 1.  No acute intracranial abnormality. 2. No fracture or subluxation of the cervical spine.   Electronically Signed   By: Rubye Oaks M.D.   On: 05/22/2014 03:58   US Abdomen  Complete  05/22/2014   CLINICAL DATA:  Evaluate for cirrhosis or fatty liver.  EXAM: ULTRASOUND ABDOMEN COMPLETE  COMPARISON:  CT 05/19/2010  FINDINGS: Gallbladder: Status post cholecystectomy.  Common bile duct: Diameter: 9.5 mm  Liver: No focal lesion identified. Within normal limits in parenchymal echogenicity.  IVC: No abnormality visualized.  Pancreas: Visualized portion unremarkable.  Spleen: Not well visualized due to overlying rib shadows.  Right Kidney: Length: 9.9 cm. Echogenicity within normal limits. No mass or hydronephrosis visualized.  Left Kidney: Length: 10.6 cm. Echogenicity within normal limits. No mass or hydronephrosis visualized.  Abdominal aorta: Distal aorta obscured by bowel gas. No aneurysm visualized.  Other findings: None.  IMPRESSION: Status post cholecystectomy. Common bile duct within normal limits status post cholecystectomy, measuring 9.5 mm.   Electronically Signed   By: Kenard Gower  Earlene Plateravis M.D.   On: 05/22/2014 12:25   Dg Abd Acute W/chest  05/22/2014   CLINICAL DATA:  Abdominal pain, vomiting, fever since yesterday. Fall tonight.  EXAM: ACUTE ABDOMEN SERIES (ABDOMEN 2 VIEW & CHEST 1 VIEW)  COMPARISON:  Chest radiograph 10/09/2012  FINDINGS: The cardiomediastinal contours are normal. The lungs are hyperinflated but clear. There is no free intra-abdominal air. No dilated bowel loops to suggest obstruction. Small volume of stool throughout the colon. No radiopaque calculi. No acute osseous abnormalities are seen.  IMPRESSION: Normal bowel gas pattern. No free air. Hyperinflated but clear lungs.   Electronically Signed   By: Rubye OaksMelanie  Ehinger M.D.   On: 05/22/2014 04:39    Scheduled Meds: . sodium chloride   Intravenous Once  . [START ON 05/25/2014] pantoprazole (PROTONIX) IV  40 mg Intravenous Q12H  . pneumococcal 23 valent vaccine  0.5 mL Intramuscular Tomorrow-1000   Continuous Infusions: . sodium chloride 100 mL/hr at 05/23/14 0437  . sodium chloride    . pantoprozole  (PROTONIX) infusion 8 mg/hr (05/23/14 0435)   Antibiotics Given (last 72 hours)    None      Principal Problem:   Acute GI bleeding Active Problems:   PANCREATITIS   Acute blood loss anemia   History of CVA (cerebrovascular accident)   Hyperlipidemia    Time spent: 25 min    Crislyn Willbanks  [facility] Pager [phone]. If 7PM-7AM, please contact night-coverage at [email or web address], password [email or web address] 05/23/2014, 8:21 AM  LOS: 1 day

## 2014-05-23 NOTE — Anesthesia Preprocedure Evaluation (Addendum)
Anesthesia Evaluation  Patient identified by MRN, date of birth, ID band Patient awake    Reviewed: Allergy & Precautions, NPO status , Patient's Chart, lab work & pertinent test results  Airway Mallampati: III  TM Distance: >3 FB Neck ROM: Full    Dental   Pulmonary asthma , Current Smoker,  breath sounds clear to auscultation        Cardiovascular hypertension, + Peripheral Vascular Disease Rhythm:Regular Rate:Normal     Neuro/Psych Depression CVA    GI/Hepatic GERD-  ,(+)     substance abuse  alcohol use,   Endo/Other  negative endocrine ROS  Renal/GU negative Renal ROS     Musculoskeletal   Abdominal   Peds  Hematology  (+) anemia ,   Anesthesia Other Findings   Reproductive/Obstetrics                            Anesthesia Physical Anesthesia Plan  ASA: III  Anesthesia Plan: MAC   Post-op Pain Management:    Induction: Intravenous  Airway Management Planned: Nasal Cannula and Natural Airway  Additional Equipment:   Intra-op Plan:   Post-operative Plan:   Informed Consent: I have reviewed the patients History and Physical, chart, labs and discussed the procedure including the risks, benefits and alternatives for the proposed anesthesia with the patient or authorized representative who has indicated his/her understanding and acceptance.     Plan Discussed with: CRNA  Anesthesia Plan Comments:         Anesthesia Quick Evaluation

## 2014-05-23 NOTE — Progress Notes (Signed)
Paged on call with H/H of 8.4/24.9 post 1 unit PRBC. No orders received yet. Planned EGD in am

## 2014-05-23 NOTE — Anesthesia Postprocedure Evaluation (Signed)
  Anesthesia Post-op Note  Patient: [name]  Procedure(s) Performed: Procedure(s): ESOPHAGOGASTRODUODENOSCOPY (EGD) (N/A)  Patient Location: PACU  Anesthesia Type:MAC  Level of Consciousness: awake and alert   Airway and Oxygen Therapy: Patient Spontanous Breathing  Post-op Pain: none  Post-op Assessment: Post-op Vital signs reviewed  Post-op Vital Signs: Reviewed  Last Vitals:  Filed Vitals:   05/23/14 1308  BP: 111/63  Pulse: 82  Temp: 36.9 C  Resp: 15    Complications: No apparent anesthesia complications

## 2014-05-23 NOTE — Care Management Note (Unsigned)
    Page 1 of 1   05/23/2014     2:08:28 PM CARE MANAGEMENT NOTE 05/23/2014  Patient:  [name],[name]   Account Number:  [id number]  Date Initiated:  05/23/2014  Documentation initiated by:  GRAVES-BIGELOW,Hernando Reali  Subjective/Objective Assessment:   Pt admitted for anemia, hypotension and tachycardia.     Action/Plan:   CM to continue to monitor for disposition needs.   Anticipated DC Date:  05/26/2014   Anticipated DC Plan:  HOME W HOME HEALTH SERVICES         Choice offered to / List presented to:             Status of service:  In process, will continue to follow Medicare Important Message given?  NO (If response is "NO", the following Medicare IM given date fields will be blank) Date Medicare IM given:   Medicare IM given by:   Date Additional Medicare IM given:   Additional Medicare IM given by:    Discharge Disposition:    Per UR Regulation:  Reviewed for med. necessity/level of care/duration of stay  If discussed at Long Length of Stay Meetings, dates discussed:    Comments:

## 2014-05-23 NOTE — Progress Notes (Signed)
CSW (Clinical Child psychotherapistocial Worker) visited pt room to discuss substance abuse. Pt denies any substance use and politely declined to answer all questions to complete SBIRT. Pt did report no drug use, seldom use of alcohol on special occassions, and that she uses narcotics as prescribed and does not abuse them. Pt declined need for assistance or resources but did accept resource list from CSW. At this time, pt has no further hospital social work needs. CSW signing off.   Jarrin Staley, LCSWA [phone]

## 2014-05-23 NOTE — Evaluation (Signed)
Physical Therapy Evaluation Patient Details Name: [name] MRN: [id number] DOB: Apr 02, 1955 Today's Date: 05/23/2014   History of Present Illness  59 yo female admitted for anemia, hypotension and tachycardia.  Clinical Impression  Patient demonstrates deficits in functional mobility as indicated below. Will need continued skilled PT to address deficits and maximize function. Will see as indicated and progress as tolerated. Recommend supervision for OOB mobility and use of cane.    Follow Up Recommendations Supervision for mobility/OOB    Equipment Recommendations  None recommended by PT    Recommendations for Other Services       Precautions / Restrictions Precautions Precautions: Fall      Mobility  Bed Mobility Overal bed mobility: Modified Independent             General bed mobility comments: increased time to perform  Transfers Overall transfer level: Needs assistance Equipment used: None Transfers: Sit to/from Stand;Stand Pivot Transfers Sit to Stand: Min guard Stand pivot transfers: Min assist       General transfer comment: min assist for stability to bedside commode  Ambulation/Gait Ambulation/Gait assistance: Min guard;Min assist Ambulation Distance (Feet): 130 Feet Assistive device: 1 person hand held assist Gait Pattern/deviations: Step-through pattern;Decreased stride length;Drifts right/left;Narrow base of support Gait velocity: decreased Gait velocity interpretation: Below normal speed for age/gender General Gait Details: VCs for upright posture, assist for stability  Stairs            Wheelchair Mobility    Modified Rankin (Stroke Patients Only)       Balance Overall balance assessment: History of Falls                                           Pertinent Vitals/Pain Pain Assessment: 0-10 Pain Score: 6  Pain Location: legs Pain Descriptors / Indicators: Aching Pain Intervention(s): Monitored during  session;Premedicated before session    Home Living Family/patient expects to be discharged to:: Private residence Living Arrangements: Other relatives (neice ) Available Help at Discharge: Family Type of Home: House Home Access: Stairs to enter   Secretary/administrator of Steps: 1 Home Layout: Two level;Bed/bath upstairs Home Equipment: Walker - 2 wheels;Cane - single point;Wheelchair - power      Prior Function Level of Independence: Needs assistance               Hand Dominance   Dominant Hand: Left    Extremity/Trunk Assessment   Upper Extremity Assessment: Generalized weakness           Lower Extremity Assessment: Generalized weakness         Communication      Cognition Arousal/Alertness: Awake/alert Behavior During Therapy: Flat affect Overall Cognitive Status: No family/caregiver present to determine baseline cognitive functioning                      General Comments      Exercises        Assessment/Plan    PT Assessment Patient needs continued PT services  PT Diagnosis Difficulty walking;Generalized weakness   PT Problem List Decreased strength;Decreased range of motion;Decreased activity tolerance;Decreased balance;Decreased mobility  PT Treatment Interventions DME instruction;Gait training;Stair training;Functional mobility training;Therapeutic activities;Therapeutic exercise;Balance training;Patient/family education   PT Goals (Current goals can be found in the Care Plan section) Acute Rehab PT Goals Patient Stated Goal: to go home PT Goal Formulation: With patient Time For  Goal Achievement: 05/30/14 Potential to Achieve Goals: Good    Frequency Min 3X/week   Barriers to discharge        Co-evaluation               End of Session Equipment Utilized During Treatment: Gait belt Activity Tolerance: Patient limited by fatigue Patient left: in bed;with call bell/phone within reach Nurse Communication: Mobility  status         Time: [id number] PT Time Calculation (min) (ACUTE ONLY): 20 min   Charges:   PT Evaluation $Initial PT Evaluation Tier I: 1 Procedure     PT G CodesFabio Asa:        Cameryn Schum J 05/23/2014, 4:46 PM  Charlotte Crumbevon Zahlia Deshazer, PT DPT  [phone]

## 2014-05-24 DIAGNOSIS — K253 Acute gastric ulcer without hemorrhage or perforation: Secondary | ICD-10-CM

## 2014-05-24 DIAGNOSIS — F1010 Alcohol abuse, uncomplicated: Secondary | ICD-10-CM

## 2014-05-24 LAB — BASIC METABOLIC PANEL
Anion gap: 4 — ABNORMAL LOW (ref 5–15)
BUN: 7 mg/dL (ref 6–23)
CALCIUM: 8.7 mg/dL (ref 8.4–10.5)
CHLORIDE: 116 mmol/L — AB (ref 96–112)
CO2: 24 mmol/L (ref 19–32)
CREATININE: 0.87 mg/dL (ref 0.50–1.10)
GFR calc Af Amer: 82 mL/min — ABNORMAL LOW (ref >90)
GFR, EST NON AFRICAN AMERICAN: 71 mL/min — AB (ref >90)
GLUCOSE: 113 mg/dL — AB (ref 70–99)
POTASSIUM: 3.7 mmol/L (ref 3.5–5.1)
Sodium: 144 mmol/L (ref 135–145)

## 2014-05-24 LAB — CBC
HEMATOCRIT: 26.8 % — AB (ref 36.0–46.0)
Hemoglobin: 9.1 g/dL — ABNORMAL LOW (ref 12.0–15.0)
MCH: 31 pg (ref 26.0–34.0)
MCHC: 34 g/dL (ref 30.0–36.0)
MCV: 91.2 fL (ref 78.0–100.0)
Platelets: 127 10*3/uL — ABNORMAL LOW (ref 150–400)
RBC: 2.94 MIL/uL — ABNORMAL LOW (ref 3.87–5.11)
RDW: 14.8 % (ref 11.5–15.5)
WBC: 6.8 10*3/uL (ref 4.0–10.5)

## 2014-05-24 MED ORDER — CYCLOBENZAPRINE HCL 5 MG PO TABS: 5.0000 mg | ORAL_TABLET | Freq: Two times a day (BID) | ORAL | Status: DC

## 2014-05-24 MED ORDER — PANTOPRAZOLE SODIUM 40 MG PO TBEC: 40.0000 mg | DELAYED_RELEASE_TABLET | Freq: Two times a day (BID) | ORAL | Status: DC

## 2014-05-24 MED ORDER — CYCLOBENZAPRINE HCL 10 MG PO TABS
5.0000 mg | ORAL_TABLET | Freq: Two times a day (BID) | ORAL | Status: DC
  Administered 2014-05-24: 5 mg via ORAL
  Filled 2014-05-24: qty 1

## 2014-05-24 MED ORDER — OXYMETAZOLINE HCL 0.05 % NA SOLN
1.0000 | Freq: Two times a day (BID) | NASAL | Status: DC
  Administered 2014-05-24: 1 via NASAL
  Filled 2014-05-24: qty 15

## 2014-05-24 NOTE — Discharge Summary (Signed)
Physician Discharge Summary  [name] ZOX:[id number] DOB: 06-18-1955 DOA: 05/22/2014  PCP: Lonia Blood, MD  Admit date: 05/22/2014 Discharge date: 05/24/2014  Time spent: 35 minutes  Recommendations for Outpatient Follow-up:  Await biopsy results Follow-up of helicobacter pylori status, treat if indicated BID PPI for at least 8 weeks  No NSAIDs or alcohol Would hold Plavix for 7 days given gastric ulcer with recent bleeding, defer to prescribing MD on restarting Plavix versus change to daily aspirin  Outpatient screening colonoscopy recommended based on age  Discharge Diagnoses:  Principal Problem:   Acute GI bleeding Active Problems:   PANCREATITIS   Acute blood loss anemia   History of CVA (cerebrovascular accident)   Hyperlipidemia   Acute gastric ulcer   Discharge Condition: improved  Diet recommendation: regular  Filed Weights   05/22/14 1413 05/23/14 0430 05/24/14 0517  Weight: 61.508 kg (135 lb 9.6 oz) 60.419 kg (133 lb 3.2 oz) 62.415 kg (137 lb 9.6 oz)    History of present illness:  [name] is a 59 y.o. female history of CVA, previous history of GI bleed has had endoscopy in 2008 which showed gastritis presents to the ER because of throwing up blood. Patient states that she threw up blood twice yesterday. She felt dizzy but did not lose consciousness. Denies any chest pain or abdominal pain. Patient states she has been noticing melanotic stools. Patient denies taking any NSAIDs. Patient is on Plavix and aspirin for her stroke. In the ER patient was found to be hypotensive and tachycardic and was given fluid boluses for language patient's blood pressure improved. On-call gastroenterologist Dr. Marina Goodell was consulted and will be seen in consult. Patient states she does not drink alcohol as previously and only once or twice a month.  Hospital Course:  Acute GI bleed - resolved.  -transfuse x 1 unit 3/10 -EGD 3/11:  Await biopsy results, Follow-up of helicobacter  pylori status, treat if indicated, BID PPI for at least 8 weeks, No NSAIDs, Would hold Plavix for 7 days given gastric ulcer with recent bleeding, defer to prescribing MD on restarting Plavix versus change to daily aspirin, Outpatient screening colonoscopy recommended based on age  Acute blood loss anemia - h/h stable, no further bleeding  History of CVA - presently holding off aspirin and Plavix secondary to #1. -will need to follow up with Dr. Pearlean Brownie upon d/c  History of hyperlipidemia - continue statins with patient getting orally.  History of alcoholism patient states she has not been drinking as much as before last drink was last week.  Tobacco abuse - patient advised to tobacco abuse.  History of chronic pancreatitis denies any abdominal pain at this time.  Niece reports patient is abusing narcotics, would like to consider rehab- patient delines  Thrombocytopenia -prob secondary to alcohol use  Procedures: EGD- Two small, though deep ulcers were found in the gastric antrum (likely source of recent hematemesis); biopsies   Duodenal inflammation was found in the duodenal bulb, normal examined second part of duodenum  Consultations:  GI  Discharge Exam: Filed Vitals:   05/24/14 0830  BP: 108/56  Pulse: 87  Temp: 98.2 F (36.8 C)  Resp: 16    General: A+Ox3, NAD- up eating breakfast Cardiovascular: rrr Respiratory: clear  Discharge Instructions   Discharge Instructions    Diet general    Complete by:  As directed      Discharge instructions    Complete by:  As directed   Supervision by family -VERY important patient goes  to follow up appointments -have decreased home medications as patient is on multiple medications that can alter mental status -CBC 1 week BID PPI for at least 8 weeks No NSAIDs (ibuprofen, naprosyn) No alcohol     Increase activity slowly    Complete by:  As directed           Current Discharge Medication List    START taking these  medications   Details  pantoprazole (PROTONIX) 40 MG tablet Take 1 tablet (40 mg total) by mouth 2 (two) times daily before a meal. Qty: 60 tablet, Refills: 0      CONTINUE these medications which have CHANGED   Details  cyclobenzaprine (FLEXERIL) 5 MG tablet Take 1 tablet (5 mg total) by mouth 2 (two) times daily. Qty: 30 tablet, Refills: 0      CONTINUE these medications which have NOT CHANGED   Details  albuterol (PROVENTIL HFA;VENTOLIN HFA) 108 (90 BASE) MCG/ACT inhaler Inhale 2 puffs into the lungs every 8 (eight) hours as needed for wheezing or shortness of breath.    atorvastatin (LIPITOR) 20 MG tablet Take 1 tablet (20 mg total) by mouth daily. Qty: 30 tablet, Refills: 3    cycloSPORINE (RESTASIS) 0.05 % ophthalmic emulsion Place 1 drop into both eyes 2 (two) times daily.    gabapentin (NEURONTIN) 300 MG capsule Take 300 mg by mouth 2 (two) times daily. Refills: 1    loratadine (CLARITIN) 10 MG tablet Take 10 mg by mouth daily. Refills: 2    rOPINIRole (REQUIP) 0.25 MG tablet Take 0.25 mg by mouth 3 (three) times daily.    sertraline (ZOLOFT) 50 MG tablet Take 50 mg by mouth daily.    SIMBRINZA 1-0.2 % SUSP Place 1 drop into both eyes 3 (three) times daily.  Refills: 4    temazepam (RESTORIL) 15 MG capsule Take 15 mg by mouth at bedtime.    Travoprost, BAK Free, (TRAVATAN) 0.004 % SOLN ophthalmic solution Place 1 drop into both eyes daily.    oxyCODONE-acetaminophen (PERCOCET) 7.5-325 MG per tablet Take 1 tablet by mouth every 4 (four) hours as needed for pain.  Refills: 0      STOP taking these medications     aspirin EC 81 MG EC tablet      clopidogrel (PLAVIX) 75 MG tablet      naproxen (NAPROSYN) 500 MG tablet      nicotine (NICODERM CQ) 21 mg/24hr patch        Allergies  Allergen Reactions  . Banana Other (See Comments)    Welts on skin and sores in mouth  . Ibuprofen Other (See Comments)    blisters  . Penicillins Other (See Comments)     blister   Follow-up Information    Follow up with [facility], MD In 1 week.   Specialty:  Internal Medicine   Why:  for CBC   Contact information:   [address] [place] [address] [phone]       Follow up with SETHI,PRAMOD, MD In 1 week.   Specialties:  Neurology, Radiology   Why:  for CVA and detmination need for ASA/plavix   Contact information:   [address] [place] [address] [phone]       Follow up with PYRTLE, Carie Caddy, MD In 2 weeks.   Specialty:  Gastroenterology   Why:  for biospy results   Contact information:   [address] [place] [address] [phone]  The results of significant diagnostics from this hospitalization (including imaging, microbiology, ancillary and laboratory) are listed below for reference.    Significant Diagnostic Studies: Ct Head Wo Contrast  05/22/2014   CLINICAL DATA:  On unwitnessed fall.  Dizziness and lightheaded.  EXAM: CT HEAD WITHOUT CONTRAST  CT CERVICAL SPINE WITHOUT CONTRAST  TECHNIQUE: Multidetector CT imaging of the head and cervical spine was performed following the standard protocol without intravenous contrast. Multiplanar CT image reconstructions of the cervical spine were also generated.  COMPARISON:  Head CT and MRI 10/09/2012  FINDINGS: CT HEAD FINDINGS  No intracranial hemorrhage, mass effect, or midline shift. No hydrocephalus. The basilar cisterns are patent. No evidence of territorial infarct. No intracranial fluid collection. Linear low attenuation in the right and left external capsule, unchanged. Calvarium is intact. Improved aeration of left maxillary sinus with mild residual mucosal thickening. The mastoid air cells are well aerated.  CT CERVICAL SPINE FINDINGS  Cervical spine alignment is maintained. Vertebral body heights are preserved. There is no fracture. The dens is intact. There are no jumped or perched facets. There is disc space narrowing at C5-C6. Mild  endplate spurring from C4-C5 through C6-C7. No prevertebral soft tissue edema. Tiny focus of atelectasis at the right lung apex.  IMPRESSION: 1.  No acute intracranial abnormality. 2. No fracture or subluxation of the cervical spine.   Electronically Signed   By: Rubye Oaks M.D.   On: 05/22/2014 03:58   Ct Cervical Spine Wo Contrast  05/22/2014   CLINICAL DATA:  On unwitnessed fall.  Dizziness and lightheaded.  EXAM: CT HEAD WITHOUT CONTRAST  CT CERVICAL SPINE WITHOUT CONTRAST  TECHNIQUE: Multidetector CT imaging of the head and cervical spine was performed following the standard protocol without intravenous contrast. Multiplanar CT image reconstructions of the cervical spine were also generated.  COMPARISON:  Head CT and MRI 10/09/2012  FINDINGS: CT HEAD FINDINGS  No intracranial hemorrhage, mass effect, or midline shift. No hydrocephalus. The basilar cisterns are patent. No evidence of territorial infarct. No intracranial fluid collection. Linear low attenuation in the right and left external capsule, unchanged. Calvarium is intact. Improved aeration of left maxillary sinus with mild residual mucosal thickening. The mastoid air cells are well aerated.  CT CERVICAL SPINE FINDINGS  Cervical spine alignment is maintained. Vertebral body heights are preserved. There is no fracture. The dens is intact. There are no jumped or perched facets. There is disc space narrowing at C5-C6. Mild endplate spurring from C4-C5 through C6-C7. No prevertebral soft tissue edema. Tiny focus of atelectasis at the right lung apex.  IMPRESSION: 1.  No acute intracranial abnormality. 2. No fracture or subluxation of the cervical spine.   Electronically Signed   By: Rubye Oaks M.D.   On: 05/22/2014 03:58   US Abdomen Complete  05/22/2014   CLINICAL DATA:  Evaluate for cirrhosis or fatty liver.  EXAM: ULTRASOUND ABDOMEN COMPLETE  COMPARISON:  CT 05/19/2010  FINDINGS: Gallbladder: Status post cholecystectomy.  Common bile  duct: Diameter: 9.5 mm  Liver: No focal lesion identified. Within normal limits in parenchymal echogenicity.  IVC: No abnormality visualized.  Pancreas: Visualized portion unremarkable.  Spleen: Not well visualized due to overlying rib shadows.  Right Kidney: Length: 9.9 cm. Echogenicity within normal limits. No mass or hydronephrosis visualized.  Left Kidney: Length: 10.6 cm. Echogenicity within normal limits. No mass or hydronephrosis visualized.  Abdominal aorta: Distal aorta obscured by bowel gas. No aneurysm visualized.  Other findings: None.  IMPRESSION: Status post cholecystectomy.  Common bile duct within normal limits status post cholecystectomy, measuring 9.5 mm.   Electronically Signed   By: Annia Beltrew  Davis M.D.   On: 05/22/2014 12:25   Dg Abd Acute W/chest  05/22/2014   CLINICAL DATA:  Abdominal pain, vomiting, fever since yesterday. Fall tonight.  EXAM: ACUTE ABDOMEN SERIES (ABDOMEN 2 VIEW & CHEST 1 VIEW)  COMPARISON:  Chest radiograph 10/09/2012  FINDINGS: The cardiomediastinal contours are normal. The lungs are hyperinflated but clear. There is no free intra-abdominal air. No dilated bowel loops to suggest obstruction. Small volume of stool throughout the colon. No radiopaque calculi. No acute osseous abnormalities are seen.  IMPRESSION: Normal bowel gas pattern. No free air. Hyperinflated but clear lungs.   Electronically Signed   By: Rubye OaksMelanie  Ehinger M.D.   On: 05/22/2014 04:39    Microbiology: Recent Results (from the past 240 hour(s))  Urine culture     Status: None (Preliminary result)   Collection Time: 05/22/14  8:26 PM  Result Value Ref Range Status   Specimen Description URINE, RANDOM  Final   Special Requests NONE  Final   Colony Count PENDING  Incomplete   Culture   Final    Culture reincubated for better growth Performed at [facility]    Report Status PENDING  Incomplete     Labs: Basic Metabolic Panel:  Recent Labs Lab 05/22/14 0409 05/22/14 1251  05/22/14 1300 05/23/14 0320 05/24/14 0503  NA 144 143  --  147* 144  K 3.3* 3.6  --  3.8 3.7  CL 114* 119*  --  120* 116*  CO2 22 20  --  20 24  GLUCOSE 96 78  --  82 113*  BUN 50* 30*  --  13 7  CREATININE 0.93 0.90  --  0.85 0.87  CALCIUM 8.3* 8.1*  --  8.0* 8.7  MG  --   --  1.8  --   --    Liver Function Tests:  Recent Labs Lab 05/22/14 0409 05/22/14 1251  AST 58* 54*  ALT 72* 63*  ALKPHOS 54 54  BILITOT 1.0 1.0  PROT 6.3 6.4  ALBUMIN 3.4* 3.6   No results for input(s): LIPASE, AMYLASE in the last 168 hours. No results for input(s): AMMONIA in the last 168 hours. CBC:  Recent Labs Lab 05/22/14 0409 05/22/14 1251 05/22/14 1644 05/22/14 2005 05/23/14 0014 05/23/14 0320 05/24/14 0503  WBC 12.6* 9.0 9.2 7.6  --  8.1 6.8  NEUTROABS 8.7*  --   --   --   --   --   --   HGB 9.9* 8.5* 7.7* 7.2* 8.4* 8.6* 9.1*  HCT 29.0* 25.7* 22.7* 21.8* 24.9* 26.0* 26.8*  MCV 91.8 92.4 92.7 92.0  --  92.9 91.2  PLT 169 154 130* 129*  --  115* 127*   Cardiac Enzymes:  Recent Labs Lab 05/22/14 0409  TROPONINI <0.03   BNP: BNP (last 3 results) No results for input(s): BNP in the last 8760 hours.  ProBNP (last 3 results) No results for input(s): PROBNP in the last 8760 hours.  CBG:  Recent Labs Lab 05/23/14 0637  GLUCAP 83       Signed:  VANN, JESSICA  [facility] 05/24/2014, 8:43 AM

## 2014-05-24 NOTE — Discharge Instructions (Signed)
Gastrointestinal Bleeding Gastrointestinal bleeding is bleeding somewhere along the path that food travels through the body (digestive tract). This path is anywhere between the mouth and the opening of the butt (anus). You may have blood in your throw up (vomit) or in your poop (stools). If there is a lot of bleeding, you may need to stay in the hospital. HOME CARE  Only take medicine as told by your doctor.  Eat foods with fiber such as whole grains, fruits, and vegetables. You can also try eating 1 to 3 prunes a day.  Drink enough fluids to keep your pee (urine) clear or pale yellow. GET HELP RIGHT AWAY IF:   Your bleeding gets worse.  You feel dizzy, weak, or you pass out (faint).  You have bad cramps in your back or belly (abdomen).  You have large blood clumps (clots) in your poop.  Your problems are getting worse. MAKE SURE YOU:   Understand these instructions.  Will watch your condition.  Will get help right away if you are not doing well or get worse. Document Released: 12/08/2007 Document Revised: 02/15/2012 Document Reviewed: 02/07/2011 [facility] Patient Information 2015 [place], [place]. This information is not intended to replace advice given to you by your health care provider. Make sure you discuss any questions you have with your health care provider.   Cardiac Diet This diet can help prevent heart disease and stroke. Many factors influence your heart health, including eating and exercise habits. Coronary risk rises a lot with abnormal blood fat (lipid) levels. Cardiac meal planning includes limiting unhealthy fats, increasing healthy fats, and making other small dietary changes. General guidelines are as follows:  Adjust calorie intake to reach and maintain desirable body weight.  Limit total fat intake to less than 30% of total calories. Saturated fat should be less than 7% of calories.  Saturated fats are found in animal products and in some vegetable products.  Saturated vegetable fats are found in coconut oil, cocoa butter, palm oil, and palm kernel oil. Read labels carefully to avoid these products as much as possible. Use butter in moderation. Choose tub margarines and oils that have 2 grams of fat or less. Good cooking oils are canola and olive oils.  Practice low-fat cooking techniques. Do not fry food. Instead, broil, bake, boil, steam, grill, roast on a rack, stir-fry, or microwave it. Other fat reducing suggestions include:  Remove the skin from poultry.  Remove all visible fat from meats.  Skim the fat off stews, soups, and gravies before serving them.  Steam vegetables in water or broth instead of sauting them in fat.  Avoid foods with trans fat (or hydrogenated oils), such as commercially fried foods and commercially baked goods. Commercial shortening and deep-frying fats will contain trans fat.  Increase intake of fruits, vegetables, whole grains, and legumes to replace foods high in fat.  Increase consumption of nuts, legumes, and seeds to at least 4 servings weekly. One serving of a legume equals  cup, and 1 serving of nuts or seeds equals  cup.  Choose whole grains more often. Have 3 servings per day (a serving is 1 ounce [oz]).  Eat 4 to 5 servings of vegetables per day. A serving of vegetables is 1 cup of raw leafy vegetables;  cup of raw or cooked cut-up vegetables;  cup of vegetable juice.  Eat 4 to 5 servings of fruit per day. A serving of fruit is 1 medium whole fruit;  cup of dried fruit;  cup  of fresh, frozen, or canned fruit;  cup of 100% fruit juice.  Increase your intake of dietary fiber to 20 to 30 grams per day. Insoluble fiber may help lower your risk of heart disease and may help curb your appetite.  Soluble fiber binds cholesterol to be removed from the blood. Foods high in soluble fiber are dried beans, citrus fruits, oats, apples, bananas, broccoli, Brussels sprouts, and eggplant.  Try to include foods  fortified with plant sterols or stanols, such as yogurt, breads, juices, or margarines. Choose several fortified foods to achieve a daily intake of 2 to 3 grams of plant sterols or stanols.  Foods with omega-3 fats can help reduce your risk of heart disease. Aim to have a 3.5 oz portion of fatty fish twice per week, such as salmon, mackerel, albacore tuna, sardines, lake trout, or herring. If you wish to take a fish oil supplement, choose one that contains 1 gram of both DHA and EPA.  Limit processed meats to 2 servings (3 oz portion) weekly.  Limit the sodium in your diet to 1500 milligrams (mg) per day. If you have high blood pressure, talk to a registered dietitian about a DASH (Dietary Approaches to Stop Hypertension) eating plan.  Limit sweets and beverages with added sugar, such as soda, to no more than 5 servings per week. One serving is:   1 tablespoon sugar.  1 tablespoon jelly or jam.   cup sorbet.  1 cup lemonade.   cup regular soda. CHOOSING FOODS Starches  Allowed: Breads: All kinds (wheat, rye, raisin, white, oatmeal, [place], [place], and English muffin bread). Low-fat rolls: English muffins, frankfurter and hamburger buns, bagels, pita bread, tortillas (not fried). Pancakes, waffles, biscuits, and muffins made with recommended oil.  Avoid: Products made with saturated or trans fats, oils, or whole milk products. Butter rolls, cheese breads, croissants. Commercial doughnuts, muffins, sweet rolls, biscuits, waffles, pancakes, store-bought mixes. Crackers  Allowed: Low-fat crackers and snacks: Animal, graham, rye, saltine (with recommended oil, no lard), oyster, and matzo crackers. Bread sticks, melba toast, rusks, flatbread, pretzels, and light popcorn.  Avoid: High-fat crackers: cheese crackers, butter crackers, and those made with coconut, palm oil, or trans fat (hydrogenated oils). Buttered popcorn. Cereals  Allowed: Hot or cold whole-grain cereals.  Avoid:  Cereals containing coconut, hydrogenated vegetable fat, or animal fat. Potatoes / Pasta / Rice  Allowed: All kinds of potatoes, rice, and pasta (such as macaroni, spaghetti, and noodles).  Avoid: Pasta or rice prepared with cream sauce or high-fat cheese. Chow mein noodles, [place] fries. Vegetables  Allowed: All vegetables and vegetable juices.  Avoid: Fried vegetables. Vegetables in cream, butter, or high-fat cheese sauces. Limit coconut. Fruit in cream or custard. Protein  Allowed: Limit your intake of meat, seafood, and poultry to no more than 6 oz (cooked weight) per day. All lean, well-trimmed beef, veal, pork, and lamb. All chicken and [place] without skin. All fish and shellfish. Wild game: wild duck, rabbit, pheasant, and venison. Egg whites or low-cholesterol egg substitutes may be used as desired. Meatless dishes: recipes with dried beans, peas, lentils, and tofu (soybean curd). Seeds and nuts: all seeds and most nuts.  Avoid: Prime grade and other heavily marbled and fatty meats, such as short ribs, spare ribs, rib eye roast or steak, frankfurters, sausage, bacon, and high-fat luncheon meats, mutton. Caviar. Commercially fried fish. Domestic duck, goose, venison sausage. Organ meats: liver, gizzard, heart, chitterlings, brains, kidney, sweetbreads. Dairy  Allowed: Low-fat cheeses: nonfat or low-fat cottage cheese (  1% or 2% fat), cheeses made with part skim milk, such as mozzarella, farmers, string, or ricotta. (Cheeses should be labeled no more than 2 to 6 grams fat per oz.). Skim (or 1%) milk: liquid, powdered, or evaporated. Buttermilk made with low-fat milk. Drinks made with skim or low-fat milk or cocoa. Chocolate milk or cocoa made with skim or low-fat (1%) milk. Nonfat or low-fat yogurt.  Avoid: Whole milk cheeses, including colby, cheddar, mu[place], [address], [place], [place]rie, [place]ember[place], [address], Swiss, and blue. Creamed cottage cheese, cream cheese. Whole milk and whole milk  products, including buttermilk or yogurt made from whole milk, drinks made from whole milk. Condensed milk, evaporated whole milk, and 2% milk. Soups and Combination Foods  Allowed: Low-fat low-sodium soups: broth, dehydrated soups, homemade broth, soups with the fat removed, homemade cream soups made with skim or low-fat milk. Low-fat spaghetti, lasagna, chili, and Spanish rice if low-fat ingredients and low-fat cooking techniques are used.  Avoid: Cream soups made with whole milk, cream, or high-fat cheese. All other soups. Desserts and Sweets  Allowed: Sherbet, fruit ices, gelatins, meringues, and angel food cake. Homemade desserts with recommended fats, oils, and milk products. Jam, jelly, honey, marmalade, sugars, and syrups. Pure sugar candy, such as gum drops, hard candy, jelly beans, marshmallows, mints, and small amounts of dark chocolate.  Avoid: Commercially prepared cakes, pies, cookies, frosting, pudding, or mixes for these products. Desserts containing whole milk products, chocolate, coconut, lard, palm oil, or palm kernel oil. Ice cream or ice cream drinks. Candy that contains chocolate, coconut, butter, hydrogenated fat, or unknown ingredients. Buttered syrups. Fats and Oils  Allowed: Vegetable oils: safflower, sunflower, corn, soybean, cottonseed, sesame, canola, olive, or peanut. Non-hydrogenated margarines. Salad dressing or mayonnaise: homemade or commercial, made with a recommended oil. Low or nonfat salad dressing or mayonnaise.  Limit added fats and oils to 6 to 8 tsp per day (includes fats used in cooking, baking, salads, and spreads on bread). Remember to count the "hidden fats" in foods.  Avoid: Solid fats and shortenings: butter, lard, salt pork, bacon drippings. Gravy containing meat fat, shortening, or suet. Cocoa butter, coconut. Coconut oil, palm oil, palm kernel oil, or hydrogenated oils: these ingredients are often used in bakery products, nondairy creamers, whipped  toppings, candy, and commercially fried foods. Read labels carefully. Salad dressings made of unknown oils, sour cream, or cheese, such as blue cheese and Roquefort. Cream, all kinds: half-and-half, light, heavy, or whipping. Sour cream or cream cheese (even if "light" or low-fat). Nondairy cream substitutes: coffee creamers and sour cream substitutes made with palm, palm kernel, hydrogenated oils, or coconut oil. Beverages  Allowed: Coffee (regular or decaffeinated), tea. Diet carbonated beverages, mineral water. Alcohol: Check with your caregiver. Moderation is recommended.  Avoid: Whole milk, regular sodas, and juice drinks with added sugar. Condiments  Allowed: All seasonings and condiments. Cocoa powder. "Cream" sauces made with recommended ingredients.  Avoid: Carob powder made with hydrogenated fats. SAMPLE MENU Breakfast   cup orange juice   cup oatmeal  1 slice toast  1 tsp margarine  1 cup skim milk Lunch  [place] sandwich with 2 oz [place]key, 2 slices bread  Lettuce and tomato slices  Fresh fruit  Carrot sticks  Coffee or tea Snack  Fresh fruit or low-fat crackers Dinner  3 oz lean ground beef  1 baked potato  1 tsp margarine   cup asparagus  Lettuce salad  1 tbs non-creamy dressing   cup peach slices  1 cup skim  milk Document Released: 12/08/2007 Document Revised: 08/30/2011 Document Reviewed: 04/30/2013 [facility] Patient Information 2015 [place], [place]. This information is not intended to replace advice given to you by your health care provider. Make sure you discuss any questions you have with your health care provider.

## 2014-05-25 LAB — URINE CULTURE

## 2014-05-26 ENCOUNTER — Encounter (HOSPITAL_COMMUNITY): Payer: Self-pay | Admitting: Internal Medicine

## 2014-05-30 ENCOUNTER — Encounter: Payer: Self-pay | Admitting: Internal Medicine

## 2014-12-11 ENCOUNTER — Encounter: Payer: Self-pay | Admitting: Gastroenterology

## 2015-02-03 ENCOUNTER — Ambulatory Visit: Payer: Medicaid Other | Admitting: Gastroenterology

## 2015-02-13 ENCOUNTER — Encounter: Payer: Self-pay | Admitting: Gastroenterology

## 2015-04-22 ENCOUNTER — Ambulatory Visit: Payer: Medicaid Other | Admitting: Gastroenterology

## 2015-04-22 ENCOUNTER — Telehealth: Payer: Self-pay | Admitting: Gastroenterology

## 2015-04-22 NOTE — Telephone Encounter (Signed)
Please begin dismissal process for persistent no-shows, late cancellations.  thanks

## 2015-04-22 NOTE — Telephone Encounter (Signed)
Dismissal form and letter given to Dr Christella Hartigan for signature.

## 2015-04-22 NOTE — Telephone Encounter (Signed)
Dr Christella Hartigan this pt cancelled today and has had no shows before do you want to charge?

## 2015-04-23 ENCOUNTER — Telehealth: Payer: Self-pay | Admitting: Gastroenterology

## 2015-04-23 NOTE — Telephone Encounter (Signed)
Patient dismissed from [facility] by Rob Bunting MD , effective April 22, 2015. Dismissal letter sent out by certified / registered mail.  [facility]  Certified dismissal letter returned as undeliverable, unclaimed, return to sender after three attempts by [employer]PS. Letter placed in another envelope and resent as 1st class mail which does not require a signature. 05/25/15 DAJ

## 2015-06-12 ENCOUNTER — Ambulatory Visit (INDEPENDENT_AMBULATORY_CARE_PROVIDER_SITE_OTHER): Payer: Medicaid Other | Admitting: Gastroenterology

## 2015-06-12 ENCOUNTER — Encounter: Payer: Self-pay | Admitting: Gastroenterology

## 2015-06-12 ENCOUNTER — Telehealth: Payer: Self-pay

## 2015-06-12 VITALS — BP 126/74 | HR 96 | Ht 64.0 in | Wt 143.4 lb

## 2015-06-12 DIAGNOSIS — K259 Gastric ulcer, unspecified as acute or chronic, without hemorrhage or perforation: Secondary | ICD-10-CM | POA: Diagnosis not present

## 2015-06-12 MED ORDER — NA SULFATE-K SULFATE-MG SULF 17.5-3.13-1.6 GM/177ML PO SOLN: 1.0000 | Freq: Once | ORAL | Status: DC

## 2015-06-12 NOTE — Telephone Encounter (Signed)
I saw the patient in the hospital in July 2014 for a stroke. Since then  i have not seen the patient is for follow-up but if the patient has not had any recurrent stroke or TIA it may be reasonable to stop Plavix for 5 days prior to the scheduled procedure and restarted after with a small but acceptable peri-procedure and risk for TIA/stroke if accepted by the patient.

## 2015-06-12 NOTE — Telephone Encounter (Signed)
The patient has been notified of this information and all questions answered.

## 2015-06-12 NOTE — Progress Notes (Signed)
HPI: This is a   pleasant 60 year old woman whom I last saw about 9 years ago here in our office.  Chief complaint is gastric ulcer, abdominal pains  She was admitted with hematemesis 3 weeks ago, EGD with Dr. Rhea Belton showed 2 small but deep gastric ulcers, non-bleeding.  Biopsies neg for h. Pylori.  2008 EGD for hematemesis Dr. Christella Hartigan founds relfux esophagitis, supposed cause of her bleeding, was recommended to be on PPI twice daily.  2008 EUS Dr. Christella Hartigan for dilated CBD, PD, found changes of chronic pancreatitis; etoh cause.  Hb was 8, was given one unit blood.  She continues to drink and per family she's been abusing narcotics.  Since leaving the hospital she's felt not so good; burning after eating.  A lot of bloating.  No more vomiting of blood.    She has been taking protonix twice per day.  She never resumed the plavix.  No NSAIDs.  Overall she's been gaining weight.    Had been taking percocet, but not now.  SAys her last drink was 3 weeks ago.    Says no illicit drugs lately.  Past Medical History  Diagnosis Date  . Chronic back pain   . MDD (major depressive disorder) (HCC)   . Alcohol abuse   . PTSD (post-traumatic stress disorder)   . UTI (lower urinary tract infection)   . Colitis   . Stroke (HCC)   . Pancreatitis   . Hepatitis     Hepatitis C  . GI bleed   . Gastric ulcer   . Fatty liver     Past Surgical History  Procedure Laterality Date  . Esophagogastroduodenoscopy endoscopy    . Esophagogastroduodenoscopy N/A 05/23/2014    Procedure: ESOPHAGOGASTRODUODENOSCOPY (EGD);  Surgeon: Beverley Fiedler, MD;  Location: [facility] ENDOSCOPY;  Service: Endoscopy;  Laterality: N/A;  . Appendectomy    . Gall stone removed    . Abdominal hysterectomy      Current Outpatient Prescriptions  Medication Sig Dispense Refill  . albuterol (PROVENTIL HFA;VENTOLIN HFA) 108 (90 BASE) MCG/ACT inhaler Inhale 2 puffs into the lungs every 8 (eight) hours as needed for wheezing or  shortness of breath.    . cyclobenzaprine (FLEXERIL) 5 MG tablet Take 1 tablet (5 mg total) by mouth 2 (two) times daily. 30 tablet 0  . cycloSPORINE (RESTASIS) 0.05 % ophthalmic emulsion Place 1 drop into both eyes 2 (two) times daily.    Marland Kitchen gabapentin (NEURONTIN) 300 MG capsule Take 300 mg by mouth 2 (two) times daily.  1  . loratadine (CLARITIN) 10 MG tablet Take 10 mg by mouth daily.  2  . oxyCODONE-acetaminophen (PERCOCET) 7.5-325 MG per tablet Take 1 tablet by mouth every 4 (four) hours as needed for pain.   0  . pantoprazole (PROTONIX) 40 MG tablet Take 1 tablet (40 mg total) by mouth 2 (two) times daily before a meal. 60 tablet 0  . rOPINIRole (REQUIP) 0.25 MG tablet Take 0.25 mg by mouth 3 (three) times daily.    . sertraline (ZOLOFT) 50 MG tablet Take 50 mg by mouth daily.    Marland Kitchen SIMBRINZA 1-0.2 % SUSP Place 1 drop into both eyes 3 (three) times daily.   4  . temazepam (RESTORIL) 15 MG capsule Take 15 mg by mouth at bedtime.    . Travoprost, BAK Free, (TRAVATAN) 0.004 % SOLN ophthalmic solution Place 1 drop into both eyes daily.     No current facility-administered medications for this visit.    Allergies  as of 06/12/2015 - Review Complete 06/12/2015  Allergen Reaction Noted  . Banana Other (See Comments) 05/22/2014  . Ibuprofen Other (See Comments)   . Penicillins Other (See Comments)     Family History  Problem Relation Age of Onset  . Stroke Mother   . Cancer Sister     type unknown  . Diabetes Father     Social History   Social History  . Marital Status: Widowed    Spouse Name: N/A  . Number of Children: 0  . Years of Education: N/A   Occupational History  . Not on file.   Social History Main Topics  . Smoking status: Current Every Day Smoker -- 0.50 packs/day    Types: Cigarettes  . Smokeless tobacco: Former NeurosurgeonUser    Types: Chew    Quit date: 03/14/1978  . Alcohol Use: 0.0 oz/week    0 Standard drinks or equivalent per week     Comment: ocassional  . Drug  Use: No  . Sexual Activity: Not on file   Other Topics Concern  . Not on file   Social History Narrative     Physical Exam: BP 126/74 mmHg  Pulse 96  Ht 5\' 4"  (1.626 m)  Wt 143 lb 6 oz (65.034 kg)  BMI 24.60 kg/m2 Constitutional: generally Sedated appearing Psychiatric: alert and oriented x3 Abdomen: soft, nontender, nondistended, no obvious ascites, no peritoneal signs, normal bowel sounds   Assessment and plan: 60 y.o. female with recent gastric ulcers, anemia  She never resumed her Plavix as she had been instructed to I recommended that she do so today. She will stay on Protonix twice daily. I recommended repeat EGD in about a month's time to check for gastric ulcer healing and we will schedule that for her. At the same time I think getting her up-to-date on colon cancer screening with a colonoscopy is a good idea since she was also found to be anemic. She seemed quite sedated here in the office today, has a history of polysubstance abuse. Today she declined using illicit drugs and so did her care partner. I'm not sure if I believe that however.   Rob Buntinganiel Jacobs, MD [facility] 06/12/2015, 10:31 AM

## 2015-06-12 NOTE — Telephone Encounter (Signed)
06/12/2015   RE: [name] DOB: 17-Sep-1955 MRN: [id number]   Dear Dr Pearlean BrownieSethi,    We have scheduled the above patient for an endoscopic procedure. Our records show that she is on anticoagulation therapy.   Please advise as to how long the patient may come off her therapy of Plavix prior to the procedure, which is scheduled for 07/23/15.  Please fax back/ or route  to [place], [place] at [phone].   Sincerely,    Chales AbrahamsPatty Lewis RN

## 2015-06-12 NOTE — Patient Instructions (Addendum)
You will be set up for an upper endoscopy in early May ([facility] with [facility]) for gastric ulcer. You will be set up for a colonoscopy for routine screening at the same time. Stay on protonix twice daily. Restart you plavix today.  You will be contacted by our office prior to your procedure for directions on holding your Plavix.  If you do not hear from our office 1 week prior to your scheduled procedure, please call [phone] to discuss.

## 2015-06-12 NOTE — Telephone Encounter (Signed)
FYI:  Dr Christella HartiganJacobs this is the response from Dr Pearlean BrownieSethi.

## 2015-06-12 NOTE — Telephone Encounter (Signed)
Ok, please let her know she can stop plavix (for good) and instead stay on ASA once 81mg .  No need to hold ASA for her upcoming procedures.

## 2015-07-03 ENCOUNTER — Telehealth: Payer: Self-pay | Admitting: Gastroenterology

## 2015-07-03 NOTE — Telephone Encounter (Signed)
Dismissal "suspend" per Dr.Daniel Christella HartiganJacobs 07/03/15  DAJ

## 2015-07-15 ENCOUNTER — Encounter (HOSPITAL_COMMUNITY): Payer: Self-pay | Admitting: *Deleted

## 2015-07-17 ENCOUNTER — Telehealth: Payer: Self-pay | Admitting: Gastroenterology

## 2015-07-20 NOTE — Telephone Encounter (Signed)
The pt has been rescheduled to 08/13/15 1230 pm, she was re instructed and Noreene LarssonJill with [facility] endo called and appt changed.  The pt will call back with any questions or concerns

## 2015-07-29 ENCOUNTER — Encounter (HOSPITAL_COMMUNITY): Payer: Self-pay | Admitting: *Deleted

## 2015-08-13 ENCOUNTER — Ambulatory Visit (HOSPITAL_COMMUNITY): Payer: Medicaid Other | Admitting: Certified Registered Nurse Anesthetist

## 2015-08-13 ENCOUNTER — Ambulatory Visit (HOSPITAL_COMMUNITY)
Admission: RE | Admit: 2015-08-13 | Discharge: 2015-08-13 | Disposition: A | Discharge status: Home / Self Care | Payer: Medicaid Other | Source: Ambulatory Visit | Attending: Gastroenterology | Admitting: Gastroenterology

## 2015-08-13 ENCOUNTER — Encounter (HOSPITAL_COMMUNITY)
Admission: RE | Disposition: A | Discharge status: Home / Self Care | Payer: Self-pay | Source: Ambulatory Visit | Attending: Gastroenterology

## 2015-08-13 ENCOUNTER — Encounter (HOSPITAL_COMMUNITY): Payer: Self-pay | Admitting: Gastroenterology

## 2015-08-13 DIAGNOSIS — I10 Essential (primary) hypertension: Secondary | ICD-10-CM | POA: Diagnosis not present

## 2015-08-13 DIAGNOSIS — G8929 Other chronic pain: Secondary | ICD-10-CM | POA: Diagnosis not present

## 2015-08-13 DIAGNOSIS — Z79899 Other long term (current) drug therapy: Secondary | ICD-10-CM | POA: Insufficient documentation

## 2015-08-13 DIAGNOSIS — F329 Major depressive disorder, single episode, unspecified: Secondary | ICD-10-CM | POA: Insufficient documentation

## 2015-08-13 DIAGNOSIS — K2970 Gastritis, unspecified, without bleeding: Secondary | ICD-10-CM | POA: Diagnosis not present

## 2015-08-13 DIAGNOSIS — J45909 Unspecified asthma, uncomplicated: Secondary | ICD-10-CM | POA: Diagnosis not present

## 2015-08-13 DIAGNOSIS — Z8719 Personal history of other diseases of the digestive system: Secondary | ICD-10-CM | POA: Diagnosis not present

## 2015-08-13 DIAGNOSIS — F17210 Nicotine dependence, cigarettes, uncomplicated: Secondary | ICD-10-CM | POA: Insufficient documentation

## 2015-08-13 DIAGNOSIS — Z79891 Long term (current) use of opiate analgesic: Secondary | ICD-10-CM | POA: Diagnosis not present

## 2015-08-13 DIAGNOSIS — D649 Anemia, unspecified: Secondary | ICD-10-CM | POA: Diagnosis not present

## 2015-08-13 DIAGNOSIS — M549 Dorsalgia, unspecified: Secondary | ICD-10-CM | POA: Insufficient documentation

## 2015-08-13 DIAGNOSIS — Z1211 Encounter for screening for malignant neoplasm of colon: Secondary | ICD-10-CM

## 2015-08-13 DIAGNOSIS — K259 Gastric ulcer, unspecified as acute or chronic, without hemorrhage or perforation: Secondary | ICD-10-CM

## 2015-08-13 DIAGNOSIS — I739 Peripheral vascular disease, unspecified: Secondary | ICD-10-CM | POA: Insufficient documentation

## 2015-08-13 DIAGNOSIS — Z8673 Personal history of transient ischemic attack (TIA), and cerebral infarction without residual deficits: Secondary | ICD-10-CM | POA: Diagnosis not present

## 2015-08-13 DIAGNOSIS — K219 Gastro-esophageal reflux disease without esophagitis: Secondary | ICD-10-CM | POA: Insufficient documentation

## 2015-08-13 HISTORY — PX: ESOPHAGOGASTRODUODENOSCOPY (EGD) WITH PROPOFOL: SHX5813

## 2015-08-13 HISTORY — PX: COLONOSCOPY WITH PROPOFOL: SHX5780

## 2015-08-13 SURGERY — COLONOSCOPY WITH PROPOFOL
Anesthesia: Monitor Anesthesia Care

## 2015-08-13 MED ORDER — PROPOFOL 10 MG/ML IV BOLUS
INTRAVENOUS | Status: AC
  Filled 2015-08-13: qty 40

## 2015-08-13 MED ORDER — PROPOFOL 500 MG/50ML IV EMUL
INTRAVENOUS | Status: DC | PRN
  Administered 2015-08-13: 200 ug/kg/min via INTRAVENOUS

## 2015-08-13 MED ORDER — LIDOCAINE HCL (CARDIAC) 20 MG/ML IV SOLN
INTRAVENOUS | Status: DC | PRN
  Administered 2015-08-13: 100 mg via INTRAVENOUS

## 2015-08-13 MED ORDER — LACTATED RINGERS IV SOLN
INTRAVENOUS | Status: DC
  Administered 2015-08-13: 1000 mL via INTRAVENOUS

## 2015-08-13 MED ORDER — PROPOFOL 10 MG/ML IV BOLUS
INTRAVENOUS | Status: DC | PRN
Start: 2015-08-13 — End: 2015-08-13
  Administered 2015-08-13 (×3): 50 mg via INTRAVENOUS
  Administered 2015-08-13: 10 mg via INTRAVENOUS

## 2015-08-13 MED ORDER — SODIUM CHLORIDE 0.9 % IV SOLN: INTRAVENOUS | Status: DC

## 2015-08-13 MED ORDER — LIDOCAINE HCL (CARDIAC) 20 MG/ML IV SOLN
INTRAVENOUS | Status: AC
  Filled 2015-08-13: qty 5

## 2015-08-13 SURGICAL SUPPLY — 24 items

## 2015-08-13 NOTE — H&P (Signed)
This is a pleasant 60 year old woman whom I last saw about 9 years ago here in our office.  Chief complaint is gastric ulcer, abdominal pains  She was admitted with hematemesis 3 weeks ago, EGD with Dr. Rhea Belton showed 2 small but deep gastric ulcers, non-bleeding. Biopsies neg for h. Pylori.  2008 EGD for hematemesis Dr. Christella Hartigan founds relfux esophagitis, supposed cause of her bleeding, was recommended to be on PPI twice daily.  2008 EUS Dr. Christella Hartigan for dilated CBD, PD, found changes of chronic pancreatitis; etoh cause.  Hb was 8, was given one unit blood.  She continues to drink and per family she's been abusing narcotics.  Since leaving the hospital she's felt not so good; burning after eating. A lot of bloating. No more vomiting of blood.   She has been taking protonix twice per day. She never resumed the plavix. No NSAIDs.  Overall she's been gaining weight.   Had been taking percocet, but not now.  SAys her last drink was 3 weeks ago.   Says no illicit drugs lately.  Past Medical History  Diagnosis Date  . Chronic back pain   . MDD (major depressive disorder) (HCC)   . Alcohol abuse   . PTSD (post-traumatic stress disorder)   . UTI (lower urinary tract infection)   . Colitis   . Stroke (HCC)   . Pancreatitis   . Hepatitis     Hepatitis C  . GI bleed   . Gastric ulcer   . Fatty liver     Past Surgical History  Procedure Laterality Date  . Esophagogastroduodenoscopy endoscopy    . Esophagogastroduodenoscopy N/A 05/23/2014    Procedure: ESOPHAGOGASTRODUODENOSCOPY (EGD); Surgeon: Beverley Fiedler, MD; Location: [facility] ENDOSCOPY; Service: Endoscopy; Laterality: N/A;  . Appendectomy    . Gall stone removed    . Abdominal hysterectomy      Current Outpatient Prescriptions  Medication Sig Dispense Refill  . albuterol (PROVENTIL HFA;VENTOLIN HFA) 108 (90 BASE) MCG/ACT inhaler  Inhale 2 puffs into the lungs every 8 (eight) hours as needed for wheezing or shortness of breath.    . cyclobenzaprine (FLEXERIL) 5 MG tablet Take 1 tablet (5 mg total) by mouth 2 (two) times daily. 30 tablet 0  . cycloSPORINE (RESTASIS) 0.05 % ophthalmic emulsion Place 1 drop into both eyes 2 (two) times daily.    Marland Kitchen gabapentin (NEURONTIN) 300 MG capsule Take 300 mg by mouth 2 (two) times daily.  1  . loratadine (CLARITIN) 10 MG tablet Take 10 mg by mouth daily.  2  . oxyCODONE-acetaminophen (PERCOCET) 7.5-325 MG per tablet Take 1 tablet by mouth every 4 (four) hours as needed for pain.   0  . pantoprazole (PROTONIX) 40 MG tablet Take 1 tablet (40 mg total) by mouth 2 (two) times daily before a meal. 60 tablet 0  . rOPINIRole (REQUIP) 0.25 MG tablet Take 0.25 mg by mouth 3 (three) times daily.    . sertraline (ZOLOFT) 50 MG tablet Take 50 mg by mouth daily.    Marland Kitchen SIMBRINZA 1-0.2 % SUSP Place 1 drop into both eyes 3 (three) times daily.   4  . temazepam (RESTORIL) 15 MG capsule Take 15 mg by mouth at bedtime.    . Travoprost, BAK Free, (TRAVATAN) 0.004 % SOLN ophthalmic solution Place 1 drop into both eyes daily.     No current facility-administered medications for this visit.    Allergies as of 06/12/2015 - Review Complete 06/12/2015  Allergen Reaction Noted  . Banana Other (  See Comments) 05/22/2014  . Ibuprofen Other (See Comments)   . Penicillins Other (See Comments)     Family History  Problem Relation Age of Onset  . Stroke Mother   . Cancer Sister     type unknown  . Diabetes Father     Social History   Social History  . Marital Status: Widowed    Spouse Name: N/A  . Number of Children: 0  . Years of Education: N/A   Occupational History  . Not on file.   Social History Main Topics  . Smoking status: Current Every Day Smoker -- 0.50  packs/day    Types: Cigarettes  . Smokeless tobacco: Former NeurosurgeonUser    Types: Chew    Quit date: 03/14/1978  . Alcohol Use: 0.0 oz/week    0 Standard drinks or equivalent per week     Comment: ocassional  . Drug Use: No  . Sexual Activity: Not on file   Other Topics Concern  . Not on file   Social History Narrative     Physical Exam: BP 126/74 mmHg  Pulse 96  Ht 5\' 4"  (1.626 m)  Wt 143 lb 6 oz (65.034 kg)  BMI 24.60 kg/m2 Constitutional: generally Sedated appearing Psychiatric: alert and oriented x3 Abdomen: soft, nontender, nondistended, no obvious ascites, no peritoneal signs, normal bowel sounds   Assessment and plan: 60 y.o. female with recent gastric ulcers, anemia  She never resumed her Plavix as she had been instructed to I recommended that she do so today. She will stay on Protonix twice daily. I recommended repeat EGD in about a month's time to check for gastric ulcer healing and we will schedule that for her. At the same time I think getting her up-to-date on colon cancer screening with a colonoscopy is a good idea since she was also found to be anemic. She seemed quite sedated here in the office today, has a history of polysubstance abuse. Today she declined using illicit drugs and so did her care partner. I'm not sure if I believe that however.

## 2015-08-13 NOTE — Op Note (Signed)
[facility] Patient Name: [name] Procedure Date: 08/13/2015 MRN: [id number] Attending MD: Rachael Fee , MD Date of Birth: 1955/11/04 CSN: [id number] Age: 60 Admit Type: Outpatient Procedure:                Upper GI endoscopy Indications:              Follow-up of gastric ulcer noted 05/2015 EGD Providers:                Rachael Fee, MD, Anthony Sar, RN, Beryle Beams, Technician, Randon Goldsmith, CRNA, Clearnce Sorrel, Technician Referring MD:              Medicines:                Monitored Anesthesia Care Complications:            No immediate complications. Estimated blood loss:                            None. Estimated Blood Loss:     Estimated blood loss: none. Procedure:                Pre-Anesthesia Assessment:                           - Prior to the procedure, a History and Physical                            was performed, and patient medications and                            allergies were reviewed. The patient's tolerance of                            previous anesthesia was also reviewed. The risks                            and benefits of the procedure and the sedation                            options and risks were discussed with the patient.                            All questions were answered, and informed consent                            was obtained. Prior Anticoagulants: The patient has                            taken no previous anticoagulant or antiplatelet  agents. ASA Grade Assessment: III - A patient with                            severe systemic disease. After reviewing the risks                            and benefits, the patient was deemed in                            satisfactory condition to undergo the procedure.                           - Prior to the procedure, a History and Physical                            was performed, and  patient medications and                            allergies were reviewed. The patient's tolerance of                            previous anesthesia was also reviewed. The risks                            and benefits of the procedure and the sedation                            options and risks were discussed with the patient.                            All questions were answered, and informed consent                            was obtained. Prior Anticoagulants: The patient has                            taken no previous anticoagulant or antiplatelet                            agents. ASA Grade Assessment: III - A patient with                            severe systemic disease. After reviewing the risks                            and benefits, the patient was deemed in                            satisfactory condition to undergo the procedure.                           After obtaining informed consent, the endoscope was  passed under direct vision. Throughout the                            procedure, the patient's blood pressure, pulse, and                            oxygen saturations were monitored continuously. The                            [id number]) scope was introduced through the                            mouth, and advanced to the second part of duodenum.                            The upper GI endoscopy was accomplished without                            difficulty. The patient tolerated the procedure                            well. Scope In: Scope Out: Findings:      The esophagus was normal.      Diffuse mild inflammation characterized by erythema was found in the       gastric antrum. The previously noted ulcers have healed.      The examined duodenum was normal. Impression:               - Normal esophagus.                           - Gastritis. The previously noted ulcers have                            healed.                            - Normal examined duodenum.                           - No specimens collected. Moderate Sedation:      N/A- Per Anesthesia Care Recommendation:           - Patient has a contact number available for                            emergencies. The signs and symptoms of potential                            delayed complications were discussed with the                            patient. Return to normal activities tomorrow.                            Written discharge instructions were provided to the  patient.                           - Resume previous diet.                           - Continue present medications.                           - No repeat upper endoscopy. Procedure Code(s):        --- Professional ---                           [phone], Esophagogastroduodenoscopy, flexible,                            transoral; diagnostic, including collection of                            specimen(s) by brushing or washing, when performed                            (separate procedure) Diagnosis Code(s):        --- Professional ---                           K29.70, Gastritis, unspecified, without bleeding                           K25.9, Gastric ulcer, unspecified as acute or                            chronic, without hemorrhage or perforation CPT copyright 2016 American Medical Association. All rights reserved. The codes documented in this report are preliminary and upon coder review may  be revised to meet current compliance requirements. Rachael Fee, MD 08/13/2015 12:26:43 PM This report has been signed electronically. Number of Addenda: 0

## 2015-08-13 NOTE — Anesthesia Postprocedure Evaluation (Signed)
Anesthesia Post Note  Patient: [name]  Procedure(s) Performed: Procedure(s) (LRB): COLONOSCOPY WITH PROPOFOL (N/A) ESOPHAGOGASTRODUODENOSCOPY (EGD) WITH PROPOFOL (N/A)  Patient location during evaluation: Endoscopy Anesthesia Type: MAC Level of consciousness: awake and alert Pain management: pain level controlled Vital Signs Assessment: post-procedure vital signs reviewed and stable Respiratory status: spontaneous breathing, nonlabored ventilation, respiratory function stable and patient connected to nasal cannula oxygen Cardiovascular status: stable and blood pressure returned to baseline Anesthetic complications: no    Last Vitals:  Filed Vitals:   08/13/15 1240 08/13/15 1250  BP: 116/60 138/68  Pulse: 79 78  Temp:    Resp: 19 18    Last Pain: There were no vitals filed for this visit.               Phillips Groutarignan, Allister Lessley

## 2015-08-13 NOTE — Anesthesia Preprocedure Evaluation (Signed)
Anesthesia Evaluation  Patient identified by MRN, date of birth, ID band Patient awake    Reviewed: Allergy & Precautions, NPO status , Patient's Chart, lab work & pertinent test results  Airway Mallampati: III  TM Distance: >3 FB Neck ROM: Full    Dental   Pulmonary asthma , Current Smoker,    breath sounds clear to auscultation       Cardiovascular hypertension, + Peripheral Vascular Disease   Rhythm:Regular Rate:Normal     Neuro/Psych Depression CVA    GI/Hepatic PUD, GERD  ,(+)     substance abuse  alcohol use,   Endo/Other  negative endocrine ROS  Renal/GU negative Renal ROS     Musculoskeletal  (+) Fibromyalgia -  Abdominal   Peds  Hematology  (+) anemia ,   Anesthesia Other Findings   Reproductive/Obstetrics                             Anesthesia Physical  Anesthesia Plan  ASA: III  Anesthesia Plan: MAC   Post-op Pain Management:    Induction: Intravenous  Airway Management Planned: Nasal Cannula and Natural Airway  Additional Equipment:   Intra-op Plan:   Post-operative Plan:   Informed Consent: I have reviewed the patients History and Physical, chart, labs and discussed the procedure including the risks, benefits and alternatives for the proposed anesthesia with the patient or authorized representative who has indicated his/her understanding and acceptance.     Plan Discussed with: CRNA  Anesthesia Plan Comments:         Anesthesia Quick Evaluation

## 2015-08-13 NOTE — Transfer of Care (Signed)
Immediate Anesthesia Transfer of Care Note  Patient: [name]  Procedure(s) Performed: Procedure(s): COLONOSCOPY WITH PROPOFOL (N/A) ESOPHAGOGASTRODUODENOSCOPY (EGD) WITH PROPOFOL (N/A)  Patient Location: PACU  Anesthesia Type:MAC  Level of Consciousness: Patient easily awoken, sedated, comfortable, cooperative, following commands, responds to stimulation.   Airway & Oxygen Therapy: Patient spontaneously breathing, ventilating well, oxygen via simple oxygen mask.  Post-op Assessment: Report given to PACU RN, vital signs reviewed and stable, moving all extremities.   Post vital signs: Reviewed and stable.  Complications: No apparent anesthesia complications

## 2015-08-13 NOTE — Discharge Instructions (Signed)

## 2015-08-13 NOTE — Op Note (Addendum)
[facility] Patient Name: [name] Procedure Date: 08/13/2015 MRN: [id number] Attending MD: Rachael Fee , MD Date of Birth: 12-07-1955 CSN: [id number] Age: 60 Admit Type: Outpatient Procedure:                Colonoscopy Indications:              Screening for colorectal malignant neoplasm Providers:                Rachael Fee, MD, Anthony Sar, RN, Randon Goldsmith, CRNA, Beryle Beams, Technician Referring MD:              Medicines:                Monitored Anesthesia Care Complications:            No immediate complications. Estimated blood loss:                            None. Estimated Blood Loss:     Estimated blood loss: none. Procedure:                Pre-Anesthesia Assessment:                           - Prior to the procedure, a History and Physical                            was performed, and patient medications and                            allergies were reviewed. The patient's tolerance of                            previous anesthesia was also reviewed. The risks                            and benefits of the procedure and the sedation                            options and risks were discussed with the patient.                            All questions were answered, and informed consent                            was obtained. Prior Anticoagulants: The patient has                            taken no previous anticoagulant or antiplatelet                            agents. ASA Grade Assessment: III - A patient with  severe systemic disease. After reviewing the risks                            and benefits, the patient was deemed in                            satisfactory condition to undergo the procedure.                           After obtaining informed consent, the colonoscope                            was passed under direct vision. Throughout the   procedure, the patient's blood pressure, pulse, and                            oxygen saturations were monitored continuously. The                            [id number] ([id number]) scope was introduced through                            the anus and advanced to the the cecum, identified                            by appendiceal orifice and ileocecal valve. The                            colonoscopy was performed without difficulty. The                            patient tolerated the procedure well. The quality                            of the bowel preparation was excellent. The                            ileocecal valve, appendiceal orifice, and rectum                            were photographed. Scope In: 11:55:54 AM Scope Out: 12:11:15 PM Scope Withdrawal Time: 0 hours 5 minutes 59 seconds  Total Procedure Duration: 0 hours 15 minutes 21 seconds  Findings:      The entire examined colon appeared normal on direct and retroflexion       views. Impression:               - The entire examined colon is normal on direct and                            retroflexion views.                           - No specimens collected. Moderate Sedation:      N/A- Per Anesthesia Care Recommendation:           -  Patient has a contact number available for                            emergencies. The signs and symptoms of potential                            delayed complications were discussed with the                            patient. Return to normal activities tomorrow.                            Written discharge instructions were provided to the                            patient.                           - Resume previous diet.                           - Continue present medications.                           - Repeat colonoscopy in 10 years for screening                            purposes. Procedure Code(s):        --- Professional ---                           [id number], Colonoscopy, flexible;  diagnostic, including                            collection of specimen(s) by brushing or washing,                            when performed (separate procedure) Diagnosis Code(s):        --- Professional ---                           Z12.11, Encounter for screening for malignant                            neoplasm of colon CPT copyright 2016 American Medical Association. All rights reserved. The codes documented in this report are preliminary and upon coder review may  be revised to meet current compliance requirements. Rachael Feeaniel P Jacobs, MD 08/13/2015 12:14:45 PM This report has been signed electronically. Number of Addenda: 0

## 2015-08-14 ENCOUNTER — Encounter (HOSPITAL_COMMUNITY): Payer: Self-pay | Admitting: Gastroenterology

## 2015-09-26 ENCOUNTER — Encounter (HOSPITAL_COMMUNITY): Payer: Self-pay | Admitting: Emergency Medicine

## 2015-09-26 ENCOUNTER — Inpatient Hospital Stay (HOSPITAL_COMMUNITY)
Admission: EM | Admit: 2015-09-26 | Discharge: 2015-10-01 | DRG: 065 | Disposition: A | Discharge status: Rehabilitation Facility | Payer: Medicaid Other | Attending: Family Medicine | Admitting: Family Medicine

## 2015-09-26 ENCOUNTER — Emergency Department (HOSPITAL_COMMUNITY): Payer: Medicaid Other

## 2015-09-26 DIAGNOSIS — E785 Hyperlipidemia, unspecified: Secondary | ICD-10-CM | POA: Diagnosis present

## 2015-09-26 DIAGNOSIS — Z79899 Other long term (current) drug therapy: Secondary | ICD-10-CM

## 2015-09-26 DIAGNOSIS — I639 Cerebral infarction, unspecified: Principal | ICD-10-CM | POA: Diagnosis present

## 2015-09-26 DIAGNOSIS — F4310 Post-traumatic stress disorder, unspecified: Secondary | ICD-10-CM | POA: Insufficient documentation

## 2015-09-26 DIAGNOSIS — I69352 Hemiplegia and hemiparesis following cerebral infarction affecting left dominant side: Secondary | ICD-10-CM

## 2015-09-26 DIAGNOSIS — J9811 Atelectasis: Secondary | ICD-10-CM | POA: Diagnosis present

## 2015-09-26 DIAGNOSIS — K760 Fatty (change of) liver, not elsewhere classified: Secondary | ICD-10-CM | POA: Diagnosis present

## 2015-09-26 DIAGNOSIS — Z886 Allergy status to analgesic agent status: Secondary | ICD-10-CM

## 2015-09-26 DIAGNOSIS — R471 Dysarthria and anarthria: Secondary | ICD-10-CM | POA: Diagnosis present

## 2015-09-26 DIAGNOSIS — H548 Legal blindness, as defined in USA: Secondary | ICD-10-CM | POA: Diagnosis present

## 2015-09-26 DIAGNOSIS — F1010 Alcohol abuse, uncomplicated: Secondary | ICD-10-CM | POA: Insufficient documentation

## 2015-09-26 DIAGNOSIS — R29810 Facial weakness: Secondary | ICD-10-CM | POA: Diagnosis present

## 2015-09-26 DIAGNOSIS — K219 Gastro-esophageal reflux disease without esophagitis: Secondary | ICD-10-CM | POA: Diagnosis present

## 2015-09-26 DIAGNOSIS — F17210 Nicotine dependence, cigarettes, uncomplicated: Secondary | ICD-10-CM | POA: Diagnosis present

## 2015-09-26 DIAGNOSIS — F17200 Nicotine dependence, unspecified, uncomplicated: Secondary | ICD-10-CM | POA: Insufficient documentation

## 2015-09-26 DIAGNOSIS — Z823 Family history of stroke: Secondary | ICD-10-CM

## 2015-09-26 DIAGNOSIS — D571 Sickle-cell disease without crisis: Secondary | ICD-10-CM

## 2015-09-26 DIAGNOSIS — M545 Low back pain: Secondary | ICD-10-CM | POA: Diagnosis present

## 2015-09-26 DIAGNOSIS — E782 Mixed hyperlipidemia: Secondary | ICD-10-CM | POA: Diagnosis present

## 2015-09-26 DIAGNOSIS — E876 Hypokalemia: Secondary | ICD-10-CM | POA: Insufficient documentation

## 2015-09-26 DIAGNOSIS — R29705 NIHSS score 5: Secondary | ICD-10-CM | POA: Diagnosis present

## 2015-09-26 DIAGNOSIS — G8929 Other chronic pain: Secondary | ICD-10-CM | POA: Insufficient documentation

## 2015-09-26 DIAGNOSIS — Z91018 Allergy to other foods: Secondary | ICD-10-CM

## 2015-09-26 DIAGNOSIS — Z880 Allergy status to penicillin: Secondary | ICD-10-CM

## 2015-09-26 DIAGNOSIS — F329 Major depressive disorder, single episode, unspecified: Secondary | ICD-10-CM | POA: Diagnosis present

## 2015-09-26 DIAGNOSIS — I10 Essential (primary) hypertension: Secondary | ICD-10-CM | POA: Diagnosis present

## 2015-09-26 DIAGNOSIS — M549 Dorsalgia, unspecified: Secondary | ICD-10-CM

## 2015-09-26 DIAGNOSIS — R531 Weakness: Secondary | ICD-10-CM

## 2015-09-26 HISTORY — DX: Cerebral infarction, unspecified: I63.9

## 2015-09-26 LAB — DIFFERENTIAL
Basophils Absolute: 0 10*3/uL (ref 0.0–0.1)
Basophils Relative: 0 %
EOS PCT: 1 %
Eosinophils Absolute: 0.1 10*3/uL (ref 0.0–0.7)
LYMPHS ABS: 2.7 10*3/uL (ref 0.7–4.0)
LYMPHS PCT: 42 %
MONO ABS: 0.3 10*3/uL (ref 0.1–1.0)
Monocytes Relative: 5 %
Neutro Abs: 3.3 10*3/uL (ref 1.7–7.7)
Neutrophils Relative %: 52 %

## 2015-09-26 LAB — COMPREHENSIVE METABOLIC PANEL
ALK PHOS: 82 U/L (ref 38–126)
ALT: 11 U/L — AB (ref 14–54)
ANION GAP: 9 (ref 5–15)
AST: 20 U/L (ref 15–41)
Albumin: 4.1 g/dL (ref 3.5–5.0)
BILIRUBIN TOTAL: 0.7 mg/dL (ref 0.3–1.2)
BUN: 11 mg/dL (ref 6–20)
CALCIUM: 9.6 mg/dL (ref 8.9–10.3)
CO2: 21 mmol/L — ABNORMAL LOW (ref 22–32)
CREATININE: 1.09 mg/dL — AB (ref 0.44–1.00)
Chloride: 110 mmol/L (ref 101–111)
GFR calc non Af Amer: 53 mL/min — ABNORMAL LOW (ref >60)
GLUCOSE: 124 mg/dL — AB (ref 65–99)
Potassium: 3.3 mmol/L — ABNORMAL LOW (ref 3.5–5.1)
Sodium: 140 mmol/L (ref 135–145)
TOTAL PROTEIN: 7.8 g/dL (ref 6.5–8.1)

## 2015-09-26 LAB — CBC
HEMATOCRIT: 40.3 % (ref 36.0–46.0)
HEMOGLOBIN: 13.1 g/dL (ref 12.0–15.0)
MCH: 30.2 pg (ref 26.0–34.0)
MCHC: 32.5 g/dL (ref 30.0–36.0)
MCV: 92.9 fL (ref 78.0–100.0)
Platelets: 173 10*3/uL (ref 150–400)
RBC: 4.34 MIL/uL (ref 3.87–5.11)
RDW: 13.2 % (ref 11.5–15.5)
WBC: 6.4 10*3/uL (ref 4.0–10.5)

## 2015-09-26 LAB — I-STAT TROPONIN, ED: Troponin i, poc: 0 ng/mL (ref 0.00–0.08)

## 2015-09-26 LAB — I-STAT CHEM 8, ED
BUN: 12 mg/dL (ref 6–20)
CALCIUM ION: 1.17 mmol/L (ref 1.12–1.23)
Chloride: 108 mmol/L (ref 101–111)
Creatinine, Ser: 1 mg/dL (ref 0.44–1.00)
GLUCOSE: 122 mg/dL — AB (ref 65–99)
HCT: 43 % (ref 36.0–46.0)
Hemoglobin: 14.6 g/dL (ref 12.0–15.0)
Potassium: 3.3 mmol/L — ABNORMAL LOW (ref 3.5–5.1)
SODIUM: 146 mmol/L — AB (ref 135–145)
TCO2: 23 mmol/L (ref 0–100)

## 2015-09-26 LAB — APTT: APTT: 29 s (ref 24–37)

## 2015-09-26 LAB — PROTIME-INR
INR: 1.17 (ref 0.00–1.49)
Prothrombin Time: 15.1 seconds (ref 11.6–15.2)

## 2015-09-26 NOTE — ED Notes (Signed)
Roseanne RenoStewart MD (neurologist) at bedside

## 2015-09-26 NOTE — ED Provider Notes (Addendum)
CSN: [id number]     Arrival date & time 09/26/15  2102 History   First MD Initiated Contact with Patient 09/26/15 2123     Chief Complaint  Patient presents with  . Stroke Symptoms     (Consider location/radiation/quality/duration/timing/severity/associated sxs/prior Treatment) Patient is a 60 y.o. female presenting with weakness. The history is provided by the patient (The patient states that around [birth year]28 she started having severe weakness in her left leg in her left arm. This was 10:30 in the morning).  Weakness This is a new problem. The current episode started 6 to 12 hours ago. The problem occurs constantly. The problem has not changed since onset.Pertinent negatives include no chest pain, no abdominal pain and no headaches. Nothing aggravates the symptoms. Nothing relieves the symptoms.    Past Medical History  Diagnosis Date  . Chronic back pain   . MDD (major depressive disorder) (HCC)   . Alcohol abuse   . PTSD (post-traumatic stress disorder)   . UTI (lower urinary tract infection)   . Colitis   . Stroke (HCC)   . Pancreatitis   . Hepatitis     Hepatitis C  . GI bleed   . Gastric ulcer   . Fatty liver   . CVA (cerebral infarction)    Past Surgical History  Procedure Laterality Date  . Esophagogastroduodenoscopy endoscopy    . Esophagogastroduodenoscopy N/A 05/23/2014    Procedure: ESOPHAGOGASTRODUODENOSCOPY (EGD);  Surgeon: Beverley Fiedler, MD;  Location: [facility] ENDOSCOPY;  Service: Endoscopy;  Laterality: N/A;  . Appendectomy    . Gall stone removed    . Abdominal hysterectomy    . Colonoscopy with propofol N/A 08/13/2015    Procedure: COLONOSCOPY WITH PROPOFOL;  Surgeon: Rachael Fee, MD;  Location: [facility] ENDOSCOPY;  Service: Endoscopy;  Laterality: N/A;  . Esophagogastroduodenoscopy (egd) with propofol N/A 08/13/2015    Procedure: ESOPHAGOGASTRODUODENOSCOPY (EGD) WITH PROPOFOL;  Surgeon: Rachael Fee, MD;  Location: [facility] ENDOSCOPY;  Service: Endoscopy;  Laterality:  N/A;   Family History  Problem Relation Age of Onset  . Stroke Mother   . Cancer Sister     type unknown  . Diabetes Father    Social History  Substance Use Topics  . Smoking status: Current Every Day Smoker -- 0.50 packs/day    Types: Cigarettes  . Smokeless tobacco: Former Neurosurgeon    Types: Chew    Quit date: 03/14/1978  . Alcohol Use: No   OB History    No data available     Review of Systems  Constitutional: Negative for appetite change and fatigue.  HENT: Negative for congestion, ear discharge and sinus pressure.   Eyes: Negative for discharge.  Respiratory: Negative for cough.   Cardiovascular: Negative for chest pain.  Gastrointestinal: Negative for abdominal pain and diarrhea.  Genitourinary: Negative for frequency and hematuria.  Musculoskeletal: Negative for back pain.  Skin: Negative for rash.  Neurological: Positive for weakness. Negative for seizures and headaches.       Weakness left arm left leg  Psychiatric/Behavioral: Negative for hallucinations.      Allergies  Banana; Ibuprofen; and Penicillins  Home Medications   Prior to Admission medications   Medication Sig Start Date End Date Taking? Authorizing Provider  albuterol (PROVENTIL HFA;VENTOLIN HFA) 108 (90 BASE) MCG/ACT inhaler Inhale 2 puffs into the lungs every 8 (eight) hours as needed for wheezing or shortness of breath.   Yes Historical Provider, MD  baclofen (LIORESAL) 10 MG tablet Take 10 mg by mouth 2 (  two) times daily.   Yes Historical Provider, MD  cyclobenzaprine (FLEXERIL) 5 MG tablet Take 1 tablet (5 mg total) by mouth 2 (two) times daily. 05/24/14  Yes Desi Rowe Art, DO  cycloSPORINE (RESTASIS) 0.05 % ophthalmic emulsion Place 1 drop into both eyes 2 (two) times daily.   Yes Historical Provider, MD  doxycycline (VIBRAMYCIN) 100 MG capsule Take 100 mg by mouth 2 (two) times daily.   Yes Historical Provider, MD  fluticasone (FLONASE) 50 MCG/ACT nasal spray Place 1 spray into the nose  daily as needed for allergies.  06/04/15  Yes Historical Provider, MD  gabapentin (NEURONTIN) 300 MG capsule Take 300 mg by mouth 3 (three) times daily.  02/17/14  Yes Historical Provider, MD  loratadine (CLARITIN) 10 MG tablet Take 10 mg by mouth daily. 04/03/14  Yes Historical Provider, MD  oxyCODONE-acetaminophen (PERCOCET) 7.5-325 MG per tablet Take 1 tablet by mouth every 4 (four) hours as needed for pain.  05/01/14  Yes Historical Provider, MD  pantoprazole (PROTONIX) 40 MG tablet Take 1 tablet (40 mg total) by mouth 2 (two) times daily before a meal. 05/24/14  Yes Jessica U Vann, DO  rOPINIRole (REQUIP) 0.25 MG tablet Take 0.25 mg by mouth 3 (three) times daily.   Yes Historical Provider, MD  sertraline (ZOLOFT) 50 MG tablet Take 50 mg by mouth daily.   Yes Historical Provider, MD  SIMBRINZA 1-0.2 % SUSP Place 1 drop into both eyes 3 (three) times daily.  05/08/14  Yes Historical Provider, MD  temazepam (RESTORIL) 15 MG capsule Take 15 mg by mouth at bedtime.   Yes Historical Provider, MD  Travoprost, BAK Free, (TRAVATAN) 0.004 % SOLN ophthalmic solution Place 1 drop into both eyes at bedtime.    Yes Historical Provider, MD   BP 146/83 mmHg  Pulse 72  Temp(Src) 98.4 F (36.9 C) (Oral)  Resp 18  Ht 5\' 4"  (1.626 m)  Wt 137 lb (62.143 kg)  BMI 23.50 kg/m2  SpO2 98% Physical Exam  Constitutional: She is oriented to person, place, and time. She appears well-developed.  HENT:  Head: Normocephalic.  Mild left facial drooping  Eyes: Conjunctivae and EOM are normal. No scleral icterus.  Neck: Neck supple. No thyromegaly present.  Cardiovascular: Normal rate and regular rhythm.  Exam reveals no gallop and no friction rub.   No murmur heard. Pulmonary/Chest: No stridor. She has no wheezes. She has no rales. She exhibits no tenderness.  Abdominal: She exhibits no distension. There is no tenderness. There is no rebound.  Musculoskeletal: Normal range of motion. She exhibits no edema.  Patient has  profound weakness in left leg. She can barely lift it 1 inch off the bed. She also has weakness in her left arm that is moderate  Lymphadenopathy:    She has no cervical adenopathy.  Neurological: She is oriented to person, place, and time. She exhibits normal muscle tone. Coordination normal.  Skin: No rash noted. No erythema.  Psychiatric: She has a normal mood and affect. Her behavior is normal.    ED Course  Procedures (including critical care time) Labs Review Labs Reviewed  COMPREHENSIVE METABOLIC PANEL - Abnormal; Notable for the following:    Potassium 3.3 (*)    CO2 21 (*)    Glucose, Bld 124 (*)    Creatinine, Ser 1.09 (*)    ALT 11 (*)    GFR calc non Af Amer 53 (*)    All other components within normal limits  I-STAT CHEM 8,  ED - Abnormal; Notable for the following:    Sodium 146 (*)    Potassium 3.3 (*)    Glucose, Bld 122 (*)    All other components within normal limits  PROTIME-INR  APTT  CBC  DIFFERENTIAL  CBC WITH DIFFERENTIAL/PLATELET  ETHANOL  I-STAT TROPOININ, ED    Imaging Review Ct Head Wo Contrast  09/26/2015  CLINICAL DATA:  60 year old female with left-sided weakness. History of prior stroke. EXAM: CT HEAD WITHOUT CONTRAST TECHNIQUE: Contiguous axial images were obtained from the base of the skull through the vertex without intravenous contrast. COMPARISON:  Head CT dated 05/22/2014 FINDINGS: The ventricles and the sulci are appropriate in size for the patient's age. There is no intracranial hemorrhage. No midline shift or mass effect identified. The gray-white matter differentiation is preserved. There is diffuse mucoperiosteal thickening and partial opacification of the left maxillary sinus. There is old-appearing fracture of the left lamina Propecia. The remainder of the visualized paranasal sinuses and mastoid air cells are clear. The calvarium is intact. IMPRESSION: No acute intracranial pathology. If symptoms persist and there are no  contraindications, MRI may provide better evaluation if clinically indicated These results were called by telephone at the time of interpretation on 09/26/2015 at 11:11 pm to Dr. Bethann BerkshireJOSEPH Tamikka Pilger , who verbally acknowledged these results. Electronically Signed   By: Elgie CollardArash  Radparvar M.D.   On: 09/26/2015 23:13   Dg Chest Port 1 View  09/26/2015  CLINICAL DATA:  Initial evaluation for acute weakness. EXAM: PORTABLE CHEST 1 VIEW COMPARISON:  Prior radiograph from 05/22/2014. FINDINGS: Cardiac and mediastinal silhouettes are within normal limits. Atheromatous plaque within the aortic arch. Lungs are hypoinflated. Patchy and hazy bibasilar opacities, right greater than left, favored to reflect atelectasis. Superimposed infection, particularly at the right lung base could be considered in the correct clinical setting. No pulmonary edema or pleural effusion. No pneumothorax. No acute osseous abnormality. IMPRESSION: 1. Shallow lung inflation with patchy and hazy bibasilar opacities, right greater than left. Atelectasis is favored. Superimposed infection, particularly at the right lung base, could be considered in the correct clinical setting. 2. Aortic atherosclerosis. Electronically Signed   By: Rise MuBenjamin  McClintock M.D.   On: 09/26/2015 22:07   I have personally reviewed and evaluated these images and lab results as part of my medical decision-making.   EKG Interpretation None     CRITICAL CARE Performed by: Alfonso Shackett L Total critical care time: 35 minutes Critical care time was exclusive of separately billable procedures and treating other patients. Critical care was necessary to treat or prevent imminent or life-threatening deterioration. Critical care was time spent personally by me on the following activities: development of treatment plan with patient and/or surrogate as well as nursing, discussions with consultants, evaluation of patient's response to treatment, examination of patient, obtaining  history from patient or surrogate, ordering and performing treatments and interventions, ordering and review of laboratory studies, ordering and review of radiographic studies, pulse oximetry and re-evaluation of patient's condition.   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 Suspect stroke. CT scan does not show any acute stroke. Patient will be admitted by medicine and neurology consult     Bethann BerkshireJoseph Tasharra Nodine, MD 09/26/15 [id number]  Bethann BerkshireJoseph Serenidy Waltz, MD 09/26/15 [id number]

## 2015-09-26 NOTE — ED Notes (Addendum)
CT called per Dr. Pati GalloZammit's request to get patient moved up on list for CT scan. Not a code stroke d/t last seen normal last night/symptoms discovered when patient woke up at 10:30am today. CT states they will move her up after a couple of traumas are scanned. MD aware.

## 2015-09-26 NOTE — ED Notes (Addendum)
Pt to ED with c/o weakness,  Onset approx 2pm today,  Pt has weakness in left arm and left leg, also droop on left side of face.  Pt c/o feeling weak all over,  St's she can not stand.  Pt c/o headache onset 5 mins ago

## 2015-09-26 NOTE — H&P (Signed)
PCP:   Lonia BloodGARBA,LAWAL, MD   Chief Complaint:  Left-sided weakness  HPI: This is a 60 year old female who was last seen normal approximately 10 AM today. She states she developed the severe back and hip pain in the early afternoon. As the day progressed she developed progressive weakness of the leg and began dragging her left leg. She reports no altered mentation but she reports some slurred speech. She denies dysphagia or altered mentation. Her symptoms worsened specifically or lower extremity weakness. Her daughters visited in the evening him and noted the weakness and took her to the ER. Of note the patient does have history of a stroke with residual left-sided weakness, she is not daily aspirin. During my interview the patient had no complaint of back pain  Review of Systems:  The patient denies anorexia, fever, weight loss,, vision loss, decreased hearing, hoarseness, chest pain, syncope, dyspnea on exertion, peripheral edema, balance deficits, hemoptysis, abdominal pain, melena, hematochezia, severe indigestion/heartburn, hematuria, incontinence, genital sores, muscle weakness, suspicious skin lesions, transient blindness, difficulty walking, depression, unusual weight change, abnormal bleeding, enlarged lymph nodes, angioedema, and breast masses.  Past Medical History: Past Medical History  Diagnosis Date  . Chronic back pain   . MDD (major depressive disorder) (HCC)   . Alcohol abuse   . PTSD (post-traumatic stress disorder)   . UTI (lower urinary tract infection)   . Colitis   . Stroke (HCC)   . Pancreatitis   . Hepatitis     Hepatitis C  . GI bleed   . Gastric ulcer   . Fatty liver   . CVA (cerebral infarction)    Past Surgical History  Procedure Laterality Date  . Esophagogastroduodenoscopy endoscopy    . Esophagogastroduodenoscopy N/A 05/23/2014    Procedure: ESOPHAGOGASTRODUODENOSCOPY (EGD);  Surgeon: Beverley FiedlerJay M Pyrtle, MD;  [facility] END[facility]COPY;  Service: Endoscopy;   Laterality: N/A;  . Appendectomy    . Gall stone removed    . Abdominal hysterectomy    . Colonoscopy with propofol N/A 08/13/2015    Procedure: COLONOSCOPY WITH PROPOFOL;  Surgeon: Rachael Feeaniel P Jacobs, MD;  Location: [facility] ENDOSCOPY;  Service: Endoscopy;  Laterality: N/A;  . Esophagogastroduodenoscopy (egd) with propofol N/A 08/13/2015    Procedure: ESOPHAGOGASTRODUODENOSCOPY (EGD) WITH PROPOFOL;  Surgeon: Rachael Feeaniel P Jacobs, MD;  Location: [facility] ENDOSCOPY;  Service: Endoscopy;  Laterality: N/A;    Medications: Prior to Admission medications   Medication Sig Start Date End Date Taking? Authorizing Provider  albuterol (PROVENTIL HFA;VENTOLIN HFA) 108 (90 BASE) MCG/ACT inhaler Inhale 2 puffs into the lungs every 8 (eight) hours as needed for wheezing or shortness of breath.   Yes Historical Provider, MD  baclofen (LIORESAL) 10 MG tablet Take 10 mg by mouth 2 (two) times daily.   Yes Historical Provider, MD  cyclobenzaprine (FLEXERIL) 5 MG tablet Take 1 tablet (5 mg total) by mouth 2 (two) times daily. 05/24/14  Yes Joseph ArtJessica U Vann, DO  cycloSPORINE (RESTASIS) 0.05 % ophthalmic emulsion Place 1 drop into both eyes 2 (two) times daily.   Yes Historical Provider, MD  doxycycline (VIBRAMYCIN) 100 MG capsule Take 100 mg by mouth 2 (two) times daily.   Yes Historical Provider, MD  fluticasone (FLONASE) 50 MCG/ACT nasal spray Place 1 spray into the nose daily as needed for allergies.  06/04/15  Yes Historical Provider, MD  gabapentin (NEURONTIN) 300 MG capsule Take 300 mg by mouth 3 (three) times daily.  02/17/14  Yes Historical Provider, MD  loratadine (CLARITIN) 10 MG tablet Take 10 mg  by mouth daily. 04/03/14  Yes Historical Provider, MD  oxyCODONE-acetaminophen (PERCOCET) 7.5-325 MG per tablet Take 1 tablet by mouth every 4 (four) hours as needed for pain.  05/01/14  Yes Historical Provider, MD  pantoprazole (PROTONIX) 40 MG tablet Take 1 tablet (40 mg total) by mouth 2 (two) times daily before a meal. 05/24/14  Yes  Jessica U Vann, DO  rOPINIRole (REQUIP) 0.25 MG tablet Take 0.25 mg by mouth 3 (three) times daily.   Yes Historical Provider, MD  sertraline (ZOLOFT) 50 MG tablet Take 50 mg by mouth daily.   Yes Historical Provider, MD  SIMBRINZA 1-0.2 % SUSP Place 1 drop into both eyes 3 (three) times daily.  05/08/14  Yes Historical Provider, MD  temazepam (RESTORIL) 15 MG capsule Take 15 mg by mouth at bedtime.   Yes Historical Provider, MD  Travoprost, BAK Free, (TRAVATAN) 0.004 % SOLN ophthalmic solution Place 1 drop into both eyes at bedtime.    Yes Historical Provider, MD    Allergies:   Allergies  Allergen Reactions  . Banana Other (See Comments)    Welts on skin and sores in mouth  . Ibuprofen Other (See Comments)    blisters  . Penicillins Other (See Comments)    Blister Has patient had a PCN reaction causing immediate rash, facial/tongue/throat swelling, SOB or lightheadedness with hypotension: no Has patient had a PCN reaction causing severe rash involving mucus membranes or skin necrosis: yes - blisters Has patient had a PCN reaction that required hospitalization no Has patient had a PCN reaction occurring within the last 10 years: no If all of the above answers are "NO", then may proceed with Cephalosporin use.     Social History:  reports that she has been smoking Cigarettes.  She has been smoking about 0.50 packs per day. She quit smokeless tobacco use about 37 years ago. Her smokeless tobacco use included Chew. She reports that she does not drink alcohol or use illicit drugs.  Family History: Family History  Problem Relation Age of Onset  . Stroke Mother   . Cancer Sister     type unknown  . Diabetes Father     Physical Exam: Filed Vitals:   09/26/15 2145 09/26/15 2200 09/26/15 2215 09/26/15 2328  BP: 135/72 132/79 147/83 146/83  Pulse: 76 74 75 72  Temp:      TempSrc:    Oral  Resp: 17 21 20 18   Height:      Weight:      SpO2: 97% 98% 98% 98%    General:  Alert  and oriented times three, well developed and nourished, no acute distress Eyes: PERRLA, pink conjunctiva, no scleral icterus ENT: Moist oral mucosa, neck supple, no thyromegaly Lungs: clear to ascultation, no wheeze, no crackles, no use of accessory muscles Cardiovascular: regular rate and rhythm, no regurgitation, no gallops, no murmurs. No carotid bruits, no JVD Abdomen: soft, positive BS, non-tender, non-distended, no organomegaly, not an acute abdomen GU: not examined Neuro: CN II - XII grossly intact, sensation intact Musculoskeletal: strength 4/5 Left lower extremity,4.5/5 left upper extremity, decreased sensation on both left upper and lower extremity, no clubbing, cyanosis or edema Skin: no rash, no subcutaneous crepitation, no decubitus Psych: appropriate patient   Labs on Admission:   Recent Labs  09/26/15 2129 09/26/15 2156  NA 140 146*  K 3.3* 3.3*  CL 110 108  CO2 21*  --   GLUCOSE 124* 122*  BUN 11 12  CREATININE 1.09* 1.00  CALCIUM 9.6  --     Recent Labs  09/26/15 2129  AST 20  ALT 11*  ALKPHOS 82  BILITOT 0.7  PROT 7.8  ALBUMIN 4.1   No results for input(s): LIPASE, AMYLASE in the last 72 hours.  Recent Labs  09/26/15 2129 09/26/15 2156  WBC 6.4  --   NEUTROABS 3.3  --   HGB 13.1 14.6  HCT 40.3 43.0  MCV 92.9  --   PLT 173  --    No results for input(s): CKTOTAL, CKMB, CKMBINDEX, TROPONINI in the last 72 hours. Invalid input(s): POCBNP No results for input(s): DDIMER in the last 72 hours. No results for input(s): HGBA1C in the last 72 hours. No results for input(s): CHOL, HDL, LDLCALC, TRIG, CHOLHDL, LDLDIRECT in the last 72 hours. No results for input(s): TSH, T4TOTAL, T3FREE, THYROIDAB in the last 72 hours.  Invalid input(s): FREET3 No results for input(s): VITAMINB12, FOLATE, FERRITIN, TIBC, IRON, RETICCTPCT in the last 72 hours.  Micro Results: No results found for this or any previous visit (from the past 240  hour(s)).   Radiological Exams on Admission: Ct Head Wo Contrast  09/26/2015  CLINICAL DATA:  60 year old female with left-sided weakness. History of prior stroke. EXAM: CT HEAD WITHOUT CONTRAST TECHNIQUE: Contiguous axial images were obtained from the base of the skull through the vertex without intravenous contrast. COMPARISON:  Head CT dated 05/22/2014 FINDINGS: The ventricles and the sulci are appropriate in size for the patient's age. There is no intracranial hemorrhage. No midline shift or mass effect identified. The gray-white matter differentiation is preserved. There is diffuse mucoperiosteal thickening and partial opacification of the left maxillary sinus. There is old-appearing fracture of the left lamina Propecia. The remainder of the visualized paranasal sinuses and mastoid air cells are clear. The calvarium is intact. IMPRESSION: No acute intracranial pathology. If symptoms persist and there are no contraindications, MRI may provide better evaluation if clinically indicated These results were called by telephone at the time of interpretation on 09/26/2015 at 11:11 pm to Dr. Bethann Berkshire , who verbally acknowledged these results. Electronically Signed   By: Elgie Collard M.D.   On: 09/26/2015 23:13   Dg Chest Port 1 View  09/26/2015  CLINICAL DATA:  Initial evaluation for acute weakness. EXAM: PORTABLE CHEST 1 VIEW COMPARISON:  Prior radiograph from 05/22/2014. FINDINGS: Cardiac and mediastinal silhouettes are within normal limits. Atheromatous plaque within the aortic arch. Lungs are hypoinflated. Patchy and hazy bibasilar opacities, right greater than left, favored to reflect atelectasis. Superimposed infection, particularly at the right lung base could be considered in the correct clinical setting. No pulmonary edema or pleural effusion. No pneumothorax. No acute osseous abnormality. IMPRESSION: 1. Shallow lung inflation with patchy and hazy bibasilar opacities, right greater than left.  Atelectasis is favored. Superimposed infection, particularly at the right lung base, could be considered in the correct clinical setting. 2. Aortic atherosclerosis. Electronically Signed   By: Rise Mu M.D.   On: 09/26/2015 22:07    Assessment/Plan Present on Admission:  Left sided weakness -Admit to MedSurg with telemetry -start Aspirin daily -PT/OT. Nursing to bedside swallow eval -Neurochecks for the next 24 hours -MRI, MRA brain, carotid ultrasound, 2-D echo in a.m. -Lipid panel in a.m., started statin -Neurology Dr. Roseanne Reno on board  History of CVA with left-sided weakness -Aware  Chronic back pain -Aware, baclofen continued, Flexeril and Percocet helded. No pain medication for now as this could affect patient's neurochecks  Possible pneumonia -No clinical evidence  of an infection. We'll continue to monitor patient both her shoulders start antibiotics of white count increases or patient becomes hypoxic or febrile  Hypokalemia -Replete in IV  PTSD -Alert, Continue home meds  Legally blind -Aware  Brookie Wayment 09/26/2015, 11:35 PM

## 2015-09-27 ENCOUNTER — Observation Stay (HOSPITAL_COMMUNITY): Payer: Medicaid Other

## 2015-09-27 ENCOUNTER — Observation Stay (HOSPITAL_BASED_OUTPATIENT_CLINIC_OR_DEPARTMENT_OTHER): Payer: Medicaid Other

## 2015-09-27 DIAGNOSIS — R471 Dysarthria and anarthria: Secondary | ICD-10-CM | POA: Diagnosis present

## 2015-09-27 DIAGNOSIS — Z720 Tobacco use: Secondary | ICD-10-CM | POA: Diagnosis not present

## 2015-09-27 DIAGNOSIS — I639 Cerebral infarction, unspecified: Secondary | ICD-10-CM | POA: Diagnosis present

## 2015-09-27 DIAGNOSIS — G8190 Hemiplegia, unspecified affecting unspecified side: Secondary | ICD-10-CM

## 2015-09-27 DIAGNOSIS — R29810 Facial weakness: Secondary | ICD-10-CM | POA: Diagnosis present

## 2015-09-27 DIAGNOSIS — H548 Legal blindness, as defined in USA: Secondary | ICD-10-CM | POA: Diagnosis present

## 2015-09-27 DIAGNOSIS — N3289 Other specified disorders of bladder: Secondary | ICD-10-CM | POA: Diagnosis not present

## 2015-09-27 DIAGNOSIS — K760 Fatty (change of) liver, not elsewhere classified: Secondary | ICD-10-CM | POA: Diagnosis present

## 2015-09-27 DIAGNOSIS — Z79899 Other long term (current) drug therapy: Secondary | ICD-10-CM | POA: Diagnosis not present

## 2015-09-27 DIAGNOSIS — J9811 Atelectasis: Secondary | ICD-10-CM | POA: Diagnosis present

## 2015-09-27 DIAGNOSIS — F1010 Alcohol abuse, uncomplicated: Secondary | ICD-10-CM | POA: Diagnosis not present

## 2015-09-27 DIAGNOSIS — E785 Hyperlipidemia, unspecified: Secondary | ICD-10-CM | POA: Diagnosis present

## 2015-09-27 DIAGNOSIS — G459 Transient cerebral ischemic attack, unspecified: Secondary | ICD-10-CM | POA: Diagnosis not present

## 2015-09-27 DIAGNOSIS — M549 Dorsalgia, unspecified: Secondary | ICD-10-CM | POA: Diagnosis not present

## 2015-09-27 DIAGNOSIS — K219 Gastro-esophageal reflux disease without esophagitis: Secondary | ICD-10-CM | POA: Diagnosis present

## 2015-09-27 DIAGNOSIS — Z880 Allergy status to penicillin: Secondary | ICD-10-CM | POA: Diagnosis not present

## 2015-09-27 DIAGNOSIS — F17210 Nicotine dependence, cigarettes, uncomplicated: Secondary | ICD-10-CM | POA: Diagnosis present

## 2015-09-27 DIAGNOSIS — Z8719 Personal history of other diseases of the digestive system: Secondary | ICD-10-CM | POA: Diagnosis not present

## 2015-09-27 DIAGNOSIS — M25552 Pain in left hip: Secondary | ICD-10-CM | POA: Diagnosis not present

## 2015-09-27 DIAGNOSIS — Z823 Family history of stroke: Secondary | ICD-10-CM | POA: Diagnosis not present

## 2015-09-27 DIAGNOSIS — I63311 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 cerebral artery: Secondary | ICD-10-CM | POA: Diagnosis not present

## 2015-09-27 DIAGNOSIS — Z886 Allergy status to analgesic agent status: Secondary | ICD-10-CM | POA: Diagnosis not present

## 2015-09-27 DIAGNOSIS — Z91018 Allergy to other foods: Secondary | ICD-10-CM | POA: Diagnosis not present

## 2015-09-27 DIAGNOSIS — F4310 Post-traumatic stress disorder, unspecified: Secondary | ICD-10-CM | POA: Diagnosis present

## 2015-09-27 DIAGNOSIS — I10 Essential (primary) hypertension: Secondary | ICD-10-CM | POA: Diagnosis present

## 2015-09-27 DIAGNOSIS — I69352 Hemiplegia and hemiparesis following cerebral infarction affecting left dominant side: Secondary | ICD-10-CM | POA: Diagnosis not present

## 2015-09-27 DIAGNOSIS — G8929 Other chronic pain: Secondary | ICD-10-CM | POA: Diagnosis present

## 2015-09-27 DIAGNOSIS — I6339 Cerebral infarction due to thrombosis of other cerebral artery: Secondary | ICD-10-CM | POA: Diagnosis not present

## 2015-09-27 DIAGNOSIS — I6350 Cerebral infarction due to unspecified occlusion or stenosis of unspecified cerebral artery: Secondary | ICD-10-CM | POA: Diagnosis not present

## 2015-09-27 DIAGNOSIS — D571 Sickle-cell disease without crisis: Secondary | ICD-10-CM | POA: Diagnosis present

## 2015-09-27 DIAGNOSIS — R29705 NIHSS score 5: Secondary | ICD-10-CM | POA: Diagnosis present

## 2015-09-27 DIAGNOSIS — F17200 Nicotine dependence, unspecified, uncomplicated: Secondary | ICD-10-CM | POA: Insufficient documentation

## 2015-09-27 DIAGNOSIS — E876 Hypokalemia: Secondary | ICD-10-CM | POA: Diagnosis present

## 2015-09-27 DIAGNOSIS — M6289 Other specified disorders of muscle: Secondary | ICD-10-CM | POA: Diagnosis not present

## 2015-09-27 DIAGNOSIS — M545 Low back pain: Secondary | ICD-10-CM | POA: Diagnosis present

## 2015-09-27 DIAGNOSIS — F329 Major depressive disorder, single episode, unspecified: Secondary | ICD-10-CM | POA: Diagnosis present

## 2015-09-27 DIAGNOSIS — G8194 Hemiplegia, unspecified affecting left nondominant side: Secondary | ICD-10-CM | POA: Diagnosis not present

## 2015-09-27 LAB — CBC
HCT: 40.2 % (ref 36.0–46.0)
HCT: 39.9 % (ref 36.0–46.0)
Hemoglobin: 13.2 g/dL (ref 12.0–15.0)
Hemoglobin: 13.2 g/dL (ref 12.0–15.0)
MCH: 30.7 pg (ref 26.0–34.0)
MCH: 30.6 pg (ref 26.0–34.0)
MCHC: 32.8 g/dL (ref 30.0–36.0)
MCHC: 33.1 g/dL (ref 30.0–36.0)
MCV: 93.5 fL (ref 78.0–100.0)
MCV: 92.6 fL (ref 78.0–100.0)
PLATELETS: 171 10*3/uL (ref 150–400)
PLATELETS: 162 10*3/uL (ref 150–400)
RBC: 4.3 MIL/uL (ref 3.87–5.11)
RBC: 4.31 MIL/uL (ref 3.87–5.11)
RDW: 13.3 % (ref 11.5–15.5)
RDW: 13.4 % (ref 11.5–15.5)
WBC: 6.6 10*3/uL (ref 4.0–10.5)
WBC: 6 10*3/uL (ref 4.0–10.5)

## 2015-09-27 LAB — BASIC METABOLIC PANEL
Anion gap: 7 (ref 5–15)
BUN: 12 mg/dL (ref 6–20)
CALCIUM: 9.3 mg/dL (ref 8.9–10.3)
CO2: 20 mmol/L — ABNORMAL LOW (ref 22–32)
CREATININE: 0.89 mg/dL (ref 0.44–1.00)
Chloride: 114 mmol/L — ABNORMAL HIGH (ref 101–111)
Glucose, Bld: 89 mg/dL (ref 65–99)
Potassium: 3.5 mmol/L (ref 3.5–5.1)
SODIUM: 141 mmol/L (ref 135–145)

## 2015-09-27 LAB — CREATININE, SERUM
CREATININE: 0.87 mg/dL (ref 0.44–1.00)
GFR calc Af Amer: >60 mL/min (ref >60)

## 2015-09-27 LAB — VAS US CAROTID
LCCAPDIAS: 16 cm/s
LEFT ECA DIAS: -27 cm/s
LEFT VERTEBRAL DIAS: 22 cm/s
LICADDIAS: -33 cm/s
LICAPDIAS: 36 cm/s
LICAPSYS: 101 cm/s
Left CCA dist dias: 23 cm/s
Left CCA dist sys: 72 cm/s
Left CCA prox sys: 95 cm/s
Left ICA dist sys: -88 cm/s
RIGHT ECA DIAS: -17 cm/s
RIGHT VERTEBRAL DIAS: 11 cm/s
Right CCA prox dias: 17 cm/s
Right CCA prox sys: 62 cm/s
Right cca dist sys: -65 cm/s

## 2015-09-27 LAB — LIPID PANEL
Cholesterol: 284 mg/dL — ABNORMAL HIGH (ref 0–200)
HDL: 40 mg/dL — AB (ref >40)
LDL Cholesterol: 215 mg/dL — ABNORMAL HIGH (ref 0–99)
Total CHOL/HDL Ratio: 7.1 RATIO
Triglycerides: 144 mg/dL (ref <150)
VLDL: 29 mg/dL (ref 0–40)

## 2015-09-27 LAB — ETHANOL

## 2015-09-27 LAB — ECHOCARDIOGRAM COMPLETE
Height: 64 in
WEIGHTICAEL: 2185.6 [oz_av]

## 2015-09-27 MED ORDER — SERTRALINE HCL 50 MG PO TABS
50.0000 mg | ORAL_TABLET | Freq: Every day | ORAL | Status: DC
  Administered 2015-09-27 – 2015-10-01 (×5): 50 mg via ORAL
  Filled 2015-09-27 (×5): qty 1

## 2015-09-27 MED ORDER — ASPIRIN 81 MG PO CHEW
81.0000 mg | CHEWABLE_TABLET | Freq: Every day | ORAL | Status: DC
  Administered 2015-09-27 – 2015-09-28 (×2): 81 mg via ORAL
  Filled 2015-09-27 (×2): qty 1

## 2015-09-27 MED ORDER — FAMOTIDINE IN NACL 20-0.9 MG/50ML-% IV SOLN
20.0000 mg | Freq: Two times a day (BID) | INTRAVENOUS | Status: DC
  Administered 2015-09-27 (×2): 20 mg via INTRAVENOUS
  Filled 2015-09-27 (×2): qty 50

## 2015-09-27 MED ORDER — METOPROLOL TARTRATE 5 MG/5ML IV SOLN: 5.0000 mg | INTRAVENOUS | Status: DC | PRN

## 2015-09-27 MED ORDER — CYCLOSPORINE 0.05 % OP EMUL
1.0000 [drp] | Freq: Two times a day (BID) | OPHTHALMIC | Status: DC
  Administered 2015-09-27 – 2015-10-01 (×10): 1 [drp] via OPHTHALMIC
  Filled 2015-09-27 (×11): qty 1

## 2015-09-27 MED ORDER — SENNOSIDES-DOCUSATE SODIUM 8.6-50 MG PO TABS: 1.0000 | ORAL_TABLET | Freq: Every evening | ORAL | Status: DC | PRN

## 2015-09-27 MED ORDER — ENOXAPARIN SODIUM 40 MG/0.4ML ~~LOC~~ SOLN
40.0000 mg | SUBCUTANEOUS | Status: DC
  Administered 2015-09-27 – 2015-10-01 (×5): 40 mg via SUBCUTANEOUS
  Filled 2015-09-27 (×5): qty 0.4

## 2015-09-27 MED ORDER — PANTOPRAZOLE SODIUM 40 MG PO TBEC
40.0000 mg | DELAYED_RELEASE_TABLET | Freq: Two times a day (BID) | ORAL | Status: DC
  Administered 2015-09-27 – 2015-10-01 (×10): 40 mg via ORAL
  Filled 2015-09-27 (×10): qty 1

## 2015-09-27 MED ORDER — GABAPENTIN 300 MG PO CAPS
300.0000 mg | ORAL_CAPSULE | Freq: Three times a day (TID) | ORAL | Status: DC
  Administered 2015-09-27 – 2015-10-01 (×14): 300 mg via ORAL
  Filled 2015-09-27 (×14): qty 1

## 2015-09-27 MED ORDER — ATORVASTATIN CALCIUM 80 MG PO TABS
80.0000 mg | ORAL_TABLET | Freq: Every day | ORAL | Status: DC
  Administered 2015-09-27 – 2015-10-01 (×5): 80 mg via ORAL
  Filled 2015-09-27 (×5): qty 1

## 2015-09-27 MED ORDER — ACETAMINOPHEN 325 MG PO TABS
650.0000 mg | ORAL_TABLET | Freq: Four times a day (QID) | ORAL | Status: DC | PRN
  Administered 2015-09-27 – 2015-10-01 (×2): 650 mg via ORAL
  Filled 2015-09-27 (×2): qty 2

## 2015-09-27 MED ORDER — OXYCODONE-ACETAMINOPHEN 5-325 MG PO TABS
1.0000 | ORAL_TABLET | Freq: Once | ORAL | Status: AC
  Administered 2015-09-27: 1 via ORAL
  Filled 2015-09-27: qty 1

## 2015-09-27 MED ORDER — BACLOFEN 10 MG PO TABS
10.0000 mg | ORAL_TABLET | Freq: Two times a day (BID) | ORAL | Status: DC
  Administered 2015-09-27 – 2015-10-01 (×10): 10 mg via ORAL
  Filled 2015-09-27 (×10): qty 1

## 2015-09-27 MED ORDER — SODIUM CHLORIDE 0.9% FLUSH
3.0000 mL | Freq: Two times a day (BID) | INTRAVENOUS | Status: DC
  Administered 2015-09-27 – 2015-10-01 (×10): 3 mL via INTRAVENOUS

## 2015-09-27 MED ORDER — ROPINIROLE HCL 0.25 MG PO TABS
0.2500 mg | ORAL_TABLET | Freq: Three times a day (TID) | ORAL | Status: DC
  Administered 2015-09-27 – 2015-10-01 (×14): 0.25 mg via ORAL
  Filled 2015-09-27 (×15): qty 1

## 2015-09-27 MED ORDER — CYCLOBENZAPRINE HCL 10 MG PO TABS
5.0000 mg | ORAL_TABLET | Freq: Two times a day (BID) | ORAL | Status: DC
  Administered 2015-09-27 – 2015-10-01 (×10): 5 mg via ORAL
  Filled 2015-09-27 (×10): qty 1

## 2015-09-27 MED ORDER — ONDANSETRON HCL 4 MG/2ML IJ SOLN: 4.0000 mg | Freq: Four times a day (QID) | INTRAMUSCULAR | Status: DC | PRN

## 2015-09-27 MED ORDER — FAMOTIDINE 20 MG PO TABS
20.0000 mg | ORAL_TABLET | Freq: Two times a day (BID) | ORAL | Status: DC
  Administered 2015-09-27 – 2015-10-01 (×8): 20 mg via ORAL
  Filled 2015-09-27 (×8): qty 1

## 2015-09-27 MED ORDER — ACETAMINOPHEN 650 MG RE SUPP: 650.0000 mg | Freq: Four times a day (QID) | RECTAL | Status: DC | PRN

## 2015-09-27 MED ORDER — LATANOPROST 0.005 % OP SOLN
1.0000 [drp] | Freq: Every day | OPHTHALMIC | Status: DC
  Administered 2015-09-27 – 2015-09-30 (×5): 1 [drp] via OPHTHALMIC
  Filled 2015-09-27: qty 2.5

## 2015-09-27 MED ORDER — ONDANSETRON HCL 4 MG PO TABS: 4.0000 mg | ORAL_TABLET | Freq: Four times a day (QID) | ORAL | Status: DC | PRN

## 2015-09-27 MED ORDER — FLUTICASONE PROPIONATE 50 MCG/ACT NA SUSP
1.0000 | Freq: Every day | NASAL | Status: DC
  Administered 2015-09-27 – 2015-09-29 (×2): 1 via NASAL
  Filled 2015-09-27: qty 16

## 2015-09-27 NOTE — Progress Notes (Addendum)
PROGRESS NOTE  [name]  WUJ:[id number] DOB: 1955-12-11  DOA: 09/26/2015 PCP: Lonia Blood, MD   Brief Narrative:  60 year old left-handed female, lives at home with family, independent of activities of daily living, PMH of CVA, HTN, HLD, GI bleed from gastric ulcer, hepatitis C, PTSD, depression, alcohol abuse, tobacco abuse, presented to [facility] ED on 09/26/15 with history of new onset left-sided weakness, lower extremity >upper extremity, slurred speech and facial droop which began at about noontime on day of admission. Admitted for stroke evaluation. Neurology consulted.   Assessment & Plan:   Active Problems:   Left-sided weakness   Acute right brain stroke - Resultant facial droop, dysarthria and left hemiparesis. - CT brain 09/26/15: No acute intracranial pathology. - MRI head 09/27/15:16 mm acute ischemic nonhemorrhagic infarct involving the posterior right coronary data. No associated mass effect. Multiple remote lacunar infarcts involving bilateral external capsule/basal ganglia, bilateral thalami and right paramedian pons. - MRA head 09/27/15: Negative for large vessel occlusion. Atheromatous irregularity with superimposed mild to moderate multifocal stenosis. - Follow up 2-D echo, carotid Dopplers and hemoglobin A1c. - LDL: 215. - Not on antithrombotic spread to admission. Now on aspirin 81 daily. - PT and OT recommend CIR-consulted. - Neurology consultation appreciated. Stroke team follow-up pending.  Hyperlipidemia - LDL 215, goal <70. Started atorvastatin 80 mg daily.  Essential hypertension - Controlled.  Possible 3 mm small aneurysm or tortuosity of the proximal right AICA, seen on MRA head - Outpatient follow-up as deemed necessary.  Chronic back pain - Seems to be controlled.  Depression - Stable. Continue home medications.  Tobacco abuse - Cessation counseled.  Hypokalemia - Replaced.  Atelectasis on chest x-ray - No signs and symptoms suggestive of  pneumonia. Incentive spirometry.  History of GI bleed/GERD - Continue Pepcid.   DVT prophylaxis: Lovenox Code Status: Full Family Communication: Discussed with patient. No family at bedside. Disposition Plan: CIR consultation   Consultants:   Neurology  Procedures:   None  Antimicrobials:   None    Subjective: States no significant change in left-sided weakness. No other complaints reported.  Objective:  Filed Vitals:   09/27/15 0500 09/27/15 0733 09/27/15 0938 09/27/15 1136  BP: 117/68 132/86 101/52 122/79  Pulse: 68 69 62 68  Temp: 98.2 F (36.8 C)  98 F (36.7 C)   TempSrc: Oral  Oral   Resp: 16 16 18 16   Height:      Weight:      SpO2: 96% 99% 95% 96%   No intake or output data in the 24 hours ending 09/27/15 1303 Filed Weights   09/26/15 2117 09/27/15 0102  Weight: 62.143 kg (137 lb) 61.961 kg (136 lb 9.6 oz)    Examination:  General exam: Pleasant middle-aged female sitting up comfortably in chair this morning. OT at bedside. Respiratory system: Clear to auscultation. Respiratory effort normal. Cardiovascular system: S1 & S2 heard, RRR. No JVD, murmurs, rubs, gallops or clicks. No pedal edema. Telemetry: Sinus rhythm. Gastrointestinal system: Abdomen is nondistended, soft and nontender. No organomegaly or masses felt. Normal bowel sounds heard. Central nervous system: Alert and oriented. Mild dysarthria. Facial asymmetry +. Extremities: Grade 5 x 5 power in right limbs. Grade 3 x 5 power in left upper extremity and 2 x 5 power in left lower extremity. Skin: No rashes, lesions or ulcers Psychiatry: Judgement and insight appear normal. Mood & affect flat.     Data Reviewed: I have personally reviewed following labs and imaging studies  CBC:  Recent Labs Lab  09/26/15 2129 09/26/15 2156 09/27/15 0413 09/27/15 0727  WBC 6.4  --  6.6 6.0  NEUTROABS 3.3  --   --   --   HGB 13.1 14.6 13.2 13.2  HCT 40.3 43.0 40.2 39.9  MCV 92.9  --  93.5 92.6    PLT 173  --  171 162   Basic Metabolic Panel:  Recent Labs Lab 09/26/15 2129 09/26/15 2156 09/27/15 0413  NA 140 146* 141  K 3.3* 3.3* 3.5  CL 110 108 114*  CO2 21*  --  20*  GLUCOSE 124* 122* 89  BUN 11 12 12   CREATININE 1.09* 1.00 0.87  0.89  CALCIUM 9.6  --  9.3   GFR: Estimated Creatinine Clearance: 57.9 mL/min (by C-G formula based on Cr of 0.87). Liver Function Tests:  Recent Labs Lab 09/26/15 2129  AST 20  ALT 11*  ALKPHOS 82  BILITOT 0.7  PROT 7.8  ALBUMIN 4.1   No results for input(s): LIPASE, AMYLASE in the last 168 hours. No results for input(s): AMMONIA in the last 168 hours. Coagulation Profile:  Recent Labs Lab 09/26/15 2129  INR 1.17   Cardiac Enzymes: No results for input(s): CKTOTAL, CKMB, CKMBINDEX, TROPONINI in the last 168 hours. BNP (last 3 results) No results for input(s): PROBNP in the last 8760 hours. HbA1C: No results for input(s): HGBA1C in the last 72 hours. CBG: No results for input(s): GLUCAP in the last 168 hours. Lipid Profile:  Recent Labs  09/27/15 0413  CHOL 284*  HDL 40*  LDLCALC 215*  TRIG 144  CHOLHDL 7.1   Thyroid Function Tests: No results for input(s): TSH, T4TOTAL, FREET4, T3FREE, THYROIDAB in the last 72 hours. Anemia Panel: No results for input(s): VITAMINB12, FOLATE, FERRITIN, TIBC, IRON, RETICCTPCT in the last 72 hours.  Sepsis Labs: No results for input(s): PROCALCITON, LATICACIDVEN in the last 168 hours.  No results found for this or any previous visit (from the past 240 hour(s)).       Radiology Studies: Ct Head Wo Contrast  09/26/2015  CLINICAL DATA:  60 year old female with left-sided weakness. History of prior stroke. EXAM: CT HEAD WITHOUT CONTRAST TECHNIQUE: Contiguous axial images were obtained from the base of the skull through the vertex without intravenous contrast. COMPARISON:  Head CT dated 05/22/2014 FINDINGS: The ventricles and the sulci are appropriate in size for the patient's  age. There is no intracranial hemorrhage. No midline shift or mass effect identified. The gray-white matter differentiation is preserved. There is diffuse mucoperiosteal thickening and partial opacification of the left maxillary sinus. There is old-appearing fracture of the left lamina Propecia. The remainder of the visualized paranasal sinuses and mastoid air cells are clear. The calvarium is intact. IMPRESSION: No acute intracranial pathology. If symptoms persist and there are no contraindications, MRI may provide better evaluation if clinically indicated These results were called by telephone at the time of interpretation on 09/26/2015 at 11:11 pm to Dr. Bethann Berkshire , who verbally acknowledged these results. Electronically Signed   By: Elgie Collard M.D.   On: 09/26/2015 23:13   Mr Maxine Glenn Head Wo Contrast  09/27/2015  CLINICAL DATA:  Initial evaluation for acute left-sided weakness. EXAM: MRI HEAD WITHOUT CONTRAST MRA HEAD WITHOUT CONTRAST TECHNIQUE: Multiplanar, multiecho pulse sequences of the brain and surrounding structures were obtained without intravenous contrast. Angiographic images of the head were obtained using MRA technique without contrast. COMPARISON:  Prior CT from 09/26/2015 as well as previous MRI from 10/09/2012. FINDINGS: MRI HEAD FINDINGS Cerebral  volume within normal limits for patient age. Minimal patchy T2/FLAIR hyperintensity within the periventricular and deep white matter, most consistent with chronic small vessel ischemic disease, mild for age. Chronic small vessel ischemic type changes present within the pons is well. Multiple remote lacunar infarcts involving the bilateral external capsule/lentiform nuclei as well as the basal ganglia and thalami. Remote lacunar infarct within the right paramedian pons. No areas of remote cortical infarction appreciated. There is a 16 mm focus of restricted diffusion within the posterior right corona radiata extending inferiorly towards the right  external capsule, consistent with an acute ischemic infarct (series 4, image 31). No associated mass effect or hemorrhage. No other acute infarct. Gray-white matter differentiation otherwise maintained. Major intracranial vascular flow voids are preserved. No acute or chronic intracranial hemorrhage. No mass lesion, midline shift, or mass effect. No hydrocephalus. No extra-axial fluid collection. Major dural sinuses are grossly patent. Craniocervical junction within normal limits mild degenerative disc bulging noted at C2-3 without stenosis. Visualized upper cervical spine otherwise unremarkable. Pituitary gland normal. No acute abnormality about the globes and orbits. Mucosal thickening with fluid level within the left maxillary sinus. Mild scattered mucosal thickening within the ethmoidal air cells and right maxillary sinus. Trace opacity left mastoid air cells. Inner ear structures normal. Bone marrow signal intensity within normal limits. No scalp soft tissue abnormality. MRA HEAD FINDINGS ANTERIOR CIRCULATION: Visualized distal cervical segments of the internal carotid arteries are patent with antegrade flow. Petrous segments widely patent. Mild scattered atheromatous irregularity within the cavernous/ supraclinoid ICAs without focal high-grade stenosis right A1 segment hypoplastic with multiple regularity. Dominant left A1 segment widely patent. The and inferior communicating artery within normal limits. Left anterior cerebral artery widely patent. Right anterior cerebral arteries somewhat diminutive and attenuated but is also patent to its distal aspect. M1 segments patent without up mild atheromatous irregularity within the mid and distal M1 segments without focal high-grade stenosis. MCA bifurcations within normal limits. Multifocal atheromatous irregularity with stenoses involving the MCA branches bilaterally, slightly worse on the left. POSTERIOR CIRCULATION: Left vertebral artery dominant and patent to  the vertebrobasilar junction. Diminutive right vertebral artery terminates in PICA. Multi focal atheromatous irregularity with mild to moderate multi focal narrowing present within the proximal-mid basilar artery. Basilar artery widely patent distally. Superior cerebral arteries patent with multifocal atheromatous irregularity. Both of the posterior cerebral arteries arise from the basilar artery and are patent to their distal aspects. Multifocal atheromatous irregularity with stenoses within the PCAs bilaterally. Small 3 mm focal outpouching arising from the right/posterior aspect of mid basilar artery, potentially at the takeoff of the anterior inferior cerebral artery (series 6, image 88). Finding may reflect a small aneurysm. IMPRESSION: MRI HEAD IMPRESSION: 1. 16 mm acute ischemic nonhemorrhagic infarct involving the posterior right corona radiata. No associated mass effect. 2. Multiple remote lacunar infarcts involving the bilateral external capsules/basal ganglia, bilateral thalami, and the right paramedian pons. 3. Mild chronic small vessel ischemic disease. 4. Acute left maxillary sinusitis. MRA HEAD IMPRESSION: 1. Negative for large vessel occlusion. 2. Atheromatous irregularity with superimposed mild to moderate multi focal stenoses. Overall, these changes have progressed from prior. 3. Extensive atheromatous irregularity with stenoses involving the PCAs bilaterally, also progressed from prior. 4. Moderate multifocal atheromatous irregularity with stenoses involving the MCA branches bilaterally, left greater than right. 5. 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 Attention at  follow-up recommended. Electronically Signed   By: Rise Mu M.D.   On: 09/27/2015 06:58   Mr Brain Wo Contrast  09/27/2015  CLINICAL DATA:  Initial evaluation for acute left-sided  weakness. EXAM: MRI HEAD WITHOUT CONTRAST MRA HEAD WITHOUT CONTRAST TECHNIQUE: Multiplanar, multiecho pulse sequences of the brain and surrounding structures were obtained without intravenous contrast. Angiographic images of the head were obtained using MRA technique without contrast. COMPARISON:  Prior CT from 09/26/2015 as well as previous MRI from 10/09/2012. FINDINGS: MRI HEAD FINDINGS Cerebral volume within normal limits for patient age. Minimal patchy T2/FLAIR hyperintensity within the periventricular and deep white matter, most consistent with chronic small vessel ischemic disease, mild for age. Chronic small vessel ischemic type changes present within the pons is well. Multiple remote lacunar infarcts involving the bilateral external capsule/lentiform nuclei as well as the basal ganglia and thalami. Remote lacunar infarct within the right paramedian pons. No areas of remote cortical infarction appreciated. There is a 16 mm focus of restricted diffusion within the posterior right corona radiata extending inferiorly towards the right external capsule, consistent with an acute ischemic infarct (series 4, image 31). No associated mass effect or hemorrhage. No other acute infarct. Gray-white matter differentiation otherwise maintained. Major intracranial vascular flow voids are preserved. No acute or chronic intracranial hemorrhage. No mass lesion, midline shift, or mass effect. No hydrocephalus. No extra-axial fluid collection. Major dural sinuses are grossly patent. Craniocervical junction within normal limits mild degenerative disc bulging noted at C2-3 without stenosis. Visualized upper cervical spine otherwise unremarkable. Pituitary gland normal. No acute abnormality about the globes and orbits. Mucosal thickening with fluid level within the left maxillary sinus. Mild scattered mucosal thickening within the ethmoidal air cells and right maxillary sinus. Trace opacity left mastoid air cells. Inner ear  structures normal. Bone marrow signal intensity within normal limits. No scalp soft tissue abnormality. MRA HEAD FINDINGS ANTERIOR CIRCULATION: Visualized distal cervical segments of the internal carotid arteries are patent with antegrade flow. Petrous segments widely patent. Mild scattered atheromatous irregularity within the cavernous/ supraclinoid ICAs without focal high-grade stenosis right A1 segment hypoplastic with multiple regularity. Dominant left A1 segment widely patent. The and inferior communicating artery within normal limits. Left anterior cerebral artery widely patent. Right anterior cerebral arteries somewhat diminutive and attenuated but is also patent to its distal aspect. M1 segments patent without up mild atheromatous irregularity within the mid and distal M1 segments without focal high-grade stenosis. MCA bifurcations within normal limits. Multifocal atheromatous irregularity with stenoses involving the MCA branches bilaterally, slightly worse on the left. POSTERIOR CIRCULATION: Left vertebral artery dominant and patent to the vertebrobasilar junction. Diminutive right vertebral artery terminates in PICA. Multi focal atheromatous irregularity with mild to moderate multi focal narrowing present within the proximal-mid basilar artery. Basilar artery widely patent distally. Superior cerebral arteries patent with multifocal atheromatous irregularity. Both of the posterior cerebral arteries arise from the basilar artery and are patent to their distal aspects. Multifocal atheromatous irregularity with stenoses within the PCAs bilaterally. Small 3 mm focal outpouching arising from the right/posterior aspect of mid basilar artery, potentially at the takeoff of the anterior inferior cerebral artery (series 6, image 88). Finding may reflect a small aneurysm. IMPRESSION: MRI HEAD IMPRESSION: 1. 16 mm acute ischemic nonhemorrhagic infarct involving the posterior right corona radiata. No associated mass  effect. 2. Multiple remote lacunar infarcts involving the bilateral external capsules/basal ganglia, bilateral thalami, and the right paramedian pons. 3. Mild chronic small vessel ischemic disease. 4. Acute left maxillary  sinusitis. MRA HEAD IMPRESSION: 1. Negative for large vessel occlusion. 2. Atheromatous irregularity with superimposed mild to moderate multi focal stenoses. Overall, these changes have progressed from prior. 3. Extensive atheromatous irregularity with stenoses involving the PCAs bilaterally, also progressed from prior. 4. Moderate multifocal atheromatous irregularity with stenoses involving the MCA branches bilaterally, left greater than right. 5. 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 Attention at follow-up recommended. Electronically Signed   By: Rise MuBenjamin  McClintock M.D.   On: 09/27/2015 06:58   Dg Chest Port 1 View  09/26/2015  CLINICAL DATA:  Initial evaluation for acute weakness. EXAM: PORTABLE CHEST 1 VIEW COMPARISON:  Prior radiograph from 05/22/2014. FINDINGS: Cardiac and mediastinal silhouettes are within normal limits. Atheromatous plaque within the aortic arch. Lungs are hypoinflated. Patchy and hazy bibasilar opacities, right greater than left, favored to reflect atelectasis. Superimposed infection, particularly at the right lung base could be considered in the correct clinical setting. No pulmonary edema or pleural effusion. No pneumothorax. No acute osseous abnormality. IMPRESSION: 1. Shallow lung inflation with patchy and hazy bibasilar opacities, right greater than left. Atelectasis is favored. Superimposed infection, particularly at the right lung base, could be considered in the correct clinical setting. 2. Aortic atherosclerosis. Electronically Signed   By: Rise MuBenjamin  McClintock M.D.   On: 09/26/2015 22:07        Scheduled Meds: .  aspirin  81 mg Oral Daily  . atorvastatin  80 mg Oral q1800  . baclofen  10 mg Oral BID  . cyclobenzaprine  5 mg Oral BID  . cycloSPORINE  1 drop Both Eyes BID  . enoxaparin (LOVENOX) injection  40 mg Subcutaneous Q24H  . famotidine  20 mg Oral BID  . fluticasone  1 spray Each Nare Daily  . gabapentin  300 mg Oral TID  . latanoprost  1 drop Both Eyes QHS  . pantoprazole  40 mg Oral BID AC  . rOPINIRole  0.25 mg Oral TID  . sertraline  50 mg Oral Daily  . sodium chloride flush  3 mL Intravenous Q12H   Continuous Infusions:       Time spent: 45 minutes.    Union County Surgery Center LLCNGALGI,Toniya Rozar, MD [facility] Pager [phone]  If 7PM-7AM, please contact night-coverage [email or web address] Password [email or web address] 09/27/2015, 1:03 PM

## 2015-09-27 NOTE — Evaluation (Signed)
Occupational Therapy Evaluation Patient Details Name: [name] MRN: [id number] DOB: 03-Jun-1955 Today's Date: 09/27/2015    History of Present Illness [name] is an 59 y.o. female history of depression, PTSD, alcohol abuse, stroke and GI hemorrhage, presenting with new onset left-sided weakness.  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 Currently pt overall mod-max assist for ADLs. Pt declined functional mobility at this time secondary to just ambulating with PT. Pt participated in oral care task with use of red foam to build up handle; required mod assist to complete task. Educated pt on proper positioning of LUE and use of red foam to assist with self feeding. Recommending CIR level therapies to maximize independence and safety with ADLs and functional mobility prior to return home. Pt would benefit from continued skilled OT to address established goals.    Follow Up Recommendations  CIR;Supervision/Assistance - 24 hour    Equipment Recommendations  Other (comment) (TBD at next venue)    Recommendations for Other Services Rehab consult     Precautions / Restrictions Precautions Precautions: Fall Restrictions Weight Bearing Restrictions: No      Mobility Bed Mobility          General bed mobility comments: Pt OOB in chair upon arrival.  Transfers         General transfer comment: Pt declining functional mobility at this time secondary to just ambulating with PT.    Balance Overall balance assessment: Needs assistance Sitting-balance support: Feet supported;Bilateral upper extremity supported Sitting balance-Leahy Scale: Fair Sitting balance - Comments: L lateral lean in sitting despite being proped up in chair. Pt able to correct independently.                              ADL Overall ADL's : Needs  assistance/impaired Eating/Feeding: Moderate assistance;With adaptive utensils;Sitting Eating/Feeding Details (indicate cue type and reason): Simulated by oral care activity. Educated pt on use of red foam with utensils to increase independence with self feeding. Grooming: Moderate assistance;With adaptive equipment;Sitting;Oral care;Wash/dry face Grooming Details (indicate cue type and reason): Pt able to brush teeth with use of red foam to build up handle; used R hand for brushing but attempting to use L hand to assist. Upper Body Bathing: Moderate assistance;Sitting   Lower Body Bathing: Maximal assistance;Sit to/from stand   Upper Body Dressing : Moderate assistance;Sitting   Lower Body Dressing: Maximal assistance;Sit to/from stand                 General ADL Comments: Did not assess transfers at this time; pt just finished ambulating with PT and politely declined due to fatigue. Educated pt on proper positioning of LUE, incorporating LUE into functional activities, use of red foam for increased independence with self feeding and grooming tasks.     Vision Additional Comments: To be further assessed.   Perception     Praxis      Pertinent Vitals/Pain Pain Assessment: Faces Pain Score: 8  Faces Pain Scale: Hurts even more Pain Location: L side Pain Descriptors / Indicators: Sore Pain Intervention(s): Monitored during session     Hand Dominance Left   Extremity/Trunk Assessment Upper Extremity Assessment Upper Extremity Assessment: LUE deficits/detail LUE Deficits / Details: AROM elbow, hand WFL, strength  3+/5 for elbow and hand. Shoulder AAROM grossly WFL, but unable to lift antigravity. Pt reports  no changes in sensation LUE Sensation: decreased light touch LUE Coordination: decreased fine motor;decreased gross motor   Lower Extremity Assessment Lower Extremity Assessment: Defer to PT evaluation        Communication Communication Communication: Expressive  difficulties (dysarthria)   Cognition Arousal/Alertness: Awake/alert Behavior During Therapy: WFL for tasks assessed/performed Overall Cognitive Status: No family/caregiver present to determine baseline cognitive functioning Area of Impairment: Following commands;Problem solving       Following Commands: Follows multi-step commands with increased time     Problem Solving: Slow processing     General Comments       Exercises       Shoulder Instructions      Home Living Family/patient expects to be discharged to:: Private residence Living Arrangements: Other relatives (neices) Available Help at Discharge: Family;Available 24 hours/day Type of Home: House Home Access: Stairs to enter Entergy CorporationEntrance Stairs-Number of Steps: 1   Home Layout: Two level;Bed/bath upstairs Alternate Level Stairs-Number of Steps: 14 Alternate Level Stairs-Rails: Right Bathroom Shower/Tub: Tub/shower unit Shower/tub characteristics: Curtain FirefighterBathroom Toilet: Standard     Home Equipment: Cane - single point;Shower seat          Prior Functioning/Environment Level of Independence: Needs assistance  Gait / Transfers Assistance Needed: reports was independent with ambulation and toileting ADL's / Homemaking Assistance Needed: assist with bathing and dressing per PT eval. Pt reports independent with BADLs during OT eval.        OT Diagnosis: Generalized weakness;Acute pain;Hemiplegia dominant side;Blindness and low vision;Cognitive deficits   OT Problem List: Decreased strength;Decreased range of motion;Decreased activity tolerance;Impaired balance (sitting and/or standing);Impaired vision/perception;Decreased coordination;Decreased cognition;Decreased knowledge of use of DME or AE;Decreased knowledge of precautions;Impaired UE functional use;Pain   OT Treatment/Interventions: Self-care/ADL training;Neuromuscular education;Therapeutic exercise;Energy conservation;DME and/or AE instruction;Therapeutic  activities;Cognitive remediation/compensation;Visual/perceptual remediation/compensation;Patient/family education;Balance training    OT Goals(Current goals can be found in the care plan section) Acute Rehab OT Goals Patient Stated Goal: To go to rehab OT Goal Formulation: With patient Time For Goal Achievement: 10/11/15 Potential to Achieve Goals: Good ADL Goals Pt Will Perform Eating: with adaptive utensils;sitting;with set-up Pt Will Perform Grooming: with min assist;sitting;with adaptive equipment Pt Will Perform Upper Body Bathing: with min assist;sitting Pt Will Perform Lower Body Bathing: with min assist;sit to/from stand Pt Will Transfer to Toilet: with min assist;ambulating;bedside commode Pt Will Perform Toileting - Clothing Manipulation and hygiene: with min assist;sit to/from stand  OT Frequency: Min 2X/week   Barriers to D/C: Inaccessible home environment  bed and bath on 2nd floor       Co-evaluation              End of Session Equipment Utilized During Treatment: Other (comment) (AE: red foam ) Nurse Communication: Other (comment) (red foam in room; needs set up for meals)  Activity Tolerance: Patient limited by fatigue Patient left: in chair;with call bell/phone within reach;with chair alarm set   Time: [id number] OT Time Calculation (min): 23 min Charges:  OT General Charges $OT Visit: 1 Procedure OT Evaluation $OT Eval Moderate Complexity: 1 Procedure OT Treatments $Self Care/Home Management : 8-22 mins G-Codes: OT G-codes **NOT FOR INPATIENT CLASS** Functional Assessment Tool Used: Clinical judgement Functional Limitation: Self care Self Care Current Status ([id number]): At least 40 percent but less than 60 percent impaired, limited or restricted Self Care Goal Status ([id number]): At least 1 percent but less than 20 percent impaired, limited or restricted   Gaye AlkenBailey A Naziya Hegwood M.S., OTR/L Pager: [phone]  09/27/2015, 10:53 AM

## 2015-09-27 NOTE — Progress Notes (Signed)
*  PRELIMINARY RESULTS* Vascular Ultrasound Carotid Duplex (Doppler) has been completed.  Preliminary findings: Bilateral: No significant (1-39%) ICA stenosis. Antegrade vertebral flow.   Farrel DemarkJill Eunice, RDMS, RVT  09/27/2015, 4:29 PM

## 2015-09-27 NOTE — Progress Notes (Signed)
STROKE TEAM PROGRESS NOTE   HISTORY OF PRESENT ILLNESS (per record) [name] is an [age] y.o. female history of depression, PTSD, alcohol abuse, stroke and GI hemorrhage, presenting with new onset left-sided weakness. Onset was at noon today. She noticed sudden onset of weakness involving left arm and leg and onset of slurred speech and left facial droop. She has not been on antiplatelet therapy. CT scan of her head showed no acute intracranial abnormality. NIH stroke score was 5.  LSN: 12:00 noon on 09/26/2015 tPA Given: No: Beyond time window for treatment consideration mRankin:  SUBJECTIVE (INTERVAL HISTORY) Her family members were at the bedside.  Overall she feels her condition is gradually improving. She complains of left sided weakness and otherwise ROS is negative   OBJECTIVE Temp:  [97.9 F (36.6 C)-98.4 F (36.9 C)] 98 F (36.7 C) (07/16 0938) Pulse Rate:  [62-84] 62 (07/16 0938) Cardiac Rhythm:  [-] Normal sinus rhythm (07/16 0713) Resp:  [16-21] 18 (07/16 0938) BP: (101-155)/(52-86) 101/52 mmHg (07/16 0938) SpO2:  [95 %-100 %] 95 % (07/16 0938) Weight:  [61.961 kg (136 lb 9.6 oz)-62.143 kg (137 lb)] 61.961 kg (136 lb 9.6 oz) (07/16 0102)  CBC:  Recent Labs Lab 09/26/15 2129  09/27/15 0413 09/27/15 0727  WBC 6.4  --  6.6 6.0  NEUTROABS 3.3  --   --   --   HGB 13.1  < > 13.2 13.2  HCT 40.3  < > 40.2 39.9  MCV 92.9  --  93.5 92.6  PLT 173  --  171 162  < > = values in this interval not displayed.  Basic Metabolic Panel:  Recent Labs Lab 09/26/15 2129 09/26/15 2156 09/27/15 0413  NA 140 146* 141  K 3.3* 3.3* 3.5  CL 110 108 114*  CO2 21*  --  20*  GLUCOSE 124* 122* 89  BUN 11 12 12   CREATININE 1.09* 1.00 0.87  0.89  CALCIUM 9.6  --  9.3    Lipid Panel:    Component Value Date/Time   CHOL 284* 09/27/2015 0413   TRIG 144 09/27/2015 0413   HDL 40* 09/27/2015 0413   CHOLHDL 7.1 09/27/2015 0413   VLDL 29 09/27/2015 0413   LDLCALC 215* 09/27/2015 0413    HgbA1c:  Lab Results  Component Value Date   HGBA1C 5.2 10/09/2012   Urine Drug Screen:    Component Value Date/Time   LABOPIA NONE DETECTED 05/22/2014 0451   COCAINSCRNUR NONE DETECTED 05/22/2014 0451   LABBENZ NONE DETECTED 05/22/2014 0451   AMPHETMU NONE DETECTED 05/22/2014 0451   THCU NONE DETECTED 05/22/2014 0451   LABBARB NONE DETECTED 05/22/2014 0451      IMAGING  Ct Head Wo Contrast 09/26/2015   No acute intracranial pathology. If symptoms persist and there are no contraindications,    Mr Shirlee LatchMra Head Wo Contrast 09/27/2015    MRI HEAD  1. 16 mm acute ischemic nonhemorrhagic infarct involving the posterior right corona radiata. No associated mass effect.  2. Multiple remote lacunar infarcts involving the bilateral external capsules/basal ganglia, bilateral thalami, and the right paramedian pons.  3. Mild chronic small vessel ischemic disease.  4. Acute left maxillary sinusitis.   MRA HEAD  1. Negative for large vessel occlusion.  2. Atheromatous irregularity with superimposed mild to moderate multi focal stenoses. Overall, these changes have progressed from prior.  3. Extensive atheromatous irregularity with stenoses involving the PCAs bilaterally, also progressed from prior.  4. Moderate multifocal atheromatous irregularity with stenoses involving the  MCA branches bilaterally, left greater than right.  5. 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 Attention at follow-up recommended.     Dg Chest Port 1 View 09/26/2015   1. Shallow lung inflation with patchy and hazy bibasilar opacities, right greater than left. Atelectasis is favored. Superimposed infection, particularly at the right lung base, could be considered in the correct clinical setting. 2. Aortic atherosclerosis.  PHYSICAL EXAM  HEENT- Normocephalic, no lesions, without  obvious abnormality.Neck supple with no masses, nodes, nodules or enlargement. Cardiovascular - regular rate and rhythm, S1, S2 normal, no murmur, click, rub or gallop Lungs - chest clear, no wheezing, rales, normal symmetric air entry Abdomen - soft, non-tender; bowel sounds normal; no masses, no organomegaly Extremities - no joint deformities, effusion, or inflammation and no edema  Neurologic Examination: Mental Status: Alert, oriented, thought content appropriate. Speech slightly slurred without evidence of aphasia. Able to follow commands without difficulty.  Cranial Nerves: II-Visual fields were normal. III/IV/VI-Pupils were equal and reacted normally to light. Extraocular movements were full and conjugate.  V/VII-no facial numbness; moderate left lower facial weakness. VIII-normal. X-mild dysarthria; symmetrical palatal movement. XI: trapezius strength/neck flexion strength normal bilaterally XII-midline tongue extension with normal strength. Motor: Improvement noted in the moderately severe proximal and distal weakness of left upper and lower extremities with flaccid muscle tone; normal strength and tone of right extremities.  Now strength is 4-/5 Sensory: Normal throughout. Cerebellar: Impaired correlation of left upper extremity, commensurate with weakness.   ASSESSMENT/PLAN [name] is a 60 y.o. female with history of depression, PTSD, alcohol abuse, stroke and GI hemorrhage,   presenting with new onset left-sided weakness, slurred speech and left facial droop.  She did not receive IV t-PA due to late presentation.  Stroke:  Non-dominant infarct secondary to small vessel disease source.  Resultant  Left sided weakness  MRI  16 mm acute ischemic nonhemorrhagic infarct involving the posterior right corona radiata.  MRA  - Negative for large vessel occlusion.   Carotid Doppler  pending  2D Echo  pending  LDL 215  HgbA1c pending  VTE prophylaxis -  Lovenox  Diet Heart Room service appropriate?: Yes; Fluid consistency:: Thin  No antithrombotic prior to admission, now on aspirin 81 mg daily  Patient counseled to be compliant with her antithrombotic medications  Ongoing aggressive stroke risk factor management  Therapy recommendations: CIR recommended - consult ordered  Disposition:  Pending  Hypertension  Blood pressure somewhat low at times  Permissive hypertension (OK if < 220/120) but gradually normalize in 5-7 days  Long-term BP goal normotensive  Hyperlipidemia  Home meds: No lipid lowering medications prior to admission.  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 Documentation reflects findings. The laboratory and radiographic studies reviewed by me. ROS completed by me personally and pertinent positives fully  documented  Condition: stable  Assessment and plan completed by me personally and fully documented above. Plans/Recommendations include:     Stroke work-up ongoing  Discussed the LDL with patient and family  Will follow  SIGNED BY: Dr. Sula Soda      To contact Stroke Continuity provider, please refer to [email or web address]. After hours, contact General Neurology

## 2015-09-27 NOTE — Progress Notes (Signed)
Patient complaining of chronic back pain not relieved by tylenol. Provider made aware new order for percocet given.

## 2015-09-27 NOTE — Consult Note (Signed)
Admission H&P    Chief Complaint: New onset left-sided weakness.  HPI: [name] is an 60 y.o. female history of depression, PTSD, alcohol abuse, stroke and GI hemorrhage, presenting with new onset left-sided weakness. Onset was at noon today. She noticed sudden onset of weakness involving left arm and leg and onset of slurred speech and left facial droop. She has not been on antiplatelet therapy. CT scan of her head showed no acute intracranial abnormality. NIH stroke score was 5.  LSN: 12:00 noon on 09/26/2015 tPA Given: No: Beyond time window for treatment consideration mRankin:  Past Medical History  Diagnosis Date  . Chronic back pain   . MDD (major depressive disorder) ([place])   . Alcohol abuse   . PTSD (post-traumatic stress disorder)   . UTI (lower urinary tract infection)   . Colitis   . Stroke ([place])   . Pancreatitis   . Hepatitis     Hepatitis C  . GI bleed   . Gastric ulcer   . Fatty liver   . CVA (cerebral infarction)     Past Surgical History  Procedure Laterality Date  . Esophagogastroduodenoscopy endoscopy    . Esophagogastroduodenoscopy N/A 05/23/2014    Procedure: ESOPHAGOGASTRODUODENOSCOPY (EGD);  Surgeon: Jerene Bears, MD;  Location: [facility] ENDOSCOPY;  Service: Endoscopy;  Laterality: N/A;  . Appendectomy    . Gall stone removed    . Abdominal hysterectomy    . Colonoscopy with propofol N/A 08/13/2015    Procedure: COLONOSCOPY WITH PROPOFOL;  Surgeon: Milus Banister, MD;  Location: [facility] ENDOSCOPY;  Service: Endoscopy;  Laterality: N/A;  . Esophagogastroduodenoscopy (egd) with propofol N/A 08/13/2015    Procedure: ESOPHAGOGASTRODUODENOSCOPY (EGD) WITH PROPOFOL;  Surgeon: Milus Banister, MD;  Location: [facility] ENDOSCOPY;  Service: Endoscopy;  Laterality: N/A;    Family History  Problem Relation Age of Onset  . Stroke Mother   . Cancer Sister     type unknown  . Diabetes Father    Social History:  reports that she has been smoking Cigarettes.  She has been smoking  about 0.50 packs per day. She quit smokeless tobacco use about 37 years ago. Her smokeless tobacco use included Chew. She reports that she does not drink alcohol or use illicit drugs.  Allergies:  Allergies  Allergen Reactions  . Banana Other (See Comments)    Welts on skin and sores in mouth  . Ibuprofen Other (See Comments)    blisters  . Penicillins Other (See Comments)    Blister Has patient had a PCN reaction causing immediate rash, facial/tongue/throat swelling, SOB or lightheadedness with hypotension: no Has patient had a PCN reaction causing severe rash involving mucus membranes or skin necrosis: yes - blisters Has patient had a PCN reaction that required hospitalization no Has patient had a PCN reaction occurring within the last 10 years: no If all of the above answers are "NO", then may proceed with Cephalosporin use.     Medications: Preadmission medications were reviewed by me.  ROS: History obtained from the patient  General ROS: negative for - chills, fatigue, fever, night sweats, weight gain or weight loss Psychological ROS: negative for - behavioral disorder, hallucinations, memory difficulties, mood swings or suicidal ideation Ophthalmic ROS: negative for - blurry vision, double vision, eye pain or loss of vision ENT ROS: negative for - epistaxis, nasal discharge, oral lesions, sore throat, tinnitus or vertigo Allergy and Immunology ROS: negative for - hives or itchy/watery eyes Hematological and Lymphatic ROS: negative for - bleeding  problems, bruising or swollen lymph nodes Endocrine ROS: negative for - galactorrhea, hair pattern changes, polydipsia/polyuria or temperature intolerance Respiratory ROS: negative for - cough, hemoptysis, shortness of breath or wheezing Cardiovascular ROS: negative for - chest pain, dyspnea on exertion, edema or irregular heartbeat Gastrointestinal ROS: negative for - abdominal pain, diarrhea, hematemesis, nausea/vomiting or stool  incontinence Genito-Urinary ROS: negative for - dysuria, hematuria, incontinence or urinary frequency/urgency Musculoskeletal ROS: negative for - joint swelling or muscular weakness Neurological ROS: as noted in HPI Dermatological ROS: negative for rash and skin lesion changes  Physical Examination: Blood pressure 148/72, pulse 72, temperature 98.4 F (36.9 C), temperature source Oral, resp. rate 20, height '5\' 4"'  (1.626 m), weight 62.143 kg (137 lb), SpO2 100 %.  HEENT-  Normocephalic, no lesions, without obvious abnormality.  Normal external eye and conjunctiva.  Normal TM's bilaterally.  Normal auditory canals and external ears. Normal external nose, mucus membranes and septum.  Normal pharynx. Neck supple with no masses, nodes, nodules or enlargement. Cardiovascular - regular rate and rhythm, S1, S2 normal, no murmur, click, rub or gallop Lungs - chest clear, no wheezing, rales, normal symmetric air entry Abdomen - soft, non-tender; bowel sounds normal; no masses,  no organomegaly Extremities - no joint deformities, effusion, or inflammation and no edema  Neurologic Examination: Mental Status: Alert, oriented, thought content appropriate.  Speech slightly slurred without evidence of aphasia. Able to follow commands without difficulty. Cranial Nerves: II-Visual fields were normal. III/IV/VI-Pupils were equal and reacted normally to light. Extraocular movements were full and conjugate.    V/VII-no facial numbness; moderate left lower facial weakness. VIII-normal. X-mild dysarthria; symmetrical palatal movement. XI: trapezius strength/neck flexion strength normal bilaterally XII-midline tongue extension with normal strength. Motor: Moderately severe proximal and distal weakness of left upper and lower extremities with flaccid muscle tone; normal strength and tone of right extremities. Sensory: Normal throughout. Deep Tendon Reflexes: 2+ and symmetric. Plantars: Flexor on the right and  extensor on the left Cerebellar: Impaired correlation of left upper extremity, commensurate with severity weakness. Carotid auscultation: Normal  Results for orders placed or performed during the hospital encounter of 09/26/15 (from the past 48 hour(s))  Protime-INR     Status: None   Collection Time: 09/26/15  9:29 PM  Result Value Ref Range   Prothrombin Time 15.1 11.6 - 15.2 seconds   INR 1.17 0.00 - 1.49  APTT     Status: None   Collection Time: 09/26/15  9:29 PM  Result Value Ref Range   aPTT 29 24 - 37 seconds  CBC     Status: None   Collection Time: 09/26/15  9:29 PM  Result Value Ref Range   WBC 6.4 4.0 - 10.5 K/uL   RBC 4.34 3.87 - 5.11 MIL/uL   Hemoglobin 13.1 12.0 - 15.0 g/dL   HCT 40.3 36.0 - 46.0 %   MCV 92.9 78.0 - 100.0 fL   MCH 30.2 26.0 - 34.0 pg   MCHC 32.5 30.0 - 36.0 g/dL   RDW 13.2 11.5 - 15.5 %   Platelets 173 150 - 400 K/uL  Differential     Status: None   Collection Time: 09/26/15  9:29 PM  Result Value Ref Range   Neutrophils Relative % 52 %   Neutro Abs 3.3 1.7 - 7.7 K/uL   Lymphocytes Relative 42 %   Lymphs Abs 2.7 0.7 - 4.0 K/uL   Monocytes Relative 5 %   Monocytes Absolute 0.3 0.1 - 1.0 K/uL   Eosinophils  Relative 1 %   Eosinophils Absolute 0.1 0.0 - 0.7 K/uL   Basophils Relative 0 %   Basophils Absolute 0.0 0.0 - 0.1 K/uL  Comprehensive metabolic panel     Status: Abnormal   Collection Time: 09/26/15  9:29 PM  Result Value Ref Range   Sodium 140 135 - 145 mmol/L   Potassium 3.3 (L) 3.5 - 5.1 mmol/L   Chloride 110 101 - 111 mmol/L   CO2 21 (L) 22 - 32 mmol/L   Glucose, Bld 124 (H) 65 - 99 mg/dL   BUN 11 6 - 20 mg/dL   Creatinine, Ser 1.09 (H) 0.44 - 1.00 mg/dL   Calcium 9.6 8.9 - 10.3 mg/dL   Total Protein 7.8 6.5 - 8.1 g/dL   Albumin 4.1 3.5 - 5.0 g/dL   AST 20 15 - 41 U/L   ALT 11 (L) 14 - 54 U/L   Alkaline Phosphatase 82 38 - 126 U/L   Total Bilirubin 0.7 0.3 - 1.2 mg/dL   GFR calc non Af Amer 53 (L) >60 mL/min   GFR calc Af  Amer >60 >60 mL/min    Comment: (NOTE) The eGFR has been calculated using the CKD EPI equation. This calculation has not been validated in all clinical situations. eGFR's persistently <60 mL/min signify possible Chronic Kidney Disease.    Anion gap 9 5 - 15  I-stat troponin, ED (not at [facility])     Status: None   Collection Time: 09/26/15  9:38 PM  Result Value Ref Range   Troponin i, poc 0.00 0.00 - 0.08 ng/mL   Comment 3            Comment: Due to the release kinetics of cTnI, a negative result within the first hours of the onset of symptoms does not rule out myocardial infarction with certainty. If myocardial infarction is still suspected, repeat the test at appropriate intervals.   I-stat chem 8, ed     Status: Abnormal   Collection Time: 09/26/15  9:56 PM  Result Value Ref Range   Sodium 146 (H) 135 - 145 mmol/L   Potassium 3.3 (L) 3.5 - 5.1 mmol/L   Chloride 108 101 - 111 mmol/L   BUN 12 6 - 20 mg/dL   Creatinine, Ser 1.00 0.44 - 1.00 mg/dL   Glucose, Bld 122 (H) 65 - 99 mg/dL   Calcium, Ion 1.17 1.12 - 1.23 mmol/L   TCO2 23 0 - 100 mmol/L   Hemoglobin 14.6 12.0 - 15.0 g/dL   HCT 43.0 36.0 - 46.0 %   Ct Head Wo Contrast  09/26/2015  CLINICAL DATA:  60 year old female with left-sided weakness. History of prior stroke. EXAM: CT HEAD WITHOUT CONTRAST TECHNIQUE: Contiguous axial images were obtained from the base of the skull through the vertex without intravenous contrast. COMPARISON:  Head CT dated 05/22/2014 FINDINGS: The ventricles and the sulci are appropriate in size for the patient's age. There is no intracranial hemorrhage. No midline shift or mass effect identified. The gray-white matter differentiation is preserved. There is diffuse mucoperiosteal thickening and partial opacification of the left maxillary sinus. There is old-appearing fracture of the left lamina Propecia. The remainder of the visualized paranasal sinuses and mastoid air cells are clear. The calvarium  is intact. IMPRESSION: No acute intracranial pathology. If symptoms persist and there are no contraindications, MRI may provide better evaluation if clinically indicated These results were called by telephone at the time of interpretation on 09/26/2015 at 11:11 pm to Dr.  JOSEPH ZAMMIT , who verbally acknowledged these results. Electronically Signed   By: Anner Crete M.D.   On: 09/26/2015 23:13   Dg Chest Port 1 View  09/26/2015  CLINICAL DATA:  Initial evaluation for acute weakness. EXAM: PORTABLE CHEST 1 VIEW COMPARISON:  Prior radiograph from 05/22/2014. FINDINGS: Cardiac and mediastinal silhouettes are within normal limits. Atheromatous plaque within the aortic arch. Lungs are hypoinflated. Patchy and hazy bibasilar opacities, right greater than left, favored to reflect atelectasis. Superimposed infection, particularly at the right lung base could be considered in the correct clinical setting. No pulmonary edema or pleural effusion. No pneumothorax. No acute osseous abnormality. IMPRESSION: 1. Shallow lung inflation with patchy and hazy bibasilar opacities, right greater than left. Atelectasis is favored. Superimposed infection, particularly at the right lung base, could be considered in the correct clinical setting. 2. Aortic atherosclerosis. Electronically Signed   By: Jeannine Boga M.D.   On: 09/26/2015 22:07    Assessment: 60 y.o. female with a history of stroke presenting with recurrent ischemic infarction most likely right subcortical MCA territory.  Stroke Risk Factors - family history  Plan: 1. HgbA1c, fasting lipid panel 2. MRI, MRA  of the brain without contrast 3. PT consult, OT consult 4. Echocardiogram 5. Carotid dopplers 6. Prophylactic therapy-Antiplatelet med: Aspirin 81 mg per day 7. Risk factor modification 8. Telemetry monitoring  C.R. Nicole Kindred, MD [facility] [phone]  09/27/2015, 12:15 AM

## 2015-09-27 NOTE — Evaluation (Signed)
Physical Therapy Evaluation Patient Details Name: Alexa Francoislice Larmer MRN: [id number] DOB: 1956/02/06 Today's Date: 09/27/2015   History of Present Illness  [name] is an 60 y.o. female history of depression, PTSD, alcohol abuse, stroke and GI hemorrhage, presenting with new onset left-sided weakness.  Found to have recurrent ischemic infarction most likely right subcortical MCA territory  Clinical Impression  Patient presents with decreased independence with mobility due to deficits listed in PT problem list.  She will benefit from skilled PT in the acute setting to allow return home with family support following CIR level rehab stay.      Follow Up Recommendations CIR    Equipment Recommendations  Rolling walker with 5" wheels    Recommendations for Other Services Rehab consult     Precautions / Restrictions Precautions Precautions: Fall      Mobility  Bed Mobility Overal bed mobility: Needs Assistance Bed Mobility: Supine to Sit     Supine to sit: HOB elevated;Min assist;Mod assist     General bed mobility comments: assist for L LE off bed and to scoot to EOB  Transfers Overall transfer level: Needs assistance Equipment used: Rolling walker (2 wheeled) Transfers: Sit to/from Stand Sit to Stand: Mod assist         General transfer comment: heavy mod lifting help from EOB with leaning L  Ambulation/Gait Ambulation/Gait assistance: Mod assist;Max assist Ambulation Distance (Feet): 15 Feet Assistive device: Rolling walker (2 wheeled) Gait Pattern/deviations: Step-to pattern;Decreased dorsiflexion - left;Decreased stride length;Shuffle     General Gait Details: dragging L foot, assist for weight shift and cues to shift to R, assist for walker propulsion, leaning to L throughout, cues for L LE extension in stance  Stairs            Wheelchair Mobility    Modified Rankin (Stroke Patients Only) Modified Rankin (Stroke Patients Only) Pre-Morbid Rankin Score:  Moderate disability Modified Rankin: Moderately severe disability     Balance Overall balance assessment: Needs assistance   Sitting balance-Leahy Scale: Fair     Standing balance support: Bilateral upper extremity supported Standing balance-Leahy Scale: Poor Standing balance comment: min A with walker for static balance                             Pertinent Vitals/Pain Pain Assessment: 0-10 Pain Score: 8  Pain Location: L flank Pain Descriptors / Indicators: Aching Pain Intervention(s): Monitored during session;Repositioned    Home Living Family/patient expects to be discharged to:: Private residence Living Arrangements: Other relatives (neices) Available Help at Discharge: Family;Available 24 hours/day Type of Home: House Home Access: Stairs to enter   Enter[employer] CorporationEntrance Stairs-Number of Steps: 1 Home Layout: Two level;Bed/bath upstairs Home Equipment: Cane - single point      Prior Function Level of Independence: Needs assistance   Gait / Transfers Assistance Needed: reports was independent with ambulation and toileting  ADL's / Homemaking Assistance Needed: assist with bathing and dressing        Hand Dominance   Dominant Hand: Left    Extremity/Trunk Assessment   Upper Extremity Assessment: LUE deficits/detail       LUE Deficits / Details: AROM elbow, hand WFL, strength grossly 3+/5, shoulder AAROM grossly WFL, but unable to lift antigravity   Lower Extremity Assessment: LLE deficits/detail   LLE Deficits / Details: AAROM stiffness in ankle, otherwise WFL, strength hip flexion 3-/5, knee extension 3+/5, ankle DF 0/5     Communication  Communication: Expressive difficulties (dysarthria)  Cognition Arousal/Alertness: Awake/alert Behavior During Therapy: WFL for tasks assessed/performed Overall Cognitive Status: No family/caregiver present to determine baseline cognitive functioning Area of Impairment: Following commands;Problem solving        Following Commands: Follows multi-step commands with increased time     Problem Solving: Slow processing      General Comments      Exercises        Assessment/Plan    PT Assessment Patient needs continued PT services  PT Diagnosis Hemiplegia dominant side;Abnormality of gait   PT Problem List Decreased strength;Decreased balance;Decreased mobility;Decreased coordination;Impaired sensation;Decreased knowledge of use of DME;Decreased activity tolerance;Pain;Decreased knowledge of precautions;Decreased safety awareness  PT Treatment Interventions DME instruction;Balance training;Gait training;Functional mobility training;Patient/family education;Therapeutic activities;Stair training;Therapeutic exercise;Neuromuscular re-education   PT Goals (Current goals can be found in the Care Plan section) Acute Rehab PT Goals Patient Stated Goal: To go to rehab PT Goal Formulation: With patient Time For Goal Achievement: 10/04/15 Potential to Achieve Goals: Good    Frequency Min 4X/week   Barriers to discharge        Co-evaluation               End of Session Equipment Utilized During Treatment: Gait belt Activity Tolerance: Patient tolerated treatment well Patient left: in chair;with call bell/phone within reach;with chair alarm set Nurse Communication: Mobility status    Functional Assessment Tool Used: Clinical Judgement Functional Limitation: Mobility: Walking and moving around Mobility: Walking and Moving Around Current Status [phone]): At least 40 percent but less than 60 percent impaired, limited or restricted Mobility: Walking and Moving Around Goal Status [phone]): At least 20 percent but less than 40 percent impaired, limited or restricted    Time: 0900-0928 PT Time Calculation (min) (ACUTE ONLY): 28 min   Charges:   PT Evaluation $PT Eval Moderate Complexity: 1 Procedure PT Treatments $Gait Training: 8-22 mins   PT G Codes:   PT G-Codes **NOT FOR INPATIENT  CLASS** Functional Assessment Tool Used: Clinical Judgement Functional Limitation: Mobility: Walking and moving around Mobility: Walking and Moving Around Current Status ([id number]): At least 40 percent but less than 60 percent impaired, limited or restricted Mobility: Walking and Moving Around Goal Status [phone]): At least 20 percent but less than 40 percent impaired, limited or restricted    Elray Mcgregor 09/27/2015, 10:07 AM Sheran Lawless, PT [phone] 09/27/2015

## 2015-09-27 NOTE — Progress Notes (Signed)
Echocardiogram 2D Echocardiogram has been performed.  Dorothey BasemanReel, Gerlad Pelzel M 09/27/2015, 3:40 PM

## 2015-09-27 NOTE — Progress Notes (Signed)
Rehab Admissions Coordinator Note:  Patient was screened by Clois DupesBoyette, Chalmer Zheng Godwin for appropriateness for an Inpatient Acute Rehab Consult per PT and OT recommendation.   At this time, we are recommending Inpatient Rehab consult. Please place order.  Clois DupesBoyette, Akash Winski Godwin 09/27/2015, 10:58 AM  I can be reached at [phone].

## 2015-09-28 DIAGNOSIS — E785 Hyperlipidemia, unspecified: Secondary | ICD-10-CM

## 2015-09-28 DIAGNOSIS — F1010 Alcohol abuse, uncomplicated: Secondary | ICD-10-CM | POA: Insufficient documentation

## 2015-09-28 DIAGNOSIS — M6289 Other specified disorders of muscle: Secondary | ICD-10-CM

## 2015-09-28 DIAGNOSIS — F4310 Post-traumatic stress disorder, unspecified: Secondary | ICD-10-CM

## 2015-09-28 DIAGNOSIS — I10 Essential (primary) hypertension: Secondary | ICD-10-CM

## 2015-09-28 DIAGNOSIS — I63311 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 cerebral artery: Secondary | ICD-10-CM

## 2015-09-28 DIAGNOSIS — Z720 Tobacco use: Secondary | ICD-10-CM

## 2015-09-28 DIAGNOSIS — E876 Hypokalemia: Secondary | ICD-10-CM | POA: Insufficient documentation

## 2015-09-28 DIAGNOSIS — M549 Dorsalgia, unspecified: Secondary | ICD-10-CM

## 2015-09-28 DIAGNOSIS — I639 Cerebral infarction, unspecified: Principal | ICD-10-CM

## 2015-09-28 DIAGNOSIS — G8929 Other chronic pain: Secondary | ICD-10-CM | POA: Insufficient documentation

## 2015-09-28 DIAGNOSIS — K219 Gastro-esophageal reflux disease without esophagitis: Secondary | ICD-10-CM | POA: Insufficient documentation

## 2015-09-28 LAB — HEMOGLOBIN A1C
Hgb A1c MFr Bld: 5.3 % (ref 4.8–5.6)
MEAN PLASMA GLUCOSE: 105 mg/dL

## 2015-09-28 MED ORDER — OXYCODONE-ACETAMINOPHEN 7.5-325 MG PO TABS
1.0000 | ORAL_TABLET | ORAL | Status: DC | PRN
  Administered 2015-09-28 – 2015-10-01 (×13): 1 via ORAL
  Filled 2015-09-28 (×14): qty 1

## 2015-09-28 MED ORDER — CLOPIDOGREL BISULFATE 75 MG PO TABS
75.0000 mg | ORAL_TABLET | Freq: Every day | ORAL | Status: DC
  Administered 2015-09-29 – 2015-10-01 (×3): 75 mg via ORAL
  Filled 2015-09-28 (×3): qty 1

## 2015-09-28 MED ORDER — CLOPIDOGREL BISULFATE 75 MG PO TABS: 75.0000 mg | ORAL_TABLET | Freq: Every day | ORAL | Status: DC

## 2015-09-28 NOTE — Progress Notes (Addendum)
I met with the patient and spoke with her niece Bettina Gavia over speaker phone.  I shared information about the CIR program and left informational booklets.  I was unable to reach Graylon Gunning, another niece who lives in the home.  Gordy Clement states she will reach out to [facility] so we can have a discussion regarding family support following a potential rehab admission.  I will follow up tomorrow for timing of medical readiness and bed availability and pending caregiver support.   Please call if questions.  [place] Admissions Coordinator Cell [phone] Office [phone]

## 2015-09-28 NOTE — Progress Notes (Signed)
Advance direct copy in chart. Original given back to pt's family.   Sim BoastHavy, RN

## 2015-09-28 NOTE — Progress Notes (Signed)
STROKE TEAM PROGRESS NOTE   SUBJECTIVE (INTERVAL HISTORY) Her family members were not at the bedside.  Overall she feels her condition is gradually improving. She still has left sided weakness. She was on ASA in the past but took off due to GIB secondary to ulcer.    OBJECTIVE Temp:  [98.2 F (36.8 C)-98.8 F (37.1 C)] 98.2 F (36.8 C) (07/17 0935) Pulse Rate:  [67-72] 71 (07/17 0935) Cardiac Rhythm:  [-] Normal sinus rhythm (07/17 0700) Resp:  [16-20] 16 (07/17 0935) BP: (102-140)/(51-77) 102/51 mmHg (07/17 0935) SpO2:  [97 %-100 %] 97 % (07/17 0935)  CBC:  Recent Labs Lab 09/26/15 2129  09/27/15 0413 09/27/15 0727  WBC 6.4  --  6.6 6.0  NEUTROABS 3.3  --   --   --   HGB 13.1  < > 13.2 13.2  HCT 40.3  < > 40.2 39.9  MCV 92.9  --  93.5 92.6  PLT 173  --  171 162  < > = values in this interval not displayed.  Basic Metabolic Panel:   Recent Labs Lab 09/26/15 2129 09/26/15 2156 09/27/15 0413  NA 140 146* 141  K 3.3* 3.3* 3.5  CL 110 108 114*  CO2 21*  --  20*  GLUCOSE 124* 122* 89  BUN 11 12 12   CREATININE 1.09* 1.00 0.87  0.89  CALCIUM 9.6  --  9.3    Lipid Panel:     Component Value Date/Time   CHOL 284* 09/27/2015 0413   TRIG 144 09/27/2015 0413   HDL 40* 09/27/2015 0413   CHOLHDL 7.1 09/27/2015 0413   VLDL 29 09/27/2015 0413   LDLCALC 215* 09/27/2015 0413   HgbA1c:  Lab Results  Component Value Date   HGBA1C 5.3 09/27/2015   Urine Drug Screen:     Component Value Date/Time   LABOPIA NONE DETECTED 05/22/2014 0451   COCAINSCRNUR NONE DETECTED 05/22/2014 0451   LABBENZ NONE DETECTED 05/22/2014 0451   AMPHETMU NONE DETECTED 05/22/2014 0451   THCU NONE DETECTED 05/22/2014 0451   LABBARB NONE DETECTED 05/22/2014 0451      IMAGING  Ct Head Wo Contrast 09/26/2015   No acute intracranial pathology. If symptoms persist and there are no contraindications,   Mri and Mra Head Wo Contrast 09/27/2015   MRI HEAD  1. 16 mm acute ischemic  nonhemorrhagic infarct involving the posterior right corona radiata. No associated mass effect.  2. Multiple remote lacunar infarcts involving the bilateral external capsules/basal ganglia, bilateral thalami, and the right paramedian pons.  3. Mild chronic small vessel ischemic disease.  4. Acute left maxillary sinusitis.   MRA HEAD  1. Negative for large vessel occlusion.  2. Atheromatous irregularity with superimposed mild to moderate multi focal stenoses. Overall, these changes have progressed from prior.  3. Extensive atheromatous irregularity with stenoses involving the PCAs bilaterally, also progressed from prior.  4. Moderate multifocal atheromatous irregularity with stenoses involving the MCA branches bilaterally, left greater than right.  5. 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 Attention at follow-up recommended.   Dg Chest Port 1 View 09/26/2015   1. Shallow lung inflation with patchy and hazy bibasilar opacities, right greater than left. Atelectasis is favored. Superimposed infection, particularly at the right lung base, could be considered in the correct clinical setting. 2. Aortic atherosclerosis.  CUS - Bilateral: 1-39% ICA stenosis. Vertebral artery  flow is antegrade.  TTE - - Normal LV size with EF 55%. Moderate diastolic dysfunction.  Normal RV size and systolic function. No significant valvular  abnormalities.   PHYSICAL EXAM  HEENT- Normocephalic, no lesions, without obvious abnormality.Neck supple with no masses, nodes, nodules or enlargement. Cardiovascular - regular rate and rhythm, S1, S2 normal, no murmur, click, rub or gallop Lungs - chest clear, no wheezing, rales, normal symmetric air entry Abdomen - soft, non-tender; bowel sounds normal; no masses, no organomegaly Extremities - no joint deformities, effusion, or  inflammation and no edema  Neurologic Examination: Mental Status: Alert, oriented, thought content appropriate. Speech slightly slurred without evidence of aphasia. Able to follow commands without difficulty.  Cranial Nerves: II-Visual fields were normal. III/IV/VI-Pupils were equal and reacted normally to light. Extraocular movements were full and conjugate.  V/VII-no facial numbness; moderate left lower facial weakness. VIII-normal. X-mild dysarthria; symmetrical palatal movement. XI: trapezius strength/neck flexion strength normal bilaterally XII-midline tongue extension with normal strength. Motor: left UE 3/5 adn LLE proximal 2/5 and knee extension 3-/5 and distal 3-/5 Sensory: Normal throughout. Cerebellar: Impaired correlation of left upper extremity, commensurate with weakness.   ASSESSMENT/PLAN [name] is a 60 y.o. female with history of depression, PTSD, alcohol abuse, stroke and GI hemorrhage,   presenting with new onset left-sided weakness, slurred speech and left facial droop.  She did not receive IV t-PA due to late presentation.  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  Resultant  Left sided weakness and left facial droop  MRI  Right CR infarct. Old BG, thalamus and pontine infarcts  MRA  - Negative for large vessel occlusion.   Carotid Doppler  unremarkable  2D Echo  EF 55%  LDL 215  HgbA1c 5.3  VTE prophylaxis - Lovenox Diet Heart Room service appropriate?: Yes; Fluid consistency:: Thin  No antithrombotic prior to admission, now on aspirin 81 mg daily. She was on ASA in the past, but took off due to GIB caused by gastric ulcer. Therefore, will recommend to switch to plavix for stroke prevention.   Patient counseled to be compliant with her antithrombotic medications  Ongoing aggressive stroke risk factor management  Therapy recommendations: CIR  Disposition:  Pending  Hypertension  Blood pressure somewhat  low at times Permissive hypertension (OK if < 220/120) but gradually normalize in 5-7 days Long-term BP goal normotensive  Hyperlipidemia  Home meds: No lipid lowering medications prior to admission.  LDL 215, goal < 70  Add Lipitor 80 mg daily  Continue statin at discharge  Tobacco abuse  Current smoker  Smoking cessation counseling provided  Pt is willing to quit  Other Stroke Risk Factors  Advanced age  ETOH use, advised to drink no more than 1 - 2 drink(s) a day  Hx stroke/TIA on MRI  Family hx stroke (mother)  Other Active Problems    Hospital day # 1  Neurology will sign off. Please call with questions. Pt will follow up with Dr. Roda Shutters at [facility] in about 2 months. Thanks for the consult.  Marvel Plan, MD PhD Stroke Neurology 09/28/2015 2:39 PM   To contact Stroke Continuity provider, please refer to [email or web address]. After hours, contact General Neurology

## 2015-09-28 NOTE — Progress Notes (Signed)
Physical Therapy Treatment Patient Details Name: [name] MRN: [id number] DOB: Aug 01, 1955 Today's Date: 09/28/2015    History of Present Illness Alexa Francoislice Vallely is an 60 y.o. female history of depression, PTSD, alcohol abuse, stroke and GI hemorrhage, presenting with new onset left-sided weakness.  Found to have recurrent ischemic infarction most likely right subcortical MCA territory    PT Comments    Patient progressing with ambulation distance/increased trials this session.  Remains with body spatial awareness deficits, L weakness and decreased balance impacting mobility.  She will benefit from skilled PT following while in acute setting and for CIR level rehab at d/c.  Follow Up Recommendations  CIR     Equipment Recommendations  Rolling walker with 5" wheels    Recommendations for Other Services       Precautions / Restrictions Precautions Precautions: Fall    Mobility  Bed Mobility               General bed mobility comments: Pt OOB in chair upon arrival.  Transfers Overall transfer level: Needs assistance Equipment used: Rolling walker (2 wheeled) Transfers: Sit to/from Stand Sit to Stand: Mod assist         General transfer comment: increased time even after set up cues for L LE placement and hand placement, slow to rise with weakness on L  Ambulation/Gait Ambulation/Gait assistance: Max assist;Mod assist Ambulation Distance (Feet): 15 Feet (x 2) Assistive device: Rolling walker (2 wheeled) Gait Pattern/deviations: Step-to pattern;Decreased stride length;Wide base of support;Shuffle;Ataxic;Decreased dorsiflexion - left     General Gait Details: placing R LE almost outside of walker, cues for more central placement, assist for weight shift to R and cues/facilitation for L LE extension in stance; walked from chair around to sit on opposite side of bed, then back to chair   Stairs            Wheelchair Mobility    Modified Rankin (Stroke  Patients Only)       Balance Overall balance assessment: Needs assistance   Sitting balance-Leahy Scale: Fair Sitting balance - Comments: sitting kateral weight shifts with facilitation in L ribs for L trunk activation and R weight shift and anterior pelvic tilt; also worked some in chair and on bed on reciprocal scooting with assist   Standing balance support: Bilateral upper extremity supported Standing balance-Leahy Scale: Poor Standing balance comment: min A with walker for static balance                    Cognition Arousal/Alertness: Awake/alert Behavior During Therapy: WFL for tasks assessed/performed Overall Cognitive Status: No family/caregiver present to determine baseline cognitive functioning         Following Commands: Follows multi-step commands with increased time     Problem Solving: Slow processing      Exercises      General Comments        Pertinent Vitals/Pain Faces Pain Scale: Hurts little more Pain Location: L side Pain Descriptors / Indicators: Sore Pain Intervention(s): Monitored during session;Repositioned    Home Living                      Prior Function            PT Goals (current goals can now be found in the care plan section) Progress towards PT goals: Progressing toward goals    Frequency  Min 4X/week    PT Plan Current plan remains appropriate    Co-evaluation  End of Session Equipment Utilized During Treatment: Gait belt Activity Tolerance: Patient limited by fatigue Patient left: in chair;with call bell/phone within reach;with chair alarm set;with family/visitor present     Time: 1610-9604 PT Time Calculation (min) (ACUTE ONLY): 24 min  Charges:  $Gait Training: 8-22 mins $Neuromuscular Re-education: 8-22 mins                    G Codes:      Elray Mcgregor 2015/10/02, 2:30 PM  Sheran Lawless, PT [phone] 2015-10-02

## 2015-09-28 NOTE — Progress Notes (Signed)
   09/28/15 1300  Clinical Encounter Type  Visited With Patient and family together  Visit Type Initial;Social support  Referral From Family  Consult/Referral To Chaplain  Spiritual Encounters  Spiritual Needs Literature  Stress Factors  Patient Stress Factors Health changes  Family Stress Factors Health changes   Chaplain answered page to take AD paperwork to patient and family.  AD paperwork dropped off with family and patient.  Chaplain advised patient and family to contact Spiritual Care when AD ready to be notarized.  Rosezella FloridaLisa M Bryant Saye 09/28/2015 1:20 PM

## 2015-09-28 NOTE — Progress Notes (Signed)
PROGRESS NOTE  [name]  ZOX:[id number] DOB: 04-08-1955  DOA: 09/26/2015 PCP: Lonia BloodGARBA,LAWAL, MD   Brief Narrative:  60 year old left-handed female, lives at home with family, independent of activities of daily living, PMH of CVA, HTN, HLD, GI bleed from gastric ulcer, hepatitis C, PTSD, depression, alcohol abuse, tobacco abuse, presented to [facility] ED on 09/26/15 with history of new onset left-sided weakness, lower extremity >upper extremity, slurred speech and facial droop which began at about noontime on day of admission. Admitted for stroke evaluation. Neurology consulted.   Assessment & Plan:   Principal Problem:   Acute ischemic stroke [facility](HCC) Active Problems:   Essential hypertension   GERD   Hyperlipidemia   Left-sided weakness   Tobacco abuse   Acute CVA (cerebrovascular accident) (HCC)   Chronic back pain   ETOH abuse   Hypokalemia   PTSD (post-traumatic stress disorder)   Gastroesophageal reflux disease without esophagitis   Acute right brain stroke - Resultant left facial droop, dysarthria and left hemiparesis. - CT brain 09/26/15: No acute intracranial pathology. - MRI head 09/27/15:16 mm acute ischemic nonhemorrhagic infarct involving the posterior right coronary data. No associated mass effect. Multiple remote lacunar infarcts involving bilateral external capsule/basal ganglia, bilateral thalami and right paramedian pons. - MRA head 09/27/15: Negative for large vessel occlusion. Atheromatous irregularity with superimposed mild to moderate multifocal stenosis. - Carotid Dopplers: Bilateral: No significant (1-39 percent) ICA stenosis. Antegrade vertebral flow. - 2-D echo: LVEF 55% and grade 2 diastolic dysfunction. No significant valvular abnormalities. - Hemoglobin A1c: 5.3 - LDL: 215. - Not on antithrombotic spread to admission. As per neurology follow-up, she was on aspirin in the past but taken off due to GI bleed caused by gastric ulcer. Therefore switched to Plavix for  secondary stroke prevention. - PT and OT recommend CIR-input appreciated: CIR if patient has caregiver support at discharge. No beds available for 7/17. Potentially to CIR - Pt will follow up with [facility] Rod[facility]Xu [facility] in about 2 months.   Hyperlipidemia - LDL 215, goal <70. Started atorvastatin 80 mg daily. Check LFTs in 4 weeks.  Essential hypertension - Controlled.  Possible 3 mm small aneurysm or tortuosity of the proximal right AICA, seen on MRA head - Outpatient follow-up as deemed necessary.  Chronic back pain - Seems to be controlled.  Depression - Stable. Continue home medications.  Tobacco abuse - Cessation counseled.  Hypokalemia - Replaced.  Atelectasis on chest x-ray - No signs and symptoms suggestive of pneumonia. Incentive spirometry.  History of GI bleed/GERD - Continue Pepcid.  Former alcohol use - States that she has not had alcohol to drink in 3 years.  Chronic neck and low back pain - Follows with pain management as outpatient. Continue home pain regimen.   DVT prophylaxis: Lovenox Code Status: Full Family Communication: Discussed with patient. No family at bedside. Disposition Plan: pending.   Consultants:   Neurology  Procedures:   2-D echo 09/27/15: Study Conclusions  - Left ventricle: The cavity size was normal. Wall thickness was  normal. The estimated ejection fraction was 55%. Wall motion was  normal; there were no regional wall motion abnormalities.  Features are consistent with a pseudonormal left ventricular  filling pattern, with concomitant abnormal relaxation and  increased filling pressure (grade 2 diastolic dysfunction). - Aortic valve: There was no stenosis. - Mitral valve: There was trivial regurgitation. - Right ventricle: The cavity size was normal. Systolic function  was normal. - Pulmonary arteries: No complete TR doppler jet so unable to  estimate PA systolic pressure. - Inferior vena cava: The vessel was normal  in size. The  respirophasic diameter changes were in the normal range (>= 50%),  consistent with normal central venous pressure.  Impressions:  - Normal LV size with EF 55%. Moderate diastolic dysfunction.  Normal RV size and systolic function. No significant valvular  abnormalities.   Carotid Doppler 09/27/15: Preliminary findings: Bilateral: No significant (1-39%) ICA stenosis. Antegrade vertebral flow.   Antimicrobials:   None    Subjective: Left-sided weakness: Improving per patient. Chronic neck and low back pain.  Objective:  Filed Vitals:   09/28/15 0128 09/28/15 0523 09/28/15 0935 09/28/15 1419  BP: 140/73 119/64 102/51 109/59  Pulse: 67 69 71 79  Temp: 98.8 F (37.1 C) 98.3 F (36.8 C) 98.2 F (36.8 C) 97.7 F (36.5 C)  TempSrc: Oral Oral Oral Oral  Resp: 20 20 16 20   Height:      Weight:      SpO2: 97% 100% 97% 99%    Intake/Output Summary (Last 24 hours) at 09/28/15 1537 Last data filed at 09/28/15 0935  Gross per 24 hour  Intake    480 ml  Output    300 ml  Net    180 ml   Filed Weights   09/26/15 2117 09/27/15 0102  Weight: 62.143 kg (137 lb) 61.961 kg (136 lb 9.6 oz)    Examination:  General exam: Pleasant middle-aged female sitting up comfortably in chair this morning.  Respiratory system: Clear to auscultation. Respiratory effort normal. Cardiovascular system: S1 & S2 heard, RRR. No JVD, murmurs, rubs, gallops or clicks. No pedal edema. Telemetry: Sinus rhythm. Gastrointestinal system: Abdomen is nondistended, soft and nontender. No organomegaly or masses felt. Normal bowel sounds heard. Central nervous system: Alert and oriented. Mild dysarthria. Facial asymmetry +, Seems better. Extremities: Grade 5 x 5 power in right limbs. Grade 3 x 5 power in left upper extremity and 2 x 5 power in left lower extremity. Skin: No rashes, lesions or ulcers Psychiatry: Judgement and insight appear normal. Mood & affect flat.     Data Reviewed: I  have personally reviewed following labs and imaging studies  CBC:  Recent Labs Lab 09/26/15 2129 09/26/15 2156 09/27/15 0413 09/27/15 0727  WBC 6.4  --  6.6 6.0  NEUTROABS 3.3  --   --   --   HGB 13.1 14.6 13.2 13.2  HCT 40.3 43.0 40.2 39.9  MCV 92.9  --  93.5 92.6  PLT 173  --  171 162   Basic Metabolic Panel:  Recent Labs Lab 09/26/15 2129 09/26/15 2156 09/27/15 0413  NA 140 146* 141  K 3.3* 3.3* 3.5  CL 110 108 114*  CO2 21*  --  20*  GLUCOSE 124* 122* 89  BUN 11 12 12   CREATININE 1.09* 1.00 0.87  0.89  CALCIUM 9.6  --  9.3   GFR: Estimated Creatinine Clearance: 57.9 mL/min (by C-G formula based on Cr of 0.87). Liver Function Tests:  Recent Labs Lab 09/26/15 2129  AST 20  ALT 11*  ALKPHOS 82  BILITOT 0.7  PROT 7.8  ALBUMIN 4.1   No results for input(s): LIPASE, AMYLASE in the last 168 hours. No results for input(s): AMMONIA in the last 168 hours. Coagulation Profile:  Recent Labs Lab 09/26/15 2129  INR 1.17   Cardiac Enzymes: No results for input(s): CKTOTAL, CKMB, CKMBINDEX, TROPONINI in the last 168 hours. BNP (last 3 results) No results for input(s): PROBNP in the last  8760 hours. HbA1C:  Recent Labs  09/27/15 0727  HGBA1C 5.3   CBG: No results for input(s): GLUCAP in the last 168 hours. Lipid Profile:  Recent Labs  09/27/15 0413  CHOL 284*  HDL 40*  LDLCALC 215*  TRIG 144  CHOLHDL 7.1   Thyroid Function Tests: No results for input(s): TSH, T4TOTAL, FREET4, T3FREE, THYROIDAB in the last 72 hours. Anemia Panel: No results for input(s): VITAMINB12, FOLATE, FERRITIN, TIBC, IRON, RETICCTPCT in the last 72 hours.  Sepsis Labs: No results for input(s): PROCALCITON, LATICACIDVEN in the last 168 hours.  No results found for this or any previous visit (from the past 240 hour(s)).       Radiology Studies: Ct Head Wo Contrast  09/26/2015  CLINICAL DATA:  60 year old female with left-sided weakness. History of prior stroke.  EXAM: CT HEAD WITHOUT CONTRAST TECHNIQUE: Contiguous axial images were obtained from the base of the skull through the vertex without intravenous contrast. COMPARISON:  Head CT dated 05/22/2014 FINDINGS: The ventricles and the sulci are appropriate in size for the patient's age. There is no intracranial hemorrhage. No midline shift or mass effect identified. The gray-white matter differentiation is preserved. There is diffuse mucoperiosteal thickening and partial opacification of the left maxillary sinus. There is old-appearing fracture of the left lamina Propecia. The remainder of the visualized paranasal sinuses and mastoid air cells are clear. The calvarium is intact. IMPRESSION: No acute intracranial pathology. If symptoms persist and there are no contraindications, MRI may provide better evaluation if clinically indicated These results were called by telephone at the time of interpretation on 09/26/2015 at 11:11 pm to Dr. Bethann Berkshire , who verbally acknowledged these results. Electronically Signed   By: Elgie Collard M.D.   On: 09/26/2015 23:13   Mr Maxine Glenn Head Wo Contrast  09/27/2015  CLINICAL DATA:  Initial evaluation for acute left-sided weakness. EXAM: MRI HEAD WITHOUT CONTRAST MRA HEAD WITHOUT CONTRAST TECHNIQUE: Multiplanar, multiecho pulse sequences of the brain and surrounding structures were obtained without intravenous contrast. Angiographic images of the head were obtained using MRA technique without contrast. COMPARISON:  Prior CT from 09/26/2015 as well as previous MRI from 10/09/2012. FINDINGS: MRI HEAD FINDINGS Cerebral volume within normal limits for patient age. Minimal patchy T2/FLAIR hyperintensity within the periventricular and deep white matter, most consistent with chronic small vessel ischemic disease, mild for age. Chronic small vessel ischemic type changes present within the pons is well. Multiple remote lacunar infarcts involving the bilateral external capsule/lentiform nuclei as  well as the basal ganglia and thalami. Remote lacunar infarct within the right paramedian pons. No areas of remote cortical infarction appreciated. There is a 16 mm focus of restricted diffusion within the posterior right corona radiata extending inferiorly towards the right external capsule, consistent with an acute ischemic infarct (series 4, image 31). No associated mass effect or hemorrhage. No other acute infarct. Gray-white matter differentiation otherwise maintained. Major intracranial vascular flow voids are preserved. No acute or chronic intracranial hemorrhage. No mass lesion, midline shift, or mass effect. No hydrocephalus. No extra-axial fluid collection. Major dural sinuses are grossly patent. Craniocervical junction within normal limits mild degenerative disc bulging noted at C2-3 without stenosis. Visualized upper cervical spine otherwise unremarkable. Pituitary gland normal. No acute abnormality about the globes and orbits. Mucosal thickening with fluid level within the left maxillary sinus. Mild scattered mucosal thickening within the ethmoidal air cells and right maxillary sinus. Trace opacity left mastoid air cells. Inner ear structures normal. Bone marrow signal intensity within  normal limits. No scalp soft tissue abnormality. MRA HEAD FINDINGS ANTERIOR CIRCULATION: Visualized distal cervical segments of the internal carotid arteries are patent with antegrade flow. Petrous segments widely patent. Mild scattered atheromatous irregularity within the cavernous/ supraclinoid ICAs without focal high-grade stenosis right A1 segment hypoplastic with multiple regularity. Dominant left A1 segment widely patent. The and inferior communicating artery within normal limits. Left anterior cerebral artery widely patent. Right anterior cerebral arteries somewhat diminutive and attenuated but is also patent to its distal aspect. M1 segments patent without up mild atheromatous irregularity within the mid and  distal M1 segments without focal high-grade stenosis. MCA bifurcations within normal limits. Multifocal atheromatous irregularity with stenoses involving the MCA branches bilaterally, slightly worse on the left. POSTERIOR CIRCULATION: Left vertebral artery dominant and patent to the vertebrobasilar junction. Diminutive right vertebral artery terminates in PICA. Multi focal atheromatous irregularity with mild to moderate multi focal narrowing present within the proximal-mid basilar artery. Basilar artery widely patent distally. Superior cerebral arteries patent with multifocal atheromatous irregularity. Both of the posterior cerebral arteries arise from the basilar artery and are patent to their distal aspects. Multifocal atheromatous irregularity with stenoses within the PCAs bilaterally. Small 3 mm focal outpouching arising from the right/posterior aspect of mid basilar artery, potentially at the takeoff of the anterior inferior cerebral artery (series 6, image 88). Finding may reflect a small aneurysm. IMPRESSION: MRI HEAD IMPRESSION: 1. 16 mm acute ischemic nonhemorrhagic infarct involving the posterior right corona radiata. No associated mass effect. 2. Multiple remote lacunar infarcts involving the bilateral external capsules/basal ganglia, bilateral thalami, and the right paramedian pons. 3. Mild chronic small vessel ischemic disease. 4. Acute left maxillary sinusitis. MRA HEAD IMPRESSION: 1. Negative for large vessel occlusion. 2. Atheromatous irregularity with superimposed mild to moderate multi focal stenoses. Overall, these changes have progressed from prior. 3. Extensive atheromatous irregularity with stenoses involving the PCAs bilaterally, also progressed from prior. 4. Moderate multifocal atheromatous irregularity with stenoses involving the MCA branches bilaterally, left greater than right. 5. 3 mm focal outpouching arising from the right posterior aspect of the proximal basilar artery, which may or  reflect a small aneurysm or tortuosity of the proximal right AICA. Attention at follow-up recommended. Electronically Signed   By: Rise Mu M.D.   On: 09/27/2015 06:58   Mr Brain Wo Contrast  09/27/2015  CLINICAL DATA:  Initial evaluation for acute left-sided weakness. EXAM: MRI HEAD WITHOUT CONTRAST MRA HEAD WITHOUT CONTRAST TECHNIQUE: Multiplanar, multiecho pulse sequences of the brain and surrounding structures were obtained without intravenous contrast. Angiographic images of the head were obtained using MRA technique without contrast. COMPARISON:  Prior CT from 09/26/2015 as well as previous MRI from 10/09/2012. FINDINGS: MRI HEAD FINDINGS Cerebral volume within normal limits for patient age. Minimal patchy T2/FLAIR hyperintensity within the periventricular and deep white matter, most consistent with chronic small vessel ischemic disease, mild for age. Chronic small vessel ischemic type changes present within the pons is well. Multiple remote lacunar infarcts involving the bilateral external capsule/lentiform nuclei as well as the basal ganglia and thalami. Remote lacunar infarct within the right paramedian pons. No areas of remote cortical infarction appreciated. There is a 16 mm focus of restricted diffusion within the posterior right corona radiata extending inferiorly towards the right external capsule, consistent with an acute ischemic infarct (series 4, image 31). No associated mass effect or hemorrhage. No other acute infarct. Gray-white matter differentiation otherwise maintained. Major intracranial vascular flow voids are preserved. No acute or chronic intracranial hemorrhage.  No mass lesion, midline shift, or mass effect. No hydrocephalus. No extra-axial fluid collection. Major dural sinuses are grossly patent. Craniocervical junction within normal limits mild degenerative disc bulging noted at C2-3 without stenosis. Visualized upper cervical spine otherwise unremarkable. Pituitary  gland normal. No acute abnormality about the globes and orbits. Mucosal thickening with fluid level within the left maxillary sinus. Mild scattered mucosal thickening within the ethmoidal air cells and right maxillary sinus. Trace opacity left mastoid air cells. Inner ear structures normal. Bone marrow signal intensity within normal limits. No scalp soft tissue abnormality. MRA HEAD FINDINGS ANTERIOR CIRCULATION: Visualized distal cervical segments of the internal carotid arteries are patent with antegrade flow. Petrous segments widely patent. Mild scattered atheromatous irregularity within the cavernous/ supraclinoid ICAs without focal high-grade stenosis right A1 segment hypoplastic with multiple regularity. Dominant left A1 segment widely patent. The and inferior communicating artery within normal limits. Left anterior cerebral artery widely patent. Right anterior cerebral arteries somewhat diminutive and attenuated but is also patent to its distal aspect. M1 segments patent without up mild atheromatous irregularity within the mid and distal M1 segments without focal high-grade stenosis. MCA bifurcations within normal limits. Multifocal atheromatous irregularity with stenoses involving the MCA branches bilaterally, slightly worse on the left. POSTERIOR CIRCULATION: Left vertebral artery dominant and patent to the vertebrobasilar junction. Diminutive right vertebral artery terminates in PICA. Multi focal atheromatous irregularity with mild to moderate multi focal narrowing present within the proximal-mid basilar artery. Basilar artery widely patent distally. Superior cerebral arteries patent with multifocal atheromatous irregularity. Both of the posterior cerebral arteries arise from the basilar artery and are patent to their distal aspects. Multifocal atheromatous irregularity with stenoses within the PCAs bilaterally. Small 3 mm focal outpouching arising from the right/posterior aspect of mid basilar artery,  potentially at the takeoff of the anterior inferior cerebral artery (series 6, image 88). Finding may reflect a small aneurysm. IMPRESSION: MRI HEAD IMPRESSION: 1. 16 mm acute ischemic nonhemorrhagic infarct involving the posterior right corona radiata. No associated mass effect. 2. Multiple remote lacunar infarcts involving the bilateral external capsules/basal ganglia, bilateral thalami, and the right paramedian pons. 3. Mild chronic small vessel ischemic disease. 4. Acute left maxillary sinusitis. MRA HEAD IMPRESSION: 1. Negative for large vessel occlusion. 2. Atheromatous irregularity with superimposed mild to moderate multi focal stenoses. Overall, these changes have progressed from prior. 3. Extensive atheromatous irregularity with stenoses involving the PCAs bilaterally, also progressed from prior. 4. Moderate multifocal atheromatous irregularity with stenoses involving the MCA branches bilaterally, left greater than right. 5. 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 Attention at follow-up recommended. Electronically Signed   By: Rise Mu M.D.   On: 09/27/2015 06:58   Dg Chest Port 1 View  09/26/2015  CLINICAL DATA:  Initial evaluation for acute weakness. EXAM: PORTABLE CHEST 1 VIEW COMPARISON:  Prior radiograph from 05/22/2014. FINDINGS: Cardiac and mediastinal silhouettes are within normal limits. Atheromatous plaque within the aortic arch. Lungs are hypoinflated. Patchy and hazy bibasilar opacities, right greater than left, favored to reflect atelectasis. Superimposed infection, particularly at the right lung base could be considered in the correct clinical setting. No pulmonary edema or pleural effusion. No pneumothorax. No acute osseous abnormality. IMPRESSION: 1. Shallow lung inflation with patchy and hazy bibasilar opacities, right greater than left.  Atelectasis is favored. Superimposed infection, particularly at the right lung base, could be considered  in the correct clinical setting. 2. Aortic atherosclerosis. Electronically Signed   By: Rise Mu M.D.   On: 09/26/2015 22:07        Scheduled Meds: . atorvastatin  80 mg Oral q1800  . baclofen  10 mg Oral BID  . [START ON 09/29/2015] clopidogrel  75 mg Oral Daily  . cyclobenzaprine  5 mg Oral BID  . cycloSPORINE  1 drop Both Eyes BID  . enoxaparin (LOVENOX) injection  40 mg Subcutaneous Q24H  . famotidine  20 mg Oral BID  . fluticasone  1 spray Each Nare Daily  . gabapentin  300 mg Oral TID  . latanoprost  1 drop Both Eyes QHS  . pantoprazole  40 mg Oral BID AC  . rOPINIRole  0.25 mg Oral TID  . sertraline  50 mg Oral Daily  . sodium chloride flush  3 mL Intravenous Q12H   Continuous Infusions:    LOS: 1 day    Time spent: 25 minutes.    [facility], MD [facility] Pager [phone]  If 7PM-7AM, please contact night-coverage [email or web address] Password [facility] 09/28/2015, 3:37 PM

## 2015-09-28 NOTE — Consult Note (Signed)
Physical Medicine and Rehabilitation Consult Reason for Consult: Nonhemorrhagic infarct posterior right corona radiata Referring Physician: [facility]   HPI: [name] is a 60 y.o. right handed female with history of chronic back pain, tobacco and alcohol abuse, PTSD, GI bleed. Per chart review patient lives with nieces. Two-level home with bed and bath upstairs. Does not have 24/7 assist.  Presented 09/26/2015 with acute onset of left-sided weakness. Cranial CT scan negative. 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 Multiple remote lacunar infarcts involving the bilateral external capsules/basal ganglia and the right paramedian pons. MRA of the head negative. Patient did not receive TPA. Echocardiogram with ejection fraction of 55% grade 2 diastolic dysfunction. Carotid Dopplers with no ICA stenosis. Neurology consulted maintained on aspirin for CVA prophylaxis. Subcutaneous Lovenox for DVT prophylaxis. Tolerating a regular consistency diet.   Review of Systems  Constitutional: Negative for fever and chills.  HENT: Negative for hearing loss.   Eyes: Negative for blurred vision and double vision.  Respiratory: Positive for cough.        Occasional shortness of breath with exertion  Cardiovascular: Negative for chest pain, palpitations and leg swelling.  Gastrointestinal: Positive for constipation. Negative for nausea and vomiting.  Genitourinary: Negative for dysuria and hematuria.  Musculoskeletal: Positive for back pain.  Skin: Negative for rash.  Neurological: Positive for weakness and headaches. Negative for seizures and loss of consciousness.  Psychiatric/Behavioral: Positive for depression.  All other systems reviewed and are negative.  Past Medical History  Diagnosis Date  . Chronic back pain   . MDD (major depressive disorder) (HCC)   . Alcohol abuse   . PTSD (post-traumatic stress disorder)   . UTI (lower  urinary tract infection)   . Colitis   . Stroke (HCC)   . Pancreatitis   . Hepatitis     Hepatitis C  . GI bleed   . Gastric ulcer   . Fatty liver   . CVA (cerebral infarction)    Past Surgical History  Procedure Laterality Date  . Esophagogastroduodenoscopy endoscopy    . Esophagogastroduodenoscopy N/A 05/23/2014    Procedure: ESOPHAGOGASTRODUODENOSCOPY (EGD);  Surgeon: Beverley Fiedler, MD;  Location: [facility] ENDOSCOPY;  Service: Endoscopy;  Laterality: N/A;  . Appendectomy    . Gall stone removed    . Abdominal hysterectomy    . Colonoscopy with propofol N/A 08/13/2015    Procedure: COLONOSCOPY WITH PROPOFOL;  Surgeon: Rachael Fee, MD;  Location: [facility] ENDOSCOPY;  Service: Endoscopy;  Laterality: N/A;  . Esophagogastroduodenoscopy (egd) with propofol N/A 08/13/2015    Procedure: ESOPHAGOGASTRODUODENOSCOPY (EGD) WITH PROPOFOL;  Surgeon: Rachael Fee, MD;  Location: [facility] ENDOSCOPY;  Service: Endoscopy;  Laterality: N/A;   Family History  Problem Relation Age of Onset  . Stroke Mother   . Cancer Sister     type unknown  . Diabetes Father    Social History:  reports that she has been smoking Cigarettes.  She has been smoking about 0.50 packs per day. She quit smokeless tobacco use about 37 years ago. Her smokeless tobacco use included Chew. She reports that she does not drink alcohol or use illicit drugs. Allergies:  Allergies  Allergen Reactions  . Banana Other (See Comments)    Welts on skin and sores in mouth  . Ibuprofen Other (See Comments)    blisters  . Penicillins Other (See Comments)    Blister Has patient had a PCN reaction causing immediate rash, facial/tongue/throat  swelling, SOB or lightheadedness with hypotension: no Has patient had a PCN reaction causing severe rash involving mucus membranes or skin necrosis: yes - blisters Has patient had a PCN reaction that required hospitalization no Has patient had a PCN reaction occurring within the last 10 years: no If all of the  above answers are "NO", then may proceed with Cephalosporin use.    Medications Prior to Admission  Medication Sig Dispense Refill  . albuterol (PROVENTIL HFA;VENTOLIN HFA) 108 (90 BASE) MCG/ACT inhaler Inhale 2 puffs into the lungs every 8 (eight) hours as needed for wheezing or shortness of breath.    . baclofen (LIORESAL) 10 MG tablet Take 10 mg by mouth 2 (two) times daily.    . cyclobenzaprine (FLEXERIL) 5 MG tablet Take 1 tablet (5 mg total) by mouth 2 (two) times daily. 30 tablet 0  . cycloSPORINE (RESTASIS) 0.05 % ophthalmic emulsion Place 1 drop into both eyes 2 (two) times daily.    Marland Kitchen doxycycline (VIBRAMYCIN) 100 MG capsule Take 100 mg by mouth 2 (two) times daily.    . fluticasone (FLONASE) 50 MCG/ACT nasal spray Place 1 spray into the nose daily as needed for allergies.   2  . gabapentin (NEURONTIN) 300 MG capsule Take 300 mg by mouth 3 (three) times daily.   1  . loratadine (CLARITIN) 10 MG tablet Take 10 mg by mouth daily.  2  . oxyCODONE-acetaminophen (PERCOCET) 7.5-325 MG per tablet Take 1 tablet by mouth every 4 (four) hours as needed for pain.   0  . pantoprazole (PROTONIX) 40 MG tablet Take 1 tablet (40 mg total) by mouth 2 (two) times daily before a meal. 60 tablet 0  . rOPINIRole (REQUIP) 0.25 MG tablet Take 0.25 mg by mouth 3 (three) times daily.    . sertraline (ZOLOFT) 50 MG tablet Take 50 mg by mouth daily.    Marland Kitchen SIMBRINZA 1-0.2 % SUSP Place 1 drop into both eyes 3 (three) times daily.   4  . temazepam (RESTORIL) 15 MG capsule Take 15 mg by mouth at bedtime.    . Travoprost, BAK Free, (TRAVATAN) 0.004 % SOLN ophthalmic solution Place 1 drop into both eyes at bedtime.       Home: Home Living Family/patient expects to be discharged to:: Private residence Living Arrangements: Other relatives (neices) Available Help at Discharge: Family, Available 24 hours/day Type of Home: House Home Access: Stairs to enter Secretary/administrator of Steps: 1 Home Layout: Two level,  Bed/bath upstairs Alternate Level Stairs-Number of Steps: 14 Alternate Level Stairs-Rails: Right Bathroom Shower/Tub: Engineer, manufacturing systems: Standard Home Equipment: Cane - single point, Banker History: Prior Function Level of Independence: Needs assistance Gait / Transfers Assistance Needed: reports was independent with ambulation and toileting ADL's / Homemaking Assistance Needed: assist with bathing and dressing per PT eval. Pt reports independent with BADLs during OT eval. Functional Status:  Mobility: Bed Mobility Overal bed mobility: Needs Assistance Bed Mobility: Supine to Sit Supine to sit: HOB elevated, Min assist, Mod assist General bed mobility comments: Pt OOB in chair upon arrival. Transfers Overall transfer level: Needs assistance Equipment used: Rolling walker (2 wheeled) Transfers: Sit to/from Stand Sit to Stand: Mod assist General transfer comment: Pt declining functional mobility at this time secondary to just ambulating with PT. Ambulation/Gait Ambulation/Gait assistance: Mod assist, Max assist Ambulation Distance (Feet): 15 Feet Assistive device: Rolling walker (2 wheeled) Gait Pattern/deviations: Step-to pattern, Decreased dorsiflexion - left, Decreased stride length, Shuffle General Gait Details:  dragging L foot, assist for weight shift and cues to shift to R, assist for walker propulsion, leaning to L throughout, cues for L LE extension in stance    ADL: ADL Overall ADL's : Needs assistance/impaired Eating/Feeding: Moderate assistance, With adaptive utensils, Sitting Eating/Feeding Details (indicate cue type and reason): Simulated by oral care activity. Educated pt on use of red foam with utensils to increase independence with self feeding. Grooming: Moderate assistance, With adaptive equipment, Sitting, Oral care, Wash/dry face Grooming Details (indicate cue type and reason): Pt able to brush teeth with use of red foam to build  up handle; used R hand for brushing but attempting to use L hand to assist. Upper Body Bathing: Moderate assistance, Sitting Lower Body Bathing: Maximal assistance, Sit to/from stand Upper Body Dressing : Moderate assistance, Sitting Lower Body Dressing: Maximal assistance, Sit to/from stand General ADL Comments: Did not assess transfers at this time; pt just finished ambulating with PT and politely declined due to fatigue. Educated pt on proper positioning of LUE, incorporating LUE into functional activities, use of red foam for increased independence with self feeding and grooming tasks.  Cognition: Cognition Overall Cognitive Status: No family/caregiver present to determine baseline cognitive functioning Orientation Level: Oriented X4 Cognition Arousal/Alertness: Awake/alert Behavior During Therapy: WFL for tasks assessed/performed Overall Cognitive Status: No family/caregiver present to determine baseline cognitive functioning Area of Impairment: Following commands, Problem solving Following Commands: Follows multi-step commands with increased time Problem Solving: Slow processing  Blood pressure 119/64, pulse 69, temperature 98.3 F (36.8 C), temperature source Oral, resp. rate 20, height 5\' 4"  (1.626 m), weight 61.961 kg (136 lb 9.6 oz), SpO2 100 %. Physical Exam  Vitals reviewed. Constitutional: She is oriented to person, place, and time. She appears well-developed and well-nourished.  HENT:  Head: Normocephalic and atraumatic.  Eyes: Conjunctivae and EOM are normal.  Neck: Normal range of motion. Neck supple. No thyromegaly present.  Cardiovascular: Normal rate and regular rhythm.   Respiratory: Effort normal and breath sounds normal. No respiratory distress.  GI: Soft. Bowel sounds are normal. She exhibits no distension.  Musculoskeletal: She exhibits no edema or tenderness.  Neurological: She is alert and oriented to person, place, and time.  Speech is a bit hoarse but  intelligible.  Follows simple commands.  Fair awareness of deficits with complaints of back pain. Sensation diminished to light touch LLE 3+ DTRs LLE Left facial weakness Motor: RUE: 4+/5 proximal to distal LUE: 3/5 proximal to distal LLE: 2-/5 hip flexion, knee extension, 1/5 ankle dorsi/plantar flexion  Skin: Skin is warm and dry.  Psychiatric: Her affect is blunt. She is slowed.    Results for orders placed or performed during the hospital encounter of 09/26/15 (from the past 24 hour(s))  CBC     Status: None   Collection Time: 09/27/15  7:27 AM  Result Value Ref Range   WBC 6.0 4.0 - 10.5 K/uL   RBC 4.31 3.87 - 5.11 MIL/uL   Hemoglobin 13.2 12.0 - 15.0 g/dL   HCT 40.9 81.1 - 91.4 %   MCV 92.6 78.0 - 100.0 fL   MCH 30.6 26.0 - 34.0 pg   MCHC 33.1 30.0 - 36.0 g/dL   RDW 78.2 95.6 - 21.3 %   Platelets 162 150 - 400 K/uL   Ct Head Wo Contrast  09/26/2015  CLINICAL DATA:  60 year old female with left-sided weakness. History of prior stroke. EXAM: CT HEAD WITHOUT CONTRAST TECHNIQUE: Contiguous axial images were obtained from the base of the  skull through the vertex without intravenous contrast. COMPARISON:  Head CT dated 05/22/2014 FINDINGS: The ventricles and the sulci are appropriate in size for the patient's age. There is no intracranial hemorrhage. No midline shift or mass effect identified. The gray-white matter differentiation is preserved. There is diffuse mucoperiosteal thickening and partial opacification of the left maxillary sinus. There is old-appearing fracture of the left lamina Propecia. The remainder of the visualized paranasal sinuses and mastoid air cells are clear. The calvarium is intact. IMPRESSION: No acute intracranial pathology. If symptoms persist and there are no contraindications, MRI may provide better evaluation if clinically indicated These results were called by telephone at the time of interpretation on 09/26/2015 at 11:11 pm to Dr. Bethann BerkshireJOSEPH ZAMMIT , who  verbally acknowledged these results. Electronically Signed   By: Elgie CollardArash  Radparvar M.D.   On: 09/26/2015 23:13   Mr Maxine GlennMra Head Wo Contrast  09/27/2015  CLINICAL DATA:  Initial evaluation for acute left-sided weakness. EXAM: MRI HEAD WITHOUT CONTRAST MRA HEAD WITHOUT CONTRAST TECHNIQUE: Multiplanar, multiecho pulse sequences of the brain and surrounding structures were obtained without intravenous contrast. Angiographic images of the head were obtained using MRA technique without contrast. COMPARISON:  Prior CT from 09/26/2015 as well as previous MRI from 10/09/2012. FINDINGS: MRI HEAD FINDINGS Cerebral volume within normal limits for patient age. Minimal patchy T2/FLAIR hyperintensity within the periventricular and deep white matter, most consistent with chronic small vessel ischemic disease, mild for age. Chronic small vessel ischemic type changes present within the pons is well. Multiple remote lacunar infarcts involving the bilateral external capsule/lentiform nuclei as well as the basal ganglia and thalami. Remote lacunar infarct within the right paramedian pons. No areas of remote cortical infarction appreciated. There is a 16 mm focus of restricted diffusion within the posterior right corona radiata extending inferiorly towards the right external capsule, consistent with an acute ischemic infarct (series 4, image 31). No associated mass effect or hemorrhage. No other acute infarct. Gray-white matter differentiation otherwise maintained. Major intracranial vascular flow voids are preserved. No acute or chronic intracranial hemorrhage. No mass lesion, midline shift, or mass effect. No hydrocephalus. No extra-axial fluid collection. Major dural sinuses are grossly patent. Craniocervical junction within normal limits mild degenerative disc bulging noted at C2-3 without stenosis. Visualized upper cervical spine otherwise unremarkable. Pituitary gland normal. No acute abnormality about the globes and orbits. Mucosal  thickening with fluid level within the left maxillary sinus. Mild scattered mucosal thickening within the ethmoidal air cells and right maxillary sinus. Trace opacity left mastoid air cells. Inner ear structures normal. Bone marrow signal intensity within normal limits. No scalp soft tissue abnormality. MRA HEAD FINDINGS ANTERIOR CIRCULATION: Visualized distal cervical segments of the internal carotid arteries are patent with antegrade flow. Petrous segments widely patent. Mild scattered atheromatous irregularity within the cavernous/ supraclinoid ICAs without focal high-grade stenosis right A1 segment hypoplastic with multiple regularity. Dominant left A1 segment widely patent. The and inferior communicating artery within normal limits. Left anterior cerebral artery widely patent. Right anterior cerebral arteries somewhat diminutive and attenuated but is also patent to its distal aspect. M1 segments patent without up mild atheromatous irregularity within the mid and distal M1 segments without focal high-grade stenosis. MCA bifurcations within normal limits. Multifocal atheromatous irregularity with stenoses involving the MCA branches bilaterally, slightly worse on the left. POSTERIOR CIRCULATION: Left vertebral artery dominant and patent to the vertebrobasilar junction. Diminutive right vertebral artery terminates in PICA. Multi focal atheromatous irregularity with mild to moderate multi focal narrowing present  within the proximal-mid basilar artery. Basilar artery widely patent distally. Superior cerebral arteries patent with multifocal atheromatous irregularity. Both of the posterior cerebral arteries arise from the basilar artery and are patent to their distal aspects. Multifocal atheromatous irregularity with stenoses within the PCAs bilaterally. Small 3 mm focal outpouching arising from the right/posterior aspect of mid basilar artery, potentially at the takeoff of the anterior inferior cerebral artery (series  6, image 88). Finding may reflect a small aneurysm. IMPRESSION: MRI HEAD IMPRESSION: 1. 16 mm acute ischemic nonhemorrhagic infarct involving the posterior right corona radiata. No associated mass effect. 2. Multiple remote lacunar infarcts involving the bilateral external capsules/basal ganglia, bilateral thalami, and the right paramedian pons. 3. Mild chronic small vessel ischemic disease. 4. Acute left maxillary sinusitis. MRA HEAD IMPRESSION: 1. Negative for large vessel occlusion. 2. Atheromatous irregularity with superimposed mild to moderate multi focal stenoses. Overall, these changes have progressed from prior. 3. Extensive atheromatous irregularity with stenoses involving the PCAs bilaterally, also progressed from prior. 4. Moderate multifocal atheromatous irregularity with stenoses involving the MCA branches bilaterally, left greater than right. 5. 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 Attention at follow-up recommended. Electronically Signed   By: Rise Mu M.D.   On: 09/27/2015 06:58   Mr Brain Wo Contrast  09/27/2015  CLINICAL DATA:  Initial evaluation for acute left-sided weakness. EXAM: MRI HEAD WITHOUT CONTRAST MRA HEAD WITHOUT CONTRAST TECHNIQUE: Multiplanar, multiecho pulse sequences of the brain and surrounding structures were obtained without intravenous contrast. Angiographic images of the head were obtained using MRA technique without contrast. COMPARISON:  Prior CT from 09/26/2015 as well as previous MRI from 10/09/2012. FINDINGS: MRI HEAD FINDINGS Cerebral volume within normal limits for patient age. Minimal patchy T2/FLAIR hyperintensity within the periventricular and deep white matter, most consistent with chronic small vessel ischemic disease, mild for age. Chronic small vessel ischemic type changes present within the pons is well. Multiple  remote lacunar infarcts involving the bilateral external capsule/lentiform nuclei as well as the basal ganglia and thalami. Remote lacunar infarct within the right paramedian pons. No areas of remote cortical infarction appreciated. There is a 16 mm focus of restricted diffusion within the posterior right corona radiata extending inferiorly towards the right external capsule, consistent with an acute ischemic infarct (series 4, image 31). No associated mass effect or hemorrhage. No other acute infarct. Gray-white matter differentiation otherwise maintained. Major intracranial vascular flow voids are preserved. No acute or chronic intracranial hemorrhage. No mass lesion, midline shift, or mass effect. No hydrocephalus. No extra-axial fluid collection. Major dural sinuses are grossly patent. Craniocervical junction within normal limits mild degenerative disc bulging noted at C2-3 without stenosis. Visualized upper cervical spine otherwise unremarkable. Pituitary gland normal. No acute abnormality about the globes and orbits. Mucosal thickening with fluid level within the left maxillary sinus. Mild scattered mucosal thickening within the ethmoidal air cells and right maxillary sinus. Trace opacity left mastoid air cells. Inner ear structures normal. Bone marrow signal intensity within normal limits. No scalp soft tissue abnormality. MRA HEAD FINDINGS ANTERIOR CIRCULATION: Visualized distal cervical segments of the internal carotid arteries are patent with antegrade flow. Petrous segments widely patent. Mild scattered atheromatous irregularity within the cavernous/ supraclinoid ICAs without focal high-grade stenosis right A1 segment hypoplastic with multiple regularity. Dominant left A1 segment widely patent. The and inferior communicating artery within normal limits. Left anterior cerebral  artery widely patent. Right anterior cerebral arteries somewhat diminutive and attenuated but is also patent to its distal aspect.  M1 segments patent without up mild atheromatous irregularity within the mid and distal M1 segments without focal high-grade stenosis. MCA bifurcations within normal limits. Multifocal atheromatous irregularity with stenoses involving the MCA branches bilaterally, slightly worse on the left. POSTERIOR CIRCULATION: Left vertebral artery dominant and patent to the vertebrobasilar junction. Diminutive right vertebral artery terminates in PICA. Multi focal atheromatous irregularity with mild to moderate multi focal narrowing present within the proximal-mid basilar artery. Basilar artery widely patent distally. Superior cerebral arteries patent with multifocal atheromatous irregularity. Both of the posterior cerebral arteries arise from the basilar artery and are patent to their distal aspects. Multifocal atheromatous irregularity with stenoses within the PCAs bilaterally. Small 3 mm focal outpouching arising from the right/posterior aspect of mid basilar artery, potentially at the takeoff of the anterior inferior cerebral artery (series 6, image 88). Finding may reflect a small aneurysm. IMPRESSION: MRI HEAD IMPRESSION: 1. 16 mm acute ischemic nonhemorrhagic infarct involving the posterior right corona radiata. No associated mass effect. 2. Multiple remote lacunar infarcts involving the bilateral external capsules/basal ganglia, bilateral thalami, and the right paramedian pons. 3. Mild chronic small vessel ischemic disease. 4. Acute left maxillary sinusitis. MRA HEAD IMPRESSION: 1. Negative for large vessel occlusion. 2. Atheromatous irregularity with superimposed mild to moderate multi focal stenoses. Overall, these changes have progressed from prior. 3. Extensive atheromatous irregularity with stenoses involving the PCAs bilaterally, also progressed from prior. 4. Moderate multifocal atheromatous irregularity with stenoses involving the MCA branches bilaterally, left greater than right. 5. 3 mm focal outpouching  arising from the right posterior aspect of the proximal basilar artery, which may or reflect a small aneurysm or tortuosity of the proximal right AICA. Attention at follow-up recommended. Electronically Signed   By: Rise Mu M.D.   On: 09/27/2015 06:58   Dg Chest Port 1 View  09/26/2015  CLINICAL DATA:  Initial evaluation for acute weakness. EXAM: PORTABLE CHEST 1 VIEW COMPARISON:  Prior radiograph from 05/22/2014. FINDINGS: Cardiac and mediastinal silhouettes are within normal limits. Atheromatous plaque within the aortic arch. Lungs are hypoinflated. Patchy and hazy bibasilar opacities, right greater than left, favored to reflect atelectasis. Superimposed infection, particularly at the right lung base could be considered in the correct clinical setting. No pulmonary edema or pleural effusion. No pneumothorax. No acute osseous abnormality. IMPRESSION: 1. Shallow lung inflation with patchy and hazy bibasilar opacities, right greater than left. Atelectasis is favored. Superimposed infection, particularly at the right lung base, could be considered in the correct clinical setting. 2. Aortic atherosclerosis. Electronically Signed   By: Rise Mu M.D.   On: 09/26/2015 22:07    Assessment/Plan: Diagnosis: Nonhemorrhagic infarct posterior right corona radiata Labs and images independently reviewed.  Records reviewed and summated above. Stroke: Continue secondary stroke prophylaxis and Risk Factor Modification listed below:   Antiplatelet therapy Blood Pressure Management:  Continue current medication with prn's with permisive HTN per primary team Statin Agent Tobacco abuse:   Left sided hemiparesis: fit for orthosis to prevent contractures (resting hand splint for day, wrist cock up splint at night, PRAFO, etc) Motor recovery: Fluoxetine   1. Does the need for close, 24 hr/day medical supervision in concert with the patient's rehab needs make it unreasonable for this patient to be  served in a less intensive setting? Potentially  2. Co-Morbidities requiring supervision/potential complications: chronic back pain (Biofeedback training with therapies to help reduce reliance  on opiate pain medications, monitor pain control during therapies, and sedation at rest and titrate to maximum efficacy to ensure participation and gains in therapies), tobacco and alcohol abuse (counsel), PTSD (cognitive therapy), GI bleed (cont to monitor), hypokalemia (continue to monitor and replete as necessary) 3. Due to safety, disease management, pain management and patient education, does the patient require 24 hr/day rehab nursing? Yes 4. Does the patient require coordinated care of a physician, rehab nurse, PT (1-2 hrs/day, 5 days/week), OT (1-2 hrs/day, 5 days/week) and SLP (1-2 hrs/day, 5 days/week) to address physical and functional deficits in the context of the above medical diagnosis(es)? Potentially Addressing deficits in the following areas: balance, endurance, locomotion, strength, transferring, bathing, dressing, toileting, speech and psychosocial support 5. Can the patient actively participate in an intensive therapy program of at least 3 hrs of therapy per day at least 5 days per week? Yes 6. The potential for patient to make measurable gains while on inpatient rehab is excellent 7. Anticipated functional outcomes upon discharge from inpatient rehab are min assist  with PT, min assist with OT, independent and modified independent with SLP. 8. Estimated rehab length of stay to reach the above functional goals is: 19-23 days. 9. Does the patient have adequate social supports and living environment to accommodate these discharge functional goals? Potentially 10. Anticipated D/C setting: Home 11. Anticipated post D/C treatments: HH therapy and Home excercise program 12. Overall Rehab/Functional Prognosis: good  RECOMMENDATIONS: This patient's condition is appropriate for continued  rehabilitative care in the following setting: CIR if pt has caregive support at discharge. However, there are no beds available at this time.  Patient has agreed to participate in recommended program. Potentially Note that insurance prior authorization may be required for reimbursement for recommended care.  Comment: Rehab Admissions Coordinator to follow up.  Maryla Morrow, MD 09/28/2015

## 2015-09-29 NOTE — Clinical Social Work Note (Signed)
CSW received consult for patient needing SNF placement as a back up to CIR.  CSW met with patient and explained that because she only has Medicaid her options are going to be limited on where she can go for short term rehab.  CSW explained to patient what the process is for CSW to work on placement, patient agreed and is motivated to get therapy so she can become strong again to return back home.  CSW was given permission by CSW to begin bed search.  Jones Broom. Eagle, MSW, [place] 09/29/2015 5:37 PM

## 2015-09-29 NOTE — Progress Notes (Signed)
I phoned pt's niece Alvis LemmingsDawn this am to discuss the possibility of IP Rehab and that I awaited a confirmation of bed availability on rehab today.  Unfortunately, I do not have a bed to offer pt. today.  I attempted to phone both of pt's nieces, Alvis LemmingsDawn and Waynetta SandyBeth to update them, however I did not get an answer and neither had voice mail boxes that were set up.  I updated the patient at the bedside.  I  notified Johnson & JohnsonCheryl Royal, RNCM and Windell Mouldingric Anterhaus, CSW.  I expect limited bed availability tomorrow.  Please call if questions.  Weldon PickingSusan Audia Amick PT Inpatient Rehab Admissions Coordinator Cell [phone] Office [phone]

## 2015-09-29 NOTE — H&P (Signed)
  Physical Medicine and Rehabilitation Admission H&P    Chief Complaint  Patient presents with  . Stroke Symptoms  : HPI: [name] is a 60 y.o. right handed female with history of chronic back pain, tobacco and alcohol abuse, PTSD, GI bleed. Per chart review patient lives with nieces. Two-level home with bed and bath upstairs.Reported to use a single-point cane prior to admission.  Presented 09/26/2015 with acute onset of left-sided weakness. Cranial CT scan negative. 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 Multiple remote lacunar infarcts involving the bilateral external capsules/basal ganglia and the right paramedian pons. MRA of the head negative. Patient did not receive TPA. Echocardiogram with ejection fraction of 55% grade 2 diastolic dysfunction. Carotid Dopplers with no ICA stenosis. Neurology consulted maintained on Plavix for CVA prophylaxis. Subcutaneous Lovenox for DVT prophylaxis. 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 She had affecting her left eye. In the 1990s. She also had some type of nerve problem in the right eye. She states she last drank alcohol in November 2016 and at that time, she was only drinking about one beer a day.  ROS Constitutional: Negative for fever and chills.  HENT: Negative for hearing loss.  Eyes: Negative for blurred vision and double vision.  Respiratory: Positive for cough.   Occasional shortness of breath with exertion  Cardiovascular: Negative for chest pain, palpitations and leg swelling.  Gastrointestinal: Positive for constipation. Negative for nausea and vomiting.  Genitourinary: Negative for dysuria and hematuria.  Musculoskeletal: Positive for back pain.  Skin: Negative for rash.  Neurological: Positive for weakness and headaches. Negative for  seizures and loss of consciousness.  Psychiatric/Behavioral: Positive for depression.  All other systems reviewed and are negative   Past Medical History  Diagnosis Date  . Chronic back pain   . MDD (major depressive disorder) (HCC)   . Alcohol abuse   . PTSD (post-traumatic stress disorder)   . UTI (lower urinary tract infection)   . Colitis   . Stroke (HCC)   . Pancreatitis   . Hepatitis     Hepatitis C  . GI bleed   . Gastric ulcer   . Fatty liver   . CVA (cerebral infarction)    Past Surgical History  Procedure Laterality Date  . Esophagogastroduodenoscopy endoscopy    . Esophagogastroduodenoscopy N/A 05/23/2014    Procedure: ESOPHAGOGASTRODUODENOSCOPY (EGD);  Surgeon: Jay M Pyrtle, MD;  Location: [facility];  Service: Endoscopy;  Laterality: N/A;  . Appendectomy    . Gall stone removed    . Abdominal hysterectomy    . Colonoscopy with propofol N/A 08/13/2015    Procedure: COLONOSCOPY WITH PROPOFOL;  Surgeon: Daniel P Jacobs, MD;  Location: [facility]COPY;  Service: Endoscopy;  Laterality: N/A;  . Esophagogastroduodenoscopy (egd) with propofol N/A 08/13/2015    Procedure: ESOPHAGOGASTRODUODENOSCOPY (EGD) WITH PROPOFOL;  Surgeon: Daniel P Jacobs, MD;  Location: [facility] ENDOSCOPY;  Service: Endoscopy;  Laterality: N/A;   Family History  Problem Relation Age of Onset  . Stroke Mother   . Cancer Sister     type unknown  . Diabetes Father    Social History:  reports that she has been smoking Cigarettes.  She has been smoking about 0.50 packs per day. She quit smokeless tobacco use about 37 years ago. Her smokeless tobacco use included Chew. She reports that she does not drink alcohol   or use illicit drugs. Allergies:  Allergies  Allergen Reactions  . Banana Other (See Comments)    Welts on skin and sores in mouth  . Ibuprofen Other (See Comments)    blisters  . Penicillins Other (See Comments)    Blister Has patient had a PCN reaction causing immediate rash,  facial/tongue/throat swelling, SOB or lightheadedness with hypotension: no Has patient had a PCN reaction causing severe rash involving mucus membranes or skin necrosis: yes - blisters Has patient had a PCN reaction that required hospitalization no Has patient had a PCN reaction occurring within the last 10 years: no If all of the above answers are "NO", then may proceed with Cephalosporin use.    Medications Prior to Admission  Medication Sig Dispense Refill  . albuterol (PROVENTIL HFA;VENTOLIN HFA) 108 (90 BASE) MCG/ACT inhaler Inhale 2 puffs into the lungs every 8 (eight) hours as needed for wheezing or shortness of breath.    . baclofen (LIORESAL) 10 MG tablet Take 10 mg by mouth 2 (two) times daily.    . cyclobenzaprine (FLEXERIL) 5 MG tablet Take 1 tablet (5 mg total) by mouth 2 (two) times daily. 30 tablet 0  . cycloSPORINE (RESTASIS) 0.05 % ophthalmic emulsion Place 1 drop into both eyes 2 (two) times daily.    . doxycycline (VIBRAMYCIN) 100 MG capsule Take 100 mg by mouth 2 (two) times daily.    . fluticasone (FLONASE) 50 MCG/ACT nasal spray Place 1 spray into the nose daily as needed for allergies.   2  . gabapentin (NEURONTIN) 300 MG capsule Take 300 mg by mouth 3 (three) times daily.   1  . loratadine (CLARITIN) 10 MG tablet Take 10 mg by mouth daily.  2  . oxyCODONE-acetaminophen (PERCOCET) 7.5-325 MG per tablet Take 1 tablet by mouth every 4 (four) hours as needed for pain.   0  . pantoprazole (PROTONIX) 40 MG tablet Take 1 tablet (40 mg total) by mouth 2 (two) times daily before a meal. 60 tablet 0  . rOPINIRole (REQUIP) 0.25 MG tablet Take 0.25 mg by mouth 3 (three) times daily.    . sertraline (ZOLOFT) 50 MG tablet Take 50 mg by mouth daily.    . SIMBRINZA 1-0.2 % SUSP Place 1 drop into both eyes 3 (three) times daily.   4  . temazepam (RESTORIL) 15 MG capsule Take 15 mg by mouth at bedtime.    . Travoprost, BAK Free, (TRAVATAN) 0.004 % SOLN ophthalmic solution Place 1 drop  into both eyes at bedtime.       Home: Home Living Family/patient expects to be discharged to:: Private residence Living Arrangements: Other relatives (neices) Available Help at Discharge: Family, Available 24 hours/day Type of Home: House Home Access: Stairs to enter Entrance Stairs-Number of Steps: 1 Home Layout: Two level, Bed/bath upstairs Alternate Level Stairs-Number of Steps: 14 Alternate Level Stairs-Rails: Right Bathroom Shower/Tub: Tub/shower unit Bathroom Toilet: Standard Home Equipment: Cane - single point, Shower seat   Functional History: Prior Function Level of Independence: Needs assistance Gait / Transfers Assistance Needed: reports was independent with ambulation and toileting ADL's / Homemaking Assistance Needed: assist with bathing and dressing per PT eval. Pt reports independent with BADLs during OT eval.  Functional Status:  Mobility: Bed Mobility Overal bed mobility: Needs Assistance Bed Mobility: Supine to Sit Supine to sit: HOB elevated, Min assist, Mod assist General bed mobility comments: Pt OOB in chair upon arrival. Transfers Overall transfer level: Needs assistance Equipment used: Rolling walker (2 wheeled)   Transfers: Sit to/from Stand Sit to Stand: Mod assist General transfer comment: increased time even after set up cues for L LE placement and hand placement, slow to rise with weakness on L Ambulation/Gait Ambulation/Gait assistance: Max assist, Mod assist Ambulation Distance (Feet): 15 Feet (x 2) Assistive device: Rolling walker (2 wheeled) Gait Pattern/deviations: Step-to pattern, Decreased stride length, Wide base of support, Shuffle, Ataxic, Decreased dorsiflexion - left General Gait Details: placing R LE almost outside of walker, cues for more central placement, assist for weight shift to R and cues/facilitation for L LE extension in stance; walked from chair around to sit on opposite side of bed, then back to chair     ADL: ADL Overall ADL's : Needs assistance/impaired Eating/Feeding: Moderate assistance, With adaptive utensils, Sitting Eating/Feeding Details (indicate cue type and reason): Simulated by oral care activity. Educated pt on use of red foam with utensils to increase independence with self feeding. Grooming: Moderate assistance, With adaptive equipment, Sitting, Oral care, Wash/dry face Grooming Details (indicate cue type and reason): Pt able to brush teeth with use of red foam to build up handle; used R hand for brushing but attempting to use L hand to assist. Upper Body Bathing: Moderate assistance, Sitting Lower Body Bathing: Maximal assistance, Sit to/from stand Upper Body Dressing : Moderate assistance, Sitting Lower Body Dressing: Maximal assistance, Sit to/from stand General ADL Comments: Did not assess transfers at this time; pt just finished ambulating with PT and politely declined due to fatigue. Educated pt on proper positioning of LUE, incorporating LUE into functional activities, use of red foam for increased independence with self feeding and grooming tasks.  Cognition: Cognition Overall Cognitive Status: No family/caregiver present to determine baseline cognitive functioning Orientation Level: Oriented X4 Cognition Arousal/Alertness: Awake/alert Behavior During Therapy: WFL for tasks assessed/performed Overall Cognitive Status: No family/caregiver present to determine baseline cognitive functioning Area of Impairment: Following commands, Problem solving Following Commands: Follows multi-step commands with increased time Problem Solving: Slow processing  Physical Exam: Blood pressure 146/70, pulse 57, temperature 97.9 F (36.6 C), temperature source Oral, resp. rate 18, height 5' 4" (1.626 m), weight 61.961 kg (136 lb 9.6 oz), SpO2 98 %. Physical Exam Constitutional: She is oriented to person, place, and time. She appears well-developed and well-nourished.  HENT:  Head:  Normocephalic and atraumatic.  Eyes: Conjunctivae and EOM are normal.  Neck: Normal range of motion. Neck supple. No thyromegaly present.  Cardiovascular: Normal rate and regular rhythm.  Respiratory: Effort normal and breath sounds normal. No respiratory distress.  GI: Soft. Bowel sounds are normal. She exhibits no distension.  Musculoskeletal: She exhibits no edema or tenderness.  Neurological: She is alert and oriented to person, place, and time.  Speech is a bit hoarse but intelligible.  Follows simple commands.  Fair awareness of deficits with complaints of back pain. Sensation diminished to light touch LLE 3+ DTRs LLE Left facial weakness Motor: RUE: 4+/5 proximal to distal LUE: 3/5 proximal to distal LLE: 2-/5 hip flexion, knee extension, 1/5 ankle dorsi/plantar flexion  Skin: Skin is warm and dry.  Psychiatric: Her affect is blunt. She is slowed   No results found for this or any previous visit (from the past 48 hour(s)). No results found.     Medical Problem List and Plan: 1.  Left side weakness secondary to nonhemorrhagic infarct posterior right corona radiata 2.  DVT Prophylaxis/Anticoagulation: Subcutaneous Lovenox. Monitor platelet counts in signs of bleeding 3. Pain Management/chronic back pain. Flexeril 5 mg twice a day, baclofen   10 mg twice a day, Neurontin 300 mg 3 times a day and Percocet as needed. 4. Mood/PTSD. Zoloft 50 mg daily, Requip 0.25 mg 3 times a day 5. Neuropsych: This patient is capable of making decisions on her own behalf. 6. Skin/Wound Care: Routine skin checks 7. Fluids/Electrolytes/Nutrition: Routine I&O's follow-up chemistries 8. Tobacco alcohol abuse. Counseling. Consider Nicoderm patch 9. History of GI bleed. Continue Pepcid twice a day. Follow-up CBC    Post Admission Physician Evaluation: 1. Functional deficits secondary  to left hemiparesis  2. Patient is admitted to receive collaborative, interdisciplinary care between the  physiatrist, rehab nursing staff, and therapy team. 3. Patient's level of medical complexity and substantial therapy needs in context of that medical necessity cannot be provided at a lesser intensity of care such as a SNF. 4. Patient has experienced substantial functional loss from his/her baseline which was documented above under the "Functional History" and "Functional Status" headings.  Judging by the patient's diagnosis, physical exam, and functional history, the patient has potential for functional progress which will result in measurable gains while on inpatient rehab.  These gains will be of substantial and practical use upon discharge  in facilitating mobility and self-care at the household level. 5. Physiatrist will provide 24 hour management of medical needs as well as oversight of the therapy plan/treatment and provide guidance as appropriate regarding the interaction of the two. 6. 24 hour rehab nursing will assist with bladder management, bowel management, safety, skin/wound care, disease management, medication administration, pain management and patient education  and help integrate therapy concepts, techniques,education, etc. 7. PT will assess and treat for/with: pre gait, gait training, endurance , safety, equipment, neuromuscular re education.   Goals are: Supervision/minA. 8. OT will assess and treat for/with: ADLs, Cognitive perceptual skills, Neuromuscular re education, safety, endurance, equipment.   Goals are: sup/minA. Therapy may proceed with showering this patient. 9. SLP will assess and treat for/with: Assess cognition, swallowing, medication management, vision.  Goals are: Supervision to modified independent medication management. 10. Case Management and Social Worker will assess and treat for psychological issues and discharge planning. 11. Team conference will be held weekly to assess progress toward goals and to determine barriers to discharge. 12. Patient will receive at  least 3 hours of therapy per day at least 5 days per week. 13. ELOS: 14-18 days       14. Prognosis:  good     Andrew E. Kirsteins M.D. [facility] FAAPM&R (Sports Med, Neuromuscular Med) Diplomate Am Board of Electrodiagnostic Med   09/29/2015 

## 2015-09-29 NOTE — Progress Notes (Addendum)
PROGRESS NOTE  [name]  ZOX:[id number] DOB: [birth year]  DOA: 09/26/2015 PCP: Lonia Blood, MD   Brief Narrative:  60 year old left-handed female, lives at home with family, independent of activities of daily living, PMH of CVA, HTN, HLD, GI bleed from gastric ulcer, hepatitis C, PTSD, depression, alcohol abuse, tobacco abuse, presented to [facility] ED on 09/26/15 with history of new onset left-sided weakness, lower extremity >upper extremity, slurred speech and facial droop which began at about noontime on day of admission. Admitted for stroke evaluation which has been completed. Neurology consulted. CIR consulted but did not have beds. DC to CIR versus SNF when bed available.   Assessment & Plan:   Principal Problem:   Acute ischemic stroke [facility]) Active Problems:   Essential hypertension   GERD   Hyperlipidemia   Left-sided weakness   Tobacco abuse   Acute CVA (cerebrovascular accident) (HCC)   Chronic back pain   ETOH abuse   Hypokalemia   PTSD (post-traumatic stress disorder)   Gastroesophageal reflux disease without esophagitis   Acute right brain stroke: Right corona radiata infarct secondary to small vessel disease source. - Resultant left facial droop, dysarthria and left hemiparesis. - CT brain 09/26/15: No acute intracranial pathology. - MRI head 09/27/15:16 mm acute ischemic nonhemorrhagic infarct involving the posterior right coronary data. No associated mass effect. Multiple remote lacunar infarcts involving bilateral external capsule/basal ganglia, bilateral thalami and right paramedian pons. - MRA head 09/27/15: Negative for large vessel occlusion. Atheromatous irregularity with superimposed mild to moderate multifocal stenosis. - Carotid Dopplers: Bilateral: No significant (1-39 percent) ICA stenosis. Antegrade vertebral flow. - 2-D echo: LVEF 55% and grade 2 diastolic dysfunction. No significant valvular abnormalities. - Hemoglobin A1c: 5.3 - LDL: 215. - Not on  antithrombotic prior to admission. As per neurology follow-up, she was on aspirin in the past but taken off due to GI bleed caused by gastric ulcer. Therefore switched to Plavix for secondary stroke prevention. - PT and OT recommend CIR - meets criteria but no beds available for 7/17 or 7/18.  - Pt will follow up with Dr. Roda Shutters at [facility] in about 2 months.   Hyperlipidemia - LDL 215, goal <70. Started atorvastatin 80 mg daily. Check LFTs in 4 weeks.  Essential hypertension - Controlled.  Possible 3 mm small aneurysm or tortuosity of the proximal right AICA, seen on MRA head - Outpatient follow-up as deemed necessary.  Depression - Stable. Continue home medications.  Tobacco abuse - Cessation counseled.  Hypokalemia - Replaced.  Atelectasis on chest x-ray - No signs and symptoms suggestive of pneumonia. Incentive spirometry.  History of GI bleed/GERD - Continue Pepcid.  Former alcohol use - States that she has not had alcohol to drink in 3 years.  Chronic neck and low back pain - Follows with pain management as outpatient. Continue home pain regimen. Controlled.   DVT prophylaxis: Lovenox Code Status: Full Family Communication: Discussed with patient. No family at bedside. Disposition Plan: CIR consulted but did not have beds. DC to CIR versus SNF when bed available.    Consultants:   Neurology-signed off 7/17.  Procedures:   2-D echo 09/27/15: Study Conclusions  - Left ventricle: The cavity size was normal. Wall thickness was  normal. The estimated ejection fraction was 55%. Wall motion was  normal; there were no regional wall motion abnormalities.  Features are consistent with a pseudonormal left ventricular  filling pattern, with concomitant abnormal relaxation and  increased filling pressure (grade 2 diastolic dysfunction). - Aortic valve: There was  no stenosis. - Mitral valve: There was trivial regurgitation. - Right ventricle: The cavity size was normal.  Systolic function  was normal. - Pulmonary arteries: No complete TR doppler jet so unable to  estimate PA systolic pressure. - Inferior vena cava: The vessel was normal in size. The  respirophasic diameter changes were in the normal range (>= 50%),  consistent with normal central venous pressure.  Impressions:  - Normal LV size with EF 55%. Moderate diastolic dysfunction.  Normal RV size and systolic function. No significant valvular  abnormalities.   Carotid Doppler 09/27/15: Preliminary findings: Bilateral: No significant (1-39%) ICA stenosis. Antegrade vertebral flow.   Antimicrobials:   None    Subjective: Left-sided weakness: Improving per patient. No new complaints reported.  Objective:  Filed Vitals:   09/29/15 0210 09/29/15 0500 09/29/15 1039 09/29/15 1516  BP: 131/78 146/70 138/74 132/74  Pulse: 64 57 62 58  Temp: 98.2 F (36.8 C) 97.9 F (36.6 C) 98.1 F (36.7 C) 97.8 F (36.6 C)  TempSrc: Oral Oral Oral Oral  Resp: 20 18 20 20   Height:      Weight:      SpO2: 100% 98% 99% 100%    Intake/Output Summary (Last 24 hours) at 09/29/15 1518 Last data filed at 09/29/15 1240  Gross per 24 hour  Intake   1080 ml  Output      0 ml  Net   1080 ml   Filed Weights   09/26/15 2117 09/27/15 0102  Weight: 62.143 kg (137 lb) 61.961 kg (136 lb 9.6 oz)    Examination:  General exam: Pleasant middle-aged female sitting up comfortably in chair this morning.  Respiratory system: Clear to auscultation. Respiratory effort normal. Cardiovascular system: S1 & S2 heard, RRR. No JVD, murmurs, rubs, gallops or clicks. No pedal edema. Gastrointestinal system: Abdomen is nondistended, soft and nontender. No organomegaly or masses felt. Normal bowel sounds heard. Central nervous system: Alert and oriented. Mild dysarthria. Facial asymmetry +, Seems better. Extremities: Grade 5 x 5 power in right limbs. Grade 3 x 5 power in left upper extremity and 2 x 5 power in left  lower extremity. Skin: No rashes, lesions or ulcers Psychiatry: Judgement and insight appear normal. Mood & affect flat.     Data Reviewed: I have personally reviewed following labs and imaging studies  CBC:  Recent Labs Lab 09/26/15 2129 09/26/15 2156 09/27/15 0413 09/27/15 0727  WBC 6.4  --  6.6 6.0  NEUTROABS 3.3  --   --   --   HGB 13.1 14.6 13.2 13.2  HCT 40.3 43.0 40.2 39.9  MCV 92.9  --  93.5 92.6  PLT 173  --  171 162   Basic Metabolic Panel:  Recent Labs Lab 09/26/15 2129 09/26/15 2156 09/27/15 0413  NA 140 146* 141  K 3.3* 3.3* 3.5  CL 110 108 114*  CO2 21*  --  20*  GLUCOSE 124* 122* 89  BUN 11 12 12   CREATININE 1.09* 1.00 0.87  0.89  CALCIUM 9.6  --  9.3   GFR: Estimated Creatinine Clearance: 57.9 mL/min (by C-G formula based on Cr of 0.87). Liver Function Tests:  Recent Labs Lab 09/26/15 2129  AST 20  ALT 11*  ALKPHOS 82  BILITOT 0.7  PROT 7.8  ALBUMIN 4.1   No results for input(s): LIPASE, AMYLASE in the last 168 hours. No results for input(s): AMMONIA in the last 168 hours. Coagulation Profile:  Recent Labs Lab 09/26/15 2129  INR 1.17  Cardiac Enzymes: No results for input(s): CKTOTAL, CKMB, CKMBINDEX, TROPONINI in the last 168 hours. BNP (last 3 results) No results for input(s): PROBNP in the last 8760 hours. HbA1C:  Recent Labs  09/27/15 0727  HGBA1C 5.3   CBG: No results for input(s): GLUCAP in the last 168 hours. Lipid Profile:  Recent Labs  09/27/15 0413  CHOL 284*  HDL 40*  LDLCALC 215*  TRIG 144  CHOLHDL 7.1   Thyroid Function Tests: No results for input(s): TSH, T4TOTAL, FREET4, T3FREE, THYROIDAB in the last 72 hours. Anemia Panel: No results for input(s): VITAMINB12, FOLATE, FERRITIN, TIBC, IRON, RETICCTPCT in the last 72 hours.  Sepsis Labs: No results for input(s): PROCALCITON, LATICACIDVEN in the last 168 hours.  No results found for this or any previous visit (from the past 240 hour(s)).        Radiology Studies: No results found.      Scheduled Meds: . atorvastatin  80 mg Oral q1800  . baclofen  10 mg Oral BID  . clopidogrel  75 mg Oral Daily  . cyclobenzaprine  5 mg Oral BID  . cycloSPORINE  1 drop Both Eyes BID  . enoxaparin (LOVENOX) injection  40 mg Subcutaneous Q24H  . famotidine  20 mg Oral BID  . fluticasone  1 spray Each Nare Daily  . gabapentin  300 mg Oral TID  . latanoprost  1 drop Both Eyes QHS  . pantoprazole  40 mg Oral BID AC  . rOPINIRole  0.25 mg Oral TID  . sertraline  50 mg Oral Daily  . sodium chloride flush  3 mL Intravenous Q12H   Continuous Infusions:    LOS: 2 days    Time spent: 25 minutes.    [facility], MD [facility] Pager [phone]  If 7PM-7AM, please contact night-coverage [email or web address] Password [facility] 09/29/2015, 3:18 PM

## 2015-09-29 NOTE — Progress Notes (Signed)
Occupational Therapy Treatment Patient Details Name: [name] Moorman MRN: [id number] DOB: [birth year] Today's Date: 09/29/2015    History of present illness [name] Aerts is an 60 y.o. female history of depression, PTSD, alcohol abuse, stroke and GI hemorrhage, presenting with new onset left-sided weakness.  Found to have recurrent ischemic infarction most likely right subcortical MCA territory   OT comments  Pt making steady progress. Excellent participation with OT. Pt demonstrates L inattention during functional tasks. Focus of session on midline orientation and facilitating trunk control during functional tasks.  Continue to recommend CIR for D/C. Excellent CIR candidate. Will continue to follow acutely to maximize functional level of independence with ADL and mobility.   Follow Up Recommendations  CIR;Supervision/Assistance - 24 hour    Equipment Recommendations  Other (comment)    Recommendations for Other Services Rehab consult    Precautions / Restrictions Precautions Precautions: Fall       Mobility Bed Mobility Overal bed mobility: Needs Assistance Bed Mobility: Supine to Sit     Sup[facility];Min assist     General bed mobility comments: With increased time, pt able to get to EOB with min facilitation of trunk for trnasitional movements  Transfers Overall transfer level: Needs assistance Equipment used: 1 person hand held assist Transfers: Sit to/from [employer]GI CorporationStand;Stand Pivot Transfers Sit to Stand: Min assist Stand pivot transfers: Mod assist       General transfer comment: facilitation of weight shifts and midline trunk control during transitional movemetns    Balance Overall balance assessment: Needs assistance   Sitting balance-Leahy Scale: Fair   Postural control: Posterior lean;Right lateral lean Standing balance support: Bilateral upper extremity supported Standing balance-Leahy Scale: Poor                     ADL Overall ADL's :  Needs assistance/impaired                                     Functional mobility during ADLs: Moderate assistance General ADL Comments: Pt with R bias during mobility. Able to position at midline, but requires mod tactile cues to maintain. L inattention noted during ADL. Pt with posterior lean and R bias when sitting during dynamic tasks. Utilized rubbing lotion on LLE to facilitate anterior  and L lateral weight shift to facilitate midline control. Also incorporated PNF patterns with reaching to facilitate weight shifts and attention to L sdie during funcitonal activity      Vision                 Additional Comments: impaired. Poor visual attention. Unable to maintain attention to targets in R/L fields.  Estinguishes L target during B presentation   Perception     Praxis      Cognition   Behavior During Therapy: WFL for tasks assessed/performed Overall Cognitive Status: No family/caregiver present to determine baseline cognitive functioning                Problem Solving: Slow processing      Extremity/Trunk Assessment               Exercises     Shoulder Instructions       General Comments      Pertinent Vitals/ Pain       Pain Assessment: Faces Faces Pain Scale: No hurt  Home Living  Prior Functioning/Environment              Frequency Min 3X/week     Progress Toward Goals  OT Goals(current goals can now be found in the care plan section)  Progress towards OT goals: Progressing toward goals  Acute Rehab OT Goals Patient Stated Goal: To go to rehab OT Goal Formulation: With patient Time For Goal Achievement: 10/11/15 Potential to Achieve Goals: Good ADL Goals Pt Will Perform Eating: with adaptive utensils;sitting;with set-up Pt Will Perform Grooming: with min assist;sitting;with adaptive equipment Pt Will Perform Upper Body Bathing: with min  assist;sitting Pt Will Perform Lower Body Bathing: with min assist;sit to/from stand Pt Will Transfer to Toilet: with min assist;ambulating;bedside commode Pt Will Perform Toileting - Clothing Manipulation and hygiene: with min assist;sit to/from stand  Plan Discharge plan remains appropriate;Frequency needs to be updated    Co-evaluation                 End of Session Equipment Utilized During Treatment: Gait belt   Activity Tolerance Patient tolerated treatment well   Patient Left in chair;with call bell/phone within reach;with chair alarm set   Nurse Communication Mobility status        Time: 6045-4098 OT Time Calculation (min): 30 min  Charges: OT General Charges $OT Visit: 1 Procedure OT Treatments $Self Care/Home Management : 8-22 mins $Neuromuscular Re-education: 8-22 mins  Latravion Graves,HILLARY 09/29/2015, 11:21 AM   Luisa Dago, OTR/L  [phone] 09/29/2015

## 2015-09-29 NOTE — Progress Notes (Deleted)
Pt c/o SOB, "not being able to breath." pulse ox is 97 % on rm air, she is diaphoretic and clammy. BP is 148/ 81. Pulse is 80 and irregular. Dr Sibyl ParrHongagoli in to see pt.

## 2015-09-30 NOTE — Progress Notes (Signed)
Physical Therapy Treatment Patient Details Name: [name] MRN: [id number] DOB: 05-May-1955 Today's Date: 09/30/2015    History of Present Illness Trivia [name] is an 60 y.o. female history of depression, PTSD, alcohol abuse, stroke and GI hemorrhage, presenting with new onset left-sided weakness.  Found to have recurrent ischemic infarction most likely right subcortical MCA territory    PT Comments    Patient progressing with distance and some with L LE awareness in stance as well as with carryover of cues during session.  Continues to need frequent cues for L side awareness and balance in standing due to leaning to L.  Continue to recommend CIR level rehab at d/c.   Follow Up Recommendations  CIR     Equipment Recommendations  Rolling walker with 5" wheels    Recommendations for Other Services       Precautions / Restrictions Precautions Precautions: Fall    Mobility  Bed Mobility Overal bed mobility: Needs Assistance Bed Mobility: Supine to Sit     Supine to sit: Mod assist     General bed mobility comments: cues for technique with rail, assist for lfting trunk and scooting to EOB  Transfers Overall transfer level: Needs assistance Equipment used: Rolling walker (2 wheeled) Transfers: Sit to/from Stand Sit to Stand: Min assist         General transfer comment: assist for L foot placement and cues for pushing up with legs  Ambulation/Gait   Ambulation Distance (Feet): 35 Feet Assistive device: Rolling walker (2 wheeled) Gait Pattern/deviations: Step-to pattern;Decreased stride length;Shuffle;Decreased dorsiflexion - left;Decreased weight shift to right;Wide base of support;Ataxic     General Gait Details: mod cues for R weight shift, L knee extension in stance and facilitation for L trunk extension throughout and assist for walker management   Stairs            Wheelchair Mobility    Modified Rankin (Stroke Patients Only) Modified Rankin (Stroke  Patients Only) Pre-Morbid Rankin Score: Moderate disability Modified Rankin: Moderately severe disability     Balance Overall balance assessment: Needs assistance         Standing balance support: Bilateral upper extremity supported Standing balance-Leahy Scale: Poor                      Cognition Arousal/Alertness: Awake/alert Behavior During Therapy: WFL for tasks assessed/performed Overall Cognitive Status: No family/caregiver present to determine baseline cognitive functioning Area of Impairment: Following commands;Problem solving;Awareness;Attention   Current Attention Level: Sustained   Following Commands: Follows multi-step commands with increased time   Awareness: Intellectual Problem Solving: Slow processing      Exercises Other Exercises Other Exercises: L LE single leg bridging x 10 w/ 3 sec hold Other Exercises: L LE assisted hip/knee control activity in hooklying    General Comments        Pertinent Vitals/Pain Faces Pain Scale: Hurts little more Pain Location: back Pain Descriptors / Indicators: Aching Pain Intervention(s): Monitored during session;Repositioned    Home Living                      Prior Function            PT Goals (current goals can now be found in the care plan section) Progress towards PT goals: Progressing toward goals    Frequency  Min 4X/week    PT Plan Current plan remains appropriate    Co-evaluation  End of Session Equipment Utilized During Treatment: Gait belt Activity Tolerance: Patient limited by fatigue Patient left: in chair;with call bell/phone within reach;with chair alarm set     Time: [id number] PT Time Calculation (min) (ACUTE ONLY): 26 min  Charges:  $Gait Training: 8-22 mins $Therapeutic Activity: 8-22 mins                    G Codes:      Elray McgregorCynthia Azelie Noguera 09/30/2015, 5:52 PM Sheran Lawlessyndi Jp Eastham, PT [phone] 09/30/2015

## 2015-09-30 NOTE — Progress Notes (Addendum)
Inpatient Rehabilitation  At this time, I do not have a bed to offer Mrs. Zeis today.  I updated the patient . I also updated Windell MouldingEric Anterhaus, CSW via text message. We will continue to follow for possible IP rehab pending bed availability, medical readiness and appropriateness for CIR.   Please call if questions.  Weldon PickingSusan Sherita Decoste PT Inpatient Rehab Admissions Coordinator Cell [phone] Office [phone]

## 2015-09-30 NOTE — Progress Notes (Signed)
PROGRESS NOTE  [name]  ZOX:[id number] DOB: 1955-11-16  DOA: 09/26/2015 PCP: Lonia Blood, MD   Brief Narrative:  60 year old left-handed female, lives at home with family, independent of activities of daily living, PMH of CVA, HTN, HLD, GI bleed from gastric ulcer, hepatitis C, PTSD, depression, alcohol abuse, tobacco abuse, presented to [facility] ED on 09/26/15 with history of new onset left-sided weakness, lower extremity >upper extremity, slurred speech and facial droop which began at about noontime on day of admission. Admitted for stroke evaluation which has been completed. Neurology consulted. CIR consulted but did not have beds. DC to CIR versus SNF when bed available.   Assessment & Plan:   Principal Problem:   Acute ischemic stroke [facility]) Active Problems:   Essential hypertension   GERD   Hyperlipidemia   Left-sided weakness   Tobacco abuse   Acute CVA (cerebrovascular accident) (HCC)   Chronic back pain   ETOH abuse   Hypokalemia   PTSD (post-traumatic stress disorder)   Gastroesophageal reflux disease without esophagitis   Acute right brain stroke: Right corona radiata infarct secondary to small vessel disease source. - Resultant left facial droop, dysarthria and left hemiparesis. - CT brain 09/26/15: No acute intracranial pathology. - MRI head 09/27/15:16 mm acute ischemic nonhemorrhagic infarct involving the posterior right coronary data. No associated mass effect. Multiple remote lacunar infarcts involving bilateral external capsule/basal ganglia, bilateral thalami and right paramedian pons. - MRA head 09/27/15: Negative for large vessel occlusion. Atheromatous irregularity with superimposed mild to moderate multifocal stenosis. - Carotid Dopplers: Bilateral: No significant (1-39 percent) ICA stenosis. Antegrade vertebral flow. - 2-D echo: LVEF 55% and grade 2 diastolic dysfunction. No significant valvular abnormalities. - Hemoglobin A1c: 5.3 - LDL: 215. - Not on  antithrombotic prior to admission. As per neurology follow-up, she was on aspirin in the past but taken off due to GI bleed caused by gastric ulcer. Therefore switched to Plavix for secondary stroke prevention. - PT and OT recommend CIR - meets criteria but no beds available for 7/17 or 7/18.  - Pt will follow up with Dr. Roda Shutters at [facility] in about 2 months.   Hyperlipidemia - LDL 215, goal <70. Started atorvastatin 80 mg daily. Check LFTs in 4 weeks.  Essential hypertension - Controlled. Will cont to [facility].  Possible 3 mm small aneurysm or tortuosity of the proximal right AICA, seen on MRA head - Outpatient follow-up as deemed necessary.  Depression - Stable. Continue home medications.  Tobacco abuse - Cessation counseled. Agreeable.  Hypokalemia - Replaced. Will follow.  Atelectasis on chest x-ray - No signs and symptoms suggestive of pneumonia. Incentive spirometry.  History of GI bleed/GERD - Continue Pepcid.  Former alcohol use - States that she has not had alcohol to drink in 3 years.  Chronic neck and low back pain - Follows with pain management as outpatient. Continue home pain regimen. Controlled.   DVT prophylaxis: Lovenox Code Status: Full Family Communication: Discussed with patient. No family at bedside. Disposition Plan: CIR consulted but did not have beds. DC to CIR versus SNF when bed available.    Consultants:   Neurology-signed off 7/17.  Procedures:   2-D echo 09/27/15: Study Conclusions  - Left ventricle: The cavity size was normal. Wall thickness was  normal. The estimated ejection fraction was 55%. Wall motion was  normal; there were no regional wall motion abnormalities.  Features are consistent with a pseudonormal left ventricular  filling pattern, with concomitant abnormal relaxation and  increased filling pressure (grade 2  diastolic dysfunction). - Aortic valve: There was no stenosis. - Mitral valve: There was trivial regurgitation. -  Right ventricle: The cavity size was normal. Systolic function  was normal. - Pulmonary arteries: No complete TR doppler jet so unable to  estimate PA systolic pressure. - Inferior vena cava: The vessel was normal in size. The  respirophasic diameter changes were in the normal range (>= 50%),  consistent with normal central venous pressure.  Impressions:  - Normal LV size with EF 55%. Moderate diastolic dysfunction.  Normal RV size and systolic function. No significant valvular  abnormalities.   Carotid Doppler 09/27/15: Preliminary findings: Bilateral: No significant (1-39%) ICA stenosis. Antegrade vertebral flow.   Antimicrobials:   None    Subjective: Left-sided weakness: Improving per patient. No new complaints reported.  Objective:  Filed Vitals:   09/29/15 2126 09/30/15 0139 09/30/15 0501 09/30/15 1042  BP: 136/71 133/70 135/71 125/62  Pulse: 65 61 68 67  Temp: 98 F (36.7 C) 98.1 F (36.7 C) 98.6 F (37 C) 98.3 F (36.8 C)  TempSrc: Oral Oral Oral Oral  Resp: 20 18 20 18   Height:      Weight:      SpO2: 98% 98% 97% 98%    Intake/Output Summary (Last 24 hours) at 09/30/15 1113 Last data filed at 09/29/15 1806  Gross per 24 hour  Intake    720 ml  Output      0 ml  Net    720 ml   Filed Weights   09/26/15 2117 09/27/15 0102  Weight: 62.143 kg (137 lb) 61.961 kg (136 lb 9.6 oz)    Examination:  General exam: Pleasant middle-aged female sitting up comfortably in chair this morning.  Respiratory system: Clear to auscultation. Respiratory effort normal. Cardiovascular system: S1 & S2 heard, RRR. No JVD, murmurs, rubs, gallops or clicks. No pedal edema. Gastrointestinal system: Abdomen is nondistended, soft and nontender. No organomegaly or masses felt. Normal bowel sounds heard. Central nervous system: Alert and oriented. Mild dysarthria. Facial asymmetry +, Seems better. Extremities: Grade 5 x 5 power in right limbs. Grade 3 x 5 power in left  upper extremity and 2 x 5 power in left lower extremity. Skin: No rashes, lesions or ulcers Psychiatry: Judgement and insight appear normal. Mood & affect flat.     Data Reviewed: I have personally reviewed following labs and imaging studies  CBC:  Recent Labs Lab 09/26/15 2129 09/26/15 2156 09/27/15 0413 09/27/15 0727  WBC 6.4  --  6.6 6.0  NEUTROABS 3.3  --   --   --   HGB 13.1 14.6 13.2 13.2  HCT 40.3 43.0 40.2 39.9  MCV 92.9  --  93.5 92.6  PLT 173  --  171 162   Basic Metabolic Panel:  Recent Labs Lab 09/26/15 2129 09/26/15 2156 09/27/15 0413  NA 140 146* 141  K 3.3* 3.3* 3.5  CL 110 108 114*  CO2 21*  --  20*  GLUCOSE 124* 122* 89  BUN 11 12 12   CREATININE 1.09* 1.00 0.87  0.89  CALCIUM 9.6  --  9.3   GFR: Estimated Creatinine Clearance: 57.9 mL/min (by C-G formula based on Cr of 0.87). Liver Function Tests:  Recent Labs Lab 09/26/15 2129  AST 20  ALT 11*  ALKPHOS 82  BILITOT 0.7  PROT 7.8  ALBUMIN 4.1   No results for input(s): LIPASE, AMYLASE in the last 168 hours. No results for input(s): AMMONIA in the last 168 hours. Coagulation Profile:  Recent Labs Lab 09/26/15 2129  INR 1.17   Cardiac Enzymes: No results for input(s): CKTOTAL, CKMB, CKMBINDEX, TROPONINI in the last 168 hours. BNP (last 3 results) No results for input(s): PROBNP in the last 8760 hours. HbA1C: No results for input(s): HGBA1C in the last 72 hours. CBG: No results for input(s): GLUCAP in the last 168 hours. Lipid Profile: No results for input(s): CHOL, HDL, LDLCALC, TRIG, CHOLHDL, LDLDIRECT in the last 72 hours. Thyroid Function Tests: No results for input(s): TSH, T4TOTAL, FREET4, T3FREE, THYROIDAB in the last 72 hours. Anemia Panel: No results for input(s): VITAMINB12, FOLATE, FERRITIN, TIBC, IRON, RETICCTPCT in the last 72 hours.  Sepsis Labs: No results for input(s): PROCALCITON, LATICACIDVEN in the last 168 hours.  No results found for this or any  previous visit (from the past 240 hour(s)).       Radiology Studies: No results found.      Scheduled Meds: . atorvastatin  80 mg Oral q1800  . baclofen  10 mg Oral BID  . clopidogrel  75 mg Oral Daily  . cyclobenzaprine  5 mg Oral BID  . cycloSPORINE  1 drop Both Eyes BID  . enoxaparin (LOVENOX) injection  40 mg Subcutaneous Q24H  . famotidine  20 mg Oral BID  . fluticasone  1 spray Each Nare Daily  . gabapentin  300 mg Oral TID  . latanoprost  1 drop Both Eyes QHS  . pantoprazole  40 mg Oral BID AC  . rOPINIRole  0.25 mg Oral TID  . sertraline  50 mg Oral Daily  . sodium chloride flush  3 mL Intravenous Q12H   Continuous Infusions:    LOS: 3 days    Time spent: 10 minutes.    Haydee Salter, MD [facility] Pager [phone]  If 7PM-7AM, please contact night-coverage [email or web address] Password [email or web address] 09/30/2015, 11:13 AM

## 2015-09-30 NOTE — Clinical Social Work Note (Addendum)
Patient is a level 2 passar, CSW is awaiting passar number from [place].  Patient can not discharge to SNF until Passar number has been received.  Patient has been faxed out awaiting bed offers, CSW continuing to follow patient's progress.  5:00pm  CSW received level 2 Passar number, CSW also received phone call from patient's niece who would like patient to go to [facility] if CIR is not able to accept patient.  CSW left message for [facility] awaiting call back from them.  Ervin KnackEric R. Aayden Cefalu, MSW, LCSWA [phone] 09/30/2015 1:22 PM

## 2015-09-30 NOTE — NC FL2 (Signed)
[name] MEDICAID FL2 LEVEL OF CARE SCREENING TOOL     IDENTIFICATION  Patient Name: [name] Birthdate: 1956/02/02 Sex: female Admission Date (Current Location): 09/26/2015  [place]  [place] [id number] R Facility and Address:  [facility], [address], [place], [place] [address]      Provider Number:  [id number]  Attending Physician Name and Address:  Haydee SalterPhillip M Hobbs, MD  Relative Name and Phone Number:  Robinson,[phone]    Current Level of Care: Hospital Recommended Level of Care: Skilled Nursing Facility Prior Approval Number:    Date Approved/Denied:   PASRR Number:   [id number]  Discharge Plan: SNF    Current Diagnoses: Patient Active Problem List   Diagnosis Date Noted  . Chronic back pain   . ETOH abuse   . Hypokalemia   . PTSD (post-traumatic stress disorder)   . Gastroesophageal reflux disease without esophagitis   . Acute ischemic stroke (HCC) 09/27/2015  . Acute CVA (cerebrovascular accident) (HCC) 09/27/2015  . Tobacco abuse   . Left-sided weakness 09/26/2015  . Acute gastric ulcer   . GI bleed 05/22/2014  . Acute GI bleeding 05/22/2014  . Acute blood loss anemia 05/22/2014  . History of CVA (cerebrovascular accident) 05/22/2014  . Hyperlipidemia 05/22/2014  . Gastrointestinal hemorrhage with melena   . CVA (cerebral infarction) 10/09/2012  . Flank pain 10/09/2012  . Right pontine stroke (HCC) 10/09/2012  . ABDOMINAL PAIN OTHER SPECIFIED SITE 05/17/2010  . ALCOHOL ABUSE 06/16/2007  . COCAINE ABUSE 06/16/2007  . DEPRESSION 06/16/2007  . Essential hypertension 06/16/2007  . CEREBROVASCULAR ACCIDENT 06/16/2007  . ASTHMA 06/16/2007  . GERD 06/16/2007  . PANCREATITIS 11/09/2006  . ESOPHAGITIS, REFLUX 05/23/2006    Orientation RESPIRATION BLADDER Height & Weight     Self, Time, Place, Situation  Normal Continent Weight: 136 lb 9.6 oz (61.961 kg) Height:  5\' 4"  (162.6 cm)   BEHAVIORAL SYMPTOMS/MOOD NEUROLOGICAL BOWEL NUTRITION STATUS      Continent Diet (Cardiac)  AMBULATORY STATUS COMMUNICATION OF NEEDS Skin   Limited Assist Verbally Normal                       Personal Care Assistance Level of Assistance  Bathing, Dressing Bathing Assistance: Limited assistance   Dressing Assistance: Limited assistance     Functional Limitations Info  Sight, Hearing, Speech Sight Info: Adequate Hearing Info: Adequate Speech Info: Adequate    SPECIAL CARE FACTORS FREQUENCY  PT (By licensed PT), OT (By licensed OT)     PT Frequency: 5x a week OT Frequency: 5x a week            Contractures      Additional Factors Info  Psychotropic     Psychotropic Info: sertraline (ZOLOFT) tablet 50 mg         Current Medications (09/30/2015):  This is the current hospital active medication list Current Facility-Administered Medications  Medication Dose Route Frequency Provider Last Rate Last Dose  . acetaminophen (TYLENOL) tablet 650 mg  650 mg Oral Q6H PRN Debby Crosley, MD   650 mg at 09/27/15 1908   Or  . acetaminophen (TYLENOL) suppository 650 mg  650 mg Rectal Q6H PRN Debby Crosley, MD      . atorvastatin (LIPITOR) tablet 80 mg  80 mg Oral q1800 David L Rinehuls, PA-C   80 mg at 09/29/15 1723  . baclofen (LIORESAL) tablet 10 mg  10 mg Oral BID Gery Prayebby Crosley, MD   10  mg at 09/30/15 0955  . clopidogrel (PLAVIX) tablet 75 mg  75 mg Oral Daily Marvel Plan, MD   75 mg at 09/30/15 0955  . cyclobenzaprine (FLEXERIL) tablet 5 mg  5 mg Oral BID Gery Pray, MD   5 mg at 09/30/15 0956  . cycloSPORINE (RESTASIS) 0.05 % ophthalmic emulsion 1 drop  1 drop Both Eyes BID Gery Pray, MD   1 drop at 09/30/15 0958  . enoxaparin (LOVENOX) injection 40 mg  40 mg Subcutaneous Q24H Debby Crosley, MD   40 mg at 09/30/15 0809  . famotidine (PEPCID) tablet 20 mg  20 mg Oral BID Renaee Munda, RPH   20 mg at 09/30/15 0955  . fluticasone (FLONASE) 50 MCG/ACT nasal spray 1  spray  1 spray Each Nare Daily Debby Crosley, MD   1 spray at 09/29/15 1001  . gabapentin (NEURONTIN) capsule 300 mg  300 mg Oral TID Gery Pray, MD   300 mg at 09/30/15 0955  . latanoprost (XALATAN) 0.005 % ophthalmic solution 1 drop  1 drop Both Eyes QHS Debby Crosley, MD   1 drop at 09/29/15 2148  . metoprolol (LOPRESSOR) injection 5 mg  5 mg Intravenous Q4H PRN Debby Crosley, MD      . ondansetron (ZOFRAN) tablet 4 mg  4 mg Oral Q6H PRN Debby Crosley, MD       Or  . ondansetron (ZOFRAN) injection 4 mg  4 mg Intravenous Q6H PRN Debby Crosley, MD      . oxyCODONE-acetaminophen (PERCOCET) 7.5-325 MG per tablet 1 tablet  1 tablet Oral Q4H PRN Elease Etienne, MD   1 tablet at 09/30/15 0809  . pantoprazole (PROTONIX) EC tablet 40 mg  40 mg Oral BID AC Debby Crosley, MD   40 mg at 09/30/15 0809  . rOPINIRole (REQUIP) tablet 0.25 mg  0.25 mg Oral TID Gery Pray, MD   0.25 mg at 09/30/15 0955  . senna-docusate (Senokot-S) tablet 1 tablet  1 tablet Oral QHS PRN Debby Crosley, MD      . sertraline (ZOLOFT) tablet 50 mg  50 mg Oral Daily Debby Crosley, MD   50 mg at 09/30/15 0955  . sodium chloride flush (NS) 0.9 % injection 3 mL  3 mL Intravenous Q12H Debby Crosley, MD   3 mL at 09/30/15 1000     Discharge Medications: Please see discharge summary for a list of discharge medications.  Relevant Imaging Results:  Relevant Lab Results:   Additional Information SSN [id number]  [name], [place]

## 2015-10-01 ENCOUNTER — Inpatient Hospital Stay (HOSPITAL_COMMUNITY)
Admission: RE | Admit: 2015-10-01 | Discharge: 2015-10-26 | DRG: 057 | Disposition: A | Discharge status: Home Health | Payer: Medicaid Other | Source: Intra-hospital | Attending: Physical Medicine & Rehabilitation | Admitting: Physical Medicine & Rehabilitation

## 2015-10-01 DIAGNOSIS — F329 Major depressive disorder, single episode, unspecified: Secondary | ICD-10-CM | POA: Diagnosis present

## 2015-10-01 DIAGNOSIS — Z8719 Personal history of other diseases of the digestive system: Secondary | ICD-10-CM | POA: Diagnosis not present

## 2015-10-01 DIAGNOSIS — R0602 Shortness of breath: Secondary | ICD-10-CM | POA: Diagnosis present

## 2015-10-01 DIAGNOSIS — Z91018 Allergy to other foods: Secondary | ICD-10-CM | POA: Diagnosis not present

## 2015-10-01 DIAGNOSIS — Z823 Family history of stroke: Secondary | ICD-10-CM | POA: Diagnosis not present

## 2015-10-01 DIAGNOSIS — R531 Weakness: Secondary | ICD-10-CM | POA: Diagnosis present

## 2015-10-01 DIAGNOSIS — I69354 Hemiplegia and hemiparesis following cerebral infarction affecting left non-dominant side: Principal | ICD-10-CM

## 2015-10-01 DIAGNOSIS — Z880 Allergy status to penicillin: Secondary | ICD-10-CM | POA: Diagnosis not present

## 2015-10-01 DIAGNOSIS — B353 Tinea pedis: Secondary | ICD-10-CM | POA: Diagnosis present

## 2015-10-01 DIAGNOSIS — I6350 Cerebral infarction due to unspecified occlusion or stenosis of unspecified cerebral artery: Secondary | ICD-10-CM

## 2015-10-01 DIAGNOSIS — I6339 Cerebral infarction due to thrombosis of other cerebral artery: Secondary | ICD-10-CM | POA: Diagnosis not present

## 2015-10-01 DIAGNOSIS — K5900 Constipation, unspecified: Secondary | ICD-10-CM | POA: Diagnosis present

## 2015-10-01 DIAGNOSIS — G8194 Hemiplegia, unspecified affecting left nondominant side: Secondary | ICD-10-CM | POA: Diagnosis not present

## 2015-10-01 DIAGNOSIS — M549 Dorsalgia, unspecified: Secondary | ICD-10-CM | POA: Diagnosis present

## 2015-10-01 DIAGNOSIS — F4310 Post-traumatic stress disorder, unspecified: Secondary | ICD-10-CM | POA: Diagnosis present

## 2015-10-01 DIAGNOSIS — H548 Legal blindness, as defined in USA: Secondary | ICD-10-CM | POA: Diagnosis present

## 2015-10-01 DIAGNOSIS — F1010 Alcohol abuse, uncomplicated: Secondary | ICD-10-CM | POA: Diagnosis present

## 2015-10-01 DIAGNOSIS — Z886 Allergy status to analgesic agent status: Secondary | ICD-10-CM

## 2015-10-01 DIAGNOSIS — M25552 Pain in left hip: Secondary | ICD-10-CM

## 2015-10-01 DIAGNOSIS — M6289 Other specified disorders of muscle: Secondary | ICD-10-CM | POA: Diagnosis not present

## 2015-10-01 DIAGNOSIS — Z8711 Personal history of peptic ulcer disease: Secondary | ICD-10-CM | POA: Diagnosis not present

## 2015-10-01 DIAGNOSIS — Z90710 Acquired absence of both cervix and uterus: Secondary | ICD-10-CM

## 2015-10-01 DIAGNOSIS — Z79899 Other long term (current) drug therapy: Secondary | ICD-10-CM | POA: Diagnosis not present

## 2015-10-01 DIAGNOSIS — F17210 Nicotine dependence, cigarettes, uncomplicated: Secondary | ICD-10-CM | POA: Diagnosis present

## 2015-10-01 DIAGNOSIS — G8929 Other chronic pain: Secondary | ICD-10-CM | POA: Diagnosis present

## 2015-10-01 DIAGNOSIS — N3289 Other specified disorders of bladder: Secondary | ICD-10-CM

## 2015-10-01 LAB — BASIC METABOLIC PANEL
Anion gap: 7 (ref 5–15)
BUN: 11 mg/dL (ref 6–20)
CHLORIDE: 108 mmol/L (ref 101–111)
CO2: 25 mmol/L (ref 22–32)
CREATININE: 0.84 mg/dL (ref 0.44–1.00)
Calcium: 9.5 mg/dL (ref 8.9–10.3)
GFR calc Af Amer: >60 mL/min (ref >60)
GFR calc non Af Amer: >60 mL/min (ref >60)
GLUCOSE: 90 mg/dL (ref 65–99)
POTASSIUM: 3.8 mmol/L (ref 3.5–5.1)
Sodium: 140 mmol/L (ref 135–145)

## 2015-10-01 LAB — CBC
HCT: 40.8 % (ref 36.0–46.0)
HEMOGLOBIN: 13.4 g/dL (ref 12.0–15.0)
MCH: 30.4 pg (ref 26.0–34.0)
MCHC: 32.8 g/dL (ref 30.0–36.0)
MCV: 92.5 fL (ref 78.0–100.0)
PLATELETS: 153 10*3/uL (ref 150–400)
RBC: 4.41 MIL/uL (ref 3.87–5.11)
RDW: 13.1 % (ref 11.5–15.5)
WBC: 5.5 10*3/uL (ref 4.0–10.5)

## 2015-10-01 LAB — GLUCOSE, CAPILLARY: GLUCOSE-CAPILLARY: 99 mg/dL (ref 65–99)

## 2015-10-01 MED ORDER — LATANOPROST 0.005 % OP SOLN
1.0000 [drp] | Freq: Every day | OPHTHALMIC | Status: DC
  Administered 2015-10-01 – 2015-10-25 (×25): 1 [drp] via OPHTHALMIC
  Filled 2015-10-01 (×3): qty 2.5

## 2015-10-01 MED ORDER — METOCLOPRAMIDE HCL 5 MG/ML IJ SOLN
10.0000 mg | Freq: Once | INTRAMUSCULAR | Status: DC | PRN
Start: 2015-10-01 — End: 2015-10-26

## 2015-10-01 MED ORDER — ATORVASTATIN CALCIUM 80 MG PO TABS
80.0000 mg | ORAL_TABLET | Freq: Every day | ORAL | Status: DC
  Administered 2015-10-02 – 2015-10-25 (×24): 80 mg via ORAL
  Filled 2015-10-01 (×24): qty 1

## 2015-10-01 MED ORDER — ONDANSETRON HCL 4 MG/2ML IJ SOLN: 4.0000 mg | Freq: Four times a day (QID) | INTRAMUSCULAR | Status: DC | PRN

## 2015-10-01 MED ORDER — ONDANSETRON HCL 4 MG PO TABS: 4.0000 mg | ORAL_TABLET | Freq: Four times a day (QID) | ORAL | Status: DC | PRN

## 2015-10-01 MED ORDER — FAMOTIDINE 20 MG PO TABS
20.0000 mg | ORAL_TABLET | Freq: Two times a day (BID) | ORAL | Status: DC
  Administered 2015-10-01 – 2015-10-26 (×50): 20 mg via ORAL
  Filled 2015-10-01 (×40): qty 1
  Filled 2015-10-01: qty 2
  Filled 2015-10-01 (×10): qty 1

## 2015-10-01 MED ORDER — ACETAMINOPHEN 650 MG RE SUPP: 650.0000 mg | Freq: Four times a day (QID) | RECTAL | Status: DC | PRN

## 2015-10-01 MED ORDER — ENOXAPARIN SODIUM 40 MG/0.4ML ~~LOC~~ SOLN: 40.0000 mg | SUBCUTANEOUS | Status: DC

## 2015-10-01 MED ORDER — PANTOPRAZOLE SODIUM 40 MG PO TBEC
40.0000 mg | DELAYED_RELEASE_TABLET | Freq: Two times a day (BID) | ORAL | Status: DC
  Administered 2015-10-02 – 2015-10-26 (×49): 40 mg via ORAL
  Filled 2015-10-01 (×49): qty 1

## 2015-10-01 MED ORDER — FLUTICASONE PROPIONATE 50 MCG/ACT NA SUSP
1.0000 | Freq: Every day | NASAL | Status: DC
  Administered 2015-10-03 – 2015-10-26 (×20): 1 via NASAL
  Filled 2015-10-01 (×2): qty 16

## 2015-10-01 MED ORDER — CLOPIDOGREL BISULFATE 75 MG PO TABS
75.0000 mg | ORAL_TABLET | Freq: Every day | ORAL | Status: DC
  Administered 2015-10-01 – 2015-10-26 (×26): 75 mg via ORAL
  Filled 2015-10-01 (×26): qty 1

## 2015-10-01 MED ORDER — CYCLOSPORINE 0.05 % OP EMUL
1.0000 [drp] | Freq: Two times a day (BID) | OPHTHALMIC | Status: DC
  Administered 2015-10-01 – 2015-10-25 (×49): 1 [drp] via OPHTHALMIC
  Filled 2015-10-01 (×52): qty 1

## 2015-10-01 MED ORDER — GABAPENTIN 300 MG PO CAPS
300.0000 mg | ORAL_CAPSULE | Freq: Three times a day (TID) | ORAL | Status: DC
  Administered 2015-10-01 – 2015-10-26 (×74): 300 mg via ORAL
  Filled 2015-10-01 (×74): qty 1

## 2015-10-01 MED ORDER — CYCLOBENZAPRINE HCL 5 MG PO TABS
5.0000 mg | ORAL_TABLET | Freq: Two times a day (BID) | ORAL | Status: DC
  Administered 2015-10-01 – 2015-10-26 (×50): 5 mg via ORAL
  Filled 2015-10-01 (×50): qty 1

## 2015-10-01 MED ORDER — LACTATED RINGERS IV SOLN: INTRAVENOUS | Status: DC

## 2015-10-01 MED ORDER — ROPINIROLE HCL 0.25 MG PO TABS
0.2500 mg | ORAL_TABLET | Freq: Three times a day (TID) | ORAL | Status: DC
  Administered 2015-10-01 – 2015-10-26 (×73): 0.25 mg via ORAL
  Filled 2015-10-01 (×76): qty 1

## 2015-10-01 MED ORDER — OXYCODONE-ACETAMINOPHEN 7.5-325 MG PO TABS
1.0000 | ORAL_TABLET | ORAL | Status: DC | PRN
  Administered 2015-10-01 – 2015-10-07 (×24): 1 via ORAL
  Filled 2015-10-01 (×24): qty 1

## 2015-10-01 MED ORDER — SORBITOL 70 % SOLN
30.0000 mL | Freq: Every day | Status: DC | PRN
  Administered 2015-10-05 – 2015-10-19 (×2): 30 mL via ORAL
  Filled 2015-10-01 (×2): qty 30

## 2015-10-01 MED ORDER — ENOXAPARIN SODIUM 40 MG/0.4ML ~~LOC~~ SOLN
40.0000 mg | SUBCUTANEOUS | Status: DC
  Administered 2015-10-02 – 2015-10-26 (×25): 40 mg via SUBCUTANEOUS
  Filled 2015-10-01 (×25): qty 0.4

## 2015-10-01 MED ORDER — BACLOFEN 10 MG PO TABS
10.0000 mg | ORAL_TABLET | Freq: Two times a day (BID) | ORAL | Status: DC
  Administered 2015-10-01 – 2015-10-21 (×40): 10 mg via ORAL
  Filled 2015-10-01 (×40): qty 1

## 2015-10-01 MED ORDER — SERTRALINE HCL 50 MG PO TABS
50.0000 mg | ORAL_TABLET | Freq: Every day | ORAL | Status: DC
  Administered 2015-10-02 – 2015-10-26 (×25): 50 mg via ORAL
  Filled 2015-10-01 (×25): qty 1

## 2015-10-01 MED ORDER — ACETAMINOPHEN 325 MG PO TABS
650.0000 mg | ORAL_TABLET | Freq: Four times a day (QID) | ORAL | Status: DC | PRN
  Administered 2015-10-02: 650 mg via ORAL
  Filled 2015-10-01: qty 2

## 2015-10-01 NOTE — Progress Notes (Signed)
Received pt. As a new transfer from [facility].Pt. And her family were oriented to the rehab unit and protocol.Safety plan was explained,fall prevention plan was explained and signed by RN and pt's family.Welcome video was presented.

## 2015-10-01 NOTE — Progress Notes (Signed)
Fae Pippin Rehab Admission Coordinator Signed Physical Medicine and Rehabilitation PMR Pre-admission 10/01/2015 12:51 PM  Related encounter: ED to Hosp-Admission (Current) from 09/26/2015 in [facility]    Expand All Collapse All   PMR Admission Coordinator Pre-Admission Assessment  Patient: Quisha Mabie is an 60 y.o., female MRN: [id number] DOB: 06/22/1955 Height:  (162.6 cm) Weight: 61.961 kg (136 lb 9.6 oz)  Insurance Information HMO: PPO: PCP: IPA: 80/20: OTHER:  PRIMARY: Medicaid [place] Access Policy#: [id number] Subscriber: Self CM Name: Phone#: Fax#:  Pre-Cert#: as of 09/28/15 eligibility code [facility] Employer: Not Employed Benefits: Phone #: [phone] Name: via automated system  Eff. Date: Deduct: Out of Pocket Max: Life Max:  CIR: 100% per Medicaid guidelines  SNF:  Outpatient: Co-Pay:  Home Health: Co-Pay:  DME: Co-Pay:  Providers:   Medicaid Application Date: Case Manager:  Disability Application Date: Case Worker:   Emergency Contact Information Contact Information    Name Relation Home Work Mobile   Robinson,[name] Niece [phone]     Allen,Beth Relative [phone]     Ransom,Celeste Niece [phone]       Current Medical History  Patient Admitting Diagnosis: Nonhemorrhagic infarct posterior right corona radiata  History of Present Illness: Alvine Mostafa is a 60 y.o. right handed female with history of chronic back pain, tobacco and alcohol abuse, PTSD, GI bleed. Per chart review patient lives with nieces. Two-level home with bed and bath upstairs.Reported to use a single-point cane prior to admission. Presented 09/26/2015 with acute  onset of left-sided weakness. Cranial CT scan negative. 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 Multiple remote lacunar infarcts involving the bilateral external capsules/basal ganglia and the right paramedian pons. MRA of the head negative. Patient did not receive TPA. Echocardiogram with ejection fraction of 55% grade 2 diastolic dysfunction. Carotid Dopplers with no ICA stenosis. Neurology consulted maintained on Plavix for CVA prophylaxis. Subcutaneous Lovenox for DVT prophylaxis. Tolerating a regular consistency diet. Patient was admitted for a comprehensive rehabilitation program.  NIH Total: 14    Past Medical History  Past Medical History  Diagnosis Date  . Chronic back pain   . MDD (major depressive disorder) (HCC)   . Alcohol abuse   . PTSD (post-traumatic stress disorder)   . UTI (lower urinary tract infection)   . Colitis   . Stroke (HCC)   . Pancreatitis   . Hepatitis     Hepatitis C  . GI bleed   . Gastric ulcer   . Fatty liver   . CVA (cerebral infarction)     Family History  family history includes Cancer in her sister; Diabetes in her father; Stroke in her mother.  Prior Rehab/Hospitalizations:  Has the patient had major surgery during 100 days prior to admission? No  Current Medications   Current facility-administered medications:  . acetaminophen (TYLENOL) tablet 650 mg, 650 mg, Oral, Q6H PRN, 650 mg at 10/01/15 1025 **OR** acetaminophen (TYLENOL) suppository 650 mg, 650 mg, Rectal, Q6H PRN, Debby Crosley, MD . atorvastatin (LIPITOR) tablet 80 mg, 80 mg, Oral, q1800, David L Rinehuls, PA-C, 80 mg at 09/30/15 1712 . baclofen (LIORESAL) tablet 10 mg, 10 mg, Oral, BID, Debby Crosley, MD, 10 mg at 10/01/15 1025 . clopidogrel (PLAVIX) tablet 75 mg, 75 mg, Oral, Daily, Marvel Plan, MD, 75 mg at 10/01/15 1024 . cyclobenzaprine (FLEXERIL) tablet 5 mg, 5 mg,  Oral, BID, Debby Crosley, MD, 5 mg at 10/01/15 1024 . cycloSPORINE (RESTASIS)  0.05 % ophthalmic emulsion 1 drop, 1 drop, Both Eyes, BID, Debby Crosley, MD, 1 drop at 10/01/15 1025 . enoxaparin (LOVENOX) injection 40 mg, 40 mg, Subcutaneous, Q24H, Debby Crosley, MD, 40 mg at 10/01/15 0800 . famotidine (PEPCID) tablet 20 mg, 20 mg, Oral, BID, Kendra P Hiatt, RPH, 20 mg at 10/01/15 1025 . fluticasone (FLONASE) 50 MCG/ACT nasal spray 1 spray, 1 spray, Each Nare, Daily, Debby Crosley, MD, 1 spray at 09/29/15 1001 . gabapentin (NEURONTIN) capsule 300 mg, 300 mg, Oral, TID, Debby Crosley, MD, 300 mg at 10/01/15 1025 . latanoprost (XALATAN) 0.005 % ophthalmic solution 1 drop, 1 drop, Both Eyes, QHS, Debby Crosley, MD, 1 drop at 09/30/15 2202 . metoprolol (LOPRESSOR) injection 5 mg, 5 mg, Intravenous, Q4H PRN, Debby Crosley, MD . ondansetron (ZOFRAN) tablet 4 mg, 4 mg, Oral, Q6H PRN **OR** ondansetron (ZOFRAN) injection 4 mg, 4 mg, Intravenous, Q6H PRN, Debby Crosley, MD . oxyCODONE-acetaminophen (PERCOCET) 7.5-325 MG per tablet 1 tablet, 1 tablet, Oral, Q4H PRN, Elease Etienne, MD, 1 tablet at 10/01/15 1242 . pantoprazole (PROTONIX) EC tablet 40 mg, 40 mg, Oral, BID AC, Debby Crosley, MD, 40 mg at 10/01/15 0737 . rOPINIRole (REQUIP) tablet 0.25 mg, 0.25 mg, Oral, TID, Debby Crosley, MD, 0.25 mg at 10/01/15 1024 . senna-docusate (Senokot-S) tablet 1 tablet, 1 tablet, Oral, QHS PRN, Debby Crosley, MD . sertraline (ZOLOFT) tablet 50 mg, 50 mg, Oral, Daily, Debby Crosley, MD, 50 mg at 10/01/15 1025 . sodium chloride flush (NS) 0.9 % injection 3 mL, 3 mL, Intravenous, Q12H, Debby Crosley, MD, 3 mL at 10/01/15 1000  Patients Current Diet: Diet Heart Room service appropriate?: Yes; Fluid consistency:: Thin  Precautions / Restrictions Precautions Precautions: Fall Restrictions Weight Bearing Restrictions: No   Has the patient had 2 or more falls or a fall with injury in the past  year?No  Prior Activity Level Limited Community (1-2x/wk): Patient lives with niece, who drives patient to go shopping and get her hair done.  Home Assistive Devices / Equipment Home Assistive Devices/Equipment: None Home Equipment: Cane - single point, Shower seat  Prior Device Use: Indicate devices/aids used by the patient prior to current illness, exacerbation or injury? Quad cane  Prior Functional Level Prior Function Level of Independence: Needs assistance Gait / Transfers Assistance Needed: reports was independent with ambulation and toileting ADL's / Homemaking Assistance Needed: assist with bathing and dressing per PT eval. Pt reports independent with BADLs during OT eval.  Self Care: Did the patient need help bathing, dressing, using the toilet or eating? Needed some help  Indoor Mobility: Did the patient need assistance with walking from room to room (with or without device)? Independent  Stairs: Did the patient need assistance with internal or external stairs (with or without device)? Independent  Functional Cognition: Did the patient need help planning regular tasks such as shopping or remembering to take medications? Needed some help  Current Functional Level Cognition  Overall Cognitive Status: No family/caregiver present to determine baseline cognitive functioning Current Attention Level: Sustained Orientation Level: Oriented X4 Following Commands: Follows multi-step commands with increased time   Extremity Assessment (includes Sensation/Coordination)  Upper Extremity Assessment: LUE deficits/detail LUE Deficits / Details: AROM elbow, hand WFL, strength 3+/5 for elbow and hand. Shoulder AAROM grossly WFL, but unable to lift antigravity. Pt reports no changes in sensation LUE Sensation: decreased light touch LUE Coordination: decreased fine motor, decreased gross motor  Lower Extremity Assessment: Defer to PT evaluation LLE Deficits / Details: AAROM  stiffness in ankle,  otherwise WFL, strength hip flexion 3-/5, knee extension 3+/5, ankle DF 0/5    ADLs  Overall ADL's : Needs assistance/impaired Eating/Feeding: Moderate assistance, With adaptive utensils, Sitting Eating/Feeding Details (indicate cue type and reason): Simulated by oral care activity. Educated pt on use of red foam with utensils to increase independence with self feeding. Grooming: Moderate assistance, With adaptive equipment, Sitting, Oral care, Wash/dry face Grooming Details (indicate cue type and reason): Pt able to brush teeth with use of red foam to build up handle; used R hand for brushing but attempting to use L hand to assist. Upper Body Bathing: Moderate assistance, Sitting Lower Body Bathing: Maximal assistance, Sit to/from stand Upper Body Dressing : Moderate assistance, Sitting Lower Body Dressing: Maximal assistance, Sit to/from stand Functional mobility during ADLs: Moderate assistance General ADL Comments: Pt with R bias during mobility. Able to position at midline, but requires mod tactile cues to maintain. L inattention noted during ADL. Pt with posterior lean and R bias when sitting during dynamic tasks. Utilized rubbing lotion on LLE to facilitate anterior and L lateral weight shift to facilitate midline control. Also incorporated PNF patterns with reaching to facilitate weight shifts and attention to L sdie during funcitonal activity    Mobility  Overal bed mobility: Needs Assistance Bed Mobility: Supine to Sit Supine to sit: Mod assist General bed mobility comments: cues for technique with rail, assist for lfting trunk and scooting to EOB    Transfers  Overall transfer level: Needs assistance Equipment used: Rolling walker (2 wheeled) Transfers: Sit to/from Stand Sit to Stand: Min assist Stand pivot transfers: Mod assist General transfer comment: assist for L foot placement and cues for pushing up with legs    Ambulation / Gait / Stairs  / Wheelchair Mobility  Ambulation/Gait Ambulation/Gait assistance: Max assist, Mod assist Ambulation Distance (Feet): 35 Feet Assistive device: Rolling walker (2 wheeled) Gait Pattern/deviations: Step-to pattern, Decreased stride length, Shuffle, Decreased dorsiflexion - left, Decreased weight shift to right, Wide base of support, Ataxic General Gait Details: mod cues for R weight shift, L knee extension in stance and facilitation for L trunk extension throughout and assist for walker management    Posture / Balance Dynamic Sitting Balance Sitting balance - Comments: sitting kateral weight shifts with facilitation in L ribs for L trunk activation and R weight shift and anterior pelvic tilt; also worked some in chair and on bed on reciprocal scooting with assist Balance Overall balance assessment: Needs assistance Sitting-balance support: Feet supported, Bilateral upper extremity supported Sitting balance-Leahy Scale: Fair Sitting balance - Comments: sitting kateral weight shifts with facilitation in L ribs for L trunk activation and R weight shift and anterior pelvic tilt; also worked some in chair and on bed on reciprocal scooting with assist Postural control: Posterior lean, Right lateral lean Standing balance support: Bilateral upper extremity supported Standing balance-Leahy Scale: Poor Standing balance comment: min A with walker for static balance    Special needs/care consideration BiPAP/CPAP: No CPM: No Continuous Drip IV: No Dialysis: No  Life Vest: No Oxygen: No Special Bed: No Trach Size: No Wound Vac (area): No  Skin: WDL  Bowel mgmt: 09/26/15 Bladder mgmt: incontinent  Diabetic mgmt: HgbA1c 5.3     Previous Home Environment Living Arrangements: Other relatives Available Help at Discharge: Family, Available 24 hours/day Type of Home: House Home Layout: Two level, Bed/bath upstairs Alternate Level Stairs-Rails:  Right Alternate Level Stairs-Number of Steps: 14 Home Access: Stairs to enter Enter[employer] CorporationEntrance Stairs-Number of Steps: 1 Bathroom Shower/Tub:  Tub/shower unit Firefighter: Standard Home Care Services: No  Discharge Living Setting Plans for Discharge Living Setting: Lives with (comment) (Niece, [name]) Type of Home at Discharge: House Discharge Home Layout: Two level, Bed/bath upstairs Alternate Level Stairs-Rails: Right Alternate Level Stairs-Number of Steps: 7 then landing and 5 more  Discharge Home Access: Level entry Discharge Bathroom Shower/Tub: Tub/shower unit, Curtain Discharge Bathroom Toilet: Standard Discharge Bathroom Accessibility: Yes How Accessible: Accessible via walker Does the patient have any problems obtaining your medications?: No  Social/Family/Support Systems Patient Roles: Other (Comment) (aunt) Contact Information: 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 Anticipated Caregiver: Niece, Dione Housekeeper cell:[phone] Ability/Limitations of Caregiver: none Caregiver Availability: 24/7 Discharge Plan Discussed with Primary Caregiver: Yes Is Caregiver In Agreement with Plan?: Yes Does Caregiver/Family have Issues with Lodging/Transportation while Pt is in Rehab?: No  Goals/Additional Needs Patient/Family Goal for Rehab: PT/OT Min assist; SLP Mod I  Expected length of stay: 19-23 days  Cultural Considerations: None Dietary Needs: Heart Healthy Diet Equipment Needs: TBD Special Service Needs: None Pt/Family Agrees to Admission and willing to participate: Yes Program Orientation Provided & Reviewed with Pt/Caregiver Including Roles & Responsibilities: Yes  Decrease burden of Care through IP rehab admission: No  Possible need for SNF placement upon discharge: No  Patient Condition: This patient's medical and functional status has changed since the consult dated: 09/28/15 in which the Rehabilitation Physician determined and documented that the patient's  condition is appropriate for intensive rehabilitative care in an inpatient rehabilitation facility. See "History of Present Illness" (above) for medical update. Functional changes are: Mod assist gait for 35 feet with rolling walker. Patient's medical and functional status update has been discussed with the Rehabilitation physician and patient remains appropriate for inpatient rehabilitation. Will admit to inpatient rehab today.  Preadmission Screen Completed By: Fae Pippin, 10/01/2015 12:51 PM ______________________________________________________________________  Discussed status with Dr. Wynn Banker on 10/01/15 at 1315 and received telephone approval for admission today.  Admission Coordinator: Fae Pippin, time 1315/Date 10/01/15          Cosigned by: Erick Colace, MD at 10/01/2015 1:19 PM  Revision History     Date/Time User Provider Type Action   10/01/2015 1:19 PM Erick Colace, MD Physician Cosign   10/01/2015 1:16 PM Fae Pippin Rehab Admission Coordinator Sign

## 2015-10-01 NOTE — Progress Notes (Deleted)
Pt complaint of nausea, vomited clear liquid x1. Paged Md to request antiemetic. Awaiting call back.

## 2015-10-01 NOTE — Progress Notes (Addendum)
Inpatient Rehabilitation  I have an IP Rehab bed available to offer patient today and await medical clearance prior to proceeding with admission.    Update: I have paged Dr. Melynda RippleHobbs to request medical clearance and he has cleared patient for IP Rehab today.  Have also notified CSW and RN CM.  Plan to follow up with patient shortly.     Charlane FerrettiMelissa Kastin Cerda, M.A., CCC/SLP Admission Coordinator  [facility]  Cell [phone]

## 2015-10-01 NOTE — Progress Notes (Signed)
Ankit Karis Juba, MD Physician Signed Physical Medicine and Rehabilitation Consult Note 09/28/2015 5:59 AM  Related encounter: ED to Hosp-Admission (Current) from 09/26/2015 in [facility]    Expand All Collapse All        Physical Medicine and Rehabilitation Consult Reason for Consult: Nonhemorrhagic infarct posterior right corona radiata Referring Physician: [facility]   HPI: [name] is a 60 y.o. right handed female with history of chronic back pain, tobacco and alcohol abuse, PTSD, GI bleed. Per chart review patient lives with nieces. Two-level home with bed and bath upstairs. Does not have 24/7 assist. Presented 09/26/2015 with acute onset of left-sided weakness. Cranial CT scan negative. 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 Multiple remote lacunar infarcts involving the bilateral external capsules/basal ganglia and the right paramedian pons. MRA of the head negative. Patient did not receive TPA. Echocardiogram with ejection fraction of 55% grade 2 diastolic dysfunction. Carotid Dopplers with no ICA stenosis. Neurology consulted maintained on aspirin for CVA prophylaxis. Subcutaneous Lovenox for DVT prophylaxis. Tolerating a regular consistency diet.   Review of Systems  Constitutional: Negative for fever and chills.  HENT: Negative for hearing loss.  Eyes: Negative for blurred vision and double vision.  Respiratory: Positive for cough.   Occasional shortness of breath with exertion  Cardiovascular: Negative for chest pain, palpitations and leg swelling.  Gastrointestinal: Positive for constipation. Negative for nausea and vomiting.  Genitourinary: Negative for dysuria and hematuria.  Musculoskeletal: Positive for back pain.  Skin: Negative for rash.  Neurological: Positive for weakness and headaches. Negative for seizures and loss of consciousness.    Psychiatric/Behavioral: Positive for depression.  All other systems reviewed and are negative.  Past Medical History  Diagnosis Date  . Chronic back pain   . MDD (major depressive disorder) (HCC)   . Alcohol abuse   . PTSD (post-traumatic stress disorder)   . UTI (lower urinary tract infection)   . Colitis   . Stroke (HCC)   . Pancreatitis   . Hepatitis     Hepatitis C  . GI bleed   . Gastric ulcer   . Fatty liver   . CVA (cerebral infarction)    Past Surgical History  Procedure Laterality Date  . Esophagogastroduodenoscopy endoscopy    . Esophagogastroduodenoscopy N/A 05/23/2014    Procedure: ESOPHAGOGASTRODUODENOSCOPY (EGD); Surgeon: Beverley Fiedler, MD; Location: [facility] ENDOSCOPY; Service: Endoscopy; Laterality: N/A;  . Appendectomy    . Gall stone removed    . Abdominal hysterectomy    . Colonoscopy with propofol N/A 08/13/2015    Procedure: COLONOSCOPY WITH PROPOFOL; Surgeon: Rachael Fee, MD; Location: [facility] ENDOSCOPY; Service: Endoscopy; Laterality: N/A;  . Esophagogastroduodenoscopy (egd) with propofol N/A 08/13/2015    Procedure: ESOPHAGOGASTRODUODENOSCOPY (EGD) WITH PROPOFOL; Surgeon: Rachael Fee, MD; Location: [facility] ENDOSCOPY; Service: Endoscopy; Laterality: N/A;   Family History  Problem Relation Age of Onset  . Stroke Mother   . Cancer Sister     type unknown  . Diabetes Father    Social History:  reports that she has been smoking Cigarettes. She has been smoking about 0.50 packs per day. She quit smokeless tobacco use about 37 years ago. Her smokeless tobacco use included Chew. She reports that she does not drink alcohol or use illicit drugs. Allergies:  Allergies  Allergen Reactions  . Banana Other (See Comments)    Welts on skin and sores in mouth  . Ibuprofen Other (See Comments)  blisters  . Penicillins Other (See Comments)     Blister Has patient had a PCN reaction causing immediate rash, facial/tongue/throat swelling, SOB or lightheadedness with hypotension: no Has patient had a PCN reaction causing severe rash involving mucus membranes or skin necrosis: yes - blisters Has patient had a PCN reaction that required hospitalization no Has patient had a PCN reaction occurring within the last 10 years: no If all of the above answers are "NO", then may proceed with Cephalosporin use.    Medications Prior to Admission  Medication Sig Dispense Refill  . albuterol (PROVENTIL HFA;VENTOLIN HFA) 108 (90 BASE) MCG/ACT inhaler Inhale 2 puffs into the lungs every 8 (eight) hours as needed for wheezing or shortness of breath.    . baclofen (LIORESAL) 10 MG tablet Take 10 mg by mouth 2 (two) times daily.    . cyclobenzaprine (FLEXERIL) 5 MG tablet Take 1 tablet (5 mg total) by mouth 2 (two) times daily. 30 tablet 0  . cycloSPORINE (RESTASIS) 0.05 % ophthalmic emulsion Place 1 drop into both eyes 2 (two) times daily.    Marland Kitchen doxycycline (VIBRAMYCIN) 100 MG capsule Take 100 mg by mouth 2 (two) times daily.    . fluticasone (FLONASE) 50 MCG/ACT nasal spray Place 1 spray into the nose daily as needed for allergies.   2  . gabapentin (NEURONTIN) 300 MG capsule Take 300 mg by mouth 3 (three) times daily.   1  . loratadine (CLARITIN) 10 MG tablet Take 10 mg by mouth daily.  2  . oxyCODONE-acetaminophen (PERCOCET) 7.5-325 MG per tablet Take 1 tablet by mouth every 4 (four) hours as needed for pain.   0  . pantoprazole (PROTONIX) 40 MG tablet Take 1 tablet (40 mg total) by mouth 2 (two) times daily before a meal. 60 tablet 0  . rOPINIRole (REQUIP) 0.25 MG tablet Take 0.25 mg by mouth 3 (three) times daily.    . sertraline (ZOLOFT) 50 MG tablet Take 50 mg by mouth daily.    Marland Kitchen SIMBRINZA 1-0.2 % SUSP Place 1 drop into both eyes 3 (three) times daily.   4  . temazepam  (RESTORIL) 15 MG capsule Take 15 mg by mouth at bedtime.    . Travoprost, BAK Free, (TRAVATAN) 0.004 % SOLN ophthalmic solution Place 1 drop into both eyes at bedtime.       Home: Home Living Family/patient expects to be discharged to:: Private residence Living Arrangements: Other relatives (neices) Available Help at Discharge: Family, Available 24 hours/day Type of Home: House Home Access: Stairs to enter Secretary/administrator of Steps: 1 Home Layout: Two level, Bed/bath upstairs Alternate Level Stairs-Number of Steps: 14 Alternate Level Stairs-Rails: Right Bathroom Shower/Tub: Engineer, manufacturing systems: Standard Home Equipment: Cane - single point, Banker History: Prior Function Level of Independence: Needs assistance Gait / Transfers Assistance Needed: reports was independent with ambulation and toileting ADL's / Homemaking Assistance Needed: assist with bathing and dressing per PT eval. Pt reports independent with BADLs during OT eval. Functional Status:  Mobility: Bed Mobility Overal bed mobility: Needs Assistance Bed Mobility: Supine to Sit Supine to sit: HOB elevated, Min assist, Mod assist General bed mobility comments: Pt OOB in chair upon arrival. Transfers Overall transfer level: Needs assistance Equipment used: Rolling walker (2 wheeled) Transfers: Sit to/from Stand Sit to Stand: Mod assist General transfer comment: Pt declining functional mobility at this time secondary to just ambulating with PT. Ambulation/Gait Ambulation/Gait assistance: Mod assist, Max assist Ambulation Distance (Feet): 15 Feet  Assistive device: Rolling walker (2 wheeled) Gait Pattern/deviations: Step-to pattern, Decreased dorsiflexion - left, Decreased stride length, Shuffle General Gait Details: dragging L foot, assist for weight shift and cues to shift to R, assist for walker propulsion, leaning to L throughout, cues for L LE extension in stance     ADL: ADL Overall ADL's : Needs assistance/impaired Eating/Feeding: Moderate assistance, With adaptive utensils, Sitting Eating/Feeding Details (indicate cue type and reason): Simulated by oral care activity. Educated pt on use of red foam with utensils to increase independence with self feeding. Grooming: Moderate assistance, With adaptive equipment, Sitting, Oral care, Wash/dry face Grooming Details (indicate cue type and reason): Pt able to brush teeth with use of red foam to build up handle; used R hand for brushing but attempting to use L hand to assist. Upper Body Bathing: Moderate assistance, Sitting Lower Body Bathing: Maximal assistance, Sit to/from stand Upper Body Dressing : Moderate assistance, Sitting Lower Body Dressing: Maximal assistance, Sit to/from stand General ADL Comments: Did not assess transfers at this time; pt just finished ambulating with PT and politely declined due to fatigue. Educated pt on proper positioning of LUE, incorporating LUE into functional activities, use of red foam for increased independence with self feeding and grooming tasks.  Cognition: Cognition Overall Cognitive Status: No family/caregiver present to determine baseline cognitive functioning Orientation Level: Oriented X4 Cognition Arousal/Alertness: Awake/alert Behavior During Therapy: WFL for tasks assessed/performed Overall Cognitive Status: No family/caregiver present to determine baseline cognitive functioning Area of Impairment: Following commands, Problem solving Following Commands: Follows multi-step commands with increased time Problem Solving: Slow processing  Blood pressure 119/64, pulse 69, temperature 98.3 F (36.8 C), temperature source Oral, resp. rate 20, height 5\' 4"  (1.626 m), weight 61.961 kg (136 lb 9.6 oz), SpO2 100 %. Physical Exam  Vitals reviewed. Constitutional: She is oriented to person, place, and time. She appears well-developed and well-nourished.  HENT:   Head: Normocephalic and atraumatic.  Eyes: Conjunctivae and EOM are normal.  Neck: Normal range of motion. Neck supple. No thyromegaly present.  Cardiovascular: Normal rate and regular rhythm.  Respiratory: Effort normal and breath sounds normal. No respiratory distress.  GI: Soft. Bowel sounds are normal. She exhibits no distension.  Musculoskeletal: She exhibits no edema or tenderness.  Neurological: She is alert and oriented to person, place, and time.  Speech is a bit hoarse but intelligible.  Follows simple commands.  Fair awareness of deficits with complaints of back pain. Sensation diminished to light touch LLE 3+ DTRs LLE Left facial weakness Motor: RUE: 4+/5 proximal to distal LUE: 3/5 proximal to distal LLE: 2-/5 hip flexion, knee extension, 1/5 ankle dorsi/plantar flexion  Skin: Skin is warm and dry.  Psychiatric: Her affect is blunt. She is slowed.     Lab Results Last 24 Hours    Results for orders placed or performed during the hospital encounter of 09/26/15 (from the past 24 hour(s))  CBC Status: None   Collection Time: 09/27/15 7:27 AM  Result Value Ref Range   WBC 6.0 4.0 - 10.5 K/uL   RBC 4.31 3.87 - 5.11 MIL/uL   Hemoglobin 13.2 12.0 - 15.0 g/dL   HCT 16.1 09.6 - 04.5 %   MCV 92.6 78.0 - 100.0 fL   MCH 30.6 26.0 - 34.0 pg   MCHC 33.1 30.0 - 36.0 g/dL   RDW 40.9 81.1 - 91.4 %   Platelets 162 150 - 400 K/uL      Imaging Results (Last 48 hours)  Ct Head Wo Contrast  09/26/2015 CLINICAL DATA: 60 year old female with left-sided weakness. History of prior stroke. EXAM: CT HEAD WITHOUT CONTRAST TECHNIQUE: Contiguous axial images were obtained from the base of the skull through the vertex without intravenous contrast. COMPARISON: Head CT dated 05/22/2014 FINDINGS: The ventricles and the sulci are appropriate in size for the patient's age. There is no intracranial hemorrhage. No midline shift or mass effect  identified. The gray-white matter differentiation is preserved. There is diffuse mucoperiosteal thickening and partial opacification of the left maxillary sinus. There is old-appearing fracture of the left lamina Propecia. The remainder of the visualized paranasal sinuses and mastoid air cells are clear. The calvarium is intact. IMPRESSION: No acute intracranial pathology. If symptoms persist and there are no contraindications, MRI may provide better evaluation if clinically indicated These results were called by telephone at the time of interpretation on 09/26/2015 at 11:11 pm to Dr. Bethann Berkshire , who verbally acknowledged these results. Electronically Signed By: Elgie Collard M.D. On: 09/26/2015 23:13   Mr Maxine Glenn Head Wo Contrast  09/27/2015 CLINICAL DATA: Initial evaluation for acute left-sided weakness. EXAM: MRI HEAD WITHOUT CONTRAST MRA HEAD WITHOUT CONTRAST TECHNIQUE: Multiplanar, multiecho pulse sequences of the brain and surrounding structures were obtained without intravenous contrast. Angiographic images of the head were obtained using MRA technique without contrast. COMPARISON: Prior CT from 09/26/2015 as well as previous MRI from 10/09/2012. FINDINGS: MRI HEAD FINDINGS Cerebral volume within normal limits for patient age. Minimal patchy T2/FLAIR hyperintensity within the periventricular and deep white matter, most consistent with chronic small vessel ischemic disease, mild for age. Chronic small vessel ischemic type changes present within the pons is well. Multiple remote lacunar infarcts involving the bilateral external capsule/lentiform nuclei as well as the basal ganglia and thalami. Remote lacunar infarct within the right paramedian pons. No areas of remote cortical infarction appreciated. There is a 16 mm focus of restricted diffusion within the posterior right corona radiata extending inferiorly towards the right external capsule, consistent with an acute ischemic infarct (series 4,  image 31). No associated mass effect or hemorrhage. No other acute infarct. Gray-white matter differentiation otherwise maintained. Major intracranial vascular flow voids are preserved. No acute or chronic intracranial hemorrhage. No mass lesion, midline shift, or mass effect. No hydrocephalus. No extra-axial fluid collection. Major dural sinuses are grossly patent. Craniocervical junction within normal limits mild degenerative disc bulging noted at C2-3 without stenosis. Visualized upper cervical spine otherwise unremarkable. Pituitary gland normal. No acute abnormality about the globes and orbits. Mucosal thickening with fluid level within the left maxillary sinus. Mild scattered mucosal thickening within the ethmoidal air cells and right maxillary sinus. Trace opacity left mastoid air cells. Inner ear structures normal. Bone marrow signal intensity within normal limits. No scalp soft tissue abnormality. MRA HEAD FINDINGS ANTERIOR CIRCULATION: Visualized distal cervical segments of the internal carotid arteries are patent with antegrade flow. Petrous segments widely patent. Mild scattered atheromatous irregularity within the cavernous/ supraclinoid ICAs without focal high-grade stenosis right A1 segment hypoplastic with multiple regularity. Dominant left A1 segment widely patent. The and inferior communicating artery within normal limits. Left anterior cerebral artery widely patent. Right anterior cerebral arteries somewhat diminutive and attenuated but is also patent to its distal aspect. M1 segments patent without up mild atheromatous irregularity within the mid and distal M1 segments without focal high-grade stenosis. MCA bifurcations within normal limits. Multifocal atheromatous irregularity with stenoses involving the MCA branches bilaterally, slightly worse on the left. POSTERIOR CIRCULATION: Left vertebral artery dominant and  patent to the vertebrobasilar junction. Diminutive right vertebral artery  terminates in PICA. Multi focal atheromatous irregularity with mild to moderate multi focal narrowing present within the proximal-mid basilar artery. Basilar artery widely patent distally. Superior cerebral arteries patent with multifocal atheromatous irregularity. Both of the posterior cerebral arteries arise from the basilar artery and are patent to their distal aspects. Multifocal atheromatous irregularity with stenoses within the PCAs bilaterally. Small 3 mm focal outpouching arising from the right/posterior aspect of mid basilar artery, potentially at the takeoff of the anterior inferior cerebral artery (series 6, image 88). Finding may reflect a small aneurysm. IMPRESSION: MRI HEAD IMPRESSION: 1. 16 mm acute ischemic nonhemorrhagic infarct involving the posterior right corona radiata. No associated mass effect. 2. Multiple remote lacunar infarcts involving the bilateral external capsules/basal ganglia, bilateral thalami, and the right paramedian pons. 3. Mild chronic small vessel ischemic disease. 4. Acute left maxillary sinusitis. MRA HEAD IMPRESSION: 1. Negative for large vessel occlusion. 2. Atheromatous irregularity with superimposed mild to moderate multi focal stenoses. Overall, these changes have progressed from prior. 3. Extensive atheromatous irregularity with stenoses involving the PCAs bilaterally, also progressed from prior. 4. Moderate multifocal atheromatous irregularity with stenoses involving the MCA branches bilaterally, left greater than right. 5. 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 Attention at follow-up recommended. Electronically Signed By: Rise Mu M.D. On: 09/27/2015 06:58   Mr Brain Wo Contrast  09/27/2015 CLINICAL DATA: Initial evaluation for acute left-sided weakness. EXAM: MRI HEAD WITHOUT CONTRAST MRA HEAD WITHOUT CONTRAST  TECHNIQUE: Multiplanar, multiecho pulse sequences of the brain and surrounding structures were obtained without intravenous contrast. Angiographic images of the head were obtained using MRA technique without contrast. COMPARISON: Prior CT from 09/26/2015 as well as previous MRI from 10/09/2012. FINDINGS: MRI HEAD FINDINGS Cerebral volume within normal limits for patient age. Minimal patchy T2/FLAIR hyperintensity within the periventricular and deep white matter, most consistent with chronic small vessel ischemic disease, mild for age. Chronic small vessel ischemic type changes present within the pons is well. Multiple remote lacunar infarcts involving the bilateral external capsule/lentiform nuclei as well as the basal ganglia and thalami. Remote lacunar infarct within the right paramedian pons. No areas of remote cortical infarction appreciated. There is a 16 mm focus of restricted diffusion within the posterior right corona radiata extending inferiorly towards the right external capsule, consistent with an acute ischemic infarct (series 4, image 31). No associated mass effect or hemorrhage. No other acute infarct. Gray-white matter differentiation otherwise maintained. Major intracranial vascular flow voids are preserved. No acute or chronic intracranial hemorrhage. No mass lesion, midline shift, or mass effect. No hydrocephalus. No extra-axial fluid collection. Major dural sinuses are grossly patent. Craniocervical junction within normal limits mild degenerative disc bulging noted at C2-3 without stenosis. Visualized upper cervical spine otherwise unremarkable. Pituitary gland normal. No acute abnormality about the globes and orbits. Mucosal thickening with fluid level within the left maxillary sinus. Mild scattered mucosal thickening within the ethmoidal air cells and right maxillary sinus. Trace opacity left mastoid air cells. Inner ear structures normal. Bone marrow signal intensity within normal limits. No  scalp soft tissue abnormality. MRA HEAD FINDINGS ANTERIOR CIRCULATION: Visualized distal cervical segments of the internal carotid arteries are patent with antegrade flow. Petrous segments widely patent. Mild scattered atheromatous irregularity within the cavernous/ supraclinoid ICAs without focal high-grade stenosis right A1 segment hypoplastic with multiple regularity.  Dominant left A1 segment widely patent. The and inferior communicating artery within normal limits. Left anterior cerebral artery widely patent. Right anterior cerebral arteries somewhat diminutive and attenuated but is also patent to its distal aspect. M1 segments patent without up mild atheromatous irregularity within the mid and distal M1 segments without focal high-grade stenosis. MCA bifurcations within normal limits. Multifocal atheromatous irregularity with stenoses involving the MCA branches bilaterally, slightly worse on the left. POSTERIOR CIRCULATION: Left vertebral artery dominant and patent to the vertebrobasilar junction. Diminutive right vertebral artery terminates in PICA. Multi focal atheromatous irregularity with mild to moderate multi focal narrowing present within the proximal-mid basilar artery. Basilar artery widely patent distally. Superior cerebral arteries patent with multifocal atheromatous irregularity. Both of the posterior cerebral arteries arise from the basilar artery and are patent to their distal aspects. Multifocal atheromatous irregularity with stenoses within the PCAs bilaterally. Small 3 mm focal outpouching arising from the right/posterior aspect of mid basilar artery, potentially at the takeoff of the anterior inferior cerebral artery (series 6, image 88). Finding may reflect a small aneurysm. IMPRESSION: MRI HEAD IMPRESSION: 1. 16 mm acute ischemic nonhemorrhagic infarct involving the posterior right corona radiata. No associated mass effect. 2. Multiple remote lacunar infarcts involving the bilateral external  capsules/basal ganglia, bilateral thalami, and the right paramedian pons. 3. Mild chronic small vessel ischemic disease. 4. Acute left maxillary sinusitis. MRA HEAD IMPRESSION: 1. Negative for large vessel occlusion. 2. Atheromatous irregularity with superimposed mild to moderate multi focal stenoses. Overall, these changes have progressed from prior. 3. Extensive atheromatous irregularity with stenoses involving the PCAs bilaterally, also progressed from prior. 4. Moderate multifocal atheromatous irregularity with stenoses involving the MCA branches bilaterally, left greater than right. 5. 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 Attention at follow-up recommended. Electronically Signed By: Rise Mu M.D. On: 09/27/2015 06:58   Dg Chest Port 1 View  09/26/2015 CLINICAL DATA: Initial evaluation for acute weakness. EXAM: PORTABLE CHEST 1 VIEW COMPARISON: Prior radiograph from 05/22/2014. FINDINGS: Cardiac and mediastinal silhouettes are within normal limits. Atheromatous plaque within the aortic arch. Lungs are hypoinflated. Patchy and hazy bibasilar opacities, right greater than left, favored to reflect atelectasis. Superimposed infection, particularly at the right lung base could be considered in the correct clinical setting. No pulmonary edema or pleural effusion. No pneumothorax. No acute osseous abnormality. IMPRESSION: 1. Shallow lung inflation with patchy and hazy bibasilar opacities, right greater than left. Atelectasis is favored. Superimposed infection, particularly at the right lung base, could be considered in the correct clinical setting. 2. Aortic atherosclerosis. Electronically Signed By: Rise Mu M.D. On: 09/26/2015 22:07     Assessment/Plan: Diagnosis: Nonhemorrhagic infarct posterior right corona radiata Labs and images  independently reviewed. Records reviewed and summated above. Stroke: Continue secondary stroke prophylaxis and Risk Factor Modification listed below:  Antiplatelet therapy Blood Pressure Management: Continue current medication with prn's with permisive HTN per primary team Statin Agent Tobacco abuse:  Left sided hemiparesis: fit for orthosis to prevent contractures (resting hand splint for day, wrist cock up splint at night, PRAFO, etc) Motor recovery: Fluoxetine   1. Does the need for close, 24 hr/day medical supervision in concert with the patient's rehab needs make it unreasonable for this patient to be served in a less intensive setting? Potentially  2. Co-Morbidities requiring supervision/potential complications: chronic back pain (Biofeedback training with therapies to help reduce reliance  on opiate pain medications, monitor pain control during therapies, and sedation at rest and titrate to maximum efficacy to ensure participation and gains in therapies), tobacco and alcohol abuse (counsel), PTSD (cognitive therapy), GI bleed (cont to monitor), hypokalemia (continue to monitor and replete as necessary) 3. Due to safety, disease management, pain management and patient education, does the patient require 24 hr/day rehab nursing? Yes 4. Does the patient require coordinated care of a physician, rehab nurse, PT (1-2 hrs/day, 5 days/week), OT (1-2 hrs/day, 5 days/week) and SLP (1-2 hrs/day, 5 days/week) to address physical and functional deficits in the context of the above medical diagnosis(es)? Potentially Addressing deficits in the following areas: balance, endurance, locomotion, strength, transferring, bathing, dressing, toileting, speech and psychosocial support 5. Can the patient actively participate in an intensive therapy program of at least 3 hrs of therapy per day at least 5 days per week? Yes 6. The potential for patient to make measurable gains while on inpatient rehab is  excellent 7. Anticipated functional outcomes upon discharge from inpatient rehab are min assist with PT, min assist with OT, independent and modified independent with SLP. 8. Estimated rehab length of stay to reach the above functional goals is: 19-23 days. 9. Does the patient have adequate social supports and living environment to accommodate these discharge functional goals? Potentially 10. Anticipated D/C setting: Home 11. Anticipated post D/C treatments: HH therapy and Home excercise program 12. Overall Rehab/Functional Prognosis: good  RECOMMENDATIONS: This patient's condition is appropriate for continued rehabilitative care in the following setting: CIR if pt has caregive support at discharge. However, there are no beds available at this time.  Patient has agreed to participate in recommended program. Potentially Note that insurance prior authorization may be required for reimbursement for recommended care.  Comment: Rehab Admissions Coordinator to follow up.  Maryla MorrowAnkit Patel, MD 09/28/2015       Revision History     Date/Time User Provider Type Action   09/28/2015 11:09 AM Ankit Karis JubaAnil Patel, MD Physician Sign   09/28/2015 6:33 AM Charlton Amoraniel J Angiulli, PA-C Physician Assistant Pend   View Details Report       Routing History     Date/Time From To Method   09/28/2015 11:09 AM Ankit Karis JubaAnil Patel, MD Rometta EmeryMohammad L Garba, MD In Basket

## 2015-10-01 NOTE — Clinical Social Work Note (Signed)
CSW received confirmation that patient will be accepted to CIR today and there is a bed available.  CSW updated patient and her niece via phone, CSW to sign off please reconsult if other social work needs arise.  Ervin KnackEric R. Mckenzie Toruno, MSW, Theresia MajorsLCSWA [phone] 10/01/2015 11:32 AM

## 2015-10-01 NOTE — PMR Pre-admission (Signed)
PMR Admission Coordinator Pre-Admission Assessment  Patient: [name] is an 60 y.o., female MRN: [id number] DOB: 06-Oct-1955 Height: 5\' 4"  (162.6 cm) Weight: 61.961 kg (136 lb 9.6 oz)              Insurance Information HMO:     PPO:      PCP:      IPA:      80/20:      OTHER:  PRIMARY: Medicaid [place] [employer]      Policy#: [id number]      Subscriber: Self CM Name:       Phone#:      Fax#:  Pre-Cert#: as of 09/28/15 [facility]      Employer: Not Employed Benefits:  Phone #: [phone]     Name: via automated system  Eff. Date:      Deduct:       Out of Pocket Max:       Life Max:  CIR:  100% per Medicaid guidelines      SNF:  Outpatient:      Co-Pay:  Home Health:       Co-Pay:  DME:      Co-Pay:  Providers:   Medicaid Application Date:       Case Manager:  Disability Application Date:       Case Worker:   Emergency Contact Information Contact Information    Name Relation Home Work Mobile   Robinson,[name] Niece [phone]     Allen,Beth Relative [phone]     Ransom,Celeste Niece [phone]       Current Medical History  Patient Admitting Diagnosis: Nonhemorrhagic infarct posterior right corona radiata  History of Present Illness: [name] is a 60 y.o. right handed female with history of chronic back pain, tobacco and alcohol abuse, PTSD, GI bleed. Per chart review patient lives with nieces. Two-level home with bed and bath upstairs.Reported to use a single-point cane prior to admission. Presented 09/26/2015 with acute onset of left-sided weakness. Cranial CT scan negative. 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 Multiple remote lacunar infarcts involving the bilateral external capsules/basal ganglia and the right paramedian pons. MRA of the head negative. Patient did not receive TPA. Echocardiogram with ejection fraction of 55% grade 2 diastolic dysfunction. Carotid Dopplers with no ICA  stenosis. Neurology consulted maintained on Plavix for CVA prophylaxis. Subcutaneous Lovenox for DVT prophylaxis. Tolerating a regular consistency diet. Patient was admitted for a comprehensive rehabilitation program.  NIH Total: 14    Past Medical History  Past Medical History  Diagnosis Date  . Chronic back pain   . MDD (major depressive disorder) (HCC)   . Alcohol abuse   . PTSD (post-traumatic stress disorder)   . UTI (lower urinary tract infection)   . Colitis   . Stroke (HCC)   . Pancreatitis   . Hepatitis     Hepatitis C  . GI bleed   . Gastric ulcer   . Fatty liver   . CVA (cerebral infarction)     Family History  family history includes Cancer in her sister; Diabetes in her father; Stroke in her mother.  Prior Rehab/Hospitalizations:  Has the patient had major surgery during 100 days prior to admission? No  Current Medications   Current facility-administered medications:  .  acetaminophen (TYLENOL) tablet 650 mg, 650 mg, Oral, Q6H PRN, 650 mg at 10/01/15 1025 **OR** acetaminophen (TYLENOL) suppository 650 mg, 650 mg, Rectal, Q6H PRN, Gery Prayebby Crosley, MD .  atorvastatin (LIPITOR) tablet 80 mg, 80 mg, Oral, q1800, David L Rinehuls, PA-C, 80 mg at 09/30/15 1712 .  baclofen (LIORESAL) tablet 10 mg, 10 mg, Oral, BID, Debby Crosley, MD, 10 mg at 10/01/15 1025 .  clopidogrel (PLAVIX) tablet 75 mg, 75 mg, Oral, Daily, Marvel Plan, MD, 75 mg at 10/01/15 1024 .  cyclobenzaprine (FLEXERIL) tablet 5 mg, 5 mg, Oral, BID, Debby Crosley, MD, 5 mg at 10/01/15 1024 .  cycloSPORINE (RESTASIS) 0.05 % ophthalmic emulsion 1 drop, 1 drop, Both Eyes, BID, Debby Crosley, MD, 1 drop at 10/01/15 1025 .  enoxaparin (LOVENOX) injection 40 mg, 40 mg, Subcutaneous, Q24H, Debby Crosley, MD, 40 mg at 10/01/15 0800 .  famotidine (PEPCID) tablet 20 mg, 20 mg, Oral, BID, Kendra P Hiatt, RPH, 20 mg at 10/01/15 1025 .  fluticasone (FLONASE) 50 MCG/ACT nasal spray 1 spray, 1 spray, Each Nare, Daily, Debby  Crosley, MD, 1 spray at 09/29/15 1001 .  gabapentin (NEURONTIN) capsule 300 mg, 300 mg, Oral, TID, Debby Crosley, MD, 300 mg at 10/01/15 1025 .  latanoprost (XALATAN) 0.005 % ophthalmic solution 1 drop, 1 drop, Both Eyes, QHS, Debby Crosley, MD, 1 drop at 09/30/15 2202 .  metoprolol (LOPRESSOR) injection 5 mg, 5 mg, Intravenous, Q4H PRN, Debby Crosley, MD .  ondansetron (ZOFRAN) tablet 4 mg, 4 mg, Oral, Q6H PRN **OR** ondansetron (ZOFRAN) injection 4 mg, 4 mg, Intravenous, Q6H PRN, Debby Crosley, MD .  oxyCODONE-acetaminophen (PERCOCET) 7.5-325 MG per tablet 1 tablet, 1 tablet, Oral, Q4H PRN, Elease Etienne, MD, 1 tablet at 10/01/15 1242 .  pantoprazole (PROTONIX) EC tablet 40 mg, 40 mg, Oral, BID AC, Debby Crosley, MD, 40 mg at 10/01/15 0737 .  rOPINIRole (REQUIP) tablet 0.25 mg, 0.25 mg, Oral, TID, Debby Crosley, MD, 0.25 mg at 10/01/15 1024 .  senna-docusate (Senokot-S) tablet 1 tablet, 1 tablet, Oral, QHS PRN, Debby Crosley, MD .  sertraline (ZOLOFT) tablet 50 mg, 50 mg, Oral, Daily, Debby Crosley, MD, 50 mg at 10/01/15 1025 .  sodium chloride flush (NS) 0.9 % injection 3 mL, 3 mL, Intravenous, Q12H, Debby Crosley, MD, 3 mL at 10/01/15 1000  Patients Current Diet: Diet Heart Room service appropriate?: Yes; Fluid consistency:: Thin  Precautions / Restrictions Precautions Precautions: Fall Restrictions Weight Bearing Restrictions: No   Has the patient had 2 or more falls or a fall with injury in the past year?No  Prior Activity Level Limited Community (1-2x/wk): Patient lives with niece, who drives patient to go shopping and get her hair done.  Home Assistive Devices / Equipment Home Assistive Devices/Equipment: None Home Equipment: Cane - single point, Shower seat  Prior Device Use: Indicate devices/aids used by the patient prior to current illness, exacerbation or injury? Quad cane  Prior Functional Level Prior Function Level of Independence: Needs assistance Gait / Transfers  Assistance Needed: reports was independent with ambulation and toileting ADL's / Homemaking Assistance Needed: assist with bathing and dressing per PT eval. Pt reports independent with BADLs during OT eval.  Self Care: Did the patient need help bathing, dressing, using the toilet or eating? Needed some help  Indoor Mobility: Did the patient need assistance with walking from room to room (with or without device)? Independent  Stairs: Did the patient need assistance with internal or external stairs (with or without device)? Independent  Functional Cognition: Did the patient need help planning regular tasks such as shopping or remembering to take medications? Needed some help  Current Functional Level Cognition  Overall Cognitive Status: No family/caregiver  present to determine baseline cognitive functioning Current Attention Level: Sustained Orientation Level: Oriented X4 Following Commands: Follows multi-step commands with increased time    Extremity Assessment (includes Sensation/Coordination)  Upper Extremity Assessment: LUE deficits/detail LUE Deficits / Details: AROM elbow, hand WFL, strength  3+/5 for elbow and hand. Shoulder AAROM grossly WFL, but unable to lift antigravity. Pt reports no changes in sensation LUE Sensation: decreased light touch LUE Coordination: decreased fine motor, decreased gross motor  Lower Extremity Assessment: Defer to PT evaluation LLE Deficits / Details: AAROM stiffness in ankle, otherwise WFL, strength hip flexion 3-/5, knee extension 3+/5, ankle DF 0/5    ADLs  Overall ADL's : Needs assistance/impaired Eating/Feeding: Moderate assistance, With adaptive utensils, Sitting Eating/Feeding Details (indicate cue type and reason): Simulated by oral care activity. Educated pt on use of red foam with utensils to increase independence with self feeding. Grooming: Moderate assistance, With adaptive equipment, Sitting, Oral care, Wash/dry face Grooming Details  (indicate cue type and reason): Pt able to brush teeth with use of red foam to build up handle; used R hand for brushing but attempting to use L hand to assist. Upper Body Bathing: Moderate assistance, Sitting Lower Body Bathing: Maximal assistance, Sit to/from stand Upper Body Dressing : Moderate assistance, Sitting Lower Body Dressing: Maximal assistance, Sit to/from stand Functional mobility during ADLs: Moderate assistance General ADL Comments: Pt with R bias during mobility. Able to position at midline, but requires mod tactile cues to maintain. L inattention noted during ADL. Pt with posterior lean and R bias when sitting during dynamic tasks. Utilized rubbing lotion on LLE to facilitate anterior  and L lateral weight shift to facilitate midline control. Also incorporated PNF patterns with reaching to facilitate weight shifts and attention to L sdie during funcitonal activity    Mobility  Overal bed mobility: Needs Assistance Bed Mobility: Supine to Sit Supine to sit: Mod assist General bed mobility comments: cues for technique with rail, assist for lfting trunk and scooting to EOB    Transfers  Overall transfer level: Needs assistance Equipment used: Rolling walker (2 wheeled) Transfers: Sit to/from Stand Sit to Stand: Min assist Stand pivot transfers: Mod assist General transfer comment: assist for L foot placement and cues for pushing up with legs    Ambulation / Gait / Stairs / Wheelchair Mobility  Ambulation/Gait Ambulation/Gait assistance: Max assist, Mod assist Ambulation Distance (Feet): 35 Feet Assistive device: Rolling walker (2 wheeled) Gait Pattern/deviations: Step-to pattern, Decreased stride length, Shuffle, Decreased dorsiflexion - left, Decreased weight shift to right, Wide base of support, Ataxic General Gait Details: mod cues for R weight shift, L knee extension in stance and facilitation for L trunk extension throughout and assist for walker management     Posture / Balance Dynamic Sitting Balance Sitting balance - Comments: sitting kateral weight shifts with facilitation in L ribs for L trunk activation and R weight shift and anterior pelvic tilt; also worked some in chair and on bed on reciprocal scooting with assist Balance Overall balance assessment: Needs assistance Sitting-balance support: Feet supported, Bilateral upper extremity supported Sitting balance-Leahy Scale: Fair Sitting balance - Comments: sitting kateral weight shifts with facilitation in L ribs for L trunk activation and R weight shift and anterior pelvic tilt; also worked some in chair and on bed on reciprocal scooting with assist Postural control: Posterior lean, Right lateral lean Standing balance support: Bilateral upper extremity supported Standing balance-Leahy Scale: Poor Standing balance comment: min A with walker for static balance  Special needs/care consideration BiPAP/CPAP: No CPM: No Continuous Drip IV: No Dialysis: No         Life Vest: No Oxygen: No Special Bed: No Trach Size: No Wound Vac (area): No       Skin: WDL                                Bowel mgmt: 09/26/15 Bladder mgmt: incontinent  Diabetic mgmt: HgbA1c 5.3     Previous Home Environment Living Arrangements: Other relatives Available Help at Discharge: Family, Available 24 hours/day Type of Home: House Home Layout: Two level, Bed/bath upstairs Alternate Level Stairs-Rails: Right Alternate Level Stairs-Number of Steps: 14 Home Access: Stairs to enter Secretary/administrator of Steps: 1 Bathroom Shower/Tub: Engineer, manufacturing systems: Standard Home Care Services: No  Discharge Living Setting Plans for Discharge Living Setting: Lives with (comment) (Niece, [name]) Type of Home at Discharge: House Discharge Home Layout: Two level, Bed/bath upstairs Alternate Level Stairs-Rails: Right Alternate Level Stairs-Number of Steps: 7 then landing and 5 more  Discharge Home Access:  Level entry Discharge Bathroom Shower/Tub: Tub/shower unit, Curtain Discharge Bathroom Toilet: Standard Discharge Bathroom Accessibility: Yes How Accessible: Accessible via walker Does the patient have any problems obtaining your medications?: No  Social/Family/Support Systems Patient Roles: Other (Comment) (aunt) Contact Information: 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 Anticipated Caregiver: Niece, Dione Housekeeper [phone] Ability/Limitations of Caregiver: none Caregiver Availability: 24/7 Discharge Plan Discussed with Primary Caregiver: Yes Is Caregiver In Agreement with Plan?: Yes Does Caregiver/Family have Issues with Lodging/Transportation while Pt is in Rehab?: No  Goals/Additional Needs Patient/Family Goal for Rehab: PT/OT Min assist; SLP Mod I  Expected length of stay: 19-23 days  Cultural Considerations: None Dietary Needs: Heart Healthy Diet Equipment Needs: TBD Special Service Needs: None Pt/Family Agrees to Admission and willing to participate: Yes Program Orientation Provided & Reviewed with Pt/Caregiver Including Roles  & Responsibilities: Yes  Decrease burden of Care through IP rehab admission: No  Possible need for SNF placement upon discharge: No  Patient Condition: This patient's medical and functional status has changed since the consult dated: 09/28/15 in which the Rehabilitation Physician determined and documented that the patient's condition is appropriate for intensive rehabilitative care in an inpatient rehabilitation facility. See "History of Present Illness" (above) for medical update. Functional changes are: Mod assist gait for 35 feet with rolling walker. Patient's medical and functional status update has been discussed with the Rehabilitation physician and patient remains appropriate for inpatient rehabilitation. Will admit to inpatient rehab today.  Preadmission Screen Completed By:  Fae Pippin, 10/01/2015 12:51  PM ______________________________________________________________________   Discussed status with Dr. Wynn Banker on 10/01/15 at 1315 and received telephone approval for admission today.  Admission Coordinator:  Fae Pippin, time 1315/Date 10/01/15

## 2015-10-02 ENCOUNTER — Inpatient Hospital Stay (HOSPITAL_COMMUNITY): Payer: Medicaid Other | Admitting: Occupational Therapy

## 2015-10-02 ENCOUNTER — Inpatient Hospital Stay (HOSPITAL_COMMUNITY): Payer: Medicaid Other | Admitting: Speech Pathology

## 2015-10-02 ENCOUNTER — Inpatient Hospital Stay (HOSPITAL_COMMUNITY): Payer: Medicaid Other | Admitting: Physical Therapy

## 2015-10-02 ENCOUNTER — Inpatient Hospital Stay (HOSPITAL_COMMUNITY): Payer: Medicaid Other

## 2015-10-02 DIAGNOSIS — I6339 Cerebral infarction due to thrombosis of other cerebral artery: Secondary | ICD-10-CM

## 2015-10-02 LAB — CBC WITH DIFFERENTIAL/PLATELET
BASOS PCT: 0 %
Basophils Absolute: 0 10*3/uL (ref 0.0–0.1)
EOS ABS: 0.1 10*3/uL (ref 0.0–0.7)
Eosinophils Relative: 2 %
HEMATOCRIT: 44.5 % (ref 36.0–46.0)
Hemoglobin: 15 g/dL (ref 12.0–15.0)
LYMPHS ABS: 2.2 10*3/uL (ref 0.7–4.0)
Lymphocytes Relative: 48 %
MCH: 31.4 pg (ref 26.0–34.0)
MCHC: 33.7 g/dL (ref 30.0–36.0)
MCV: 93.1 fL (ref 78.0–100.0)
MONO ABS: 0.6 10*3/uL (ref 0.1–1.0)
MONOS PCT: 12 %
Neutro Abs: 1.8 10*3/uL (ref 1.7–7.7)
Neutrophils Relative %: 38 %
Platelets: 144 10*3/uL — ABNORMAL LOW (ref 150–400)
RBC: 4.78 MIL/uL (ref 3.87–5.11)
RDW: 13.1 % (ref 11.5–15.5)
WBC: 4.6 10*3/uL (ref 4.0–10.5)

## 2015-10-02 LAB — COMPREHENSIVE METABOLIC PANEL WITH GFR
ALT: 20 U/L (ref 14–54)
AST: 27 U/L (ref 15–41)
Albumin: 4.2 g/dL (ref 3.5–5.0)
Alkaline Phosphatase: 92 U/L (ref 38–126)
Anion gap: 6 (ref 5–15)
BUN: 11 mg/dL (ref 6–20)
CO2: 27 mmol/L (ref 22–32)
Calcium: 9.8 mg/dL (ref 8.9–10.3)
Chloride: 109 mmol/L (ref 101–111)
Creatinine, Ser: 0.86 mg/dL (ref 0.44–1.00)
GFR calc Af Amer: >60 mL/min (ref >60)
GFR calc non Af Amer: >60 mL/min (ref >60)
Glucose, Bld: 81 mg/dL (ref 65–99)
Potassium: 4.3 mmol/L (ref 3.5–5.1)
Sodium: 142 mmol/L (ref 135–145)
Total Bilirubin: 0.7 mg/dL (ref 0.3–1.2)
Total Protein: 7.5 g/dL (ref 6.5–8.1)

## 2015-10-02 LAB — GLUCOSE, CAPILLARY
Glucose-Capillary: 89 mg/dL (ref 65–99)
Glucose-Capillary: 77 mg/dL (ref 65–99)

## 2015-10-02 MED ORDER — MUSCLE RUB 10-15 % EX CREA
TOPICAL_CREAM | Freq: Two times a day (BID) | CUTANEOUS | Status: DC | PRN
  Filled 2015-10-02: qty 85

## 2015-10-02 MED ORDER — TROLAMINE SALICYLATE 10 % EX CREA
TOPICAL_CREAM | Freq: Two times a day (BID) | CUTANEOUS | Status: DC | PRN
  Filled 2015-10-02: qty 85

## 2015-10-02 NOTE — Evaluation (Signed)
Speech Language Pathology Assessment and Plan  Patient Details  Name: [name] MRN: [id number] Date of Birth: 10/24/1955  SLP Diagnosis: Dysarthria;Cognitive Impairments  Rehab Potential: Good ELOS: 18 to 21 days    Today's Date: 10/02/2015 SLP Individual Time: 1100-1200 SLP Individual Time Calculation (min): 60 min   Problem List:  Patient Active Problem List   Diagnosis Date Noted  . Chronic back pain   . ETOH abuse   . Hypokalemia   . PTSD (post-traumatic stress disorder)   . Gastroesophageal reflux disease without esophagitis   . Acute ischemic stroke ([place]) 09/27/2015  . Acute CVA (cerebrovascular accident) ([place]) 09/27/2015  . Tobacco abuse   . Left-sided weakness 09/26/2015  . Acute gastric ulcer   . GI bleed 05/22/2014  . Acute GI bleeding 05/22/2014  . Acute blood loss anemia 05/22/2014  . History of CVA (cerebrovascular accident) 05/22/2014  . Hyperlipidemia 05/22/2014  . Gastrointestinal hemorrhage with melena   . CVA (cerebral infarction) 10/09/2012  . Flank pain 10/09/2012  . Right pontine stroke ([place]) 10/09/2012  . ABDOMINAL PAIN OTHER SPECIFIED SITE 05/17/2010  . ALCOHOL ABUSE 06/16/2007  . COCAINE ABUSE 06/16/2007  . DEPRESSION 06/16/2007  . Essential hypertension 06/16/2007  . CEREBROVASCULAR ACCIDENT 06/16/2007  . ASTHMA 06/16/2007  . GERD 06/16/2007  . PANCREATITIS 11/09/2006  . ESOPHAGITIS, REFLUX 05/23/2006   Past Medical History:  Past Medical History  Diagnosis Date  . Chronic back pain   . MDD (major depressive disorder) ([place])   . Alcohol abuse   . PTSD (post-traumatic stress disorder)   . UTI (lower urinary tract infection)   . Colitis   . Stroke ([place])   . Pancreatitis   . Hepatitis     Hepatitis C  . GI bleed   . Gastric ulcer   . Fatty liver   . CVA (cerebral infarction)    Past Surgical History:  Past Surgical History  Procedure Laterality Date  . Esophagogastroduodenoscopy endoscopy    . Esophagogastroduodenoscopy N/A  05/23/2014    Procedure: ESOPHAGOGASTRODUODENOSCOPY (EGD);  Surgeon: Jerene Bears, MD;  Location[facility];  Service: Endoscopy;  Laterality: N/A;  . Appendectomy    . Gall stone removed    . Abdominal hysterectomy    . Colonoscopy with propofol N/A 08/13/2015    Procedure: COLONOSCOPY WITH PROPOFOL;  Surgeon: Milus Banister, MD;  Location: [facility] ENDOSCOPY;  Service: Endoscopy;  Laterality: N/A;  . Esophagogastroduodenoscopy (egd) with propofol N/A 08/13/2015    Procedure: ESOPHAGOGASTRODUODENOSCOPY (EGD) WITH PROPOFOL;  Surgeon: Milus Banister, MD;  Location: [facility] ENDOSCOPY;  Service: Endoscopy;  Laterality: N/A;    Assessment / Plan / Recommendation Clinical Impression [name] is a 60 y.o. right handed female with history of chronic back pain, tobacco and alcohol abuse, PTSD, GI bleed. Per chart review patient lives with nieces. Two-level home with bed and bath upstairs. Does not have 24/7 assist.  Presented 09/26/2015 with acute onset of left-sided weakness. Cranial CT scan negative. MRI showed a 4m acute ischemic nonhemorrhagic infarct involving the posterior right corona radiata. Multiple remote lacunar infarcts involving the bilateral external capsules/basal ganglia and the right paramedian pons. MRA of the head negative. Patient did not receive TPA. Echocardiogram with ejection fraction of 517%grade 2 diastolic dysfunction. Carotid Dopplers with no ICA stenosis. Neurology consulted maintained on aspirin for CVA prophylaxis. Subcutaneous Lovenox for DVT prophylaxis. Tolerating a regular consistency diet. Pt was admitted for comprehensive rehabilitation program on 10/01/2015.   Cognitive Linguistic evaluation was completed on 10/02/2015.  Results reveal deficits in the areas of speech intelligibility at the sentence level and simple conversation level d/t decreased volume, left facial weakness and imprecise articulation. Formal results of the MoCA Blind were 10/22 with score greater than 18 being  normal. Pt has deficits in the following areas: selective attention, semi-complex problem solving, anticipatory awareness and memory. Per patient, nursing and a bedside swallow screening pt is tolerating regular diet without s/s of dysphagia or aspiration. Pt doesn't describe any history of dysphagia. Patient would benefit from skilled SLP intervention in order to maximize her functional independence prior to discharge. Anticipate that pt will require 24 hour help within the home.    Skilled Therapeutic Interventions          Cognitive Linguistic and Speech evaluations completed with results and recommendations reviewed with patient. Skilled cognitive therapy initiated with Mod A verbal cues for utilization of memory strategies and functional problem solving of semi-complex problems.    SLP Assessment  Patient will need skilled Speech Lanaguage Pathology Services during CIR admission    Recommendations  SLP Diet Recommendations: Age appropriate regular solids;Thin Liquid Administration via: Cup;Straw Medication Administration: Whole meds with liquid Supervision: Patient able to self feed Postural Changes and/or Swallow Maneuvers: Seated upright 90 degrees Oral Care Recommendations: Oral care BID Recommendations for Other Services: Neuropsych consult Patient destination: Home Follow up Recommendations: None Equipment Recommended: None recommended by SLP    SLP Frequency 3 to 5 out of 7 days   SLP Duration  SLP Intensity  SLP Treatment/Interventions 18 to 21 days  Minumum of 1-2 x/day, 30 to 90 minutes  Cognitive remediation/compensation;Patient/family education;Speech/Language facilitation    Pain Pain Assessment Pain Assessment: No/denies pain Pain Score: 6  Pain Location: Other (Comment) (Side) Pain Orientation: Left Pain Descriptors / Indicators: Aching Pain Frequency: Intermittent Pain Onset: Gradual Pain Intervention(s): Medication (See eMAR)  Prior  Functioning Cognitive/Linguistic Baseline: Within functional limits Type of Home: House  Lives With: Family Available Help at Discharge: Family;Available 24 hours/day Education: 9th grade education Vocation: On disability  Function:   Cognition Comprehension Comprehension assist level: Follows basic conversation/direction with no assist  Expression   Expression assist level: Expresses basic 75 - 89% of the time/requires cueing 10 - 24% of the time. Needs helper to occlude trach/needs to repeat words.  Social Interaction Social Interaction assist level: Interacts appropriately 90% of the time - Needs monitoring or encouragement for participation or interaction.  Problem Solving Problem solving assist level: Solves basic 75 - 89% of the time/requires cueing 10 - 24% of the time  Memory Memory assist level: Recognizes or recalls 50 - 74% of the time/requires cueing 25 - 49% of the time   Short Term Goals: Week 1: SLP Short Term Goal 1 (Week 1): Pt will utilize speech intelligibility strategies at the sentence level with Min A verbal cues to achieve 100% intelligibility.  SLP Short Term Goal 2 (Week 1): Pt will demonstrate functional problem solving for mildly complex tasks with Min A verbal cues.  SLP Short Term Goal 3 (Week 1): Pt will utilize external memory aids to recall new, daily information with Min A verbal and quetion cues.  SLP Short Term Goal 4 (Week 1): Pt will demonstrate selective attention to functional tasks in a moderatey distracting environment for 30 with Min A verbal cues for redirection.  SLP Short Term Goal 5 (Week 1): Pt will demonstrate anticipatory awareness and identify 3 tasks she can participate in at home safely with Min A verbal cues.  Refer to Care Plan for Long Term Goals  Recommendations for other services: Neuropsych  Discharge Criteria: Patient will be discharged from SLP if patient refuses treatment 3 consecutive times without medical reason, if  treatment goals not met, if there is a change in medical status, if patient makes no progress towards goals or if patient is discharged from hospital.  The above assessment, treatment plan, treatment alternatives and goals were discussed and mutually agreed upon: by patient  [name] 10/02/2015, 12:45 PM

## 2015-10-02 NOTE — Evaluation (Signed)
Occupational Therapy Assessment and Plan  Patient Details  Name: [name] MRN: [id number] Date of Birth: 05/30/1955  OT Diagnosis: abnormal posture, blindness and low vision, cognitive deficits, disturbance of vision, hemiplegia affecting dominant side and muscle weakness (generalized) Rehab Potential: Rehab Potential (ACUTE ONLY): Good ELOS: 17-19 days   Today's Date: 10/02/2015 OT Individual Time: 0800-0900 OT Individual Time Calculation (min): 60 min     Problem List:  Patient Active Problem List   Diagnosis Date Noted  . Chronic back pain   . ETOH abuse   . Hypokalemia   . PTSD (post-traumatic stress disorder)   . Gastroesophageal reflux disease without esophagitis   . Acute ischemic stroke ([facility]) 09/27/2015  . Acute CVA (cerebrovascular accident) ([facility]) 09/27/2015  . Tobacco abuse   . Left-sided weakness 09/26/2015  . Acute gastric ulcer   . GI bleed 05/22/2014  . Acute GI bleeding 05/22/2014  . Acute blood loss anemia 05/22/2014  . History of CVA (cerebrovascular accident) 05/22/2014  . Hyperlipidemia 05/22/2014  . Gastrointestinal hemorrhage with melena   . CVA (cerebral infarction) 10/09/2012  . Flank pain 10/09/2012  . Right pontine stroke ([facility]) 10/09/2012  . ABDOMINAL PAIN OTHER SPECIFIED SITE 05/17/2010  . ALCOHOL ABUSE 06/16/2007  . COCAINE ABUSE 06/16/2007  . DEPRESSION 06/16/2007  . Essential hypertension 06/16/2007  . CEREBROVASCULAR ACCIDENT 06/16/2007  . ASTHMA 06/16/2007  . GERD 06/16/2007  . PANCREATITIS 11/09/2006  . ESOPHAGITIS, REFLUX 05/23/2006    Past Medical History:  Past Medical History  Diagnosis Date  . Chronic back pain   . MDD (major depressive disorder) ([facility])   . Alcohol abuse   . PTSD (post-traumatic stress disorder)   . UTI (lower urinary tract infection)   . Colitis   . Stroke ([facility])   . Pancreatitis   . Hepatitis     Hepatitis C  . GI bleed   . Gastric ulcer   . Fatty liver   . CVA (cerebral infarction)    Past  Surgical History:  Past Surgical History  Procedure Laterality Date  . Esophagogastroduodenoscopy endoscopy    . Esophagogastroduodenoscopy N/A 05/23/2014    Procedure: ESOPHAGOGASTRODUODENOSCOPY (EGD);  Surgeon: Jay M Pyrtle, MD;  Location: [facility] ENDOSCOPY;  Service: Endoscopy;  Laterality: N/A;  . Appendectomy    . Gall stone removed    . Abdominal hysterectomy    . Colonoscopy with propofol N/A 08/13/2015    Procedure: COLONOSCOPY WITH PROPOFOL;  Surgeon: Daniel P Jacobs, MD;  Location: [facility] ENDOSCOPY;  Service: Endoscopy;  Laterality: N/A;  . Esophagogastroduodenoscopy (egd) with propofol N/A 08/13/2015    Procedure: ESOPHAGOGASTRODUODENOSCOPY (EGD) WITH PROPOFOL;  Surgeon: Daniel P Jacobs, MD;  Location: [facility] ENDOSCOPY;  Service: Endoscopy;  Laterality: N/A;    Assessment & Plan Clinical Impression: Patient is a 60 y.o. year old female with recent admission to the hospital on 09/26/2015 with acute onset of left-sided weakness. Cranial CT scan negative. 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 Multiple remote lacunar infarcts involving the bilateral external capsules/basal ganglia and the right paramedian pons.  Patient transferred to CIR on 10/01/2015 .    Patient currently requires max with basic self-care skills secondary to muscle weakness, impaired timing and sequencing, unbalanced muscle activation and decreased coordination, decreased midline orientation and decreased attention to left, decreased attention and decreased problem solving and decreased sitting balance, decreased standing balance, decreased postural control, hemiplegia and decreased balance strategies.  Prior to hospitalization, patient could complete ADLs with independent .    Patient will benefit from skilled intervention to decrease level of assist with basic self-care skills prior to discharge home with care partner.  Anticipate patient will require 24 hour supervision and  minimal physical assistance and follow up home health.  OT - End of Session Activity Tolerance: Tolerates 30+ min activity with multiple rests Endurance Deficit: Yes OT Assessment Rehab Potential (ACUTE ONLY): Good OT Patient demonstrates impairments in the following area(s): Balance;Motor;Endurance;Perception;Sensory;Vision;Cognition OT Basic ADL's Functional Problem(s): Eating;Grooming;Bathing;Dressing;Toileting OT Advanced ADL's Functional Problem(s): Simple Meal Preparation OT Transfers Functional Problem(s): Toilet;Tub/Shower OT Additional Impairment(s): Fuctional Use of Upper Extremity OT Plan OT Intensity: Minimum of 1-2 x/day, 45 to 90 minutes OT Frequency: 5 out of 7 days OT Duration/Estimated Length of Stay: 17-19 days OT Treatment/Interventions: Balance/vestibular training;Cognitive remediation/compensation;Community reintegration;Discharge planning;Functional mobility training;Functional electrical stimulation;DME/adaptive equipment instruction;Disease mangement/prevention;Neuromuscular re-education;Pain management;Patient/family education;Psychosocial support;Therapeutic Activities;Self Care/advanced ADL retraining;Therapeutic Exercise;UE/LE Strength taining/ROM;UE/LE Coordination activities;Visual/perceptual remediation/compensation;Wheelchair propulsion/positioning OT Self Feeding Anticipated Outcome(s): modified independent OT Basic Self-Care Anticipated Outcome(s): supervision to min assist OT Toileting Anticipated Outcome(s): supervision to min assist OT Bathroom Transfers Anticipated Outcome(s): supervision to min assist OT Recommendation Patient destination: Home Follow Up Recommendations: Home health OT Equipment Recommended: To be determined   Skilled Therapeutic Intervention Pt began working on selfcare re-training during session.  Decreased activation of the LLE in standing requiring max facilitation to achieve and maintain full knee extension. Max assist for  ambulation to the shower with hand held assist.  Slight pusher strategies to the left noted in sitting and standing.  She uses the LUE with mod assist for washing the RUE as well as for attempting to hold items such as pants and brief when donning.  Pt left in wheelchair with PT at end of session.  Provided half lap tray for support in the LUE.      OT Evaluation Precautions/Restrictions  Precautions Precautions: Fall Precaution Comments: left hemiparesis Restrictions Weight Bearing Restrictions: No  Pain  No report of pain  Home Living/Prior Functioning Home Living Family/patient expects to be discharged to:: Private residence Living Arrangements: Other relatives Available Help at Discharge: Family, Available 24 hours/day Type of Home: House Home Access: Stairs to enter Technical brewer of Steps: 1 Home Layout: Two level, Bed/bath upstairs, 1/2 bath on main level Alternate Level Stairs-Number of Steps: 14 Alternate Level Stairs-Rails: Right Bathroom Shower/Tub: Chiropodist: Standard Additional Comments: pt used a QC in the house when needed, often did not use AD, has motorized w/c for outdoors  Lives With: Family IADL History Homemaking Responsibilities: Yes Meal Prep Responsibility: Secondary Laundry Responsibility: Secondary Current License: No Education: 9th grade education Occupation: On disability Prior Function Level of Independence: Requires assistive device for independence, Independent with basic ADLs  Able to Take Stairs?: Yes Vocation: On disability ADL  See Function Section of Chart  Vision/Perception  Vision- History Baseline Vision/History: Legally blind Patient Visual Report: No change from baseline Vision- Assessment Vision Assessment?: Yes Eye Alignment: Within Functional Limits Ocular Range of Motion: Within Functional Limits Tracking/Visual Pursuits: Decreased smoothness of vertical tracking;Decreased smoothness of eye  movement to RIGHT inferior field Visual Fields: Other (comment) (Pt with delayed response to stimuli on the right and left.)  Cognition Overall Cognitive Status: Impaired/Different from baseline Arousal/Alertness: Awake/alert Orientation Level: Person;Place;Situation Person: Oriented Place: Oriented Situation: Oriented Year: 2017 Month: July Day of Week: Correct Memory: Impaired Memory Impairment: Decreased recall of new information;Storage deficit Decreased Short Term Memory: Functional basic;Verbal basic Immediate Memory Recall: Sock;Blue;Bed Memory Recall: Sock;Bed Memory Recall Sock: Without  Cue Memory Recall Bed: Without Cue Attention: Sustained;Selective Sustained Attention: Appears intact Selective Attention: Impaired Selective Attention Impairment: Verbal basic;Functional basic Awareness: Appears intact Problem Solving: Impaired Problem Solving Impairment: Functional basic Safety/Judgment: Appears intact Comments: Pt demonstrates understanding of deficits related to CVA and reports correctly the procedure of getting assistance if needed.   Sensation Sensation Light Touch: Impaired Detail Light Touch Impaired Details: Impaired LUE Stereognosis: Impaired Detail Stereognosis Impaired Details: Impaired LUE Proprioception: Impaired Detail Proprioception Impaired Details: Impaired LUE Coordination Gross Motor Movements are Fluid and Coordinated: No Coordination and Movement Description: Pt uses the LUE with mod assist for washing the RUE and applying deodorant.  She demonstrates Brunnstrum stage IV-V movement in the arm and hand.   Motor  Motor Motor: Hemiplegia;Abnormal postural alignment and control Mobility  Bed Mobility Bed Mobility: Supine to Sit;Sit to Supine Supine to Sit: 3: Mod assist;HOB flat Sit to Supine: 3: Mod assist;HOB flat Transfers Transfers: Sit to Stand;Stand to Sit Sit to Stand: 2: Max assist;With upper extremity assist;From bed Stand to Sit:  2: Max assist;To bed;With upper extremity assist  Trunk/Postural Assessment  Cervical Assessment Cervical Assessment: Exceptions to WFL (slight cervical rotation to the right) Thoracic Assessment Thoracic Assessment: Within Functional Limits Lumbar Assessment Lumbar Assessment: Exceptions to WFL (posterior pelvic tilt present with increased left trunk elongation and right trunk shortening) Postural Control Postural Control: Deficits on evaluation Righting Reactions: increased lean to the left   Balance Balance Balance Assessed: Yes Static Sitting Balance Static Sitting - Balance Support: Feet supported Static Sitting - Level of Assistance: 5: Stand by assistance Dynamic Sitting Balance Dynamic Sitting - Balance Support: No upper extremity supported Dynamic Sitting - Level of Assistance: 3: Mod assist Static Standing Balance Static Standing - Balance Support: Right upper extremity supported Static Standing - Level of Assistance: 2: Max assist Dynamic Standing Balance Dynamic Standing - Balance Support: No upper extremity supported Dynamic Standing - Level of Assistance: 2: Max assist Extremity/Trunk Assessment RUE Assessment RUE Assessment: Within Functional Limits LUE Assessment LUE Assessment: Exceptions to WFL LUE Strength LUE Overall Strength Comments: Pt demonstrates Brunnstrum stage IV movement in the left arm and stage V in the hand.  Full gross digit flexion and extension present but pt is only able to oppose the thumb to the 1st digit.  She demonstrates shoulder flexion 0-100 degree in slight synergy pattern with right elbow flexed.     See Function Navigator for Current Functional Status.   Refer to Care Plan for Long Term Goals  Recommendations for other services: None  Discharge Criteria: Patient will be discharged from OT if patient refuses treatment 3 consecutive times without medical reason, if treatment goals not met, if there is a change in medical status, if  patient makes no progress towards goals or if patient is discharged from hospital.  The above assessment, treatment plan, treatment alternatives and goals were discussed and mutually agreed upon: by patient  MCGUIRE,JAMES OTR/L 10/02/2015, 4:43 PM  

## 2015-10-02 NOTE — Evaluation (Signed)
Physical Therapy Assessment and Plan  Patient Details  Name: [name] MRN: [id number] Date of Birth: [birth year]  PT Diagnosis: Abnormality of gait, Difficulty walking, Hemiplegia non-dominant, Impaired sensation and Muscle weakness Rehab Potential: Good ELOS: 18-21 days   Today's Date: 10/02/2015 PT Individual Time: 0900-0955 PT Individual Time Calculation (min): 55 min    Problem List:  Patient Active Problem List   Diagnosis Date Noted  . Chronic back pain   . ETOH abuse   . Hypokalemia   . PTSD (post-traumatic stress disorder)   . Gastroesophageal reflux disease without esophagitis   . Acute ischemic stroke ([place]) 09/27/2015  . Acute CVA (cerebrovascular accident) ([place]) 09/27/2015  . Tobacco abuse   . Left-sided weakness 09/26/2015  . Acute gastric ulcer   . GI bleed 05/22/2014  . Acute GI bleeding 05/22/2014  . Acute blood loss anemia 05/22/2014  . History of CVA (cerebrovascular accident) 05/22/2014  . Hyperlipidemia 05/22/2014  . Gastrointestinal hemorrhage with melena   . CVA (cerebral infarction) 10/09/2012  . Flank pain 10/09/2012  . Right pontine stroke ([place]) 10/09/2012  . ABDOMINAL PAIN OTHER SPECIFIED SITE 05/17/2010  . ALCOHOL ABUSE 06/16/2007  . COCAINE ABUSE 06/16/2007  . DEPRESSION 06/16/2007  . Essential hypertension 06/16/2007  . CEREBROVASCULAR ACCIDENT 06/16/2007  . ASTHMA 06/16/2007  . GERD 06/16/2007  . PANCREATITIS 11/09/2006  . ESOPHAGITIS, REFLUX 05/23/2006    Past Medical History:  Past Medical History  Diagnosis Date  . Chronic back pain   . MDD (major depressive disorder) ([place])   . Alcohol abuse   . PTSD (post-traumatic stress disorder)   . UTI (lower urinary tract infection)   . Colitis   . Stroke ([place])   . Pancreatitis   . Hepatitis     Hepatitis C  . GI bleed   . Gastric ulcer   . Fatty liver   . CVA (cerebral infarction)    Past Surgical History:  Past Surgical History  Procedure Laterality Date  .  Esophagogastroduodenoscopy endoscopy    . Esophagogastroduodenoscopy N/A 05/23/2014    Procedure: ESOPHAGOGASTRODUODENOSCOPY (EGD);  Surgeon: Jerene Bears, MD;  Location: [facility] ENDOSCOPY;  Service: Endoscopy;  Laterality: N/A;  . Appendectomy    . Gall stone removed    . Abdominal hysterectomy    . Colonoscopy with propofol N/A 08/13/2015    Procedure: COLONOSCOPY WITH PROPOFOL;  Surgeon: Milus Banister, MD;  Location: [facility] ENDOSCOPY;  Service: Endoscopy;  Laterality: N/A;  . Esophagogastroduodenoscopy (egd) with propofol N/A 08/13/2015    Procedure: ESOPHAGOGASTRODUODENOSCOPY (EGD) WITH PROPOFOL;  Surgeon: Milus Banister, MD;  Location: [facility] ENDOSCOPY;  Service: Endoscopy;  Laterality: N/A;    Assessment & Plan Clinical Impression: Patient is a 60 y.o. year old female with recent admission to the hospital on 09/26/2015 with acute onset of left-sided weakness. Cranial CT scan negative. MRI showed a 56m acute ischemic nonhemorrhagic infarct involving the posterior right corona radiata. Multiple remote lacunar infarcts involving the bilateral external capsules/basal ganglia and the right paramedian pons..  Patient transferred to CIR on 10/01/2015 .   Patient currently requires max with mobility secondary to muscle weakness, abnormal tone and decreased coordination and decreased standing balance, decreased postural control, hemiplegia and decreased balance strategies.  Prior to hospitalization, patient was modified independent  with mobility and lived with Family in a House home.  Home access is 1Stairs to enter.  Patient will benefit from skilled PT intervention to maximize safe functional mobility, minimize fall risk and decrease caregiver burden for planned  discharge home with 24 hour supervision.  Anticipate patient will benefit from follow up [facility] at discharge.  PT - End of Session Activity Tolerance: Tolerates 30+ min activity with multiple rests Endurance Deficit: Yes PT Assessment Rehab Potential  (ACUTE/IP ONLY): Good Barriers to Discharge: Inaccessible home environment PT Patient demonstrates impairments in the following area(s): Balance;Endurance;Motor;Sensory PT Transfers Functional Problem(s): Bed Mobility;Bed to Chair;Car;Furniture PT Locomotion Functional Problem(s): Stairs;Ambulation;Wheelchair Mobility PT Plan PT Intensity: Minimum of 1-2 x/day ,45 to 90 minutes PT Frequency: 5 out of 7 days PT Duration Estimated Length of Stay: 18-21 days PT Treatment/Interventions: Ambulation/gait training;Discharge planning;Functional mobility training;Therapeutic Activities;Therapeutic Exercise;Wheelchair propulsion/positioning;Neuromuscular re-education;Balance/vestibular training;Cognitive remediation/compensation;Community reintegration;Functional electrical stimulation;Patient/family education;Stair training;UE/LE Coordination activities;UE/LE Strength taining/ROM;Splinting/orthotics;Pain management;DME/adaptive equipment instruction PT Transfers Anticipated Outcome(s): supervision PT Locomotion Anticipated Outcome(s): min A gait, supervision w/c PT Recommendation Follow Up Recommendations: Home health PT;24 hour supervision/assistance Patient destination: Home Equipment Recommended: To be determined  Skilled Therapeutic Intervention Pt performed standing NMR with focus on forced use of Lt LE with reaching and tapping tasks with max A for balance, manual facilitation for hip and knee control on Lt. Pt with Rt knee hyperextension in standing,able to perform mini squats with cues for Rt knee flexion.  Simulated car transfer to regular car with max A. W/c mobility with hemi technique min A initially, progressed to supervision with cues for using LE to steer.  Toilet transfer and standing for toileting with max A for transfer, mod A for standing balance for clothing management.  PT Evaluation Precautions/Restrictions Precautions Precautions: Fall Restrictions Weight Bearing Restrictions:  No Pain Pain Assessment Pain Assessment: No/denies pain Home Living/Prior Functioning Home Living Available Help at Discharge: Family;Available 24 hours/day Type of Home: House Home Access: Stairs to enter [employer] of Steps: 1 Home Layout: Two level;Bed/bath upstairs Alternate Level Stairs-Number of Steps: 14 Additional Comments: pt used a QC in the house when needed, often did not use AD, has motorized w/c for outdoors  Lives With: Family Prior Function Level of Independence: Requires assistive device for independence  Able to Take Stairs?: Yes  Cognition Overall Cognitive Status: Within Functional Limits for tasks assessed Sensation Sensation Light Touch: Impaired Detail Light Touch Impaired Details: Impaired LUE Proprioception: Impaired Detail Proprioception Impaired Details: Impaired LLE;Impaired LUE Coordination Gross Motor Movements are Fluid and Coordinated: No Coordination and Movement Description: Lt hemiplegia Motor  Motor Motor: Abnormal tone;Abnormal postural alignment and control;Hemiplegia Motor - Skilled Clinical Observations: decreased tone Lt UE and LE   Trunk/Postural Assessment  Cervical Assessment Cervical Assessment: Within Functional Limits Thoracic Assessment Thoracic Assessment: Within Functional Limits Lumbar Assessment Lumbar Assessment:  (posterior pelvic tilt) Postural Control Postural Control: Deficits on evaluation Righting Reactions: delayed  Balance Balance Balance Assessed: Yes Static Standing Balance Static Standing - Comment/# of Minutes: with Rt UE support with max A for Lt LE control Dynamic Standing Balance Dynamic Standing - Comments: total A Extremity Assessment      RLE Assessment RLE Assessment: Within Functional Limits LLE Assessment LLE Assessment:  (quad 2/5, ankle 0/5, hamstring 2-/5)   See Function Navigator for Current Functional Status.   Refer to Care Plan for Long Term  Goals  Recommendations for other services: None  Discharge Criteria: Patient will be discharged from PT if patient refuses treatment 3 consecutive times without medical reason, if treatment goals not met, if there is a change in medical status, if patient makes no progress towards goals or if patient is discharged from hospital.  The above assessment, treatment plan, treatment alternatives and  goals were discussed and mutually agreed upon: by patient  Liliah Dorian 10/02/2015, 9:57 AM
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
## 2015-10-02 NOTE — Interval H&P Note (Signed)
[name] was admitted today to Inpatient Rehabilitation with thediagnosis of Right Corana radiata infarct.  The patient's history has been reviewed, patient examined, and there is no change in status.  Patient continues to be appropriate for intensive inpatient rehabilitation.  I have reviewed the patient's chart and labs.  Questions were answered to the patient's satisfaction. The PAPE has been reviewed and assessment remains appropriate.  Alexa ColaceKIRSTEINS,[name], 6:51 AM

## 2015-10-02 NOTE — H&P (View-Only) (Signed)
Physical Medicine and Rehabilitation Admission H&P    Chief Complaint  Patient presents with  . Stroke Symptoms  : HPI: [name] is a 60 y.o. right handed female with history of chronic back pain, tobacco and alcohol abuse, PTSD, GI bleed. Per chart review patient lives with nieces. Two-level home with bed and bath upstairs.Reported to use a single-point cane prior to admission.  Presented 09/26/2015 with acute onset of left-sided weakness. Cranial CT scan negative. 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 Multiple remote lacunar infarcts involving the bilateral external capsules/basal ganglia and the right paramedian pons. MRA of the head negative. Patient did not receive TPA. Echocardiogram with ejection fraction of 55% grade 2 diastolic dysfunction. Carotid Dopplers with no ICA stenosis. Neurology consulted maintained on Plavix for CVA prophylaxis. Subcutaneous Lovenox for DVT prophylaxis. 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 She had affecting her left eye. In the 1990s. She also had some type of nerve problem in the right eye. She states she last drank alcohol in November 2016 and at that time, she was only drinking about one beer a day.  ROS Constitutional: Negative for fever and chills.  HENT: Negative for hearing loss.  Eyes: Negative for blurred vision and double vision.  Respiratory: Positive for cough.   Occasional shortness of breath with exertion  Cardiovascular: Negative for chest pain, palpitations and leg swelling.  Gastrointestinal: Positive for constipation. Negative for nausea and vomiting.  Genitourinary: Negative for dysuria and hematuria.  Musculoskeletal: Positive for back pain.  Skin: Negative for rash.  Neurological: Positive for weakness and headaches. Negative for  seizures and loss of consciousness.  Psychiatric/Behavioral: Positive for depression.  All other systems reviewed and are negative   Past Medical History  Diagnosis Date  . Chronic back pain   . MDD (major depressive disorder) (HCC)   . Alcohol abuse   . PTSD (post-traumatic stress disorder)   . UTI (lower urinary tract infection)   . Colitis   . Stroke (HCC)   . Pancreatitis   . Hepatitis     Hepatitis C  . GI bleed   . Gastric ulcer   . Fatty liver   . CVA (cerebral infarction)    Past Surgical History  Procedure Laterality Date  . Esophagogastroduodenoscopy endoscopy    . Esophagogastroduodenoscopy N/A 05/23/2014    Procedure: ESOPHAGOGASTRODUODENOSCOPY (EGD);  Surgeon: Beverley Fiedler, MD;  Location: [facility] ENDOSCOPY;  Service: Endoscopy;  Laterality: N/A;  . Appendectomy    . Gall stone removed    . Abdominal hysterectomy    . Colonoscopy with propofol N/A 08/13/2015    Procedure: COLONOSCOPY WITH PROPOFOL;  Surgeon: Rachael Fee, MD;  Location: [facility] ENDOSCOPY;  Service: Endoscopy;  Laterality: N/A;  . Esophagogastroduodenoscopy (egd) with propofol N/A 08/13/2015    Procedure: ESOPHAGOGASTRODUODENOSCOPY (EGD) WITH PROPOFOL;  Surgeon: Rachael Fee, MD;  Location: [facility] ENDOSCOPY;  Service: Endoscopy;  Laterality: N/A;   Family History  Problem Relation Age of Onset  . Stroke Mother   . Cancer Sister     type unknown  . Diabetes Father    Social History:  reports that she has been smoking Cigarettes.  She has been smoking about 0.50 packs per day. She quit smokeless tobacco use about 37 years ago. Her smokeless tobacco use included Chew. She reports that she does not drink alcohol  or use illicit drugs. Allergies:  Allergies  Allergen Reactions  . Banana Other (See Comments)    Welts on skin and sores in mouth  . Ibuprofen Other (See Comments)    blisters  . Penicillins Other (See Comments)    Blister Has patient had a PCN reaction causing immediate rash,  facial/tongue/throat swelling, SOB or lightheadedness with hypotension: no Has patient had a PCN reaction causing severe rash involving mucus membranes or skin necrosis: yes - blisters Has patient had a PCN reaction that required hospitalization no Has patient had a PCN reaction occurring within the last 10 years: no If all of the above answers are "NO", then may proceed with Cephalosporin use.    Medications Prior to Admission  Medication Sig Dispense Refill  . albuterol (PROVENTIL HFA;VENTOLIN HFA) 108 (90 BASE) MCG/ACT inhaler Inhale 2 puffs into the lungs every 8 (eight) hours as needed for wheezing or shortness of breath.    . baclofen (LIORESAL) 10 MG tablet Take 10 mg by mouth 2 (two) times daily.    . cyclobenzaprine (FLEXERIL) 5 MG tablet Take 1 tablet (5 mg total) by mouth 2 (two) times daily. 30 tablet 0  . cycloSPORINE (RESTASIS) 0.05 % ophthalmic emulsion Place 1 drop into both eyes 2 (two) times daily.    Marland Kitchen doxycycline (VIBRAMYCIN) 100 MG capsule Take 100 mg by mouth 2 (two) times daily.    . fluticasone (FLONASE) 50 MCG/ACT nasal spray Place 1 spray into the nose daily as needed for allergies.   2  . gabapentin (NEURONTIN) 300 MG capsule Take 300 mg by mouth 3 (three) times daily.   1  . loratadine (CLARITIN) 10 MG tablet Take 10 mg by mouth daily.  2  . oxyCODONE-acetaminophen (PERCOCET) 7.5-325 MG per tablet Take 1 tablet by mouth every 4 (four) hours as needed for pain.   0  . pantoprazole (PROTONIX) 40 MG tablet Take 1 tablet (40 mg total) by mouth 2 (two) times daily before a meal. 60 tablet 0  . rOPINIRole (REQUIP) 0.25 MG tablet Take 0.25 mg by mouth 3 (three) times daily.    . sertraline (ZOLOFT) 50 MG tablet Take 50 mg by mouth daily.    Marland Kitchen SIMBRINZA 1-0.2 % SUSP Place 1 drop into both eyes 3 (three) times daily.   4  . temazepam (RESTORIL) 15 MG capsule Take 15 mg by mouth at bedtime.    . Travoprost, BAK Free, (TRAVATAN) 0.004 % SOLN ophthalmic solution Place 1 drop  into both eyes at bedtime.       Home: Home Living Family/patient expects to be discharged to:: Private residence Living Arrangements: Other relatives (neices) Available Help at Discharge: Family, Available 24 hours/day Type of Home: House Home Access: Stairs to enter Secretary/administrator of Steps: 1 Home Layout: Two level, Bed/bath upstairs Alternate Level Stairs-Number of Steps: 14 Alternate Level Stairs-Rails: Right Bathroom Shower/Tub: Engineer, manufacturing systems: Standard Home Equipment: Cane - single point, Software engineer History: Prior Function Level of Independence: Needs assistance Gait / Transfers Assistance Needed: reports was independent with ambulation and toileting ADL's / Homemaking Assistance Needed: assist with bathing and dressing per PT eval. Pt reports independent with BADLs during OT eval.  Functional Status:  Mobility: Bed Mobility Overal bed mobility: Needs Assistance Bed Mobility: Supine to Sit Supine to sit: HOB elevated, Min assist, Mod assist General bed mobility comments: Pt OOB in chair upon arrival. Transfers Overall transfer level: Needs assistance Equipment used: Rolling walker (2 wheeled)  Transfers: Sit to/from Stand Sit to Stand: Mod assist General transfer comment: increased time even after set up cues for L LE placement and hand placement, slow to rise with weakness on L Ambulation/Gait Ambulation/Gait assistance: Max assist, Mod assist Ambulation Distance (Feet): 15 Feet (x 2) Assistive device: Rolling walker (2 wheeled) Gait Pattern/deviations: Step-to pattern, Decreased stride length, Wide base of support, Shuffle, Ataxic, Decreased dorsiflexion - left General Gait Details: placing R LE almost outside of walker, cues for more central placement, assist for weight shift to R and cues/facilitation for L LE extension in stance; walked from chair around to sit on opposite side of bed, then back to chair     ADL: ADL Overall ADL's : Needs assistance/impaired Eating/Feeding: Moderate assistance, With adaptive utensils, Sitting Eating/Feeding Details (indicate cue type and reason): Simulated by oral care activity. Educated pt on use of red foam with utensils to increase independence with self feeding. Grooming: Moderate assistance, With adaptive equipment, Sitting, Oral care, Wash/dry face Grooming Details (indicate cue type and reason): Pt able to brush teeth with use of red foam to build up handle; used R hand for brushing but attempting to use L hand to assist. Upper Body Bathing: Moderate assistance, Sitting Lower Body Bathing: Maximal assistance, Sit to/from stand Upper Body Dressing : Moderate assistance, Sitting Lower Body Dressing: Maximal assistance, Sit to/from stand General ADL Comments: Did not assess transfers at this time; pt just finished ambulating with PT and politely declined due to fatigue. Educated pt on proper positioning of LUE, incorporating LUE into functional activities, use of red foam for increased independence with self feeding and grooming tasks.  Cognition: Cognition Overall Cognitive Status: No family/caregiver present to determine baseline cognitive functioning Orientation Level: Oriented X4 Cognition Arousal/Alertness: Awake/alert Behavior During Therapy: WFL for tasks assessed/performed Overall Cognitive Status: No family/caregiver present to determine baseline cognitive functioning Area of Impairment: Following commands, Problem solving Following Commands: Follows multi-step commands with increased time Problem Solving: Slow processing  Physical Exam: Blood pressure 146/70, pulse 57, temperature 97.9 F (36.6 C), temperature source Oral, resp. rate 18, height 5\' 4"  (1.626 m), weight 61.961 kg (136 lb 9.6 oz), SpO2 98 %. Physical Exam Constitutional: She is oriented to person, place, and time. She appears well-developed and well-nourished.  HENT:  Head:  Normocephalic and atraumatic.  Eyes: Conjunctivae and EOM are normal.  Neck: Normal range of motion. Neck supple. No thyromegaly present.  Cardiovascular: Normal rate and regular rhythm.  Respiratory: Effort normal and breath sounds normal. No respiratory distress.  GI: Soft. Bowel sounds are normal. She exhibits no distension.  Musculoskeletal: She exhibits no edema or tenderness.  Neurological: She is alert and oriented to person, place, and time.  Speech is a bit hoarse but intelligible.  Follows simple commands.  Fair awareness of deficits with complaints of back pain. Sensation diminished to light touch LLE 3+ DTRs LLE Left facial weakness Motor: RUE: 4+/5 proximal to distal LUE: 3/5 proximal to distal LLE: 2-/5 hip flexion, knee extension, 1/5 ankle dorsi/plantar flexion  Skin: Skin is warm and dry.  Psychiatric: Her affect is blunt. She is slowed   No results found for this or any previous visit (from the past 48 hour(s)). No results found.     Medical Problem List and Plan: 1.  Left side weakness secondary to nonhemorrhagic infarct posterior right corona radiata 2.  DVT Prophylaxis/Anticoagulation: Subcutaneous Lovenox. Monitor platelet counts in signs of bleeding 3. Pain Management/chronic back pain. Flexeril 5 mg twice a day, baclofen  10 mg twice a day, Neurontin 300 mg 3 times a day and Percocet as needed. 4. Mood/PTSD. Zoloft 50 mg daily, Requip 0.25 mg 3 times a day 5. Neuropsych: This patient is capable of making decisions on her own behalf. 6. Skin/Wound Care: Routine skin checks 7. Fluids/Electrolytes/Nutrition: Routine I&O's follow-up chemistries 8. Tobacco alcohol abuse. Counseling. Consider Nicoderm patch 9. History of GI bleed. Continue Pepcid twice a day. Follow-up CBC    Post Admission Physician Evaluation: 1. Functional deficits secondary  to left hemiparesis  2. Patient is admitted to receive collaborative, interdisciplinary care between the  physiatrist, rehab nursing staff, and therapy team. 3. Patient's level of medical complexity and substantial therapy needs in context of that medical necessity cannot be provided at a lesser intensity of care such as a SNF. 4. Patient has experienced substantial functional loss from his/her baseline which was documented above under the "Functional History" and "Functional Status" headings.  Judging by the patient's diagnosis, physical exam, and functional history, the patient has potential for functional progress which will result in measurable gains while on inpatient rehab.  These gains will be of substantial and practical use upon discharge  in facilitating mobility and self-care at the household level. 5. Physiatrist will provide 24 hour management of medical needs as well as oversight of the therapy plan/treatment and provide guidance as appropriate regarding the interaction of the two. 6. 24 hour rehab nursing will assist with bladder management, bowel management, safety, skin/wound care, disease management, medication administration, pain management and patient education  and help integrate therapy concepts, techniques,education, etc. 7. PT will assess and treat for/with: pre gait, gait training, endurance , safety, equipment, neuromuscular re education.   Goals are: Supervision/minA. 8. OT will assess and treat for/with: ADLs, Cognitive perceptual skills, Neuromuscular re education, safety, endurance, equipment.   Goals are: sup/minA. Therapy may proceed with showering this patient. 9. SLP will assess and treat for/with: Assess cognition, swallowing, medication management, vision.  Goals are: Supervision to modified independent medication management. 10. Case Management and Social Worker will assess and treat for psychological issues and discharge planning. 11. Team conference will be held weekly to assess progress toward goals and to determine barriers to discharge. 12. Patient will receive at  least 3 hours of therapy per day at least 5 days per week. 13. ELOS: 14-18 days       14. Prognosis:  good     Erick Colace M.D. [facility] FAAPM&R (Sports Med, Neuromuscular Med) Diplomate Am Board of Electrodiagnostic Med   09/29/2015

## 2015-10-02 NOTE — Progress Notes (Signed)
Occupational Therapy Session Note  Patient Details  Name: [name] MRN: [id number] Date of Birth: 03-28-1955  Today's Date: 10/02/2015 OT Individual Time: [id number] OT Individual Time Calculation (min): 33 min    Short Term Goals: Week 1:  OT Short Term Goal 1 (Week 1): Pt will complete LB bathing sit to stand with mod assist. OT Short Term Goal 2 (Week 1): Pt will complete LB dressing sit to stand with mod assist.  OT Short Term Goal 3 (Week 1): Pt will complete toilet transfers with min assist using LRAD. OT Short Term Goal 4 (Week 1): Pt will perform shower transfers with min assist to walk-in shower. OT Short Term Goal 5 (Week 1): Pt will use the LUE as a gross assist for bathing and dressing tasks with min assist.   Skilled Therapeutic Interventions/Progress Updates:   Pt worked on transitions from supine to sit and sit to supine following normal movement patterns with initiation of abdominal activity and LUE activation. Pt needing mod demonstrational cueing and mod assist to complete rolling to the right side with min assist for transition from right sidelying to sitting.  Worked on dynamic sitting balance from EOM while changing pants.  Mod instructional cueing on hemi dressing techniques for removing pants and donning new shorts.  Increased LOB to the left when attempting to cross the LLE over the right knee.  Pt needing max assist for sit to stand and standing to pull short over her hips.  Increased pushing to the left side noted with standing.  Pt returned to supine with mod assist.  Therapist educated pt on AROM exercises for the left shoulder and hand as well as FM task that can be worked on as well using deodorant.  Pt left with call button and phone in reach and bed alarm in place.    Therapy Documentation Precautions:  Precautions Precautions: Fall Precaution Comments: left hemiparesis Restrictions Weight Bearing Restrictions: No  Pain:   No report of pain  ADL: See  Function Navigator for Current Functional Status.   Therapy/Group: Individual Therapy  Syretta Kochel OTR/L 10/02/2015, 4:55 PM

## 2015-10-02 NOTE — Progress Notes (Signed)
Patient information reviewed and entered into [facility] system by Vianey Caniglia, RN, CRRN, PPS Coordinator.  Information including medical coding and functional independence measure will be reviewed and updated through discharge.    

## 2015-10-03 ENCOUNTER — Inpatient Hospital Stay (HOSPITAL_COMMUNITY): Payer: Medicaid Other | Admitting: *Deleted

## 2015-10-03 ENCOUNTER — Inpatient Hospital Stay (HOSPITAL_COMMUNITY): Payer: Medicaid Other | Admitting: Speech Pathology

## 2015-10-03 ENCOUNTER — Inpatient Hospital Stay (HOSPITAL_COMMUNITY): Payer: Medicaid Other | Admitting: Occupational Therapy

## 2015-10-03 DIAGNOSIS — G8194 Hemiplegia, unspecified affecting left nondominant side: Secondary | ICD-10-CM

## 2015-10-03 LAB — URINALYSIS, ROUTINE W REFLEX MICROSCOPIC
BILIRUBIN URINE: NEGATIVE
GLUCOSE, UA: NEGATIVE mg/dL
HGB URINE DIPSTICK: NEGATIVE
Ketones, ur: NEGATIVE mg/dL
Leukocytes, UA: NEGATIVE
Nitrite: NEGATIVE
PH: 6.5 (ref 5.0–8.0)
Protein, ur: NEGATIVE mg/dL
SPECIFIC GRAVITY, URINE: 1.006 (ref 1.005–1.030)

## 2015-10-03 NOTE — Progress Notes (Signed)
Physical Therapy Session Note  Patient Details  Name: [name] MRN: [id number] Date of Birth: 1955/04/27  Today's Date: 10/03/2015    Skilled Therapeutic Interventions/Progress Updates:  Session I [phone] (60 min)   Patient in bed, agrees to session, no c/o pain or discomfort. Assisted in donning pants in supine with max A, upper body dressing sitting on side of bde. Supine to sit with min A and cues for engaging L side.  Transfer to w/c with mod A via squat pivot, manual assistance for L knee locking and weight shift to complete transfer.  W/c propulsion to gym with B UE with mod A and hand over hand assistance to assure use of L side.  Transfer to mat via squat pivot mod A.  In supine: bridging 2 x10, PNF patterns for L LE NMR.  Coming to quadruped with max A in order to assure and facilitate WB through L side of the body. Weight shifting A-P and medial lateral direction. Coming to high kneeling with max A-in kneeling weight shifting.  Long sitting with training in scooting forward -maxA Training in gait with side rail on hall with max A and manual cues for L knee locking during stance and weight shift. 2 x 25 feet with max A. During training improved quad activation with verbal and manual cues. Stepping back 1 x 15 feet in order to increase L side activation.  At the end of session patient returned to room, left sitting inw/c with all needs within reach.   Session II 1130-1200 (30 min )  Session has been focused in on NMR to facilitate WB on L side and appropriate knee extension. Sit to stand and 1/2 sit to stand with weight shifting R and L with mod to max A in order to maintain balance, manual assistance for L knee control. ' In sitting training for lateral lumbar flexors in order to improve postural control. ' At the end of session patient requested to go to the bathroom-max A needed for transfers and donning on and off clothing. Therapy Documentation Precautions:   Precautions Precautions: Fall Precaution Comments: left hemiparesis Restrictions Weight Bearing Restrictions: No   See Function Navigator for Current Functional Status.   Therapy/Group: Individual Therapy  Dorna Mai 10/03/2015, 8:51 AM

## 2015-10-03 NOTE — Progress Notes (Signed)
60 y.o. right handed female with history of chronic back pain, tobacco and alcohol abuse, PTSD, GI bleed. Per chart review patient lives with nieces. Two-level home with bed and bath upstairs.Reported to use a single-point cane prior to admission.  Presented 09/26/2015 with acute onset of left-sided weakness. Cranial CT scan negative. 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 Multiple remote lacunar infarcts involving the bilateral external capsules/basal ganglia and the right paramedian pons Subjective/Complaints: No hip pain, c/o urinary freq but no burning ROS- no N/V/D, No CP, SOB  Objective: Vital Signs: Blood pressure 104/58, pulse 72, temperature 98.5 F (36.9 C), temperature source Oral, resp. rate 17, SpO2 96 %. Dg Hip Unilat With Pelvis 2-3 Views Left  10/02/2015  CLINICAL DATA:  Generalized left hip pain for 1 year. Recent stroke. EXAM: DG HIP (WITH OR WITHOUT PELVIS) 2-3V LEFT COMPARISON:  None. FINDINGS: Single view of the pelvis and two views of the left hip are provided. Osseous alignment is normal. Bone mineralization is normal. No fracture line or displaced fracture fragment identified. No degenerative change. Soft tissues about the pelvis and left hip are unremarkable. IMPRESSION: Negative. Electronically Signed   By: SFranki CabotM.D.   On: 10/02/2015 16:47   Results for orders placed or performed during the hospital encounter of 10/01/15 (from the past 72 hour(s))  CBC     Status: None   Collection Time: 10/01/15  5:29 AM  Result Value Ref Range   WBC 5.5 4.0 - 10.5 K/uL   RBC 4.41 3.87 - 5.11 MIL/uL   Hemoglobin 13.4 12.0 - 15.0 g/dL   HCT 40.8 36.0 - 46.0 %   MCV 92.5 78.0 - 100.0 fL   MCH 30.4 26.0 - 34.0 pg   MCHC 32.8 30.0 - 36.0 g/dL   RDW 13.1 11.5 - 15.5 %   Platelets 153 150 - 400 K/uL  Glucose, capillary     Status: None   Collection Time: 10/01/15  9:01 PM  Result Value Ref Range    Glucose-Capillary 99 65 - 99 mg/dL  CBC WITH DIFFERENTIAL     Status: Abnormal   Collection Time: 10/02/15  5:36 AM  Result Value Ref Range   WBC 4.6 4.0 - 10.5 K/uL   RBC 4.78 3.87 - 5.11 MIL/uL   Hemoglobin 15.0 12.0 - 15.0 g/dL   HCT 44.5 36.0 - 46.0 %   MCV 93.1 78.0 - 100.0 fL   MCH 31.4 26.0 - 34.0 pg   MCHC 33.7 30.0 - 36.0 g/dL   RDW 13.1 11.5 - 15.5 %   Platelets 144 (L) 150 - 400 K/uL   Neutrophils Relative % 38 %   Neutro Abs 1.8 1.7 - 7.7 K/uL   Lymphocytes Relative 48 %   Lymphs Abs 2.2 0.7 - 4.0 K/uL   Monocytes Relative 12 %   Monocytes Absolute 0.6 0.1 - 1.0 K/uL   Eosinophils Relative 2 %   Eosinophils Absolute 0.1 0.0 - 0.7 K/uL   Basophils Relative 0 %   Basophils Absolute 0.0 0.0 - 0.1 K/uL  Comprehensive metabolic panel     Status: None   Collection Time: 10/02/15  5:36 AM  Result Value Ref Range   Sodium 142 135 - 145 mmol/L   Potassium 4.3 3.5 - 5.1 mmol/L   Chloride 109 101 - 111 mmol/L   CO2 27 22 - 32 mmol/L   Glucose, Bld 81 65 - 99 mg/dL   BUN 11  6 - 20 mg/dL   Creatinine, Ser 0.86 0.44 - 1.00 mg/dL   Calcium 9.8 8.9 - 10.3 mg/dL   Total Protein 7.5 6.5 - 8.1 g/dL   Albumin 4.2 3.5 - 5.0 g/dL   AST 27 15 - 41 U/L   ALT 20 14 - 54 U/L   Alkaline Phosphatase 92 38 - 126 U/L   Total Bilirubin 0.7 0.3 - 1.2 mg/dL   GFR calc non Af Amer >60 >60 mL/min   GFR calc Af Amer >60 >60 mL/min    Comment: (NOTE) The eGFR has been calculated using the CKD EPI equation. This calculation has not been validated in all clinical situations. eGFR's persistently <60 mL/min signify possible Chronic Kidney Disease.    Anion gap 6 5 - 15  Glucose, capillary     Status: None   Collection Time: 10/02/15  6:48 AM  Result Value Ref Range   Glucose-Capillary 89 65 - 99 mg/dL  Glucose, capillary     Status: None   Collection Time: 10/02/15 12:04 PM  Result Value Ref Range   Glucose-Capillary 77 65 - 99 mg/dL     HEENT: normal Cardio: RRR and no murmur Resp:  CTA B/L and unlabored GI: BS positive and NT,ND Extremity:  Pulses positive and No Edema Skin:   Intact Neuro: Abnormal Sensory decreased LT toes of L foot, Abnormal Motor 5/5 in RUE and RLE, 3- L delt bi tri grip HF KE ADF and Abnormal FMC Ataxic/ dec FMC Musc/Skel:  Other mild tenderness over L hip greater troch and surrounding area, no pain with L hip ROM Gen NAD   Assessment/Plan: 1. Functional deficits secondary to Left hemiparesis which require 3+ hours per day of interdisciplinary therapy in a comprehensive inpatient rehab setting. Physiatrist is providing close team supervision and 24 hour management of active medical problems listed below. Physiatrist and rehab team continue to assess barriers to discharge/monitor patient progress toward functional and medical goals. FIM: Function - Bathing Position: Shower Body parts bathed by patient: Left arm, Chest, Abdomen, Right upper leg, Left upper leg Body parts bathed by helper: Right arm, Front perineal area, Buttocks, Right lower leg, Left lower leg, Back  Function- Upper Body Dressing/Undressing What is the patient wearing?: Bra, Pull over shirt/dress Bra - Perfomed by patient: Thread/unthread right bra strap Bra - Perfomed by helper: Thread/unthread left bra strap, Hook/unhook bra (pull down sports bra) Pull over shirt/dress - Perfomed by patient: Thread/unthread right sleeve, Put head through opening Pull over shirt/dress - Perfomed by helper: Thread/unthread left sleeve, Pull shirt over trunk Function - Lower Body Dressing/Undressing What is the patient wearing?: Pants, Non-skid slipper socks, Ted Hose Position: Wheelchair/chair at [employer]- Performed by patient: Thread/unthread right pants leg Pants- Performed by helper: Thread/unthread left pants leg, Pull pants up/down Non-skid slipper socks- Performed by patient: Don/doff right sock Non-skid slipper socks- Performed by helper: Don/doff left sock TED Hose - Performed by  helper: Don/doff right TED hose, Don/doff left TED hose  Function - Toileting Toileting steps completed by helper: Adjust clothing prior to toileting, Performs perineal hygiene, Adjust clothing after toileting Toileting Assistive Devices: Grab bar or rail Assist level: Two helpers  Function - Air cabin crew transfer assistive device: Elevated toilet seat/BSC over toilet, Grab bar Assist level to toilet: Maximal assist (Pt 25 - 49%/lift and lower) Assist level from toilet: Maximal assist (Pt 25 - 49%/lift and lower)  Function - Chair/bed transfer Chair/bed transfer method: Stand pivot Chair/bed transfer assist level: Maximal  assist (Pt 25 - 49%/lift and lower) Chair/bed transfer details: Manual facilitation for weight shifting, Manual facilitation for placement  Function - Locomotion: Wheelchair Will patient use wheelchair at discharge?: Yes Max wheelchair distance: 30 Assist Level: Touching or steadying assistance (Pt > 75%) Wheel 50 feet with 2 turns activity did not occur: Safety/medical concerns Wheel 150 feet activity did not occur: Safety/medical concerns Turns around,maneuvers to table,bed, and toilet,negotiates 3% grade,maneuvers on rugs and over doorsills: No Function - Locomotion: Ambulation Max distance: 15 Assist level: 2 helpers Assist level: 2 helpers Walk 10 feet on uneven surfaces activity did not occur: Safety/medical concerns  Function - Comprehension Comprehension: Auditory Comprehension assist level: Follows basic conversation/direction with no assist  Function - Expression Expression: Verbal Expression assist level: Expresses basic 75 - 89% of the time/requires cueing 10 - 24% of the time. Needs helper to occlude trach/needs to repeat words.  Function - Social Interaction Social Interaction assist level: Interacts appropriately 90% of the time - Needs monitoring or encouragement for participation or interaction.  Function - Problem  Solving Problem solving assist level: Solves basic 75 - 89% of the time/requires cueing 10 - 24% of the time  Function - Memory Memory assist level: Recognizes or recalls 50 - 74% of the time/requires cueing 25 - 49% of the time Patient normally able to recall (first 3 days only): Current season, That he or she is in a hospital Medical Problem List and Plan: 1.  Left side weakness secondary to nonhemorrhagic infarct posterior right corona radiata- Cont CIR PT, OT, SLP 2.  DVT Prophylaxis/Anticoagulation: Subcutaneous Lovenox. Monitor platelet counts in signs of bleeding 3. Pain Management/chronic back pain. 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 Mood/PTSD. Zoloft 50 mg daily, Requip 0.25 mg 3 times a day 5. Neuropsych: This patient is capable of making decisions on her own behalf. 6. Skin/Wound Care: Routine skin checks 7. Fluids/Electrolytes/Nutrition: Routine I&O's follow-up chemistries 8. Tobacco alcohol abuse. Counseling. Consider Nicoderm patch 9. History of GI bleed. Continue Pepcid twice a day. Follow-up CBC 10.  Hx Alcohol abuse denies ETOH for ~48mo LFTs ok 11. Urnary incont will check UA C and S  LOS (Days) 2 A FACE TO FACE EVALUATION WAS PERFORMED  KIRSTEINS,ANDREW E 10/03/2015, 7:38 AM

## 2015-10-03 NOTE — Progress Notes (Signed)
Occupational Therapy Session Note  Patient Details  Name: [name] MRN: [id number] Date of Birth: 1955-06-06  Today's Date: 10/03/2015 OT Individual Time: 1001-1100 OT Individual Time Calculation (min): 59 min    Short Term Goals: Week 1:  OT Short Term Goal 1 (Week 1): Pt will complete LB bathing sit to stand with mod assist. OT Short Term Goal 2 (Week 1): Pt will complete LB dressing sit to stand with mod assist.  OT Short Term Goal 3 (Week 1): Pt will complete toilet transfers with min assist using LRAD. OT Short Term Goal 4 (Week 1): Pt will perform shower transfers with min assist to walk-in shower. OT Short Term Goal 5 (Week 1): Pt will use the LUE as a gross assist for bathing and dressing tasks with min assist.   Skilled Therapeutic Interventions/Progress Updates:    Pt completed shower and dressing during session.  Max assist for stand pivot transfer from wheelchair to tub bench.  Increased trunk lean to the left noted in sitting during bathing task.  She needed min assist to use the LUE as a gross assist for washing the RUE.  Lateral lean to the left with min assist for balance to wash peri area.  Pt still with decreased left knee and hip control with transfers.  Pusher tendencies also noted to the left side in standing, requiring max assist to maintain balance.  Pt completed dressing sit to stand at the sink with mod demonstrational cueing for hemidressing techniques.  Pt left in wheelchair with half lap tray in place and call button and phone in reach.  Encouraged pt to work on washing the lap tray to increase LUE functional movement and use.   Therapy Documentation Precautions:  Precautions Precautions: Fall Precaution Comments: left hemiparesis Restrictions Weight Bearing Restrictions: No  Pain: Pain Assessment Pain Assessment: No/denies pain Pain Score: 3  Pain Type: Chronic pain Pain Location: Back Pain Orientation: Lower Pain Descriptors / Indicators:  Aching Pain Frequency: Intermittent Pain Onset: On-going Patients Stated Pain Goal: 2 Pain Intervention(s): Medication (See eMAR);Repositioned Multiple Pain Sites: No ADL: See Function Navigator for Current Functional Status.   Therapy/Group: Individual Therapy  Taija Mathias OTR/L 10/03/2015, 12:28 PM

## 2015-10-03 NOTE — Progress Notes (Signed)
Speech Language Pathology Daily Session Note  Patient Details  Name: [name] MRN: [id number] Date of Birth: October 05, 1955  Today's Date: 10/03/2015 SLP Individual Time: 1430-1525 SLP Individual Time Calculation (min): 55 min  Short Term Goals: Week 1: SLP Short Term Goal 1 (Week 1): Pt will utilize speech intelligibility strategies at the sentence level with Min A verbal cues to achieve 100% intelligibility.  SLP Short Term Goal 2 (Week 1): Pt will demonstrate functional problem solving for mildly complex tasks with Min A verbal cues.  SLP Short Term Goal 3 (Week 1): Pt will utilize external memory aids to recall new, daily information with Min A verbal and quetion cues.  SLP Short Term Goal 4 (Week 1): Pt will demonstrate selective attention to functional tasks in a moderatey distracting environment for 30 with Min A verbal cues for redirection.  SLP Short Term Goal 5 (Week 1): Pt will demonstrate anticipatory awareness and identify 3 tasks she can participate in at home safely with Min A verbal cues.   Skilled Therapeutic Interventions: Skilled treatment session focused on cognitive and speech intelligibility goals. Upon arrival, patient had been incontinent and requested to have her brief changed. She participated in bed mobility/dressing during self-care with Min A question cues for problem solving. Patient required total A for recall of her speech intelligibility strategies and utilized them at the conversation level with Min-Mod A verbal cues. Patient left supine in bed with all needs within reach. Continue with current plan of care.    Function:  Cognition Comprehension Comprehension assist level: Follows basic conversation/direction with no assist  Expression   Expression assist level: Expresses basic 75 - 89% of the time/requires cueing 10 - 24% of the time. Needs helper to occlude trach/needs to repeat words.  Social Interaction Social Interaction assist level: Interacts  appropriately 90% of the time - Needs monitoring or encouragement for participation or interaction.  Problem Solving Problem solving assist level: Solves basic 75 - 89% of the time/requires cueing 10 - 24% of the time  Memory Memory assist level: Recognizes or recalls 75 - 89% of the time/requires cueing 10 - 24% of the time    Pain Pain Assessment Pain Assessment: 0-10 Pain Score: 2  Pain Type: Chronic pain Pain Location: Back Pain Orientation: Lower;Medial Pain Descriptors / Indicators: Aching Pain Frequency: Intermittent Pain Onset: Gradual Patients Stated Pain Goal: 2 Pain Intervention(s): Medication (See eMAR);Repositioned Multiple Pain Sites: No  Therapy/Group: Individual Therapy  Alakai Macbride 10/03/2015, 3:45 PM

## 2015-10-04 ENCOUNTER — Inpatient Hospital Stay (HOSPITAL_COMMUNITY): Payer: Medicaid Other | Admitting: Occupational Therapy

## 2015-10-04 ENCOUNTER — Inpatient Hospital Stay (HOSPITAL_COMMUNITY): Payer: Medicaid Other | Admitting: *Deleted

## 2015-10-04 LAB — URINE CULTURE

## 2015-10-04 MED ORDER — LIDOCAINE 5 % EX PTCH
1.0000 | MEDICATED_PATCH | CUTANEOUS | Status: DC
  Administered 2015-10-04 – 2015-10-09 (×6): 1 via TRANSDERMAL
  Filled 2015-10-04 (×9): qty 1

## 2015-10-04 MED ORDER — CLOTRIMAZOLE 1 % EX SOLN
Freq: Two times a day (BID) | CUTANEOUS | Status: DC
  Filled 2015-10-04: qty 10

## 2015-10-04 MED ORDER — CLOTRIMAZOLE 1 % EX CREA
TOPICAL_CREAM | Freq: Two times a day (BID) | CUTANEOUS | Status: DC
  Administered 2015-10-04 – 2015-10-08 (×10): via TOPICAL
  Administered 2015-10-09: 1 via TOPICAL
  Administered 2015-10-09: 10:00:00 via TOPICAL
  Administered 2015-10-10: 1 via TOPICAL
  Administered 2015-10-10 – 2015-10-11 (×2): via TOPICAL
  Administered 2015-10-11: 1 via TOPICAL
  Administered 2015-10-12: 21:00:00 via TOPICAL
  Administered 2015-10-12 – 2015-10-13 (×3): 1 via TOPICAL
  Administered 2015-10-14 – 2015-10-18 (×9): via TOPICAL
  Administered 2015-10-19: 1 via TOPICAL
  Administered 2015-10-19: 08:00:00 via TOPICAL
  Administered 2015-10-20: 1 via TOPICAL
  Administered 2015-10-20 – 2015-10-22 (×4): via TOPICAL
  Administered 2015-10-22: 1 via TOPICAL
  Administered 2015-10-23 (×2): via TOPICAL
  Administered 2015-10-24 (×2): 1 via TOPICAL
  Administered 2015-10-25: 20:00:00 via TOPICAL
  Administered 2015-10-25: 1 via TOPICAL
  Administered 2015-10-26: 09:00:00 via TOPICAL
  Filled 2015-10-04: qty 15

## 2015-10-04 NOTE — Progress Notes (Signed)
60 y.o. right handed female with history of chronic back pain, tobacco and alcohol abuse, PTSD, GI bleed. Per chart review patient lives with nieces. Two-level home with bed and bath upstairs.Reported to use a single-point cane prior to admission.  Presented 09/26/2015 with acute onset of left-sided weakness. Cranial CT scan negative. MRI showed a 51m acute ischemic nonhemorrhagic infarct involving the posterior right corona radiata. Multiple remote lacunar infarcts involving the bilateral external capsules/basal ganglia and the right paramedian pons Subjective/Complaints: Left hip pain, mainly with side lying Concerned about "fungus between toes on R side" ROS- no N/V/D, No CP, SOB  Objective: Vital Signs: Blood pressure 112/61, pulse 65, temperature 97.9 F (36.6 C), temperature source Oral, resp. rate 17, SpO2 96 %. Dg Hip Unilat With Pelvis 2-3 Views Left  Result Date: 10/02/2015 CLINICAL DATA:  Generalized left hip pain for 1 year. Recent stroke. EXAM: DG HIP (WITH OR WITHOUT PELVIS) 2-3V LEFT COMPARISON:  None. FINDINGS: Single view of the pelvis and two views of the left hip are provided. Osseous alignment is normal. Bone mineralization is normal. No fracture line or displaced fracture fragment identified. No degenerative change. Soft tissues about the pelvis and left hip are unremarkable. IMPRESSION: Negative. Electronically Signed   By: SFranki CabotM.D.   On: 10/02/2015 16:47   Results for orders placed or performed during the hospital encounter of 10/01/15 (from the past 72 hour(s))  Glucose, capillary     Status: None   Collection Time: 10/01/15  9:01 PM  Result Value Ref Range   Glucose-Capillary 99 65 - 99 mg/dL  CBC WITH DIFFERENTIAL     Status: Abnormal   Collection Time: 10/02/15  5:36 AM  Result Value Ref Range   WBC 4.6 4.0 - 10.5 K/uL   RBC 4.78 3.87 - 5.11 MIL/uL   Hemoglobin 15.0 12.0 - 15.0 g/dL   HCT 44.5 36.0 - 46.0 %   MCV 93.1 78.0 - 100.0 fL   MCH 31.4  26.0 - 34.0 pg   MCHC 33.7 30.0 - 36.0 g/dL   RDW 13.1 11.5 - 15.5 %   Platelets 144 (L) 150 - 400 K/uL   Neutrophils Relative % 38 %   Neutro Abs 1.8 1.7 - 7.7 K/uL   Lymphocytes Relative 48 %   Lymphs Abs 2.2 0.7 - 4.0 K/uL   Monocytes Relative 12 %   Monocytes Absolute 0.6 0.1 - 1.0 K/uL   Eosinophils Relative 2 %   Eosinophils Absolute 0.1 0.0 - 0.7 K/uL   Basophils Relative 0 %   Basophils Absolute 0.0 0.0 - 0.1 K/uL  Comprehensive metabolic panel     Status: None   Collection Time: 10/02/15  5:36 AM  Result Value Ref Range   Sodium 142 135 - 145 mmol/L   Potassium 4.3 3.5 - 5.1 mmol/L   Chloride 109 101 - 111 mmol/L   CO2 27 22 - 32 mmol/L   Glucose, Bld 81 65 - 99 mg/dL   BUN 11 6 - 20 mg/dL   Creatinine, Ser 0.86 0.44 - 1.00 mg/dL   Calcium 9.8 8.9 - 10.3 mg/dL   Total Protein 7.5 6.5 - 8.1 g/dL   Albumin 4.2 3.5 - 5.0 g/dL   AST 27 15 - 41 U/L   ALT 20 14 - 54 U/L   Alkaline Phosphatase 92 38 - 126 U/L   Total Bilirubin 0.7 0.3 - 1.2 mg/dL   GFR calc non Af Amer >60 >60 mL/min   GFR calc  Af Amer >60 >60 mL/min    Comment: (NOTE) The eGFR has been calculated using the CKD EPI equation. This calculation has not been validated in all clinical situations. eGFR's persistently <60 mL/min signify possible Chronic Kidney Disease.    Anion gap 6 5 - 15  Glucose, capillary     Status: None   Collection Time: 10/02/15  6:48 AM  Result Value Ref Range   Glucose-Capillary 89 65 - 99 mg/dL  Glucose, capillary     Status: None   Collection Time: 10/02/15 12:04 PM  Result Value Ref Range   Glucose-Capillary 77 65 - 99 mg/dL  Urinalysis, Routine w reflex microscopic (not at [facility])     Status: None   Collection Time: 10/03/15  7:50 PM  Result Value Ref Range   Color, Urine YELLOW YELLOW   APPearance CLEAR CLEAR   Specific Gravity, Urine 1.006 1.005 - 1.030   pH 6.5 5.0 - 8.0   Glucose, UA NEGATIVE NEGATIVE mg/dL   Hgb urine dipstick NEGATIVE NEGATIVE   Bilirubin Urine  NEGATIVE NEGATIVE   Ketones, ur NEGATIVE NEGATIVE mg/dL   Protein, ur NEGATIVE NEGATIVE mg/dL   Nitrite NEGATIVE NEGATIVE   Leukocytes, UA NEGATIVE NEGATIVE    Comment: MICROSCOPIC NOT DONE ON URINES WITH NEGATIVE PROTEIN, BLOOD, LEUKOCYTES, NITRITE, OR GLUCOSE <1000 mg/dL.     HEENT: normal Cardio: RRR and no murmur Resp: CTA B/L and unlabored GI: BS positive and NT,ND Extremity:  Pulses positive and No Edema Skin:  Small scab and hyperpigmentation between toes on R foot digits 4 and 5 Neuro: Abnormal Sensory decreased LT toes of L foot, Abnormal Motor 5/5 in RUE and RLE, 3- L delt bi tri grip HF KE ADF and Abnormal FMC Ataxic/ dec FMC Musc/Skel:  Other mild tenderness over L hip greater troch and surrounding area, no pain with L hip ROM Gen NAD   Assessment/Plan: 1. Functional deficits secondary to Left hemiparesis which require 3+ hours per day of interdisciplinary therapy in a comprehensive inpatient rehab setting. Physiatrist is providing close team supervision and 24 hour management of active medical problems listed below. Physiatrist and rehab team continue to assess barriers to discharge/monitor patient progress toward functional and medical goals. FIM: Function - Bathing Position: Shower Body parts bathed by patient: Left arm, Chest, Abdomen, Right upper leg, Left upper leg, Left lower leg, Right arm, Buttocks (lateral lean on tub bench for buttocks) Body parts bathed by helper: Front perineal area  Function- Upper Body Dressing/Undressing What is the patient wearing?: Bra, Pull over shirt/dress Bra - Perfomed by patient: Thread/unthread right bra strap, Thread/unthread left bra strap Bra - Perfomed by helper: Hook/unhook bra (pull down sports bra) Pull over shirt/dress - Perfomed by patient: Thread/unthread right sleeve, Thread/unthread left sleeve, Put head through opening, Pull shirt over trunk Pull over shirt/dress - Perfomed by helper: Thread/unthread left sleeve, Pull  shirt over trunk Function - Lower Body Dressing/Undressing What is the patient wearing?: Pants, Maryln Manuel, Shoes Position: Education officer, museum at [employer]- Performed by patient: Thread/unthread right pants leg, Thread/unthread left pants leg Pants- Performed by helper: Pull pants up/down Non-skid slipper socks- Performed by patient: Don/doff right sock Non-skid slipper socks- Performed by helper: Don/doff left sock Shoes - Performed by patient: Don/doff right shoe Shoes - Performed by helper: Don/doff left shoe, Fasten right, Fasten left TED Hose - Performed by helper: Don/doff right TED hose, Don/doff left TED hose  Function - Toileting Toileting steps completed by helper: Adjust clothing prior to toileting, Performs  perineal hygiene, Adjust clothing after toileting Toileting Assistive Devices: Grab bar or rail Assist level: Two helpers  Function - Air cabin crew transfer assistive device: Elevated toilet seat/BSC over toilet Assist level to toilet: 2 helpers Assist level from toilet: 2 helpers  Function - Chair/bed transfer Chair/bed transfer [place]: Stand pivot Chair/bed transfer assist level: 2 helpers Chair/bed transfer details: Manual facilitation for weight shifting, Manual facilitation for placement  Function - Locomotion: Wheelchair Will patient use wheelchair at discharge?: Yes Max wheelchair distance: 30 Assist Level: Touching or steadying assistance (Pt > 75%) Wheel 50 feet with 2 turns activity did not occur: Safety/medical concerns Wheel 150 feet activity did not occur: Safety/medical concerns Turns around,maneuvers to table,bed, and toilet,negotiates 3% grade,maneuvers on rugs and over doorsills: No Function - Locomotion: Ambulation Max distance: 15 Assist level: 2 helpers Assist level: 2 helpers Walk 10 feet on uneven surfaces activity did not occur: Safety/medical concerns  Function - Comprehension Comprehension: Auditory Comprehension assist level:  Follows basic conversation/direction with no assist  Function - Expression Expression: Verbal Expression assist level: Expresses basic 75 - 89% of the time/requires cueing 10 - 24% of the time. Needs helper to occlude trach/needs to repeat words.  Function - Social Interaction Social Interaction assist level: Interacts appropriately 90% of the time - Needs monitoring or encouragement for participation or interaction.  Function - Problem Solving Problem solving assist level: Solves basic 75 - 89% of the time/requires cueing 10 - 24% of the time  Function - Memory Memory assist level: Recognizes or recalls 75 - 89% of the time/requires cueing 10 - 24% of the time Patient normally able to recall (first 3 days only): Current season, That he or she is in a hospital Medical Problem List and Plan: 1.  Left side weakness secondary to nonhemorrhagic infarct posterior right corona radiata- Cont CIR PT, OT, SLP 2.  DVT Prophylaxis/Anticoagulation: Subcutaneous Lovenox. Monitor platelet counts in signs of bleeding 3. Pain Management/chronic back pain. 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 Mood/PTSD. Zoloft 50 mg daily, Requip 0.25 mg 3 times a day 5. Neuropsych: This patient is capable of making decisions on her own behalf. 6. Skin/Wound Care: Routine skin checks, prob tinea pedis R foot start clotrimazole BID 7. Fluids/Electrolytes/Nutrition: Routine I&O's follow-up chemistries 8. Tobacco alcohol abuse. Counseling. Consider Nicoderm patch 9. History of GI bleed. Continue Pepcid twice a day. Follow-up CBC 10.  Hx Alcohol abuse denies ETOH for ~35mo LFTs ok 11. 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

## 2015-10-04 NOTE — Progress Notes (Signed)
Occupational Therapy Session Note  Patient Details  Name: [name] MRN: [id number] Date of Birth: August 17, 1955  Today's Date: 10/04/2015 OT Individual Time: 1102-1208 OT Individual Time Calculation (min): 66 min     Short Term Goals: Week 1:  OT Short Term Goal 1 (Week 1): Pt will complete LB bathing sit to stand with mod assist. OT Short Term Goal 2 (Week 1): Pt will complete LB dressing sit to stand with mod assist.  OT Short Term Goal 3 (Week 1): Pt will complete toilet transfers with min assist using LRAD. OT Short Term Goal 4 (Week 1): Pt will perform shower transfers with min assist to walk-in shower. OT Short Term Goal 5 (Week 1): Pt will use the LUE as a gross assist for bathing and dressing tasks with min assist.       Skilled Therapeutic Interventions/Progress Updates:    Pt was lying supine in bed upon skilled OT arrival and reported needing to void. Pt completed supine to sit with touching assistance for steadying balance and transferred to bedside commode with Mod A stand pivot. Pt required cuing for proper hand and LE placement before and during transfer. Difficultly with lifting L LE noted with tactile cues to lift. Nursing notified on pts output.  Pt completed AM ADLs standing as needed sinkside in wheelchair. Pt exhibited strong left pushing when standing for LB ADL portions with improvement given vcs and visual biofeedback. Pt required Mod A for standing balance while pt completed pericare and hiked pants on right side. Pt required max vcs to integrate L UE as stabilizer and gross assist. Mod cues for appropriate demonstration of hemi techniques. Pt educated on WB L UE at rest and methods for incorporating L UE into self feeding. Pt left in w/c with half lap tray and QRB in place and setup for lunch.   Therapy Documentation Precautions:  Precautions Precautions: Fall Precaution Comments: left hemiparesis Restrictions Weight Bearing Restrictions: No   Pain: 7/10 with  nursing medicating pt during session Pain Assessment Pain Score: 7  Pain Type: Chronic pain Pain Location: Back Pain Radiating Towards: left hip Pain Descriptors / Indicators: Aching;Discomfort Patients Stated Pain Goal: 3 Pain Intervention(s): Medication (See eMAR) :    See Function Navigator for Current Functional Status.   Therapy/Group: Individual Therapy  Macyn Remmert A Esly Selvage 10/04/2015, 12:40 PM

## 2015-10-05 ENCOUNTER — Inpatient Hospital Stay (HOSPITAL_COMMUNITY): Payer: Medicaid Other | Admitting: Occupational Therapy

## 2015-10-05 ENCOUNTER — Inpatient Hospital Stay (HOSPITAL_COMMUNITY): Payer: Medicaid Other | Admitting: Speech Pathology

## 2015-10-05 ENCOUNTER — Inpatient Hospital Stay (HOSPITAL_COMMUNITY): Payer: Medicaid Other

## 2015-10-05 DIAGNOSIS — M6289 Other specified disorders of muscle: Secondary | ICD-10-CM

## 2015-10-05 MED ORDER — SENNOSIDES-DOCUSATE SODIUM 8.6-50 MG PO TABS
2.0000 | ORAL_TABLET | Freq: Two times a day (BID) | ORAL | Status: DC
  Administered 2015-10-05 – 2015-10-19 (×24): 2 via ORAL
  Administered 2015-10-20: 1 via ORAL
  Administered 2015-10-21 – 2015-10-26 (×8): 2 via ORAL
  Filled 2015-10-05 (×40): qty 2

## 2015-10-05 NOTE — Progress Notes (Signed)
60 y.o. right handed female with history of chronic back pain, tobacco and alcohol abuse, PTSD, GI bleed. Per chart review patient lives with nieces. Two-level home with bed and bath upstairs.Reported to use a single-point cane prior to admission.  Presented 09/26/2015 with acute onset of left-sided weakness. Cranial CT scan negative. 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 Multiple remote lacunar infarcts involving the bilateral external capsules/basal ganglia and the right paramedian pons Subjective/Complaints: Left hip pain, mainly with side lying Concerned about "fungus between toes on R side" ROS- no N/V/D, No CP, SOB  Objective: Vital Signs: Blood pressure 115/65, pulse 70, temperature 97.8 F (36.6 C), temperature source Oral, resp. rate 20, SpO2 98 %. No results found. Results for orders placed or performed during the hospital encounter of 10/01/15 (from the past 72 hour(s))  Glucose, capillary     Status: None   Collection Time: 10/02/15 12:04 PM  Result Value Ref Range   Glucose-Capillary 77 65 - 99 mg/dL  Urinalysis, Routine w reflex microscopic (not at [facility])     Status: None   Collection Time: 10/03/15  7:50 PM  Result Value Ref Range   Color, Urine YELLOW YELLOW   APPearance CLEAR CLEAR   Specific Gravity, Urine 1.006 1.005 - 1.030   pH 6.5 5.0 - 8.0   Glucose, UA NEGATIVE NEGATIVE mg/dL   Hgb urine dipstick NEGATIVE NEGATIVE   Bilirubin Urine NEGATIVE NEGATIVE   Ketones, ur NEGATIVE NEGATIVE mg/dL   Protein, ur NEGATIVE NEGATIVE mg/dL   Nitrite NEGATIVE NEGATIVE   Leukocytes, UA NEGATIVE NEGATIVE    Comment: MICROSCOPIC NOT DONE ON URINES WITH NEGATIVE PROTEIN, BLOOD, LEUKOCYTES, NITRITE, OR GLUCOSE <1000 mg/dL.  Urine culture     Status: Abnormal   Collection Time: 10/03/15  7:50 PM  Result Value Ref Range   Specimen Description URINE, CLEAN CATCH    Special Requests NONE    Culture MULTIPLE SPECIES  PRESENT, SUGGEST RECOLLECTION (A)    Report Status 10/04/2015 FINAL      HEENT: normal Cardio: RRR and no murmur Resp: CTA B/L and unlabored GI: BS positive and NT,ND Extremity:  Pulses positive and No Edema Skin:  Small scab and hyperpigmentation between toes on R foot digits 4 and 5 Neuro: Abnormal Sensory decreased LT toes of L foot, Abnormal Motor 5/5 in RUE and RLE, 3- L delt bi tri grip HF KE ADF and Abnormal FMC Ataxic/ dec FMC Musc/Skel:  Other mild tenderness over L hip greater troch and surrounding area, no pain with L hip ROM Gen NAD   Assessment/Plan: 1. Functional deficits secondary to Left hemiparesis which require 3+ hours per day of interdisciplinary therapy in a comprehensive inpatient rehab setting. Physiatrist is providing close team supervision and 24 hour management of active medical problems listed below. Physiatrist and rehab team continue to assess barriers to discharge/monitor patient progress toward functional and medical goals. FIM: Function - Bathing Position: Wheelchair/chair at sink Body parts bathed by patient: Right arm, Left arm, Chest, Abdomen, Front perineal area, Right upper leg, Left upper leg, Right lower leg, Left lower leg Body parts bathed by helper: Buttocks, Back Assist Level: Touching or steadying assistance(Pt > 75%)  Function- Upper Body Dressing/Undressing What is the patient wearing?: Bra, Pull over shirt/dress Bra - Perfomed by patient: Thread/unthread right bra strap, Thread/unthread left bra strap Bra - Perfomed by helper: Thread/unthread right bra strap, Thread/unthread left bra strap, Hook/unhook bra (pull down sports bra) Pull over  shirt/dress - Perfomed by patient: Thread/unthread right sleeve, Put head through opening, Pull shirt over trunk Pull over shirt/dress - Perfomed by helper: Thread/unthread left sleeve Assist Level: Touching or steadying assistance(Pt > 75%) Function - Lower Body Dressing/Undressing What is the patient  wearing?: Pants, Shoes, Ted Hose Position: Wheelchair/chair at [employer]- Performed by patient: Thread/unthread right pants leg, Thread/unthread left pants leg Pants- Performed by helper: Pull pants up/down Non-skid slipper socks- Performed by patient: Don/doff right sock Non-skid slipper socks- Performed by helper: Don/doff left sock Shoes - Performed by patient: Don/doff right shoe Shoes - Performed by helper: Don/doff right shoe, Don/doff left shoe, Fasten right, Fasten left TED Hose - Performed by helper: Don/doff right TED hose, Don/doff left TED hose Assist for footwear: Maximal assist Assist for lower body dressing: Touching or steadying assistance (Pt > 75%)  Function - Toileting Toileting steps completed by patient: Performs perineal hygiene Toileting steps completed by helper: Adjust clothing prior to toileting, Adjust clothing after toileting Toileting Assistive Devices: Grab bar or rail Assist level: Touching or steadying assistance (Pt.75%)  Function - Archivist transfer assistive device: Bedside commode Assist level to toilet: 2 helpers Assist level from toilet: 2 helpers Assist level to bedside commode (at bedside): Moderate assist (Pt 50 - 74%/lift or lower) Assist level from bedside commode (at bedside): Moderate assist (Pt 50 - 74%/lift or lower)  Function - Chair/bed transfer Chair/bed transfer method: Stand pivot Chair/bed transfer assist level: Moderate assist (Pt 50 - 74%/lift or lower) Chair/bed transfer assistive device: Armrests, Bedrails Chair/bed transfer details: Manual facilitation for weight shifting, Manual facilitation for placement  Function - Locomotion: Wheelchair Will patient use wheelchair at discharge?: Yes Max wheelchair distance: 30 Assist Level: Touching or steadying assistance (Pt > 75%) Wheel 50 feet with 2 turns activity did not occur: Safety/medical concerns Wheel 150 feet activity did not occur: Safety/medical  concerns Turns around,maneuvers to table,bed, and toilet,negotiates 3% grade,maneuvers on rugs and over doorsills: No Function - Locomotion: Ambulation Max distance: 15 Assist level: 2 helpers Assist level: 2 helpers Walk 10 feet on uneven surfaces activity did not occur: Safety/medical concerns  Function - Comprehension Comprehension: Auditory Comprehension assist level: Follows basic conversation/direction with no assist  Function - Expression Expression: Verbal Expression assist level: Expresses basic needs/ideas: With extra time/assistive device  Function - Social Interaction Social Interaction assist level: Interacts appropriately 90% of the time - Needs monitoring or encouragement for participation or interaction.  Function - Problem Solving Problem solving assist level: Solves basic 75 - 89% of the time/requires cueing 10 - 24% of the time  Function - Memory Memory assist level: Recognizes or recalls 90% of the time/requires cueing < 10% of the time Patient normally able to recall (first 3 days only): Current season, That he or she is in a hospital Medical Problem List and Plan: 1.  Left side weakness secondary to nonhemorrhagic infarct posterior right corona radiata- Cont CIR PT, OT, SLP 2.  DVT Prophylaxis/Anticoagulation: Subcutaneous Lovenox. Monitor platelet counts in signs of bleeding 3. Pain Management/chronic back pain. 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 Mood/PTSD. Zoloft 50 mg daily, Requip 0.25 mg 3 times a day 5. Neuropsych: This patient is capable of making decisions on her own behalf. 6. Skin/Wound Care: Routine skin  checks, prob tinea pedis R foot start clotrimazole BID 7. Fluids/Electrolytes/Nutrition: Routine I&O's follow-up chemistries 8. Tobacco alcohol abuse.  Counseling. Consider Nicoderm patch 9. History of GI bleed. Continue Pepcid twice a day. Follow-up CBC 10.  Hx Alcohol abuse denies ETOH for ~70mo, LFTs ok 11. 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  Constipation start senna S  LOS (Days) 4 A FACE TO FACE EVALUATION WAS PERFORMED  Clayburn Weekly E 10/05/2015, 7:19 AM

## 2015-10-05 NOTE — Progress Notes (Signed)
Occupational Therapy Session Note  Patient Details  Name: [name] MRN: [id number] Date of Birth: Jul 21, 1955  Today's Date: 10/05/2015 OT Individual Time:  - 1000-1100  (60 min)       Short Term Goals: Week 1:  OT Short Term Goal 1 (Week 1): Pt will complete LB bathing sit to stand with mod assist. OT Short Term Goal 2 (Week 1): Pt will complete LB dressing sit to stand with mod assist.  OT Short Term Goal 3 (Week 1): Pt will complete toilet transfers with min assist using LRAD. OT Short Term Goal 4 (Week 1): Pt will perform shower transfers with min assist to walk-in shower. OT Short Term Goal 5 (Week 1): Pt will use the LUE as a gross assist for bathing and dressing tasks with min assist.  Week 2:     Skilled Therapeutic Interventions/Progress Updates:   Engaged in bathing and dressing at shower level.  Transfer to wc to 3n1 over toilet with mod assist.  Continent of large BM.  Transfer to wc to TTB with mod assist stand pivot and  Grab bars.  Ppt bathed in sitting and standing with min to mod assist for balance.  Transferred to wc with mod assist and dressed self with assist.  Left in wc in room with all needs in reach.    Therapy Documentation Precautions:  Precautions Precautions: Fall Precaution Comments: left hemiparesis Restrictions Weight Bearing Restrictions: No       Pain: Pain Assessment Pain Score: 4            See Function Navigator for Current Functional Status.   Therapy/Group: Individual Therapy  Humberto Seals 10/05/2015, 10:08 AM

## 2015-10-05 NOTE — Care Management (Signed)
[facility] Individual Statement of Services  Patient Name:  [name]  Date:  10/05/2015  Welcome to the [facility].  Our goal is to provide you with an individualized program based on your diagnosis and situation, designed to meet your specific needs.  With this comprehensive rehabilitation program, you will be expected to participate in at least 3 hours of rehabilitation therapies Monday-Friday, with modified therapy programming on the weekends.  Your rehabilitation program will include the following services:  Physical Therapy (PT), Occupational Therapy (OT), Speech Therapy (ST), 24 hour per day rehabilitation nursing, Therapeutic Recreaction (TR), Neuropsychology, Case Management (Social Worker), Rehabilitation Medicine, Nutrition Services and Pharmacy Services  Weekly team conferences will be held on Wednesday  to discuss your progress.  Your Social Worker will talk with you frequently to get your input and to update you on team discussions.  Team conferences with you and your family in attendance may also be held.  Expected length of stay: 17-19 days  Overall anticipated outcome: supervision/min assist level  Depending on your progress and recovery, your program may change. Your Social Worker will coordinate services and will keep you informed of any changes. Your Social Worker's name and contact numbers are listed  below.  The following services may also be recommended but are not provided by the [facility]:    Home Health Rehabiltiation Services  Outpatient Rehabilitation Services    Arrangements will be made to provide these services after discharge if needed.  Arrangements include referral to agencies that provide these services.  Your insurance has been verified to be:  Medicaid Your primary doctor is:  Mohummad Garba  Pertinent information will be shared with your doctor and your insurance company.  Social  Worker:  Dossie Der, SW [phone] or ([phone]  Information discussed with and copy given to patient by: Lucy Chris, 10/05/2015, 9:39 AM

## 2015-10-05 NOTE — Progress Notes (Signed)
Physical Therapy Note  Patient Details  Name: [name] MRN: [id number] Date of Birth: [birth year] Today's Date: 10/05/2015  6415-8309, 75 min Pain: 7/10 L low back, premedicated, pre-morbid  Bed mobility using R rail, min assist.  bil bridging in bed for hygiene and change of brief and donning pants with mod assist.  Pt donned shirt sitting EOB with min assist. Sit> stand from raised bed with mod assist; pt able to extend L knee briefly on command.  Pt limited by pain L dorsuim of foot. Sit> stand> pivot to w/c with mod assist. W/c propulsion using hemi method x 100' with supervisin, using floor tiles in diamond pattern for visual cues for straight path.  neuromuscular re-education via manual and VCS, mirror feedback for midline orientation; terminal hip ext; = static standing, wt shifting L> R to manipulate small objects with L or R hand, all in static stander x 15 minutes. Also self stretching L hamstrings and heel cords in sitting with L foot propped on footrest, x 30 seconds x 2. Pt left resting in w/c with quick release belt applied and all needs within reach  Ophelia Sipe 10/05/2015, 7:57 AM

## 2015-10-05 NOTE — Progress Notes (Signed)
Social Work Assessment and Plan Social Work Assessment and Plan  Patient Details  Name: [name] MRN: [id number] Date of Birth: 1955/06/06  Today's Date: 10/05/2015  Problem List:  Patient Active Problem List   Diagnosis Date Noted  . Chronic back pain   . ETOH abuse   . Hypokalemia   . PTSD (post-traumatic stress disorder)   . Gastroesophageal reflux disease without esophagitis   . Acute ischemic stroke (HCC) 09/27/2015  . Acute CVA (cerebrovascular accident) (HCC) 09/27/2015  . Tobacco abuse   . Left-sided weakness 09/26/2015  . Acute gastric ulcer   . GI bleed 05/22/2014  . Acute GI bleeding 05/22/2014  . Acute blood loss anemia 05/22/2014  . History of CVA (cerebrovascular accident) 05/22/2014  . Hyperlipidemia 05/22/2014  . Gastrointestinal hemorrhage with melena   . CVA (cerebral infarction) 10/09/2012  . Flank pain 10/09/2012  . Right pontine stroke (HCC) 10/09/2012  . ABDOMINAL PAIN OTHER SPECIFIED SITE 05/17/2010  . ALCOHOL ABUSE 06/16/2007  . COCAINE ABUSE 06/16/2007  . DEPRESSION 06/16/2007  . Essential hypertension 06/16/2007  . CEREBROVASCULAR ACCIDENT 06/16/2007  . ASTHMA 06/16/2007  . GERD 06/16/2007  . PANCREATITIS 11/09/2006  . ESOPHAGITIS, REFLUX 05/23/2006   Past Medical History:  Past Medical History:  Diagnosis Date  . Alcohol abuse   . Chronic back pain   . Colitis   . CVA (cerebral infarction)   . Fatty liver   . Gastric ulcer   . GI bleed   . Hepatitis    Hepatitis C  . MDD (major depressive disorder) (HCC)   . Pancreatitis   . PTSD (post-traumatic stress disorder)   . Stroke (HCC)   . UTI (lower urinary tract infection)    Past Surgical History:  Past Surgical History:  Procedure Laterality Date  . ABDOMINAL HYSTERECTOMY    . APPENDECTOMY    . COLONOSCOPY WITH PROPOFOL N/A 08/13/2015   Procedure: COLONOSCOPY WITH PROPOFOL;  Surgeon: Rachael Fee, MD;  Location: [facility] ENDOSCOPY;  Service: Endoscopy;  Laterality: N/A;  .  ESOPHAGOGASTRODUODENOSCOPY N/A 05/23/2014   Procedure: ESOPHAGOGASTRODUODENOSCOPY (EGD);  Surgeon: Beverley Fiedler, MD;  Location: [facility] ENDOSCOPY;  Service: Endoscopy;  Laterality: N/A;  . ESOPHAGOGASTRODUODENOSCOPY (EGD) WITH PROPOFOL N/A 08/13/2015   Procedure: ESOPHAGOGASTRODUODENOSCOPY (EGD) WITH PROPOFOL;  Surgeon: Rachael Fee, MD;  Location: [facility] ENDOSCOPY;  Service: Endoscopy;  Laterality: N/A;  . ESOPHAGOGASTRODUODENOSCOPY ENDOSCOPY    . gall stone removed     Social History:  reports that she has been smoking Cigarettes.  She has been smoking about 0.50 packs per day. She quit smokeless tobacco use about 37 years ago. Her smokeless tobacco use included Chew. She reports that she does not drink alcohol or use drugs.  Family / Support Systems Marital Status: Widow/Widower Patient Roles: Other (Comment) (aunt) Other Supports: Jim Like [phone] Anticipated Caregiver: Nieces and CAP worker Ability/Limitations of Caregiver: None Caregiver Availability: 24/7 Family Dynamics: Close knit with her nieces and nephews, she has no children of her own. She had numerous siblings and extended family. She knows they will take care of her when she is ready to go home.  Social History Preferred language: English Religion: Non-Denominational Cultural Background: No issues Education: High School Read: Yes Write: Yes Employment Status: Disabled Fish farm manager Issues: No issues Guardian/Conservator: None-according to MD pt is capable of making her own decisions while here. Will also make sure niece knows what is going on   Abuse/Neglect Physical Abuse: Denies Verbal Abuse: Denies Sexual Abuse: Denies Exploitation of  patient/patient's resources: Denies Self-Neglect: Denies  Emotional Status Pt's affect, behavior adn adjustment status: Pt is motivated to improve she does have visual issues from a previous stroke. She is hoping she will get back to walking with a cane before  she leaves here.  Recent Psychosocial Issues: past history of cva and other health issues Pyschiatric History: history of depression and PTSD is managed by medication. She would benefit from neuro-psych seeing while here. Although she feels she is a good place right now. Will monitor while here Substance Abuse History: Quit ETOH 01/2015 but was still smoking and aware should quit, plans to try this time. Aware of the resources available to her, will continue to discuss while here.  Patient / Family Perceptions, Expectations & Goals Pt/Family understanding of illness & functional limitations: Pt and niece is aware of her stroke and deficits. They both have spoken with the MD and feel they have a basic understanding of her treatment plan. Premorbid pt/family roles/activities: Aunt, Probation officer, widower, retiree, friend, etc Anticipated changes in roles/activities/participation: resume Pt/family expectations/goals: Pt states: " I want to do as much as I can for myself."  Niece states: " We will help her we have been."  [employer]: Other (Comment) (Has CAP worker) Premorbid Home Care/DME Agencies: Other (Comment) (has DME from previous admits) Transportation available at discharge: Family Resource referrals recommended: Neuropsychology, Support group (specify)  Discharge Planning Living Arrangements: Other relatives Support Systems: Other relatives, Friends/neighbors Type of Residence: Private residence Insurance Resources: [employer] (specify county) Architect: [employer] Financial Screen Referred: No Living Expenses: Lives with family Money Management: Family Does the patient have any problems obtaining your medications?: No Home Management: family and pt helps with what she can Patient/Family Preliminary Plans: Return home with [name] who has been assisting her, but not with physical care, that was not needed prior to admission. She does have a CAP worker in the  home 40 hours per week which will help niece when she has other errands to run. Social Work Anticipated Follow Up Needs: HH/OP, Support Group  Clinical Impression Pleasant female who is willing to work in therapies and recover from this stroke. She is positive and optimistic she will do well here. Niece is supportive and willing to assist her in her care. Await team conference and work on discharge plans. Will refer to neuro-psych would benefit while here.  Lucy Chris 10/05/2015, 10:11 AM

## 2015-10-05 NOTE — Progress Notes (Signed)
Speech Language Pathology Daily Session Note  Patient Details  Name: Mckayle Brei MRN: [id number] Date of Birth: 01-30-1956  Today's Date: 10/05/2015 SLP Individual Time: 1105-1200 SLP Individual Time Calculation (min): 55 min   Short Term Goals: Week 1: SLP Short Term Goal 1 (Week 1): Pt will utilize speech intelligibility strategies at the sentence level with Min A verbal cues to achieve 100% intelligibility.  SLP Short Term Goal 2 (Week 1): Pt will demonstrate functional problem solving for mildly complex tasks with Min A verbal cues.  SLP Short Term Goal 3 (Week 1): Pt will utilize external memory aids to recall new, daily information with Min A verbal and quetion cues.  SLP Short Term Goal 4 (Week 1): Pt will demonstrate selective attention to functional tasks in a moderatey distracting environment for 30 with Min A verbal cues for redirection.  SLP Short Term Goal 5 (Week 1): Pt will demonstrate anticipatory awareness and identify 3 tasks she can participate in at home safely with Min A verbal cues.   Skilled Therapeutic Interventions: Skilled treatment session focused on addressing cognition goals. SLP facilitated session by providing Max faded to Mod assist verbal and visual cues to recall and complete procedures for a novel, mildly complex problem solving task.  Upon return to room patient's lunch tray present and patient was able to verbally direct SLP to set-up tray with Min question cues.  Continue with current plan of care.    Function:  Cognition Comprehension Comprehension assist level: Follows basic conversation/direction with extra time/assistive device  Expression   Expression assist level: Expresses basic 75 - 89% of the time/requires cueing 10 - 24% of the time. Needs helper to occlude trach/needs to repeat words.  Social Interaction Social Interaction assist level: Interacts appropriately 90% of the time - Needs monitoring or encouragement for participation or  interaction.  Problem Solving Problem solving assist level: Solves basic 75 - 89% of the time/requires cueing 10 - 24% of the time  Memory Memory assist level: Recognizes or recalls 75 - 89% of the time/requires cueing 10 - 24% of the time    Pain Pain Assessment Pain Assessment: No/denies pain  Therapy/Group: Individual Therapy  Charlane Ferretti., CCC-SLP [phone]  Mercy Leppla 10/05/2015, 12:04 PM

## 2015-10-05 NOTE — IPOC Note (Signed)
Overall Plan of Care [facility]) Patient Details Name: [name] MRN: [id number] DOB: 1956/02/09  Admitting Diagnosis: Stroke   Hospital Problems: Principal Problem:   Left-sided weakness Active Problems:   Right pontine stroke (HCC)   ETOH abuse     Functional Problem List: Nursing Bladder, Bowel, Endurance, Motor, Pain, Safety  PT Balance, Endurance, Motor, Sensory  OT Balance, Motor, Endurance, Perception, Sensory, Vision, Cognition  SLP Cognition  TR         Basic ADL's: OT Eating, Grooming, Bathing, Dressing, Toileting     Advanced  ADL's: OT Simple Meal Preparation     Transfers: PT Bed Mobility, Bed to Chair, Car, Occupational psychologist, Research scientist (life sciences): PT Stairs, Ambulation, Wheelchair Mobility     Additional Impairments: OT Fuctional Use of Upper Extremity  SLP Communication expression (Dysarthria)    TR      Anticipated Outcomes Item Anticipated Outcome  Self Feeding modified independent  Swallowing      Basic self-care  supervision to min assist  Toileting  supervision to min assist   Bathroom Transfers supervision to min assist  Bowel/Bladder  Continent to bladde and bowel with min. assisst.  Transfers  supervision  Locomotion  min A gait, supervision w/c  Communication  Mod I  Cognition  Mod I  Pain  Less than 3,on 1 to 10 scale.  Safety/Judgment  Free from falls during his stay in rehab.   Therapy Plan: PT Intensity: Minimum of 1-2 x/day ,45 to 90 minutes PT Frequency: 5 out of 7 days PT Duration Estimated Length of Stay: 18-21 days OT Intensity: Minimum of 1-2 x/day, 45 to 90 minutes OT Frequency: 5 out of 7 days OT Duration/Estimated Length of Stay: 17-19 days SLP Intensity: Minumum of 1-2 x/day, 30 to 90 minutes SLP Frequency: 3 to 5 out of 7 days SLP Duration/Estimated Length of Stay: 18 to 21 days       Team Interventions: Nursing Interventions Patient/Family Education, Disease Management/Prevention, Pain  Management, Discharge Planning  PT interventions Ambulation/gait training, Discharge planning, Functional mobility training, Therapeutic Activities, Therapeutic Exercise, Wheelchair propulsion/positioning, Neuromuscular re-education, Warden/ranger, Cognitive remediation/compensation, Community reintegration, Development worker, international aid stimulation, Patient/family education, Museum/gallery curator, UE/LE Coordination activities, UE/LE Strength taining/ROM, Splinting/orthotics, Pain management, DME/adaptive equipment instruction  OT Interventions Warden/ranger, Cognitive remediation/compensation, Community reintegration, Discharge planning, Functional mobility training, Functional electrical stimulation, DME/adaptive equipment instruction, Disease mangement/prevention, Neuromuscular re-education, Pain management, Patient/family education, Psychosocial support, Therapeutic Activities, Self Care/advanced ADL retraining, Therapeutic Exercise, UE/LE Strength taining/ROM, UE/LE Coordination activities, Visual/perceptual remediation/compensation, Wheelchair propulsion/positioning  SLP Interventions Cognitive remediation/compensation, Patient/family education, Speech/Language facilitation  TR Interventions    SW/CM Interventions Discharge Planning, Psychosocial Support, Patient/Family Education    Team Discharge Planning: Destination: PT-Home ,OT- Home , SLP-Home Projected Follow-up: PT-Home health PT, 24 hour supervision/assistance, OT-  Home health OT, SLP-None Projected Equipment Needs: PT-To be determined, OT- To be determined, SLP-None recommended by SLP Equipment Details: PT- , OT-  Patient/family involved in discharge planning: PT- Patient,  OT-Patient, SLP-Patient  MD ELOS: 14-18d Medical Rehab Prognosis:  Good Assessment:  60 y.o. right handed female with history of chronic back pain, tobacco and alcohol abuse, PTSD, GI bleed. Per chart review patient lives with nieces. Two-level home  with bed and bath upstairs.Reported to use a single-point cane prior to admission.  Presented 09/26/2015 with acute onset of left-sided weakness. Cranial CT scan negative. MRI showed a 15mm acute ischemic nonhemorrhagic infarct involving the posterior right corona radiata. Multiple remote lacunar infarcts  involving the bilateral external capsules/basal ganglia and the right paramedian pons. MRA of the head negative. Patient did not receive TPA. Echocardiogram with ejection fraction of 55% grade 2 diastolic dysfunction.    Now requiring 24/7 Rehab RN,MD, as well as CIR level PT, OT and SLP.  Treatment team will focus on ADLs and mobility with goals set at Sup/MinA   See Team Conference Notes for weekly updates to the plan of care

## 2015-10-05 NOTE — Plan of Care (Signed)
Problem: RH BOWEL ELIMINATION Goal: RH STG MANAGE BOWEL W/MEDICATION W/ASSISTANCE STG Manage Bowel with Medication with Min.Assistance.  Outcome: Not Progressing LBM 7/21

## 2015-10-06 ENCOUNTER — Inpatient Hospital Stay (HOSPITAL_COMMUNITY): Payer: Medicaid Other | Admitting: Occupational Therapy

## 2015-10-06 ENCOUNTER — Inpatient Hospital Stay (HOSPITAL_COMMUNITY): Payer: Medicaid Other | Admitting: Speech Pathology

## 2015-10-06 ENCOUNTER — Inpatient Hospital Stay (HOSPITAL_COMMUNITY): Payer: Medicaid Other | Admitting: Physical Therapy

## 2015-10-06 NOTE — Progress Notes (Signed)
Occupational Therapy Session Note  Patient Details  Name: [name] MRN: [id number] Date of Birth: Oct 29, 1955  Today's Date: 10/06/2015 OT Individual Time: 0800-0900 OT Individual Time Calculation (min): 60 min     Short Term Goals: Week 1:  OT Short Term Goal 1 (Week 1): Pt will complete LB bathing sit to stand with mod assist. OT Short Term Goal 2 (Week 1): Pt will complete LB dressing sit to stand with mod assist.  OT Short Term Goal 3 (Week 1): Pt will complete toilet transfers with min assist using LRAD. OT Short Term Goal 4 (Week 1): Pt will perform shower transfers with min assist to walk-in shower. OT Short Term Goal 5 (Week 1): Pt will use the LUE as a gross assist for bathing and dressing tasks with min assist.   Skilled Therapeutic Interventions/Progress Updates:    Pt completed bathing and dressing during session.  Mod assist for transfers to the tub bench and wheelchair stand pivot.  She was able to integrate the LUE as an active assist for bathing tasks with supervision for washing RUE and leg with increased time.  Supervision for all bathing in sitting with lateral leans for washing peri area in sitting.  Increased leaning/pushing to the left side in sitting and in standing with decreased activation of the LLE in standing, requiring max facilitation to achieve and maintain full knee extension.  Pt completed dressing in sit to stand following hemi techniques.  She was able to tie the left shoe but not the right.   Pt left in wheelchair with safety belt and call button in reach.   Therapy Documentation Precautions:  Precautions Precautions: Fall Precaution Comments: left hemiparesis Restrictions Weight Bearing Restrictions: No    Pain: Pain Assessment Pain Assessment: 0-10 Pain Score: 6  Pain Type: Chronic pain Pain Location: Back Pain Orientation: Lower Pain Radiating Towards: left hip Pain Descriptors / Indicators: Aching Pain Onset: On-going Patients Stated  Pain Goal: 3 Pain Intervention(s): Other (Comment) (patient pre-medicated prior to session ) Multiple Pain Sites: No ADL:  See Function Navigator for Current Functional Status.   Therapy/Group: Individual Therapy  Getsemani Lindon OTR/L7/25/2017, 12:19 PM

## 2015-10-06 NOTE — Progress Notes (Signed)
Speech Language Pathology Daily Session Note  Patient Details  Name: [name] MRN: [id number] Date of Birth: 12-04-1955  Today's Date: 10/06/2015 SLP Individual Time: 1030-1130 SLP Individual Time Calculation (min): 60 min   Short Term Goals: Week 1: SLP Short Term Goal 1 (Week 1): Pt will utilize speech intelligibility strategies at the sentence level with Min A verbal cues to achieve 100% intelligibility.  SLP Short Term Goal 2 (Week 1): Pt will demonstrate functional problem solving for mildly complex tasks with Min A verbal cues.  SLP Short Term Goal 3 (Week 1): Pt will utilize external memory aids to recall new, daily information with Min A verbal and quetion cues.  SLP Short Term Goal 4 (Week 1): Pt will demonstrate selective attention to functional tasks in a moderatey distracting environment for 30 with Min A verbal cues for redirection.  SLP Short Term Goal 5 (Week 1): Pt will demonstrate anticipatory awareness and identify 3 tasks she can participate in at home safely with Min A verbal cues.   Skilled Therapeutic Interventions:   Skilled treatment session focused on addressing cognition goals. SLP facilitated session by providing Supervision level verbal cues to problem solve a basic money management task.  Patient with baseline visual deficits and was able to request help as needed due to these deficits.  Patient was able to acknowledge current deficits and anticipate safe and unsafe tasks for home management upon discharge with Min assist question cues.  Patient also able to verbally direct SLP/navigate route back to with Supervision level question cues.  Attention did not appear to impact performance this session will continue to monitor in more distracting environments.  Continue with current plan of care.    Function:  Cognition Comprehension Comprehension assist level: Follows basic conversation/direction with extra time/assistive device  Expression   Expression assist  level: Expresses basic 75 - 89% of the time/requires cueing 10 - 24% of the time. Needs helper to occlude trach/needs to repeat words.  Social Interaction Social Interaction assist level: Interacts appropriately 90% of the time - Needs monitoring or encouragement for participation or interaction.  Problem Solving Problem solving assist level: Solves basic 75 - 89% of the time/requires cueing 10 - 24% of the time  Memory Memory assist level: Recognizes or recalls 75 - 89% of the time/requires cueing 10 - 24% of the time    Pain Pain Assessment Pain Assessment: 0-10 Pain Score: 6  Pain Type: Chronic pain Pain Location: Back Pain Orientation: Lower Pain Radiating Towards: left hip Pain Descriptors / Indicators: Aching Pain Onset: On-going Patients Stated Pain Goal: 3 Pain Intervention(s): Other (Comment) (patient pre-medicated prior to session ) Multiple Pain Sites: No  Therapy/Group: Individual Therapy  Charlane Ferretti., CCC-SLP [phone]  [name] 10/06/2015, 10:54 AM

## 2015-10-06 NOTE — Progress Notes (Signed)
60 y.o. right handed female with history of chronic back pain, tobacco and alcohol abuse, PTSD, GI bleed. Per chart review patient lives with nieces. Two-level home with bed and bath upstairs.Reported to use a single-point cane prior to admission.  Presented 09/26/2015 with acute onset of left-sided weakness. Cranial CT scan negative. 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 Multiple remote lacunar infarcts involving the bilateral external capsules/basal ganglia and the right paramedian pons Subjective/Complaints: Left hip pain, mainly with side lying Concerned about "fungus between toes on R side" ROS- no N/V/D, No CP, SOB  Objective: Vital Signs: Blood pressure (!) 112/56, pulse 75, temperature 97.8 F (36.6 C), temperature source Oral, resp. rate 18, SpO2 98 %. No results found. Results for orders placed or performed during the hospital encounter of 10/01/15 (from the past 72 hour(s))  Urinalysis, Routine w reflex microscopic (not at [facility])     Status: None   Collection Time: 10/03/15  7:50 PM  Result Value Ref Range   Color, Urine YELLOW YELLOW   APPearance CLEAR CLEAR   Specific Gravity, Urine 1.006 1.005 - 1.030   pH 6.5 5.0 - 8.0   Glucose, UA NEGATIVE NEGATIVE mg/dL   Hgb urine dipstick NEGATIVE NEGATIVE   Bilirubin Urine NEGATIVE NEGATIVE   Ketones, ur NEGATIVE NEGATIVE mg/dL   Protein, ur NEGATIVE NEGATIVE mg/dL   Nitrite NEGATIVE NEGATIVE   Leukocytes, UA NEGATIVE NEGATIVE    Comment: MICROSCOPIC NOT DONE ON URINES WITH NEGATIVE PROTEIN, BLOOD, LEUKOCYTES, NITRITE, OR GLUCOSE <1000 mg/dL.  Urine culture     Status: Abnormal   Collection Time: 10/03/15  7:50 PM  Result Value Ref Range   Specimen Description URINE, CLEAN CATCH    Special Requests NONE    Culture MULTIPLE SPECIES PRESENT, SUGGEST RECOLLECTION (A)    Report Status 10/04/2015 FINAL      HEENT: normal Cardio: RRR and no murmur Resp: CTA B/L  and unlabored GI: BS positive and NT,ND Extremity:  Pulses positive and No Edema Skin:  Small scab and hyperpigmentation between toes on R foot digits 4 and 5 Neuro: Abnormal Sensory decreased LT toes of L foot, Abnormal Motor 5/5 in RUE and RLE, 3- L delt bi tri grip HF KE ADF and Abnormal FMC Ataxic/ dec FMC Musc/Skel:  Other mild tenderness over L hip greater troch and surrounding area, no pain with L hip ROM Gen NAD   Assessment/Plan: 1. Functional deficits secondary to Left hemiparesis which require 3+ hours per day of interdisciplinary therapy in a comprehensive inpatient rehab setting. Physiatrist is providing close team supervision and 24 hour management of active medical problems listed below. Physiatrist and rehab team continue to assess barriers to discharge/monitor patient progress toward functional and medical goals. FIM: Function - Bathing Position: Shower Body parts bathed by patient: Right arm, Left arm, Chest, Abdomen, Front perineal area, Right upper leg, Left upper leg, Right lower leg, Left lower leg Body parts bathed by helper: Buttocks, Back Assist Level: Touching or steadying assistance(Pt > 75%)  Function- Upper Body Dressing/Undressing What is the patient wearing?: Bra, Pull over shirt/dress Bra - Perfomed by patient: Thread/unthread right bra strap, Thread/unthread left bra strap Bra - Perfomed by helper: Hook/unhook bra (pull down sports bra) Pull over shirt/dress - Perfomed by patient: Thread/unthread right sleeve, Put head through opening, Pull shirt over trunk Pull over shirt/dress - Perfomed by helper: Thread/unthread left sleeve Assist Level: Touching or steadying assistance(Pt > 75%) Function - Lower Body  Dressing/Undressing What is the patient wearing?: Pants, Shoes, Ted Hose Position: Wheelchair/chair at [employer]- Performed by patient: Thread/unthread right pants leg, Thread/unthread left pants leg Pants- Performed by helper: Pull pants  up/down Non-skid slipper socks- Performed by patient: Don/doff right sock Non-skid slipper socks- Performed by helper: Don/doff left sock Shoes - Performed by patient: Don/doff right shoe Shoes - Performed by helper: Don/doff right shoe, Don/doff left shoe, Fasten right, Fasten left TED Hose - Performed by helper: Don/doff right TED hose, Don/doff left TED hose Assist for footwear: Maximal assist Assist for lower body dressing: Touching or steadying assistance (Pt > 75%)  Function - Toileting Toileting steps completed by patient: Performs perineal hygiene Toileting steps completed by helper: Adjust clothing prior to toileting, Adjust clothing after toileting Toileting Assistive Devices: Grab bar or rail Assist level: Two helpers  Function - Archivist transfer assistive device: Bedside commode Assist level to toilet: 2 helpers Assist level from toilet: 2 helpers Assist level to bedside commode (at bedside): Moderate assist (Pt 50 - 74%/lift or lower) Assist level from bedside commode (at bedside): Moderate assist (Pt 50 - 74%/lift or lower)  Function - Chair/bed transfer Chair/bed transfer method: Stand pivot Chair/bed transfer assist level: Moderate assist (Pt 50 - 74%/lift or lower) Chair/bed transfer assistive device: Armrests, Bedrails Chair/bed transfer details: Manual facilitation for weight shifting, Manual facilitation for placement  Function - Locomotion: Wheelchair Will patient use wheelchair at discharge?: Yes Type: Manual Max wheelchair distance: 100 Assist Level: Supervision or verbal cues Wheel 50 feet with 2 turns activity did not occur: Safety/medical concerns Assist Level: Supervision or verbal cues Wheel 150 feet activity did not occur: Safety/medical concerns Turns around,maneuvers to table,bed, and toilet,negotiates 3% grade,maneuvers on rugs and over doorsills: No Function - Locomotion: Ambulation Max distance: 15 Assist level: 2  helpers Assist level: 2 helpers Walk 10 feet on uneven surfaces activity did not occur: Safety/medical concerns  Function - Comprehension Comprehension: Auditory Comprehension assist level: Follows complex conversation/direction with extra time/assistive device  Function - Expression Expression: Verbal Expression assist level: Expresses basic needs/ideas: With no assist  Function - Social Interaction Social Interaction assist level: Interacts appropriately 90% of the time - Needs monitoring or encouragement for participation or interaction.  Function - Problem Solving Problem solving assist level: Solves basic problems with no assist  Function - Memory Memory assist level: Recognizes or recalls 75 - 89% of the time/requires cueing 10 - 24% of the time Patient normally able to recall (first 3 days only): Current season Medical Problem List and Plan: 1.  Left side weakness secondary to nonhemorrhagic infarct posterior right corona radiata- Cont CIR PT, OT, SLP 2.  DVT Prophylaxis/Anticoagulation: Subcutaneous Lovenox. Monitor platelet counts in signs of bleeding 3. Pain Management/chronic back pain. 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 Mood/PTSD. Zoloft 50 mg daily, Requip 0.25 mg 3 times a day 5. Neuropsych: This patient is capable of making decisions on her own behalf. 6. Skin/Wound Care: Routine skin checks, prob tinea pedis R foot start clotrimazole BID 7. Fluids/Electrolytes/Nutrition: Routine I&O's follow-up chemistries 8. Tobacco alcohol abuse. Counseling. Consider Nicoderm patch 9. History of GI bleed. Continue Pepcid twice a day. Follow-up CBC 10.  Hx Alcohol abuse  denies ETOH for ~53mo, LFTs ok 11. 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  Constipation improved  senna S  LOS (Days) 5 A FACE TO FACE EVALUATION WAS PERFORMED  KIRSTEINS,ANDREW E 10/06/2015, 8:02 AM

## 2015-10-06 NOTE — Progress Notes (Signed)
Physical Therapy Note  Patient Details  Name: [name] MRN: [id number] Date of Birth: [birth year] Today's Date: 10/06/2015    Time: 1345-1455 70 minutes  1:1 Pt c/o pain in Lt hip and side, RN aware, k-pad after treatment.  W/c mobility with hemi technique initially min A, able to be supervision 25' before requiring min A due to fatigue.  Transfer training with squat pivot to various surfaces with mod A to Rt, max A Lt. Cues required for UE and LE placement, manual facilitation for wt shifts.  Standing frame for wt bearing through Lt LE with reaching tasks with B UE,wt shifts. Pt able to stand x 15 minutes without c/o increased Lt hip pain.  Supine NMR with PNF diagonals, bridging, LTR, add squeeze with tactile cues to activate glutes, core and hamstrings. Pt fatigues very quickly due to decreased mm endurance. Pt motivated to improve, limited by decreased mm strength and endurance.   DONAWERTH,KAREN 10/06/2015, 2:56 PM

## 2015-10-07 ENCOUNTER — Inpatient Hospital Stay (HOSPITAL_COMMUNITY): Payer: Medicaid Other | Admitting: Speech Pathology

## 2015-10-07 ENCOUNTER — Inpatient Hospital Stay (HOSPITAL_COMMUNITY): Payer: Medicaid Other | Admitting: Occupational Therapy

## 2015-10-07 ENCOUNTER — Inpatient Hospital Stay (HOSPITAL_COMMUNITY): Payer: Medicaid Other

## 2015-10-07 MED ORDER — OXYBUTYNIN CHLORIDE 5 MG PO TABS
5.0000 mg | ORAL_TABLET | Freq: Every day | ORAL | Status: DC
  Administered 2015-10-07 – 2015-10-12 (×6): 5 mg via ORAL
  Filled 2015-10-07 (×7): qty 1

## 2015-10-07 MED ORDER — TRAMADOL-ACETAMINOPHEN 37.5-325 MG PO TABS
1.0000 | ORAL_TABLET | ORAL | Status: DC | PRN
  Administered 2015-10-07 – 2015-10-24 (×23): 1 via ORAL
  Filled 2015-10-07 (×24): qty 1

## 2015-10-07 MED ORDER — OXYCODONE-ACETAMINOPHEN 7.5-325 MG PO TABS
1.0000 | ORAL_TABLET | Freq: Three times a day (TID) | ORAL | Status: DC | PRN
  Administered 2015-10-09 – 2015-10-19 (×22): 1 via ORAL
  Filled 2015-10-07 (×22): qty 1

## 2015-10-07 NOTE — Progress Notes (Addendum)
Physical Therapy Note  Patient Details  Name: [name] MRN: [id number] Date of Birth: 08/24/1955 Today's Date: 10/07/2015  0800-0905, 65 min individual tx Pain:7/10 L lateral hip, premdicated  neuromuscular re-education via manual and VCs, forced use for: bil bridging, lower trunk rotation in supine, and bil hip/L hip flex/ext in R sidelying.  Also, passive stretching L heel cord in sidelying, reciprocal scooting for pelvic dissociation in sitting; R lateral leans for L hip focus, and biased to R standing to address midline orientation and facilitate trunk righting.  ( pt overshifts to L, "hangs" on her L hip, exacerbating L lateral hip pain. )Sit <> stand from bed without rails x 3, limited by overshifting to L and poor L LE stance stability. Squat pivot bed> w/c to L with mod assist. Toilet transfer mod/max assist for continent B and B. Pt left resting in w/c with quick release belt applied and all needs within reach.   Josceline Chenard 10/07/2015, 7:48 AM

## 2015-10-07 NOTE — Progress Notes (Signed)
Occupational Therapy Session Note  Patient Details  Name: [name] MRN: [id number] Date of Birth: February 11, 1956  Today's Date: 10/07/2015 OT Individual Time: 1300-1347 OT Individual Time Calculation (min): 47 min     Short Term Goals: Week 1:  OT Short Term Goal 1 (Week 1): Pt will complete LB bathing sit to stand with mod assist. OT Short Term Goal 2 (Week 1): Pt will complete LB dressing sit to stand with mod assist.  OT Short Term Goal 3 (Week 1): Pt will complete toilet transfers with min assist using LRAD. OT Short Term Goal 4 (Week 1): Pt will perform shower transfers with min assist to walk-in shower. OT Short Term Goal 5 (Week 1): Pt will use the LUE as a gross assist for bathing and dressing tasks with min assist.   Skilled Therapeutic Interventions/Progress Updates:    Pt completed bathing and dressing during session.  Still with increased pushing to the left in standing and with functional transfers.  Increased weightbearing over the left hip with left trunk elongation in sitting.  Emphasized the need to work on better midline trunk control during bathing tasks as pt can sit unsupported with supervision for bathing.  Mod demonstrational cueing needed throughout session to increase weightbearing through the right hip.  She is able to use the LUE throughout bathing with supervision but needs mod assist for standing balance when attempting to wash peri area.  Still needs min instructional cueing for hemi dressing techniques with mod assist as well for standing balance to complete LB dressing tasks.  Pt left in wheelchair with call button in place and safety belt as well.     Therapy Documentation Precautions:  Precautions Precautions: Fall Precaution Comments: left hemiparesis, pusher to the left Restrictions Weight Bearing Restrictions: No  Pain: Pain Assessment Pain Assessment: No/denies pain Pain Score: 5  Pain Type: Chronic pain Pain Location: Back Pain Intervention(s):  Medication (See eMAR) ADL: See Function Navigator for Current Functional Status.   Therapy/Group: Individual Therapy  Kearie Mennen OTR/L 10/07/2015, 4:44 PM

## 2015-10-07 NOTE — Progress Notes (Signed)
Social Work Patient ID: [name], female   DOB: April 09, 1955, 60 y.o.   MRN: [id number]   Met with pt and spoke with niece-[name] via telephone to discuss team conference goals-supervision/min assist level and she will need 24 hr physical care at discharge from rehab. Niece reports she has a 8 hr per day CAP worker, who will be assisting them. Discussed her coming in prior to discharge to learn pt's care, she will. [employer] discharge date is 8/10. Both agreeable to this plan. Niece informed MD made aware of her concerns regarding using narcotics for pt's back pain.

## 2015-10-07 NOTE — Progress Notes (Signed)
Speech Language Pathology Daily Session Note  Patient Details  Name: [name] MRN: [id number] Date of Birth: [birth year]  Today's Date: 10/07/2015 SLP Individual Time: 1430-1515 SLP Individual Time Calculation (min): 45 min   Short Term Goals: Week 1: SLP Short Term Goal 1 (Week 1): Pt will utilize speech intelligibility strategies at the sentence level with Min A verbal cues to achieve 100% intelligibility.  SLP Short Term Goal 2 (Week 1): Pt will demonstrate functional problem solving for mildly complex tasks with Min A verbal cues.  SLP Short Term Goal 3 (Week 1): Pt will utilize external memory aids to recall new, daily information with Min A verbal and quetion cues.  SLP Short Term Goal 4 (Week 1): Pt will demonstrate selective attention to functional tasks in a moderatey distracting environment for 30 with Min A verbal cues for redirection.  SLP Short Term Goal 5 (Week 1): Pt will demonstrate anticipatory awareness and identify 3 tasks she can participate in at home safely with Min A verbal cues.   Skilled Therapeutic Interventions:Pt seen for skilled treatment targeting cognitive skills. Therapy was conducted in a semi-distracting environment, however pt able to attend to tasks without assist. Pt able to count money with 90% acc mod I for increased time and self-correction. Basic +/- completed with mod A due to frequent confusion related to denomination. 90% at mod I for 20 minutes sustained attention to a task requiring working memory of 3 items.    Function:  Eating Eating     Eating Assist Level: Set up assist for   Eating Set Up Assist For: Opening containers;Cutting food       Cognition Comprehension Comprehension assist level: Understands basic 90% of the time/cues < 10% of the time  Expression   Expression assist level: Expresses basic needs/ideas: With extra time/assistive device  Social Interaction Social Interaction assist level: Interacts appropriately 90% of the  time - Needs monitoring or encouragement for participation or interaction.  Problem Solving Problem solving assist level: Solves basic 50 - 74% of the time/requires cueing 25 - 49% of the time  Memory Memory assist level: Recognizes or recalls 50 - 74% of the time/requires cueing 25 - 49% of the time    Pain Pain Assessment Pain Assessment: No/denies pain   Therapy/Group: Individual Therapy  Rocky Crafts MA, CCC-SLP 10/07/2015, 4:27 PM

## 2015-10-07 NOTE — Patient Care Conference (Signed)
Inpatient RehabilitationTeam Conference and Plan of Care Update Date: 10/07/2015   Time: 11:00 AM    Patient Name: Antonella Cerra      Medical Record Number: [id number]  Date of Birth: May 19, 1955 Sex: Female         Room/Bed: 4M13C/4M13C-01 Payor Info: Payor: MEDICAID [place] / Plan: MEDICAID [place] ACCESS / Product Type: *No Product type* /    Admitting Diagnosis: Stroke   Admit Date/Time:  10/01/2015  5:35 PM Admission Comments: No comment available   Primary Diagnosis:  Left-sided weakness Principal Problem: Left-sided weakness  Patient Active Problem List   Diagnosis Date Noted  . Chronic back pain   . ETOH abuse   . Hypokalemia   . PTSD (post-traumatic stress disorder)   . Gastroesophageal reflux disease without esophagitis   . Acute ischemic stroke (HCC) 09/27/2015  . Acute CVA (cerebrovascular accident) (HCC) 09/27/2015  . Tobacco abuse   . Left-sided weakness 09/26/2015  . Acute gastric ulcer   . GI bleed 05/22/2014  . Acute GI bleeding 05/22/2014  . Acute blood loss anemia 05/22/2014  . History of CVA (cerebrovascular accident) 05/22/2014  . Hyperlipidemia 05/22/2014  . Gastrointestinal hemorrhage with melena   . CVA (cerebral infarction) 10/09/2012  . Flank pain 10/09/2012  . Right pontine stroke (HCC) 10/09/2012  . ABDOMINAL PAIN OTHER SPECIFIED SITE 05/17/2010  . ALCOHOL ABUSE 06/16/2007  . COCAINE ABUSE 06/16/2007  . DEPRESSION 06/16/2007  . Essential hypertension 06/16/2007  . CEREBROVASCULAR ACCIDENT 06/16/2007  . ASTHMA 06/16/2007  . GERD 06/16/2007  . PANCREATITIS 11/09/2006  . ESOPHAGITIS, REFLUX 05/23/2006    Expected Discharge Date: Expected Discharge Date: 10/22/15  Team Members Present: Physician leading conference: Dr. Claudette Laws Social Worker Present: Dossie Der, LCSW Nurse Present: Carmie End, RN PT Present: Wanda Plump, PT OT Present: Perrin Maltese, OT SLP Present: Claudell Kyle, SLP PPS Coordinator present : Tora Duck, RN,  CRRN     Current Status/Progress Goal Weekly Team Focus  Medical   Chronic low back pain, left gluteus medius syndrome, mild lethargy  Address orthopedic as well as neurologic issues during inpatient stay  Work on hip pain, left gluteus stretching and strengthening   Bowel/Bladder   Contient with bowel/bladder but can have some episode of bladder incontient. LBM 09/26/15  Patient to be continent of bowel/bladder while in [facility].  monitor bowel/bladder function. Timed toilect patient q 2hrs   Swallow/Nutrition/ Hydration             ADL's   Supervision for UB selfcare, mod assist for LB selfcare sit to stand,  Mod assist for stand pivot transfers to the shower and toilet.  Brunnstrum stage V in the arm and hand, uses with selfcare tasks with increased time.  Decreased FM control for tying shoes consistently.  Pusher syndrome in sitting and standing to the left side.  supervision to min assist   selfcare re-training, neuromuscular re-education, transfer training, balance re-training, pt/family education   Mobility   mod A transfers, +2 gait  supervision transfers, min A gait  NMR, transfers   Communication             Safety/Cognition/ Behavioral Observations  min- mod A  mod I   problem solving/memory compensatory strategies   Pain   Complained of pain of 9/10. percocet 7.5-325mg  1 tab given  <3  Assess and treat pain q shift and as needed   Skin   Fungi betwen R pinky toe.  Skin to be free from new breakdown/infection with mod  assist  Assess skin q shift and as needed.      *See Care Plan and progress notes for long and short-term goals.  Barriers to Discharge: See above    Possible Resolutions to Barriers:  See above    Discharge Planning/Teaching Needs:  HOme with niece who can provide assist and CAP worker, will need to get niece in for education soon      Team Discussion:  Goals min to supervision level-pushes to the left her affected side. MD ordered medication for her foot  and back. Niece concerned about using narcotics. Family made aware she will require 24 hr care. Currently plus 2 assist.  Revisions to Treatment Plan:  None   Continued Need for Acute Rehabilitation Level of Care: The patient requires daily medical management by a physician with specialized training in physical medicine and rehabilitation for the following conditions: Daily direction of a multidisciplinary physical rehabilitation program to ensure safe treatment while eliciting the highest outcome that is of practical value to the patient.: Yes Daily medical management of patient stability for increased activity during participation in an intensive rehabilitation regime.: Yes Daily analysis of laboratory values and/or radiology reports with any subsequent need for medication adjustment of medical intervention for : Neurological problems;Other  Lucy Chris 10/07/2015, 3:38 PM

## 2015-10-07 NOTE — Progress Notes (Signed)
Occupational Therapy Session Note  Patient Details  Name: [name] MRN: [id number] Date of Birth: 1955-03-22  Today's Date: 10/07/2015 OT Individual Time: 7425-9563 OT Individual Time Calculation (min): 39 min     Short Term Goals: Week 1:  OT Short Term Goal 1 (Week 1): Pt will complete LB bathing sit to stand with mod assist. OT Short Term Goal 2 (Week 1): Pt will complete LB dressing sit to stand with mod assist.  OT Short Term Goal 3 (Week 1): Pt will complete toilet transfers with min assist using LRAD. OT Short Term Goal 4 (Week 1): Pt will perform shower transfers with min assist to walk-in shower. OT Short Term Goal 5 (Week 1): Pt will use the LUE as a gross assist for bathing and dressing tasks with min assist.   Skilled Therapeutic Interventions/Progress Updates:    Pt completed neuromuscular re-education for trunk and LUE during session.  Transferred to bed from wheelchair with mod assist stand pivot.  Increased pushing to the left side in sitting and in standing with decreased ability to weightshift over the non-affected LE and hip.  Worked on lateral weightshifts to the right in sitting for reciprical scooting.  Pt demonstrates decreased motor planning for relaxing the right side and activating the left side.  Integrated use of the LUE to assist with transitions from sidelying to sitting on the left side with mod facilitation to avoid over using the right trunk.  Pt left in room at end of session with safety belt in place.      Therapy Documentation Precautions:  Precautions Precautions: Fall Precaution Comments: left hemiparesis Restrictions Weight Bearing Restrictions: No  Pain: Pain Assessment Pain Assessment: No/denies pain Pain Score: 7  Pain Type: Chronic pain Pain Location: Back Pain Orientation: Lower Pain Radiating Towards: left hip Pain Descriptors / Indicators: Constant;Aching;Discomfort Pain Frequency: Constant Pain Onset: On-going Patients Stated  Pain Goal: 4 Pain Intervention(s): Medication (See eMAR) ADL: See Function Navigator for Current Functional Status.   Therapy/Group: Individual Therapy  Larance Ratledge OTR/L  10/07/2015, 10:46 AM

## 2015-10-07 NOTE — Progress Notes (Signed)
Patient Niece called and told RN she doesn't her Aunt wearing diaper. RN educated her about the reason why patient is wearing diaper and how we are time toileting patient so that she can be continent again and the Niece verbalizes understanding of the teaching.

## 2015-10-07 NOTE — Progress Notes (Signed)
60 y.o. right handed female with history of chronic back pain, tobacco and alcohol abuse, PTSD, GI bleed. Per chart review patient lives with nieces. Two-level home with bed and bath upstairs.Reported to use a single-point cane prior to admission.  Presented 09/26/2015 with acute onset of left-sided weakness. Cranial CT scan negative. MRI showed a 13m acute ischemic nonhemorrhagic infarct involving the posterior right corona radiata. Multiple remote lacunar infarcts involving the bilateral external capsules/basal ganglia and the right paramedian pons Subjective/Complaints: Left hip pain, mainly with side lying Concerned about "fungus between toes on R side" ROS- no N/V/D, No CP, SOB  Objective: Vital Signs: Blood pressure (!) 109/54, pulse 64, temperature 98.1 F (36.7 C), temperature source Oral, resp. rate 18, weight 67.4 kg (148 lb 11.2 oz), SpO2 97 %. No results found. No results found for this or any previous visit (from the past 72 hour(s)).   HEENT: normal Cardio: RRR and no murmur Resp: CTA B/L and unlabored GI: BS positive and NT,ND Extremity:  Pulses positive and No Edema Skin:  Small scab and hyperpigmentation between toes on R foot digits 4 and 5 Neuro: Abnormal Sensory decreased LT toes of L foot, Abnormal Motor 5/5 in RUE and RLE, 3- L delt bi tri grip HF KE ADF and Abnormal FMC Ataxic/ dec FMC Musc/Skel:  Other mild tenderness over L hip greater troch and surrounding area, no pain with L hip ROM Gen NAD   Assessment/Plan: 1. Functional deficits secondary to Left hemiparesis which require 3+ hours per day of interdisciplinary therapy in a comprehensive inpatient rehab setting. Physiatrist is providing close team supervision and 24 hour management of active medical problems listed below. Physiatrist and rehab team continue to assess barriers to discharge/monitor patient progress toward functional and medical goals. FIM: Function - Bathing Position: Shower Body parts  bathed by patient: Right arm, Left arm, Chest, Abdomen, Front perineal area, Right upper leg, Left upper leg, Right lower leg, Left lower leg, Buttocks (completed bathing in sitting with lateral lean) Body parts bathed by helper: Buttocks, Back Bathing not applicable: Back Assist Level: Touching or steadying assistance(Pt > 75%)  Function- Upper Body Dressing/Undressing What is the patient wearing?: Pull over shirt/dress Bra - Perfomed by patient: Thread/unthread right bra strap, Thread/unthread left bra strap Bra - Perfomed by helper: Hook/unhook bra (pull down sports bra) Pull over shirt/dress - Perfomed by patient: Thread/unthread right sleeve, Thread/unthread left sleeve, Put head through opening, Pull shirt over trunk Pull over shirt/dress - Perfomed by helper: Thread/unthread left sleeve Assist Level: Set up Function - Lower Body Dressing/Undressing What is the patient wearing?: Pants, Shoes, Ted Hose Position: Wheelchair/chair at [employer] Performed by patient: Thread/unthread right pants leg, Thread/unthread left pants leg Pants- Performed by helper: Pull pants up/down Non-skid slipper socks- Performed by patient: Don/doff right sock, Don/doff left sock Non-skid slipper socks- Performed by helper: Don/doff left sock Shoes - Performed by patient: Don/doff right shoe, Don/doff left shoe, Fasten left Shoes - Performed by helper: Fasten right TED Hose - Performed by helper: Don/doff right TED hose, Don/doff left TED hose Assist for footwear: Maximal assist Assist for lower body dressing: Touching or steadying assistance (Pt > 75%)  Function - Toileting Toileting steps completed by patient: Performs perineal hygiene Toileting steps completed by helper: Adjust clothing prior to toileting, Adjust clothing after toileting Toileting Assistive Devices: Grab bar or rail Assist level: Touching or steadying assistance (Pt.75%)  Function - Toilet Transfers Toilet transfer assistive  device: Elevated toilet seat/BSC over toilet,  Grab bar Assist level to toilet: 2 helpers Assist level from toilet: 2 helpers Assist level to bedside commode (at bedside): Moderate assist (Pt 50 - 74%/lift or lower) Assist level from bedside commode (at bedside): Moderate assist (Pt 50 - 74%/lift or lower)  Function - Chair/bed transfer Chair/bed transfer method: Stand pivot Chair/bed transfer assist level: Moderate assist (Pt 50 - 74%/lift or lower) Chair/bed transfer assistive device: Armrests Chair/bed transfer details: Manual facilitation for weight shifting, Manual facilitation for placement  Function - Locomotion: Wheelchair Will patient use wheelchair at discharge?: Yes Type: Manual Max wheelchair distance: 100 Assist Level: Supervision or verbal cues Wheel 50 feet with 2 turns activity did not occur: Safety/medical concerns Assist Level: Supervision or verbal cues Wheel 150 feet activity did not occur: Safety/medical concerns Turns around,maneuvers to table,bed, and toilet,negotiates 3% grade,maneuvers on rugs and over doorsills: No Function - Locomotion: Ambulation Max distance: 15 Assist level: 2 helpers Assist level: 2 helpers Walk 10 feet on uneven surfaces activity did not occur: Safety/medical concerns  Function - Comprehension Comprehension: Auditory Comprehension assist level: Follows basic conversation/direction with no assist  Function - Expression Expression: Verbal Expression assist level: Expresses basic needs/ideas: With no assist  Function - Social Interaction Social Interaction assist level: Interacts appropriately 90% of the time - Needs monitoring or encouragement for participation or interaction.  Function - Problem Solving Problem solving assist level: Solves complex 90% of the time/cues < 10% of the time  Function - Memory Memory assist level: Recognizes or recalls 90% of the time/requires cueing < 10% of the time Patient normally able to recall  (first 3 days only): Current season Medical Problem List and Plan: 1.  Left side weakness secondary to nonhemorrhagic infarct posterior right corona radiata- Team conference today please see physician documentation under team conference tab, met with team face-to-face to discuss problems,progress, and goals. Formulized individual treatment plan based on medical history, underlying problem and comorbidities. 2.  DVT Prophylaxis/Anticoagulation: Subcutaneous Lovenox. Monitor platelet counts in signs of bleeding 3. Pain Management/chronic back pain. 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 Mood/PTSD. Zoloft 50 mg daily, Requip 0.25 mg 3 times a day 5. Neuropsych: This patient is capable of making decisions on her own behalf. 6. Skin/Wound Care: Routine skin checks, prob tinea pedis R foot start clotrimazole BID 7. Fluids/Electrolytes/Nutrition: Routine I&O's follow-up chemistries 8. Tobacco alcohol abuse. Counseling. No nicotine craving reported 9. History of GI bleed. Continue Pepcid twice a day. Follow-up CBC 10.  Hx Alcohol abuse denies ETOH for ~105mo LFTs ok 11. 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  Constipation improved  senna S  LOS (Days) 6 A FACE TO FACE EVALUATION WAS PERFORMED  [name] E 10/07/2015, 8:21 AM

## 2015-10-08 ENCOUNTER — Inpatient Hospital Stay (HOSPITAL_COMMUNITY): Payer: Medicaid Other | Admitting: Occupational Therapy

## 2015-10-08 ENCOUNTER — Inpatient Hospital Stay (HOSPITAL_COMMUNITY): Payer: Medicaid Other | Admitting: Speech Pathology

## 2015-10-08 ENCOUNTER — Inpatient Hospital Stay (HOSPITAL_COMMUNITY): Payer: Medicaid Other

## 2015-10-08 DIAGNOSIS — I6350 Cerebral infarction due to unspecified occlusion or stenosis of unspecified cerebral artery: Secondary | ICD-10-CM

## 2015-10-08 LAB — CREATININE, SERUM: CREATININE: 0.84 mg/dL (ref 0.44–1.00)

## 2015-10-08 NOTE — Progress Notes (Signed)
Occupational Therapy Weekly Progress Note  Patient Details  Name: [name] MRN: [id number] Date of Birth: October 28, 1955  Beginning of progress report period: October 02, 2015 End of progress report period: October 08, 2015  Today's Date: 10/08/2015 OT Individual Time: 1004-1103 OT Individual Time Calculation (min): 59 min     Patient has met 3 of 5 short term goals.  [name] continues to need mod assist for stand pivot transfers to the toilet and shower as well as for sit to stand.  Pusher tendencies are still present to the left side with all transitional movements and with dynamic sitting.  She continues to demonstrate left trunk elongation and right side shortening with min assist needed to correct initially but she cannot maintain equal trunk alignment during dynamic sitting or standing tasks.  Mod assist for sit to stand and standing balance when pulling pants over hips during toileting and dressing tasks.  She continues to increase active use in the LUE and can complete washing the RUE with supervision and increased time.  She still needs min assist to integrate it into tying the right shoe.   [name] demonstrates decreased ability to motor plan simple tasks such as reciprical scooting and tends to overuse the RUE and LUE to complete this task.  Feel she needs continued neuromuscular re-education for progression to min assist/supervision level ADL goals.  Will continue with current OT treatment plan  Patient continues to demonstrate the following deficits: decreased balance, decreased functional use of the LUE, decreased motor planning, decreased strength, and therefore will continue to benefit from skilled OT intervention to enhance overall performance with BADL.  Patient progressing toward long term goals..  Continue plan of care.  OT Short Term Goals Week 2:  OT Short Term Goal 1 (Week 2): Pt will complete toilet transfers with min assist using LRAD. OT Short Term Goal 2 (Week 2): Pt  will perform shower transfers with min assist to walk-in shower. OT Short Term Goal 3 (Week 2): Pt will complete stand pivot toilet transfers with min assist to the 3:1.   OT Short Term Goal 4 (Week 2): Pt will perform LB dressing with min assist sit to stand.  OT Short Term Goal 5 (Week 2): Pt will use the LUE as an active assist for pulling up pants and tying shoes with supervision.    Skilled Therapeutic Interventions/Progress Updates:    Pt worked on Brewing technologist for her trunk and the LUE during session.  Worked on reciprical scooting on the left side with mat elevated as pt tends to rely on using the RLE to push herself forward or backwards instead of isolating the left side.  Incorporated lateral weightshifts to the right with forward trunk flexion to work on active right trunk shortening.  Pt needing mod facilitation to complete while maintaining anterior pelvic tilt.  Integrated the LUE in weightbearing on the therapy mat to assist with lateral shifting and scooting but needs max assist to maintain it.  Mod assist for stand pivot transfers to and from the therapy mat with mod facilitation for trunk alignment over the RLE and for left knee control.  Applied theraband to left wheelchair wheel to assist with propelling.  Pt able to complete wheelchair mobility with increased time and supervision on the left side.   Pt left in wheelchair with call button and phone in reach and safety belt in place.  Small pieces of foam issued in order for pt to work on functional reach and  FM control.     Therapy Documentation Precautions:  Precautions Precautions: Fall Precaution Comments: left hemiparesis, pusher to the left Restrictions Weight Bearing Restrictions: No  Pain: Pain Assessment Pain Assessment: 0-10 Pain Score: 2  Pain Type: Chronic pain Pain Location: Back Pain Intervention(s): Repositioned ADL: See Function Navigator for Current Functional Status.   Therapy/Group:  Individual Therapy  Jojo Pehl OTR/L 10/08/2015, 4:12 PM

## 2015-10-08 NOTE — Progress Notes (Addendum)
Physical Therapy Note  Patient Details  Name: Lam Stiles MRN: [id number] Date of Birth: Jun 14, 1955 Today's Date: 10/08/2015  1430-1605, 95 min individual tx Pain:L hip, " a little bit", declined meds.  Toilet transfer for continent voiding. Pt switched into 16 x 16 w/c for better fit and decreased tendency to push to L with trunk.  Pt able to propel with hemi method x 50' with mod assist for steering.  neuromuscular re-education via forced use, demo, multi modal cues for alternating reciprocal movements x 4 extremities on NuStep at level 4, x 10 minutes, focusing on neutral L hip rotation and increased cervical and trunk rotation. LUE focus during W/c propulsion using bil UEs with L w/c rim wrapped for better grip, with mod assist x 20' Standing with bil UE support for L step/taps onto 4" high step, wearing ace wrap for foot drop.  Pt able to clear foot to place on step, but required mod assist to return to floor. In sitting, use of scooter board under L foot to facilitate L knee flex/extension. Therapeutic activities in sitting to address midline disorientation/pusher tendencies by reaching with R hand out of BOS to retrieve small figures to R, and reach to floor, biased to R, to retrieve cones.  Pt left resting in w/c with quick release belt applied and all needs within reach.  Dawood Spitler 10/08/2015, 7:57 AM

## 2015-10-08 NOTE — Progress Notes (Signed)
Speech Language Pathology Daily Session Note  Patient Details  Name: [name] MRN: [id number] Date of Birth: 1955-10-24  Today's Date: 10/08/2015 SLP Individual Time: 0900-1000 SLP Individual Time Calculation (min): 60 min   Short Term Goals: Week 1: SLP Short Term Goal 1 (Week 1): Pt will utilize speech intelligibility strategies at the sentence level with Min A verbal cues to achieve 100% intelligibility.  SLP Short Term Goal 2 (Week 1): Pt will demonstrate functional problem solving for mildly complex tasks with Min A verbal cues.  SLP Short Term Goal 3 (Week 1): Pt will utilize external memory aids to recall new, daily information with Min A verbal and quetion cues.  SLP Short Term Goal 4 (Week 1): Pt will demonstrate selective attention to functional tasks in a moderatey distracting environment for 30 with Min A verbal cues for redirection.  SLP Short Term Goal 5 (Week 1): Pt will demonstrate anticipatory awareness and identify 3 tasks she can participate in at home safely with Min A verbal cues.   Skilled Therapeutic Interventions:Pt seen for skilled treatment targeting cognitive skills. Pt was able to independently recall 2/3 details from therapy session yesterday. Therapy was conducted in a semi-distracting environment, however pt able to attend to tasks without assist. Pt required mod faded to min A for moderately complex reasoning task requiring planning and organization.  Pt able to attend for 45 minute at mod I. Pt unable to complete basic +/- when challenged to complete it simultaneously with a physical task.   Function:  Eating Eating     Eating Assist Level: Set up assist for   Eating Set Up Assist For: Opening containers;Cutting food       Cognition Comprehension Comprehension assist level: Understands basic 90% of the time/cues < 10% of the time  Expression   Expression assist level: Expresses complex 90% of the time/cues < 10% of the time  Social Interaction  Social Interaction assist level: Interacts appropriately 90% of the time - Needs monitoring or encouragement for participation or interaction.  Problem Solving Problem solving assist level: Solves basic 90% of the time/requires cueing < 10% of the time  Memory Memory assist level: Recognizes or recalls 75 - 89% of the time/requires cueing 10 - 24% of the time    Pain Pain Assessment Pain Assessment: No/denies pain   Therapy/Group: Individual Therapy  Rocky Crafts MA, CCC-SLP 10/08/2015, 11:52 AM

## 2015-10-08 NOTE — Progress Notes (Signed)
60 y.o. right handed female with history of chronic back pain, tobacco and alcohol abuse, PTSD, GI bleed. Per chart review patient lives with nieces. Two-level home with bed and bath upstairs.Reported to use a single-point cane prior to admission.  Presented 09/26/2015 with acute onset of left-sided weakness. Cranial CT scan negative. 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 Multiple remote lacunar infarcts involving the bilateral external capsules/basal ganglia and the right paramedian pons Subjective/Complaints: Left hip pain, mainly with side lying Concerned about "fungus between toes on R side" ROS- no N/V/D, No CP, SOB  Objective: Vital Signs: Blood pressure 111/69, pulse 82, temperature 98.5 F (36.9 C), temperature source Oral, resp. rate 20, weight 67.7 kg (149 lb 3.2 oz), SpO2 100 %. No results found. Results for orders placed or performed during the hospital encounter of 10/01/15 (from the past 72 hour(s))  Creatinine, serum     Status: None   Collection Time: 10/08/15  4:23 AM  Result Value Ref Range   Creatinine, Ser 0.84 0.44 - 1.00 mg/dL   GFR calc non Af Amer >60 >60 mL/min   GFR calc Af Amer >60 >60 mL/min    Comment: (NOTE) The eGFR has been calculated using the CKD EPI equation. This calculation has not been validated in all clinical situations. eGFR's persistently <60 mL/min signify possible Chronic Kidney Disease.      HEENT: normal Cardio: RRR and no murmur Resp: CTA B/L and unlabored GI: BS positive and NT,ND Extremity:  Pulses positive and No Edema Skin:  Small scab and hyperpigmentation between toes on R foot digits 4 and 5 Neuro: Abnormal Sensory decreased LT toes of L foot, Abnormal Motor 5/5 in RUE and RLE, 3- L delt bi tri grip HF KE ADF and Abnormal FMC Ataxic/ dec FMC Musc/Skel:  Other mild tenderness over L hip greater troch and surrounding area, no pain with L hip ROM Gen  NAD   Assessment/Plan: 1. Functional deficits secondary to Left hemiparesis which require 3+ hours per day of interdisciplinary therapy in a comprehensive inpatient rehab setting. Physiatrist is providing close team supervision and 24 hour management of active medical problems listed below. Physiatrist and rehab team continue to assess barriers to discharge/monitor patient progress toward functional and medical goals. FIM: Function - Bathing Position: Shower Body parts bathed by patient: Right arm, Left arm, Chest, Abdomen, Front perineal area, Right upper leg, Left upper leg, Right lower leg, Left lower leg Body parts bathed by helper: Back, Buttocks Bathing not applicable: Back Assist Level: Touching or steadying assistance(Pt > 75%)  Function- Upper Body Dressing/Undressing What is the patient wearing?: Pull over shirt/dress, Bra Bra - Perfomed by patient: Thread/unthread right bra strap, Thread/unthread left bra strap Bra - Perfomed by helper: Hook/unhook bra (pull down sports bra) Pull over shirt/dress - Perfomed by patient: Thread/unthread right sleeve, Thread/unthread left sleeve, Put head through opening, Pull shirt over trunk Pull over shirt/dress - Perfomed by helper: Thread/unthread left sleeve Assist Level: Set up Function - Lower Body Dressing/Undressing What is the patient wearing?: Underwear, Pants, TMaryln Manuel Socks Position: WEducation officer, museumat [employer]- Performed by patient: Thread/unthread right underwear leg, Thread/unthread left underwear leg Underwear - Performed by helper: Pull underwear up/down Pants- Performed by patient: Thread/unthread right pants leg, Thread/unthread left pants leg, Fasten/unfasten pants Pants- Performed by helper: Pull pants up/down Non-skid slipper socks- Performed by patient: Don/doff right sock, Don/doff left sock Non-skid slipper socks- Performed by helper: Don/doff right sock,  Don/doff left sock Shoes - Performed by patient:  Don/doff right shoe, Don/doff left shoe Shoes - Performed by helper: Fasten right TED Hose - Performed by helper: Don/doff right TED hose, Don/doff left TED hose Assist for footwear: Maximal assist Assist for lower body dressing: Touching or steadying assistance (Pt > 75%)  Function - Toileting Toileting steps completed by patient: Performs perineal hygiene Toileting steps completed by helper: Adjust clothing prior to toileting, Adjust clothing after toileting Toileting Assistive Devices: Grab bar or rail Assist level: Touching or steadying assistance (Pt.75%)  Function - Toilet Transfers Toilet transfer assistive device: Elevated toilet seat/BSC over toilet, Grab bar Assist level to toilet: 2 helpers Assist level from toilet: 2 helpers Assist level to bedside commode (at bedside): Moderate assist (Pt 50 - 74%/lift or lower) Assist level from bedside commode (at bedside): Moderate assist (Pt 50 - 74%/lift or lower)  Function - Chair/bed transfer Chair/bed transfer method: Stand pivot Chair/bed transfer assist level: Moderate assist (Pt 50 - 74%/lift or lower) Chair/bed transfer assistive device: Armrests Chair/bed transfer details: Manual facilitation for weight shifting, Manual facilitation for placement  Function - Locomotion: Wheelchair Will patient use wheelchair at discharge?: Yes Type: Manual Max wheelchair distance: 100 Assist Level: Supervision or verbal cues Wheel 50 feet with 2 turns activity did not occur: Safety/medical concerns Assist Level: Supervision or verbal cues Wheel 150 feet activity did not occur: Safety/medical concerns Turns around,maneuvers to table,bed, and toilet,negotiates 3% grade,maneuvers on rugs and over doorsills: No Function - Locomotion: Ambulation Max distance: 15 Assist level: 2 helpers Assist level: 2 helpers Walk 10 feet on uneven surfaces activity did not occur: Safety/medical concerns  Function - Comprehension Comprehension:  Auditory Comprehension assist level: Understands basic 90% of the time/cues < 10% of the time  Function - Expression Expression: Verbal Expression assist level: Expresses basic needs/ideas: With no assist  Function - Social Interaction Social Interaction assist level: Interacts appropriately 90% of the time - Needs monitoring or encouragement for participation or interaction.  Function - Problem Solving Problem solving assist level: Solves basic 90% of the time/requires cueing < 10% of the time  Function - Memory Memory assist level: Recognizes or recalls 75 - 89% of the time/requires cueing 10 - 24% of the time Patient normally able to recall (first 3 days only): Current season Medical Problem List and Plan: 1.  Left side weakness secondary to nonhemorrhagic infarct posterior right corona radiata- cont CIR PT, OT, SLP, may need AFO 2.  DVT Prophylaxis/Anticoagulation: Subcutaneous Lovenox. Monitor platelet counts in signs of bleeding 3. Pain Management/chronic back pain. 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 Mood/PTSD. Zoloft 50 mg daily, Requip 0.25 mg 3 times a day 5. Neuropsych: This patient is capable of making decisions on her own behalf. 6. Skin/Wound Care: Routine skin checks, prob tinea pedis R foot start clotrimazole BID 7. Fluids/Electrolytes/Nutrition: Routine I&O's follow-up chemistries 8. Tobacco alcohol abuse. Counseling. No nicotine craving reported 9. History of GI bleed. Continue Pepcid twice a day. Follow-up CBC 10.  Hx Alcohol abuse denies ETOH for ~36mo LFTs ok 11. 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  Constipation improved  senna S  LOS (Days) 7 A FACE TO FACE EVALUATION WAS PERFORMED  Koree Staheli E 10/08/2015, 7:58 AM

## 2015-10-09 ENCOUNTER — Inpatient Hospital Stay (HOSPITAL_COMMUNITY): Payer: Medicaid Other | Admitting: Physical Therapy

## 2015-10-09 ENCOUNTER — Inpatient Hospital Stay (HOSPITAL_COMMUNITY): Payer: Medicaid Other

## 2015-10-09 ENCOUNTER — Inpatient Hospital Stay (HOSPITAL_COMMUNITY): Payer: Medicaid Other | Admitting: Speech Pathology

## 2015-10-09 LAB — CBC WITH DIFFERENTIAL/PLATELET
BASOS PCT: 0 %
Basophils Absolute: 0 10*3/uL (ref 0.0–0.1)
EOS ABS: 0.1 10*3/uL (ref 0.0–0.7)
Eosinophils Relative: 1 %
HEMATOCRIT: 41.3 % (ref 36.0–46.0)
HEMOGLOBIN: 14 g/dL (ref 12.0–15.0)
Lymphocytes Relative: 34 %
Lymphs Abs: 2.3 10*3/uL (ref 0.7–4.0)
MCH: 31 pg (ref 26.0–34.0)
MCHC: 33.9 g/dL (ref 30.0–36.0)
MCV: 91.6 fL (ref 78.0–100.0)
MONOS PCT: 14 %
Monocytes Absolute: 0.9 10*3/uL (ref 0.1–1.0)
NEUTROS ABS: 3.6 10*3/uL (ref 1.7–7.7)
NEUTROS PCT: 51 %
Platelets: 147 10*3/uL — ABNORMAL LOW (ref 150–400)
RBC: 4.51 MIL/uL (ref 3.87–5.11)
RDW: 13 % (ref 11.5–15.5)
WBC: 6.9 10*3/uL (ref 4.0–10.5)

## 2015-10-09 NOTE — Progress Notes (Signed)
 60 y.o. right handed female with history of chronic back pain, tobacco and alcohol abuse, PTSD, GI bleed. Per chart review patient lives with nieces. Two-level home with bed and bath upstairs.Reported to use a single-point cane prior to admission.  Presented 09/26/2015 with acute onset of left-sided weakness. Cranial CT scan negative. 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 Multiple remote lacunar infarcts involving the bilateral external capsules/basal ganglia and the right paramedian pons Subjective/Complaints: Complains that tramdol doesn't work as well as percocet ROS- no N/V/D, No CP, SOB  Objective: Vital Signs: Blood pressure 110/62, pulse 81, temperature 98.2 F (36.8 C), temperature source Oral, resp. rate 17, weight 67.7 kg (149 lb 3.2 oz), SpO2 96 %. No results found. Results for orders placed or performed during the hospital encounter of 10/01/15 (from the past 72 hour(s))  Creatinine, serum     Status: None   Collection Time: 10/08/15  4:23 AM  Result Value Ref Range   Creatinine, Ser 0.84 0.44 - 1.00 mg/dL   GFR calc non Af Amer >60 >60 mL/min   GFR calc Af Amer >60 >60 mL/min    Comment: (NOTE) The eGFR has been calculated using the CKD EPI equation. This calculation has not been validated in all clinical situations. eGFR's persistently <60 mL/min signify possible Chronic Kidney Disease.      HEENT: normal Cardio: RRR and no murmur Resp: CTA B/L and unlabored GI: BS positive and NT,ND Extremity:  Pulses positive and No Edema Skin:  Small scab and hyperpigmentation between toes on R foot digits 4 and 5 Neuro: Abnormal Sensory decreased LT toes of L foot, Abnormal Motor 5/5 in RUE and RLE, 3- L delt bi tri grip HF KE ADF and Abnormal FMC Ataxic/ dec FMC Musc/Skel:  Other mild tenderness over L hip greater troch and surrounding area, no pain with L hip ROM Gen NAD   Assessment/Plan: 1. Functional  deficits secondary to Left hemiparesis which require 3+ hours per day of interdisciplinary therapy in a comprehensive inpatient rehab setting. Physiatrist is providing close team supervision and 24 hour management of active medical problems listed below. Physiatrist and rehab team continue to assess barriers to discharge/monitor patient progress toward functional and medical goals. FIM: Function - Bathing Position: Shower Body parts bathed by patient: Right arm, Left arm, Chest, Abdomen, Front perineal area, Right upper leg, Left upper leg, Right lower leg, Left lower leg Body parts bathed by helper: Back, Buttocks Bathing not applicable: Back Assist Level: Touching or steadying assistance(Pt > 75%)  Function- Upper Body Dressing/Undressing What is the patient wearing?: Pull over shirt/dress, Bra Bra - Perfomed by patient: Thread/unthread right bra strap, Thread/unthread left bra strap Bra - Perfomed by helper: Hook/unhook bra (pull down sports bra) Pull over shirt/dress - Perfomed by patient: Thread/unthread right sleeve, Thread/unthread left sleeve, Put head through opening, Pull shirt over trunk Pull over shirt/dress - Perfomed by helper: Thread/unthread left sleeve Assist Level: Set up Function - Lower Body Dressing/Undressing What is the patient wearing?: Shoes, Ted Hose Position: Wheelchair/chair at sink Underwear - Performed by patient: Thread/unthread right underwear leg, Thread/unthread left underwear leg Underwear - Performed by helper: Pull underwear up/down Pants- Performed by patient: Thread/unthread right pants leg, Thread/unthread left pants leg, Fasten/unfasten pants Pants- Performed by helper: Pull pants up/down Non-skid slipper socks- Performed by patient: Don/doff right sock, Don/doff left sock Non-skid slipper socks- Performed by helper: Don/doff right sock, Don/doff left sock Shoes - Performed by patient:   Don/doff right shoe, Don/doff left shoe, Fasten left Shoes -  Performed by helper: Fasten right TED Hose - Performed by helper: Don/doff right TED hose, Don/doff left TED hose Assist for footwear: Maximal assist Assist for lower body dressing: Touching or steadying assistance (Pt > 75%)  Function - Toileting Toileting steps completed by patient: Performs perineal hygiene, Adjust clothing prior to toileting Toileting steps completed by helper: Adjust clothing after toileting Toileting Assistive Devices: Grab bar or rail Assist level: Touching or steadying assistance (Pt.75%)  Function - Toilet Transfers Toilet transfer assistive device: Elevated toilet seat/BSC over toilet Assist level to toilet: Moderate assist (Pt 50 - 74%/lift or lower) Assist level from toilet: Maximal assist (Pt 25 - 49%/lift and lower) Assist level to bedside commode (at bedside): Moderate assist (Pt 50 - 74%/lift or lower) Assist level from bedside commode (at bedside): Moderate assist (Pt 50 - 74%/lift or lower)  Function - Chair/bed transfer Chair/bed transfer method: Squat pivot Chair/bed transfer assist level: Moderate assist (Pt 50 - 74%/lift or lower) Chair/bed transfer assistive device: Armrests Chair/bed transfer details: Manual facilitation for weight shifting, Manual facilitation for placement  Function - Locomotion: Wheelchair Will patient use wheelchair at discharge?: Yes Type: Manual Max wheelchair distance: 50 Assist Level: Moderate assistance (Pt 50 - 74%) Wheel 50 feet with 2 turns activity did not occur: Safety/medical concerns Assist Level: Moderate assistance (Pt 50 - 74%) Wheel 150 feet activity did not occur: Safety/medical concerns Turns around,maneuvers to table,bed, and toilet,negotiates 3% grade,maneuvers on rugs and over doorsills: No Function - Locomotion: Ambulation Max distance: 15 Assist level: 2 helpers Assist level: 2 helpers Walk 10 feet on uneven surfaces activity did not occur: Safety/medical concerns  Function -  Comprehension Comprehension: Auditory Comprehension assist level: Follows basic conversation/direction with extra time/assistive device  Function - Expression Expression: Verbal Expression assist level: Expresses complex 90% of the time/cues < 10% of the time  Function - Social Interaction Social Interaction assist level: Interacts appropriately 90% of the time - Needs monitoring or encouragement for participation or interaction.  Function - Problem Solving Problem solving assist level: Solves basic 90% of the time/requires cueing < 10% of the time  Function - Memory Memory assist level: Recognizes or recalls 75 - 89% of the time/requires cueing 10 - 24% of the time Patient normally able to recall (first 3 days only): Current season Medical Problem List and Plan: 1.  Left side weakness secondary to nonhemorrhagic infarct posterior right corona radiata- cont CIR PT, OT, SLP, will order AFO 2.  DVT Prophylaxis/Anticoagulation: Subcutaneous Lovenox. Monitor platelet counts in signs of bleeding 3. Pain Management/chronic back pain. 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 Mood/PTSD. Zoloft 50 mg daily, Requip 0.25 mg 3 times a day 5. Neuropsych: This patient is capable of making decisions on her own behalf. 6. Skin/Wound Care: Routine skin checks, prob tinea pedis R foot start clotrimazole BID 7. Fluids/Electrolytes/Nutrition: Routine I&O's follow-up chemistries 8. Tobacco alcohol abuse. Counseling. No nicotine craving reported 9. History of GI bleed. Continue Pepcid twice a day. Follow-up CBC, recheck in am 10.  Hx  Alcohol abuse denies ETOH for ~9mo, LFTs ok 11. 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  Constipation improved  senna S  LOS (Days) 8 A FACE TO FACE EVALUATION WAS PERFORMED  KIRSTEINS,ANDREW E 10/09/2015, 8:16 AM    

## 2015-10-09 NOTE — Progress Notes (Addendum)
Physical Therapy Weekly Progress Note  Patient Details  Name: [name] MRN: [id number] Date of Birth: 03-10-1956  Beginning of progress report period: October 02, 2015 End of progress report period: October 09, 2015  Time: [phone] 83 minutes  1:1 Pt c/o "soreness" in B UEs, RN aware, meds rec'd during session.  W/c mobility with B UE and Rt LE with min A for steering in controlled environment, pt able to propel 30' before fatigued.  Gait at railing with mod A for posture and wt shifts, pt able to advance L LE without assist.  Gait with RW 25', 15' x 2 with mod A for wt shifts and L LE control, cues for safety and sequencing sit to stand with RW, max A for turning to sit with RW.  Nu step with focus on L hip control, x 8 minutes level 3. Supine NMR with focus on neutral Lt hip alignment and core and hip strengthening. Pt continues with difficulty fully activating L glutes.  Pt motivated to improve, pleased with progress on gait.  Patient has met 2 of 3 short term goals.  Pt improving functional transfers to mod A level, continues to be limited by Lt LE weakness and buckling, increased pushing tendencies with Rt side. Pt now able to perform gait with RW with mod A for wt shifts and L LE control during stance.  Pt unable to perform stairs with max A due to safety concerns.   Patient continues to demonstrate the following deficits: decreased Lt sided strength, impaired coordination, decreased endurance, impaired balance and therefore will continue to benefit from skilled PT intervention to enhance overall performance with activity tolerance, balance, postural control, ability to compensate for deficits, functional use of  left upper extremity and left lower extremity, awareness and coordination.  Patient progressing toward long term goals..  Continue plan of care.  PT Short Term Goals Week 1:  PT Short Term Goal 1 (Week 1): Pt will consistently perform functional transfers with mod A PT Short Term  Goal 1 - Progress (Week 1): Met PT Short Term Goal 2 (Week 1): Pt will gait in controlled environment with max A 25' PT Short Term Goal 2 - Progress (Week 1): Met PT Short Term Goal 3 (Week 1): Pt will negotiate 4 stairs with max A PT Short Term Goal 3 - Progress (Week 1): Progressing toward goal Week 2:  PT Short Term Goal 1 (Week 2): pt will perform w/c mobility 50' in controlled environment with min A PT Short Term Goal 2 (Week 2): Pt will gait 66' with mox A in controlled environment PT Short Term Goal 3 (Week 2): Pt will consistently perform transfers with min A   Skilled Therapeutic Interventions/Progress Updates: Ambulation/gait training;Discharge planning;Functional mobility training;Therapeutic Activities;Therapeutic Exercise;Wheelchair propulsion/positioning;Neuromuscular re-education;Balance/vestibular training;Cognitive remediation/compensation;Community reintegration;Functional electrical stimulation;Patient/family education;Stair training;UE/LE Coordination activities;UE/LE Strength taining/ROM;Splinting/orthotics;Pain management;DME/adaptive equipment instruction     See Function Navigator for Current Functional Status.   [name] 10/09/2015, 10:11 AM

## 2015-10-09 NOTE — Progress Notes (Signed)
Speech Language Pathology Weekly Progress and Session Note  Patient Details  Name: [name] MRN: [id number] Date of Birth: [birth year]  Beginning of progress report period: October 02, 2015 End of progress report period: October 09, 2015  Today's Date: 10/09/2015 SLP Individual Time: 0800-0900 SLP Individual Time Calculation (min): 60 min   Short Term Goals: Week 1: SLP Short Term Goal 1 (Week 1): Pt will utilize speech intelligibility strategies at the sentence level with Min A verbal cues to achieve 100% intelligibility.  SLP Short Term Goal 1 - Progress (Week 1): Met SLP Short Term Goal 2 (Week 1): Pt will demonstrate functional problem solving for mildly complex tasks with Min A verbal cues.  SLP Short Term Goal 2 - Progress (Week 1): Met SLP Short Term Goal 3 (Week 1): Pt will utilize external memory aids to recall new, daily information with Min A verbal and quetion cues.  SLP Short Term Goal 3 - Progress (Week 1): Met SLP Short Term Goal 4 (Week 1): Pt will demonstrate selective attention to functional tasks in a moderatey distracting environment for 30 with Min A verbal cues for redirection.  SLP Short Term Goal 4 - Progress (Week 1): Met SLP Short Term Goal 5 (Week 1): Pt will demonstrate anticipatory awareness and identify 3 tasks she can participate in at home safely with Min A verbal cues.  SLP Short Term Goal 5 - Progress (Week 1): Progressing toward goal    New Short Term Goals: Week 2: SLP Short Term Goal 1 (Week 2): Pt will demonstrate functional problem solving for moderately complex tasks with mod A verbal cues.  SLP Short Term Goal 2 (Week 2): Pt will demonstrate anticipatory awareness and identify 3 tasks she can participate in at home safely with Min A verbal cues.  SLP Short Term Goal 3 (Week 2): Pt will utilize external memory aids to recall novel detailed information with Min A verbal and question cues.   Weekly Progress Updates: Pt has met 4 of 5 goals during this  treatment period with significant improvement noted in pt's ability to attend to tasks. Will continue to advance goals to target higher level reasoning to maximize household involvement upon discharge.     Intensity: Minumum of 1-2 x/day, 30 to 90 minutes Frequency: 3 to 5 out of 7 days Duration/Length of Stay: 8/10 Treatment/Interventions: Patient/family education;Cueing hierarchy;Environmental controls;Cognitive remediation/compensation;Functional tasks   Daily Session  Skilled Therapeutic Interventions: Pt able to verbally sequence home activities [facility] with increased time and questions to extract increased detail. Pt required mod- max A for moderately complex problem solving re: time management. Improved planning/organizing this date with increased time.        Function:   Eating Eating                 Cognition Comprehension Comprehension assist level: Follows basic conversation/direction with extra time/assistive device  Expression   Expression assist level: Expresses complex 90% of the time/cues < 10% of the time  Social Interaction Social Interaction assist level: Interacts appropriately 90% of the time - Needs monitoring or encouragement for participation or interaction.  Problem Solving Problem solving assist level: Solves basic 90% of the time/requires cueing < 10% of the time  Memory Memory assist level: Recognizes or recalls 75 - 89% of the time/requires cueing 10 - 24% of the time   General    Pain Pain Assessment Pain Assessment: No/denies pain  Therapy/Group: Individual Therapy  Vinetta Bergamo MA, CCC-SLP 10/09/2015, 12:22 PM

## 2015-10-09 NOTE — Progress Notes (Signed)
Occupational Therapy Session Note  Patient Details  Name: [name] MRN: [id number] Date of Birth: 1955/05/24  Today's Date: 10/09/2015 OT Individual Time: 1300-1400 OT Individual Time Calculation (min): 60 min     Short Term Goals: Week 2:  OT Short Term Goal 1 (Week 2): Pt will complete toilet transfers with min assist using LRAD. OT Short Term Goal 2 (Week 2): Pt will perform shower transfers with min assist to walk-in shower. OT Short Term Goal 3 (Week 2): Pt will complete stand pivot toilet transfers with min assist to the 3:1.   OT Short Term Goal 4 (Week 2): Pt will perform LB dressing with min assist sit to stand.  OT Short Term Goal 5 (Week 2): Pt will use the LUE as an active assist for pulling up pants and tying shoes with supervision.   Skilled Therapeutic Interventions/Progress Updates:    Pt gathering clothing while seated in w/c upon arrival.  Pt stated she was ready to shower and change clothing.  Pt required mod A for SPT to tub bench in shower and completed bathing tasks while seated.  Pt performed lateral leans to bathe buttocks.  Pt completed dressing tasks with sit<>stand from w/c at sink.  Pt noted with slight lateral lean to her left but can correct with verbal cues.  Pt forgot to thread her LLE into pants first and required assistance.  Pt recognized her error when attempting to thread her LLE after her RLE.  Pt required steady A while standing to pull up pants.  Focus on functional transfers, sit<>stand, standing balance, increased functional use of LUE, activity tolerance, and safety awareness to increase independence with BADLs.   Therapy Documentation Precautions:  Precautions Precautions: Fall Precaution Comments: left hemiparesis, pusher to the left Restrictions Weight Bearing Restrictions: No   Pain:  Pt denied pain  See Function Navigator for Current Functional Status.   Therapy/Group: Individual Therapy  Rich Brave 10/09/2015, 2:03  PM

## 2015-10-10 ENCOUNTER — Inpatient Hospital Stay (HOSPITAL_COMMUNITY): Payer: Medicaid Other

## 2015-10-10 MED ORDER — LIDOCAINE 5 % EX PTCH
1.0000 | MEDICATED_PATCH | CUTANEOUS | Status: DC
Start: 2015-10-10 — End: 2015-10-26
  Administered 2015-10-10 – 2015-10-25 (×16): 1 via TRANSDERMAL
  Filled 2015-10-10 (×17): qty 1

## 2015-10-10 NOTE — Plan of Care (Signed)
Problem: RH PAIN MANAGEMENT Goal: RH STG PAIN MANAGED AT OR BELOW PT'S PAIN GOAL Less than 3.On 1 to 10 scale.  Outcome: Not Progressing Continues to have pain rated 7 or 8 in L side with medications for same

## 2015-10-10 NOTE — Plan of Care (Signed)
Problem: RH PAIN MANAGEMENT Goal: RH STG PAIN MANAGED AT OR BELOW PT'S PAIN GOAL Less than 3.On 1 to 10 scale.  Outcome: Not Progressing Continues to have pain rated at 7 or 8 in L side with medications

## 2015-10-10 NOTE — Progress Notes (Signed)
60 y.o. right handed female with history of chronic back pain, tobacco and alcohol abuse, PTSD, GI bleed. Per chart review patient lives with nieces. Two-level home with bed and bath upstairs.Reported to use a single-point cane prior to admission.  Presented 09/26/2015 with acute onset of left-sided weakness. Cranial CT scan negative. 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 Multiple remote lacunar infarcts involving the bilateral external capsules/basal ganglia and the right paramedian pons Subjective/Complaints: Didn't sleep well--restless. Did not let nurse know. otherwise feeling ok. Denies pain  ROS- no N/V/D, No CP, SOB  Objective: Vital Signs: Blood pressure 110/65, pulse 69, temperature 97.9 F (36.6 C), temperature source Oral, resp. rate 18, weight 67.7 kg (149 lb 3.2 oz), SpO2 97 %. No results found. Results for orders placed or performed during the hospital encounter of 10/01/15 (from the past 72 hour(s))  Creatinine, serum     Status: None   Collection Time: 10/08/15  4:23 AM  Result Value Ref Range   Creatinine, Ser 0.84 0.44 - 1.00 mg/dL   GFR calc non Af Amer >60 >60 mL/min   GFR calc Af Amer >60 >60 mL/min    Comment: (NOTE) The eGFR has been calculated using the CKD EPI equation. This calculation has not been validated in all clinical situations. eGFR's persistently <60 mL/min signify possible Chronic Kidney Disease.   CBC with Differential/Platelet     Status: Abnormal   Collection Time: 10/09/15  9:29 AM  Result Value Ref Range   WBC 6.9 4.0 - 10.5 K/uL   RBC 4.51 3.87 - 5.11 MIL/uL   Hemoglobin 14.0 12.0 - 15.0 g/dL   HCT 41.3 36.0 - 46.0 %   MCV 91.6 78.0 - 100.0 fL   MCH 31.0 26.0 - 34.0 pg   MCHC 33.9 30.0 - 36.0 g/dL   RDW 13.0 11.5 - 15.5 %   Platelets 147 (L) 150 - 400 K/uL   Neutrophils Relative % 51 %   Neutro Abs 3.6 1.7 - 7.7 K/uL   Lymphocytes Relative 34 %   Lymphs Abs 2.3 0.7 -  4.0 K/uL   Monocytes Relative 14 %   Monocytes Absolute 0.9 0.1 - 1.0 K/uL   Eosinophils Relative 1 %   Eosinophils Absolute 0.1 0.0 - 0.7 K/uL   Basophils Relative 0 %   Basophils Absolute 0.0 0.0 - 0.1 K/uL     HEENT: normal Cardio: RRR and no murmur Resp: CTA B/L and unlabored GI: BS positive and NT,ND Extremity:  Pulses positive and No Edema Skin:  Small scab and hyperpigmentation between toes on R foot digits 4 and 5 Neuro: Abnormal Sensory decreased LT toes of L foot, Abnormal Motor 5/5 in RUE and RLE, 3- to 3/5 L delt bi tri grip HF KE ADF and Abnormal FMC Ataxic/ dec FMC -fair insight and awareness Musc/Skel:  Other mild tenderness over L hip greater troch and surrounding area, no pain with L hip ROM -left heel cord tight Gen NAD   Assessment/Plan: 1. Functional deficits secondary to Left hemiparesis which require 3+ hours per day of interdisciplinary therapy in a comprehensive inpatient rehab setting. Physiatrist is providing close team supervision and 24 hour management of active medical problems listed below. Physiatrist and rehab team continue to assess barriers to discharge/monitor patient progress toward functional and medical goals. FIM: Function - Bathing Position: Shower Body parts bathed by patient: Right arm, Left arm, Chest, Abdomen, Front perineal area, Right upper leg, Left upper  leg, Right lower leg, Left lower leg Body parts bathed by helper: Back, Buttocks Bathing not applicable: Back Assist Level: Touching or steadying assistance(Pt > 75%)  Function- Upper Body Dressing/Undressing What is the patient wearing?: Pull over shirt/dress, Bra Bra - Perfomed by patient: Thread/unthread right bra strap, Thread/unthread left bra strap Bra - Perfomed by helper: Hook/unhook bra (pull down sports bra) Pull over shirt/dress - Perfomed by patient: Thread/unthread right sleeve, Thread/unthread left sleeve, Put head through opening, Pull shirt over trunk Pull over  shirt/dress - Perfomed by helper: Thread/unthread left sleeve Assist Level: Touching or steadying assistance(Pt > 75%) Function - Lower Body Dressing/Undressing What is the patient wearing?: Underwear, Pants, Non-skid slipper socks Position: Wheelchair/chair at sink Underwear - Performed by patient: Thread/unthread right underwear leg, Thread/unthread left underwear leg, Pull underwear up/down Underwear - Performed by helper: Pull underwear up/down Pants- Performed by patient: Thread/unthread right pants leg, Fasten/unfasten pants, Pull pants up/down Pants- Performed by helper: Pull pants up/down Non-skid slipper socks- Performed by patient: Don/doff right sock, Don/doff left sock Non-skid slipper socks- Performed by helper: Don/doff right sock, Don/doff left sock Shoes - Performed by patient: Don/doff right shoe, Don/doff left shoe, Fasten left Shoes - Performed by helper: Fasten right TED Hose - Performed by helper: Don/doff right TED hose, Don/doff left TED hose Assist for footwear: Maximal assist Assist for lower body dressing: Touching or steadying assistance (Pt > 75%)  Function - Toileting Toileting steps completed by patient: Performs perineal hygiene, Adjust clothing prior to toileting Toileting steps completed by helper: Adjust clothing prior to toileting Toileting Assistive Devices: Grab bar or rail Assist level: Touching or steadying assistance (Pt.75%)  Function - Toilet Transfers Toilet transfer assistive device: Grab bar Assist level to toilet: Touching or steadying assistance (Pt > 75%) Assist level from toilet: Touching or steadying assistance (Pt > 75%) Assist level to bedside commode (at bedside): Moderate assist (Pt 50 - 74%/lift or lower) Assist level from bedside commode (at bedside): Moderate assist (Pt 50 - 74%/lift or lower)  Function - Chair/bed transfer Chair/bed transfer method: Squat pivot Chair/bed transfer assist level: Moderate assist (Pt 50 - 74%/lift  or lower) Chair/bed transfer assistive device: Armrests Chair/bed transfer details: Manual facilitation for weight shifting, Manual facilitation for placement  Function - Locomotion: Wheelchair Will patient use wheelchair at discharge?: Yes Type: Manual Max wheelchair distance: 50 Assist Level: Moderate assistance (Pt 50 - 74%) Wheel 50 feet with 2 turns activity did not occur: Safety/medical concerns Assist Level: Moderate assistance (Pt 50 - 74%) Wheel 150 feet activity did not occur: Safety/medical concerns Turns around,maneuvers to table,bed, and toilet,negotiates 3% grade,maneuvers on rugs and over doorsills: No Function - Locomotion: Ambulation Max distance: 15 Assist level: 2 helpers Assist level: 2 helpers Walk 10 feet on uneven surfaces activity did not occur: Safety/medical concerns  Function - Comprehension Comprehension: Auditory Comprehension assist level: Follows complex conversation/direction with extra time/assistive device  Function - Expression Expression: Verbal Expression assist level: Expresses basic 90% of the time/requires cueing < 10% of the time., Expresses basic 75 - 89% of the time/requires cueing 10 - 24% of the time. Needs helper to occlude trach/needs to repeat words.  Function - Social Interaction Social Interaction assist level: Interacts appropriately 75 - 89% of the time - Needs redirection for appropriate language or to initiate interaction.  Function - Problem Solving Problem solving assist level: Solves basic 90% of the time/requires cueing < 10% of the time  Function - Memory Memory assist level: Recognizes or recalls  75 - 89% of the time/requires cueing 10 - 24% of the time Patient normally able to recall (first 3 days only): Current season Medical Problem List and Plan: 1.  Left side weakness secondary to nonhemorrhagic infarct posterior right corona radiata-  - cont CIR PT, OT, SLP,   - AFO  -left heel cord tight---needs to stretch more  aggressively 2.  DVT Prophylaxis/Anticoagulation: Subcutaneous Lovenox. Monitor platelet counts in signs of bleeding 3. Pain Management/chronic back pain. 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 Mood/PTSD. Zoloft 50 mg daily, Requip 0.25 mg 3 times a day 5. Neuropsych: This patient is capable of making decisions on her own behalf. 6. Skin/Wound Care: Routine skin checks, prob tinea pedis R foot start clotrimazole BID 7. Fluids/Electrolytes/Nutrition: Routine I&O's follow-up chemistries 8. Tobacco alcohol abuse. Counseling. No nicotine craving reported 9. History of GI bleed. Continue Pepcid twice a day.   -recent hgb 14 10.  Hx Alcohol abuse denies ETOH for ~60mo LFTs ok 11. 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  Constipation improved  senna S  LOS (Days) 9 A FACE TO FACE EVALUATION WAS PERFORMED  [name] T 10/10/2015, 7:51 AM

## 2015-10-10 NOTE — Progress Notes (Signed)
Occupational Therapy Session Note  Patient Details  Name: [name] MRN: [id number] Date of Birth: Feb 04, 1956  Today's Date: 10/10/2015 OT Individual Time: 0915-1000 OT Individual Time Calculation (min): 45 min     Short Term Goals: Week 1:  OT Short Term Goal 1 (Week 1): Pt will complete LB bathing sit to stand with mod assist. OT Short Term Goal 1 - Progress (Week 1): Met OT Short Term Goal 2 (Week 1): Pt will complete LB dressing sit to stand with mod assist.  OT Short Term Goal 2 - Progress (Week 1): Met OT Short Term Goal 3 (Week 1): Pt will complete toilet transfers with min assist using LRAD. OT Short Term Goal 3 - Progress (Week 1): Not met OT Short Term Goal 4 (Week 1): Pt will perform shower transfers with min assist to walk-in shower. OT Short Term Goal 4 - Progress (Week 1): Not met OT Short Term Goal 5 (Week 1): Pt will use the LUE as a gross assist for bathing and dressing tasks with min assist.  OT Short Term Goal 5 - Progress (Week 1): Met  Skilled Therapeutic Interventions/Progress Updates:    OT session focused on ADL retraining, functional transfers, L NMR, and postural control. Pt received sitting on toilet with NT present. Pt completed stand pivot transfer toilet>w/c with min A and min cues for sequencing. Completed bathing and dressing sit<>stand at sink requiring min-mod A for standing balance and tactile cues for postural control and use of LUE to push up from chair during stands. OT engaged client in L NMR activity focusing on FM skills as she picked up and placed small manipulatives into target, utilizing pincer grasp <50% of the time. At end of session pt left sitting in w/c with all needs in reach.  Therapy Documentation Precautions:  Precautions Precautions: Fall Precaution Comments: left hemiparesis, pusher to the left Restrictions Weight Bearing Restrictions: No General:   Vital Signs:   Pain:   ADL:   Exercises:   Other Treatments:    See  Function Navigator for Current Functional Status.   Therapy/Group: Individual Therapy  Duayne Cal 10/10/2015, 10:43 AM

## 2015-10-11 ENCOUNTER — Inpatient Hospital Stay (HOSPITAL_COMMUNITY): Payer: Medicaid Other | Admitting: Occupational Therapy

## 2015-10-11 MED ORDER — TRAZODONE HCL 50 MG PO TABS
50.0000 mg | ORAL_TABLET | Freq: Every evening | ORAL | Status: DC | PRN
  Administered 2015-10-12 – 2015-10-24 (×9): 50 mg via ORAL
  Filled 2015-10-11 (×9): qty 1

## 2015-10-11 NOTE — Progress Notes (Signed)
60 y.o. right handed female with history of chronic back pain, tobacco and alcohol abuse, PTSD, GI bleed. Per chart review patient lives with nieces. Two-level home with bed and bath upstairs.Reported to use a single-point cane prior to admission.  Presented 09/26/2015 with acute onset of left-sided weakness. Cranial CT scan negative. 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 Multiple remote lacunar infarcts involving the bilateral external capsules/basal ganglia and the right paramedian pons   Subjective/Complaints: Still didn't sleep well. Didn't let her nurse know again. Otherwise no complaints.   ROS- no N/V/D, No CP, SOB  Objective: Vital Signs: Blood pressure (!) 101/53, pulse 71, temperature 98.3 F (36.8 C), temperature source Oral, resp. rate 18, weight 67.7 kg (149 lb 3.2 oz), SpO2 97 %. No results found. Results for orders placed or performed during the hospital encounter of 10/01/15 (from the past 72 hour(s))  CBC with Differential/Platelet     Status: Abnormal   Collection Time: 10/09/15  9:29 AM  Result Value Ref Range   WBC 6.9 4.0 - 10.5 K/uL   RBC 4.51 3.87 - 5.11 MIL/uL   Hemoglobin 14.0 12.0 - 15.0 g/dL   HCT 65.7 84.6 - 96.2 %   MCV 91.6 78.0 - 100.0 fL   MCH 31.0 26.0 - 34.0 pg   MCHC 33.9 30.0 - 36.0 g/dL   RDW 95.2 84.1 - 32.4 %   Platelets 147 (L) 150 - 400 K/uL   Neutrophils Relative % 51 %   Neutro Abs 3.6 1.7 - 7.7 K/uL   Lymphocytes Relative 34 %   Lymphs Abs 2.3 0.7 - 4.0 K/uL   Monocytes Relative 14 %   Monocytes Absolute 0.9 0.1 - 1.0 K/uL   Eosinophils Relative 1 %   Eosinophils Absolute 0.1 0.0 - 0.7 K/uL   Basophils Relative 0 %   Basophils Absolute 0.0 0.0 - 0.1 K/uL     HEENT: normal Cardio: RRR and no murmur Resp: CTA B/L and unlabored GI: BS positive and NT,ND Extremity:  Pulses positive and No Edema Skin:  Small scab and hyperpigmentation between toes on R foot digits 4  and 5 Neuro: Abnormal Sensory decreased LT toes of L foot, Abnormal Motor 5/5 in RUE and RLE, 3- to 3/5 L delt bi tri grip HF KE ADF and Abnormal FMC Ataxic/ dec FMC -fair insight and awareness Musc/Skel:  Other mild tenderness over L hip greater troch and surrounding area, no pain with L hip ROM -left heel cord tight but can be flexed to 90 Gen NAD   Assessment/Plan: 1. Functional deficits secondary to Left hemiparesis which require 3+ hours per day of interdisciplinary therapy in a comprehensive inpatient rehab setting. Physiatrist is providing close team supervision and 24 hour management of active medical problems listed below. Physiatrist and rehab team continue to assess barriers to discharge/monitor patient progress toward functional and medical goals. FIM: Function - Bathing Position: Wheelchair/chair at sink Body parts bathed by patient: Right arm, Left arm, Chest, Abdomen, Front perineal area, Right upper leg, Left upper leg, Right lower leg, Left lower leg Body parts bathed by helper: Back, Buttocks Bathing not applicable: Back Assist Level: Touching or steadying assistance(Pt > 75%)  Function- Upper Body Dressing/Undressing What is the patient wearing?: Pull over shirt/dress Bra - Perfomed by patient: Thread/unthread right bra strap, Thread/unthread left bra strap Bra - Perfomed by helper: Hook/unhook bra (pull down sports bra) Pull over shirt/dress - Perfomed by patient: Thread/unthread right sleeve,  Thread/unthread left sleeve, Put head through opening, Pull shirt over trunk Pull over shirt/dress - Perfomed by helper: Thread/unthread left sleeve Assist Level: Set up Set up : To obtain clothing/put away Function - Lower Body Dressing/Undressing What is the patient wearing?: Underwear, Pants, Non-skid slipper socks Position: Wheelchair/chair at sink Underwear - Performed by patient: Thread/unthread right underwear leg, Thread/unthread left underwear leg, Pull underwear  up/down Underwear - Performed by helper: Pull underwear up/down Pants- Performed by patient: Thread/unthread right pants leg, Fasten/unfasten pants Pants- Performed by helper: Pull pants up/down, Thread/unthread left pants leg Non-skid slipper socks- Performed by patient: Don/doff right sock, Don/doff left sock Non-skid slipper socks- Performed by helper: Don/doff right sock, Don/doff left sock Shoes - Performed by patient: Don/doff right shoe, Don/doff left shoe, Fasten left Shoes - Performed by helper: Fasten right TED Hose - Performed by helper: Don/doff right TED hose, Don/doff left TED hose Assist for footwear: Maximal assist Assist for lower body dressing: Touching or steadying assistance (Pt > 75%)  Function - Toileting Toileting steps completed by patient: Adjust clothing prior to toileting, Performs perineal hygiene Toileting steps completed by helper: Adjust clothing prior to toileting Toileting Assistive Devices: Grab bar or rail Assist level: Touching or steadying assistance (Pt.75%)  Function - Archivist transfer assistive device: Grab bar Assist level to toilet: Touching or steadying assistance (Pt > 75%) Assist level from toilet: Touching or steadying assistance (Pt > 75%) Assist level to bedside commode (at bedside): Moderate assist (Pt 50 - 74%/lift or lower) Assist level from bedside commode (at bedside): Moderate assist (Pt 50 - 74%/lift or lower)  Function - Chair/bed transfer Chair/bed transfer method: Squat pivot Chair/bed transfer assist level: Moderate assist (Pt 50 - 74%/lift or lower) Chair/bed transfer assistive device: Armrests, Bedrails Chair/bed transfer details: Manual facilitation for weight shifting, Manual facilitation for placement  Function - Locomotion: Wheelchair Will patient use wheelchair at discharge?: Yes Type: Manual Max wheelchair distance: 50 Assist Level: Moderate assistance (Pt 50 - 74%) Wheel 50 feet with 2 turns  activity did not occur: Safety/medical concerns Assist Level: Moderate assistance (Pt 50 - 74%) Wheel 150 feet activity did not occur: Safety/medical concerns Turns around,maneuvers to table,bed, and toilet,negotiates 3% grade,maneuvers on rugs and over doorsills: No Function - Locomotion: Ambulation Max distance: 15 Assist level: 2 helpers Assist level: 2 helpers Walk 10 feet on uneven surfaces activity did not occur: Safety/medical concerns  Function - Comprehension Comprehension: Auditory Comprehension assist level: Follows complex conversation/direction with no assist  Function - Expression Expression: Verbal Expression assist level: Expresses basic 90% of the time/requires cueing < 10% of the time.  Function - Social Interaction Social Interaction assist level: Interacts appropriately 75 - 89% of the time - Needs redirection for appropriate language or to initiate interaction.  Function - Problem Solving Problem solving assist level: Solves basic 90% of the time/requires cueing < 10% of the time  Function - Memory Memory assist level: Recognizes or recalls 75 - 89% of the time/requires cueing 10 - 24% of the time Patient normally able to recall (first 3 days only): Current season, That he or she is in a hospital Medical Problem List and Plan: 1.  Left side weakness secondary to nonhemorrhagic infarct posterior right corona radiata-  - cont CIR PT, OT, SLP,   - AFO  -left heel cord still tight--- stretch more aggressively 2.  DVT Prophylaxis/Anticoagulation: Subcutaneous Lovenox. Monitor platelet counts in signs of bleeding 3. Pain Management/chronic back pain. 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 Mood/PTSD. Zoloft 50 mg daily, Requip 0.25 mg 3 times a day 5. Neuropsych: This patient is capable of making decisions on her own behalf. 6. Skin/Wound Care: Routine skin checks, prob tinea pedis R foot started clotrimazole BID 7. Fluids/Electrolytes/Nutrition: Routine I&O's follow-up chemistries 8. Tobacco alcohol abuse. Counseling. No nicotine craving reported 9. History of GI bleed. Continue Pepcid twice a day.   -recent hgb 14 10.  Hx Alcohol abuse denies ETOH for ~29mo, LFTs ok 11. 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  Constipation- improved  senna S 13. Insomnia: prn trazodone  LOS (Days) 10 A FACE TO FACE EVALUATION WAS PERFORMED  Raigan Baria T 10/11/2015, 7:40 AM

## 2015-10-11 NOTE — Progress Notes (Addendum)
Occupational Therapy Session Note  Patient Details  Name: [name] MRN: [id number] Date of Birth: Feb 20, 1956  Today's Date: 10/11/2015 OT Individual Time: 8889-1694 OT Individual Time Calculation (min): 61 min     Short Term Goals: Week 2:  OT Short Term Goal 1 (Week 2): Pt will complete toilet transfers with min assist using LRAD. OT Short Term Goal 2 (Week 2): Pt will perform shower transfers with min assist to walk-in shower. OT Short Term Goal 3 (Week 2): Pt will complete stand pivot toilet transfers with min assist to the 3:1.   OT Short Term Goal 4 (Week 2): Pt will perform LB dressing with min assist sit to stand.  OT Short Term Goal 5 (Week 2): Pt will use the LUE as an active assist for pulling up pants and tying shoes with supervision.      Skilled Therapeutic Interventions/Progress Updates:    Pt was sitting up at EOB after breakfast upon skilled OT arrival. Pt was agreeable to completing shower this morning. Pt suddenly doffed shirt at EOB with posterior LOB and therapist correction, dynamic sitting balance deficits observed. Pt completed transfer from bed<w/c with Min A and max instruction on hand placement. Shower bench transfer completed with Min A and grab bars. In shower pt required Mod A for balance while standing for pericare. Pt cued to attend to left pushing and posture improved. Pt educated on maintaining midline orientation during shower and using L UE as gross assist. After shower, pt completed dressing tasks standing as needed at sink. Min A required for standing balance and pulling up pants. Max vcs required for carryover of hemi dressing techniques. Pt c/o pain in left side. Nursing notified and pt medicated during session. Pt left in w/c at end of session with QRB and all needs within reach. Pt benefits from continued education on pushing up from surfaces instead of pulling on surfaces for sit to stands.   Therapy Documentation Precautions:   Precautions Precautions: Fall Precaution Comments: left hemiparesis, pusher to the left Restrictions Weight Bearing Restrictions: No   Pain: Pt medicated during session  Pain Assessment Pain Assessment: 0-10 Pain Score: 7  Pain Type: Chronic pain Pain Descriptors / Indicators: Aching Pain Frequency: Intermittent Pain Onset: Progressive Pain Intervention(s): Medication (See eMAR)     See Function Navigator for Current Functional Status.   Therapy/Group: Individual Therapy  Airen Dales A Shakti Fleer 10/11/2015, 12:31 PM

## 2015-10-12 ENCOUNTER — Inpatient Hospital Stay (HOSPITAL_COMMUNITY): Payer: Medicaid Other | Admitting: Occupational Therapy

## 2015-10-12 ENCOUNTER — Inpatient Hospital Stay (HOSPITAL_COMMUNITY): Payer: Medicaid Other | Admitting: Speech Pathology

## 2015-10-12 ENCOUNTER — Inpatient Hospital Stay (HOSPITAL_COMMUNITY): Payer: Medicaid Other

## 2015-10-12 NOTE — Progress Notes (Signed)
Subjective/Complaints: Pt working with therapies.  She states she had a good weekend.   ROS- Denies N/V/D, CP, SOB  Objective: Vital Signs: Blood pressure (!) 107/59, pulse 71, temperature 97.9 F (36.6 C), temperature source Oral, resp. rate 18, weight 67.7 kg (149 lb 3.2 oz), SpO2 97 %. No results found. Results for orders placed or performed during the hospital encounter of 10/01/15 (from the past 72 hour(s))  CBC with Differential/Platelet     Status: Abnormal   Collection Time: 10/09/15  9:29 AM  Result Value Ref Range   WBC 6.9 4.0 - 10.5 K/uL   RBC 4.51 3.87 - 5.11 MIL/uL   Hemoglobin 14.0 12.0 - 15.0 g/dL   HCT 21.3 08.6 - 57.8 %   MCV 91.6 78.0 - 100.0 fL   MCH 31.0 26.0 - 34.0 pg   MCHC 33.9 30.0 - 36.0 g/dL   RDW 46.9 62.9 - 52.8 %   Platelets 147 (L) 150 - 400 K/uL   Neutrophils Relative % 51 %   Neutro Abs 3.6 1.7 - 7.7 K/uL   Lymphocytes Relative 34 %   Lymphs Abs 2.3 0.7 - 4.0 K/uL   Monocytes Relative 14 %   Monocytes Absolute 0.9 0.1 - 1.0 K/uL   Eosinophils Relative 1 %   Eosinophils Absolute 0.1 0.0 - 0.7 K/uL   Basophils Relative 0 %   Basophils Absolute 0.0 0.0 - 0.1 K/uL    Gen NAD. Vital signs reviewed.  HEENT: Normocephalic, atraumatic Cardio: RRR and no murmur Resp: CTA B/L and unlabored GI: BS positive and NT,ND Musc/Skel:  No edema.  No tenderness. Neuro:  Motor 5/5 in RUE and RLE LUE: 4-/5 L delt, bi, tri, grip  LLE: 4-/5 HF KE, 3- ADF/PF  Skin:  Warm and dry.  Assessment/Plan: 1. Functional deficits secondary to Left hemiparesis which require 3+ hours per day of interdisciplinary therapy in a comprehensive inpatient rehab setting. Physiatrist is providing close team supervision and 24 hour management of active medical problems listed below. Physiatrist and rehab team continue to assess barriers to discharge/monitor patient progress toward functional and medical goals. FIM: Function - Bathing Position: Shower Body parts bathed by  patient: Right arm, Left arm, Chest, Abdomen, Front perineal area, Right upper leg, Left upper leg, Right lower leg, Left lower leg, Back Body parts bathed by helper: Buttocks Bathing not applicable: Back Assist Level: Touching or steadying assistance(Pt > 75%)  Function- Upper Body Dressing/Undressing What is the patient wearing?: Bra, Pull over shirt/dress Bra - Perfomed by patient: Thread/unthread right bra strap, Thread/unthread left bra strap Bra - Perfomed by helper: Hook/unhook bra (pull down sports bra) Pull over shirt/dress - Perfomed by patient: Thread/unthread right sleeve, Thread/unthread left sleeve, Put head through opening, Pull shirt over trunk Pull over shirt/dress - Perfomed by helper: Thread/unthread left sleeve Assist Level: Supervision or verbal cues Set up : To obtain clothing/put away Function - Lower Body Dressing/Undressing What is the patient wearing?: Underwear, Pants, Non-skid slipper socks Position: Wheelchair/chair at sink Underwear - Performed by patient: Thread/unthread right underwear leg, Thread/unthread left underwear leg, Pull underwear up/down Underwear - Performed by helper: Pull underwear up/down Pants- Performed by patient: Thread/unthread right pants leg, Thread/unthread left pants leg Pants- Performed by helper: Pull pants up/down Non-skid slipper socks- Performed by patient: Don/doff left sock, Don/doff right sock Non-skid slipper socks- Performed by helper: Don/doff right sock, Don/doff left sock Shoes - Performed by patient: Don/doff right shoe, Don/doff left shoe, Fasten left Shoes - Performed by helper:  Fasten right TED Hose - Performed by helper: Don/doff right TED hose, Don/doff left TED hose Assist for footwear: Maximal assist Assist for lower body dressing: Touching or steadying assistance (Pt > 75%)  Function - Toileting Toileting steps completed by patient: Adjust clothing prior to toileting, Performs perineal hygiene Toileting steps  completed by helper: Adjust clothing prior to toileting Toileting Assistive Devices: Grab bar or rail Assist level: Touching or steadying assistance (Pt.75%)  Function - Archivist transfer assistive device: Grab bar Assist level to toilet: Touching or steadying assistance (Pt > 75%) Assist level from toilet: Touching or steadying assistance (Pt > 75%) Assist level to bedside commode (at bedside): Moderate assist (Pt 50 - 74%/lift or lower) Assist level from bedside commode (at bedside): Moderate assist (Pt 50 - 74%/lift or lower)  Function - Chair/bed transfer Chair/bed transfer method: Squat pivot Chair/bed transfer assist level: Touching or steadying assistance (Pt > 75%) Chair/bed transfer assistive device: Armrests, Bedrails Chair/bed transfer details: Manual facilitation for weight shifting, Verbal cues for technique, Verbal cues for sequencing, Verbal cues for precautions/safety  Function - Locomotion: Wheelchair Will patient use wheelchair at discharge?: Yes Type: Manual Max wheelchair distance: 50 Assist Level: Moderate assistance (Pt 50 - 74%) Wheel 50 feet with 2 turns activity did not occur: Safety/medical concerns Assist Level: Moderate assistance (Pt 50 - 74%) Wheel 150 feet activity did not occur: Safety/medical concerns Turns around,maneuvers to table,bed, and toilet,negotiates 3% grade,maneuvers on rugs and over doorsills: No Function - Locomotion: Ambulation Max distance: 15 Assist level: 2 helpers Assist level: 2 helpers Walk 10 feet on uneven surfaces activity did not occur: Safety/medical concerns  Function - Comprehension Comprehension: Auditory Comprehension assist level: Follows basic conversation/direction with extra time/assistive device  Function - Expression Expression: Verbal Expression assist level: Expresses complex 90% of the time/cues < 10% of the time  Function - Social Interaction Social Interaction assist level: Interacts  appropriately 75 - 89% of the time - Needs redirection for appropriate language or to initiate interaction.  Function - Problem Solving Problem solving assist level: Solves basic 90% of the time/requires cueing < 10% of the time  Function - Memory Memory assist level: Recognizes or recalls 75 - 89% of the time/requires cueing 10 - 24% of the time Patient normally able to recall (first 3 days only): Current season, Location of own room, Staff names and faces, That he or she is in a hospital Medical Problem List and Plan: 1.  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  DVT Prophylaxis/Anticoagulation: Subcutaneous Lovenox. Monitor platelet counts in signs of bleeding 3. Pain Management/chronic back pain. 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 Mood/PTSD. Zoloft 50 mg daily, Requip 0.25 mg 3 times a day 5. Neuropsych: This patient is capable of making decisions on her own behalf. 6. Skin/Wound Care: Routine skin checks, prob tinea pedis R foot started clotrimazole BID 7. Fluids/Electrolytes/Nutrition: Routine I&O's 8. Tobacco alcohol abuse. Counseling. No nicotine craving reported 9. History of GI bleed. Continue Pepcid twice a day.   -  recent hgb 14 10.  Hx Alcohol abuse denies ETOH for ~74mo 11. 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  Constipation- improved  senna S 13. Insomnia: prn trazodone  LOS (Days) 11 A FACE TO FACE EVALUATION WAS PERFORMED  Matthew Cina Karis Juba 10/12/2015, 9:14 AM

## 2015-10-12 NOTE — Progress Notes (Signed)
Speech Language Pathology Daily Session Note  Patient Details  Name: [name] MRN: [id number] Date of Birth: [birth year]  Today's Date: 10/12/2015 SLP Individual Time: 2035-5974 SLP Individual Time Calculation (min): 28 min   Short Term Goals: Week 2: SLP Short Term Goal 1 (Week 2): Pt will demonstrate functional problem solving for moderately complex tasks with mod A verbal cues.  SLP Short Term Goal 2 (Week 2): Pt will demonstrate anticipatory awareness and identify 3 tasks she can participate in at home safely with Min A verbal cues.  SLP Short Term Goal 3 (Week 2): Pt will utilize external memory aids to recall novel detailed information with Min A verbal and question cues.   Skilled Therapeutic Interventions:  Pt was seen for skilled ST targeting cognitive goals.  Pt was able to identify at least 3 activities for which she would need assistance in the home environment with supervision question cues during functional conversations with SLP.  Pt with questions about stroke recovery; therefore SLP provided skilled education regarding sequelae of stoke and realistic prognostic expectations for recovery given progress made thus far. All questions were answered to pt's satisfaction at this time.  Continue per current plan of care.    Function:  Eating Eating               Cognition Comprehension Comprehension assist level: Follows complex conversation/direction with extra time/assistive device  Expression   Expression assist level: Expresses complex 90% of the time/cues < 10% of the time  Social Interaction Social Interaction assist level: Interacts appropriately 90% of the time - Needs monitoring or encouragement for participation or interaction.  Problem Solving Problem solving assist level: Solves basic 90% of the time/requires cueing < 10% of the time  Memory Memory assist level: Recognizes or recalls 90% of the time/requires cueing < 10% of the time    Pain Pain  Assessment Pain Assessment: No/denies pain  Therapy/Group: Individual Therapy  Sherrise Liberto, Melanee Spry 10/12/2015, 7:28 PM

## 2015-10-12 NOTE — Progress Notes (Signed)
Physical Therapy Note  Patient Details  Name: [name] MRN: [id number] Date of Birth: [birth year] Today's Date: 10/12/2015 0800-0900, 60 min; 1400-1500, 60 min  individual tx Pain: 7/10 L lateral hip; premedicated AM; no c/o PM  tx 1: Bed mobility with supervision, rails to sit EOB for donning TEDS, shoes, shorts with assistance by PT for time management. Squat pivot bed> w/c > Nustep to L, with min assist, demonstrating understanding of head/hips relationship with min cues.  W/c propulsion using hemi method plus LUE. neuromuscular re-education via forced use, demo, mirror feedback,  manual cues for midline orientation in standing;  alternating reciprocal movement x 4 extremities on NuSTep at level 4 x 8 minutes; trunk shortening/lengthening/rotating in sitting to facilitate righting reactions; bil hip adduction in sitting for core activation x 20  Gait without AD x 10' with max assist due to over-shift to L. Pt left resting in w/c with quick release belt applied and all needs within reach.  tx 2:  Toilet transfer: pt stated she did not really need to go, but voided a moderate amount on toilet. neuromuscular re-education via forced use, demo, multimodal cues for bil UE use for w/c propulsion; R trunk elongation in sitting> unsupported sitting; L hamstrings passive stretch in sitting with footstool. Scooting L without use of UEs focusing on position of L foot.  Seated on wedge to L, for midline orientation, during 10 x 1 rowing with bil UEs 2# bar x 15. Gait training without AD, using raised mat to pt's R to decrease pusher tendencies. Gait with weighted grocery cart x 50' with ACE on LLE for foot drop, mod assist for wt shifting to R for L foot clearance, and L stance stability. Pt left resting in w/c with quick release belt applied and all needs within reach.   [name] 10/12/2015, 7:47 AM

## 2015-10-12 NOTE — Progress Notes (Signed)
Occupational Therapy Session Note  Patient Details  Name: [name] MRN: [id number] Date of Birth: 1955/03/19  Today's Date: 10/12/2015 OT Individual Time: 2703-5009 OT Individual Time Calculation (min): 61 min     Short Term Goals: Week 2:  OT Short Term Goal 1 (Week 2): Pt will complete toilet transfers with min assist using LRAD. OT Short Term Goal 2 (Week 2): Pt will perform shower transfers with min assist to walk-in shower. OT Short Term Goal 3 (Week 2): Pt will complete stand pivot toilet transfers with min assist to the 3:1.   OT Short Term Goal 4 (Week 2): Pt will perform LB dressing with min assist sit to stand.  OT Short Term Goal 5 (Week 2): Pt will use the LUE as an active assist for pulling up pants and tying shoes with supervision.   Skilled Therapeutic Interventions/Progress Updates:    Worked on neuromuscular re-education for the LUE, LLE, and trunk during session.  Emphasized functional reach with the LUE at times with supervision for gross grasp of items in sitting.  Worked on lateral weightshifts to the right for reaching and placing of clothespins to encourage left active trunk shortening and right trunk elongation.  Provided mirror for feedback as well to adjust trunk alignment.  Pt falls into a posterior pelvic tilt with increased cervical flexion during dynamic reaching tasks.  She needs mod facilitation to maintain anterior pelvic tilt with reaching laterally as well as forward.  Transitioned to squat position for placing clothes pins to the right with emphasis on weightshifting over the "pushing" RLE.  Mod assist needed to complete several intervals of this.  Finished neuro re-education with standing activity working on activation of the left hip and knee while stepping forward with the RLE to target.  Max assist needed for stabilizing the left knee and for pt to activate knee extension, as she wants to keep it flexed.  Completed stand pivot transfer with max assist  back to the chair to conclude session.  Pt left in wheelchair with call button and phone in reach and 1/2 lap tray in place to support the LUE.  Pt working on [employer] coordination homework as well.   Therapy Documentation Precautions:  Precautions Precautions: Fall Precaution Comments: left hemiparesis, pusher to the left Restrictions Weight Bearing Restrictions: No  Pain: Pain Assessment Pain Assessment: No/denies pain ADL: See Function Navigator for Current Functional Status.   Therapy/Group: Individual Therapy  Kavion Mancinas OTR/L 10/12/2015, 12:34 PM

## 2015-10-13 ENCOUNTER — Inpatient Hospital Stay (HOSPITAL_COMMUNITY): Payer: Medicaid Other | Admitting: Speech Pathology

## 2015-10-13 ENCOUNTER — Inpatient Hospital Stay (HOSPITAL_COMMUNITY): Payer: Medicaid Other | Admitting: Occupational Therapy

## 2015-10-13 ENCOUNTER — Inpatient Hospital Stay (HOSPITAL_COMMUNITY): Payer: Medicaid Other | Admitting: Physical Therapy

## 2015-10-13 DIAGNOSIS — N3289 Other specified disorders of bladder: Secondary | ICD-10-CM

## 2015-10-13 DIAGNOSIS — Z8719 Personal history of other diseases of the digestive system: Secondary | ICD-10-CM

## 2015-10-13 LAB — BASIC METABOLIC PANEL
ANION GAP: 7 (ref 5–15)
BUN: 12 mg/dL (ref 6–20)
CALCIUM: 9.7 mg/dL (ref 8.9–10.3)
CO2: 26 mmol/L (ref 22–32)
CREATININE: 0.77 mg/dL (ref 0.44–1.00)
Chloride: 109 mmol/L (ref 101–111)
GFR calc Af Amer: >60 mL/min (ref >60)
GLUCOSE: 87 mg/dL (ref 65–99)
Potassium: 3.8 mmol/L (ref 3.5–5.1)
Sodium: 142 mmol/L (ref 135–145)

## 2015-10-13 LAB — CBC WITH DIFFERENTIAL/PLATELET
BASOS ABS: 0 10*3/uL (ref 0.0–0.1)
BASOS PCT: 0 %
EOS ABS: 0.1 10*3/uL (ref 0.0–0.7)
EOS PCT: 1 %
HCT: 41.7 % (ref 36.0–46.0)
Hemoglobin: 13.4 g/dL (ref 12.0–15.0)
Lymphocytes Relative: 41 %
Lymphs Abs: 2.1 10*3/uL (ref 0.7–4.0)
MCH: 30.2 pg (ref 26.0–34.0)
MCHC: 32.1 g/dL (ref 30.0–36.0)
MCV: 93.9 fL (ref 78.0–100.0)
MONO ABS: 0.6 10*3/uL (ref 0.1–1.0)
Monocytes Relative: 12 %
NEUTROS ABS: 2.3 10*3/uL (ref 1.7–7.7)
Neutrophils Relative %: 46 %
PLATELETS: 147 10*3/uL — AB (ref 150–400)
RBC: 4.44 MIL/uL (ref 3.87–5.11)
RDW: 13 % (ref 11.5–15.5)
WBC: 5 10*3/uL (ref 4.0–10.5)

## 2015-10-13 MED ORDER — OXYBUTYNIN CHLORIDE 5 MG PO TABS
10.0000 mg | ORAL_TABLET | Freq: Every day | ORAL | Status: DC
  Administered 2015-10-13 – 2015-10-20 (×8): 10 mg via ORAL
  Filled 2015-10-13 (×7): qty 2

## 2015-10-13 NOTE — Progress Notes (Signed)
Physical Therapy Session Note  Patient Details  Name: [name] MRN: [id number] Date of Birth: 25-Oct-1955  Today's Date: 10/13/2015 PT Individual Time: 1100-1200 PT Individual Time Calculation (min): 60 min    Short Term Goals:Week 2:  PT Short Term Goal 1 (Week 2): pt will perform w/c mobility 50' in controlled environment with min A PT Short Term Goal 2 (Week 2): Pt will gait 43' with mox A in controlled environment PT Short Term Goal 3 (Week 2): Pt will consistently perform transfers with min A  Skilled Therapeutic Interventions/Progress Updates:  Handoff from RN in bathroom.  Pt completed hygiene with supervision using lateral leans and clothing management in standing with min-mod assist for balance and min multimodal cues for R weight shift in standing.  Pivot to w/c with steady assist.  Pt propelled w/c to therapy gym using a combination of UEs and LEs for NMR with min verbal cues for use of LLE.  Stand/pivot from w/c>therapy mat on pt's R with mod assist needed to pivot 2/2 increased L weight shift during transfer.  PT instructed pt in NMR activity for R weight shift, pelvic tilt, and reaching outside BOS with bench placed between knees and pt reaching to R/anterior for cups and stacking at end of bench.  Min verbal cues for reaching, trunk elongation, and R foot placement to maximize weight bearing through RLE.  PT wedged L hip to facilitate L trunk shortening as pt engaged in activity to continue to facilitate R weight shift and trunk mobility while reaching across midline with LUE to reach for objects and place in cup placed in front of pt and to R of midline.  LLE NMR side stepping to L with rail in hallway and mod assist x30'.  Pt requires mod/max multimodal cues for sequencing, hand placement, and upright posture/midline.  Pt returned to room at end of session and positioned upright in w/c with call bell in reach and needs met.   Therapy Documentation Precautions:   Precautions Precautions: Fall Precaution Comments: left hemiparesis, pusher to the left Restrictions Weight Bearing Restrictions: No   See Function Navigator for Current Functional Status.   Therapy/Group: Individual Therapy  Marlo Arriola E Penven-Crew 10/13/2015, 11:33 AM

## 2015-10-13 NOTE — Progress Notes (Signed)
Occupational Therapy Session Note  Patient Details  Name: [name] MRN: [id number] Date of Birth: [birth year]  Today's Date: 10/13/2015 OT Individual Time: 1300-1345 OT Individual Time Calculation (min): 45 min     Skilled Therapeutic Interventions/Progress Updates:    Pt worked on [employer] during session.  Worked in standing at the [employer] with emphasis on midline orientation and left knee and hip activation while using the LUE to wash the countertop and cabinet door.  Mod assist needed to maintain balance and achieve overhead washing with the LUE.  Transitioned to standing at the counter and placing items in the upper cabinets with the LUE.  She was able to complete with min assist for static standing balance and supervision for placing these lighter items in.  Transitioned to the therapy gym for further neuromuscular re-education on the LUE.  Transferred to the UE ergonometer with mod assist stand step and completed 8 mins of activity with level 5 resistance.  Two mins completed with BUEs and the rest with the LUE isolated for 3 min intervals.  RPMs maintained at less than 8 with use of the LUE with resistance set on level 5.  Pt's niece present for part of session.  Discussed with her that pt will likely need min assist at discharge.  She discussed having all bedrooms and full bath upstairs at their residence.  Discussed going ahead and making arrangements for sleeping on the lower level as pt may not being doing steps with family at discharge.  Will pass on to PT.  Pt left in room at end of session with call button in reach.   Therapy Documentation Precautions:  Precautions Precautions: Fall Precaution Comments: left hemiparesis, pusher to the left Restrictions Weight Bearing Restrictions: No  Pain: Pain Assessment Pain Assessment: 0-10 Pain Score: 7  Pain Type: Acute pain Pain Location: Hip Pain Orientation: Left Pain Descriptors / Indicators: Sore Pain  Frequency: Intermittent Pain Onset: Gradual Pain Intervention(s): Medication (See eMAR) ADL: See Function Navigator for Current Functional Status.   Therapy/Group: Individual Therapy  Brexlee Heberlein OTR/L 10/13/2015, 3:40 PM

## 2015-10-13 NOTE — Progress Notes (Signed)
Subjective/Complaints: Pt working with OT this AM.  She slept well overnight  She continues to have flat affect, but denies complaints.    ROS- Denies N/V/D, CP, SOB  Objective: Vital Signs: Blood pressure 116/67, pulse 73, temperature 98.3 F (36.8 C), temperature source Oral, resp. rate 20, weight 67.7 kg (149 lb 3.2 oz), SpO2 100 %. No results found. Results for orders placed or performed during the hospital encounter of 10/01/15 (from the past 72 hour(s))  Basic metabolic panel     Status: None   Collection Time: 10/13/15  4:55 AM  Result Value Ref Range   Sodium 142 135 - 145 mmol/L   Potassium 3.8 3.5 - 5.1 mmol/L   Chloride 109 101 - 111 mmol/L   CO2 26 22 - 32 mmol/L   Glucose, Bld 87 65 - 99 mg/dL   BUN 12 6 - 20 mg/dL   Creatinine, Ser 0.77 0.44 - 1.00 mg/dL   Calcium 9.7 8.9 - 10.3 mg/dL   GFR calc non Af Amer >60 >60 mL/min   GFR calc Af Amer >60 >60 mL/min    Comment: (NOTE) The eGFR has been calculated using the CKD EPI equation. This calculation has not been validated in all clinical situations. eGFR's persistently <60 mL/min signify possible Chronic Kidney Disease.    Anion gap 7 5 - 15  CBC with Differential/Platelet     Status: Abnormal   Collection Time: 10/13/15  4:55 AM  Result Value Ref Range   WBC 5.0 4.0 - 10.5 K/uL   RBC 4.44 3.87 - 5.11 MIL/uL   Hemoglobin 13.4 12.0 - 15.0 g/dL   HCT 41.7 36.0 - 46.0 %   MCV 93.9 78.0 - 100.0 fL   MCH 30.2 26.0 - 34.0 pg   MCHC 32.1 30.0 - 36.0 g/dL   RDW 13.0 11.5 - 15.5 %   Platelets 147 (L) 150 - 400 K/uL   Neutrophils Relative % 46 %   Neutro Abs 2.3 1.7 - 7.7 K/uL   Lymphocytes Relative 41 %   Lymphs Abs 2.1 0.7 - 4.0 K/uL   Monocytes Relative 12 %   Monocytes Absolute 0.6 0.1 - 1.0 K/uL   Eosinophils Relative 1 %   Eosinophils Absolute 0.1 0.0 - 0.7 K/uL   Basophils Relative 0 %   Basophils Absolute 0.0 0.0 - 0.1 K/uL    Gen NAD. Vital signs reviewed.  HEENT: Normocephalic, atraumatic Cardio: RRR  and no murmur Resp: CTA B/L and unlabored GI: BS positive and NT,ND Musc/Skel:  No edema.  No tenderness. Neuro: Alert. Motor 5/5 in RUE and RLE LUE: 4-/5 L delt, bi, tri, grip  LLE: 4-/5 HF KE, 3- ADF/PF  Skin:  Warm and dry. Psych: Flat affect  Assessment/Plan: 1. Functional deficits secondary to Left hemiparesis which require 3+ hours per day of interdisciplinary therapy in a comprehensive inpatient rehab setting. Physiatrist is providing close team supervision and 24 hour management of active medical problems listed below. Physiatrist and rehab team continue to assess barriers to discharge/monitor patient progress toward functional and medical goals. FIM: Function - Bathing Position: Shower Body parts bathed by patient: Right arm, Left arm, Chest, Abdomen, Front perineal area, Right upper leg, Left upper leg, Right lower leg, Left lower leg, Back Body parts bathed by helper: Buttocks Bathing not applicable: Back Assist Level: Touching or steadying assistance(Pt > 75%)  Function- Upper Body Dressing/Undressing What is the patient wearing?: Bra, Pull over shirt/dress Bra - Perfomed by patient: Thread/unthread right bra strap,  Thread/unthread left bra strap Bra - Perfomed by helper: Hook/unhook bra (pull down sports bra) Pull over shirt/dress - Perfomed by patient: Thread/unthread right sleeve, Thread/unthread left sleeve, Put head through opening, Pull shirt over trunk Pull over shirt/dress - Perfomed by helper: Thread/unthread left sleeve Assist Level: Supervision or verbal cues Set up : To obtain clothing/put away Function - Lower Body Dressing/Undressing What is the patient wearing?: Underwear, Pants, Non-skid slipper socks Position: Wheelchair/chair at sink Underwear - Performed by patient: Thread/unthread right underwear leg, Thread/unthread left underwear leg, Pull underwear up/down Underwear - Performed by helper: Pull underwear up/down Pants- Performed by patient:  Thread/unthread right pants leg, Thread/unthread left pants leg Pants- Performed by helper: Pull pants up/down Non-skid slipper socks- Performed by patient: Don/doff left sock, Don/doff right sock Non-skid slipper socks- Performed by helper: Don/doff right sock, Don/doff left sock Shoes - Performed by patient: Don/doff right shoe, Don/doff left shoe, Fasten left Shoes - Performed by helper: Fasten right TED Hose - Performed by helper: Don/doff right TED hose, Don/doff left TED hose Assist for footwear: Maximal assist Assist for lower body dressing: Touching or steadying assistance (Pt > 75%)  Function - Toileting Toileting steps completed by patient: Adjust clothing prior to toileting, Performs perineal hygiene, Adjust clothing after toileting Toileting steps completed by helper: Adjust clothing prior to toileting Toileting Assistive Devices: Grab bar or rail Assist level: Touching or steadying assistance (Pt.75%)  Function - Toilet Transfers Toilet transfer assistive device: Grab bar Assist level to toilet: Touching or steadying assistance (Pt > 75%) Assist level from toilet: Touching or steadying assistance (Pt > 75%) Assist level to bedside commode (at bedside): Moderate assist (Pt 50 - 74%/lift or lower) Assist level from bedside commode (at bedside): Moderate assist (Pt 50 - 74%/lift or lower)  Function - Chair/bed transfer Chair/bed transfer method: Squat pivot Chair/bed transfer assist level: Touching or steadying assistance (Pt > 75%) Chair/bed transfer assistive device: Armrests, Bedrails Chair/bed transfer details: Verbal cues for technique, Verbal cues for sequencing, Verbal cues for precautions/safety  Function - Locomotion: Wheelchair Will patient use wheelchair at discharge?: Yes Type: Manual Max wheelchair distance: 100 Assist Level: Touching or steadying assistance (Pt > 75%) Wheel 50 feet with 2 turns activity did not occur: Safety/medical concerns Assist Level:  Touching or steadying assistance (Pt > 75%) Wheel 150 feet activity did not occur: Safety/medical concerns Turns around,maneuvers to table,bed, and toilet,negotiates 3% grade,maneuvers on rugs and over doorsills: No Function - Locomotion: Ambulation Assistive device: Other (comment), Orthosis (weighted grocery cart) Max distance: 50 Assist level: Moderate assist (Pt 50 - 74%) Assist level: Moderate assist (Pt 50 - 74%) Assist level: Moderate assist (Pt 50 - 74%) Walk 10 feet on uneven surfaces activity did not occur: Safety/medical concerns  Function - Comprehension Comprehension: Auditory Comprehension assist level: Follows complex conversation/direction with extra time/assistive device  Function - Expression Expression: Verbal Expression assist level: Expresses complex 90% of the time/cues < 10% of the time  Function - Social Interaction Social Interaction assist level: Interacts appropriately 90% of the time - Needs monitoring or encouragement for participation or interaction.  Function - Problem Solving Problem solving assist level: Solves basic 90% of the time/requires cueing < 10% of the time  Function - Memory Memory assist level: Recognizes or recalls 90% of the time/requires cueing < 10% of the time Patient normally able to recall (first 3 days only): Current season, Location of own room, Staff names and faces, That he or she is in a hospital  Medical  Problem List and Plan: 1.  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  DVT Prophylaxis/Anticoagulation: Subcutaneous Lovenox. Monitor platelet counts in signs of bleeding 3. Pain Management/chronic back pain. 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 Mood/PTSD. Zoloft 50 mg daily, Requip 0.25 mg 3 times a day 5. Neuropsych: This patient is capable of making decisions on her own behalf. 6. Skin/Wound Care: Routine skin checks, prob tinea pedis R foot started clotrimazole BID 7. Fluids/Electrolytes/Nutrition: Routine I&O's 8. Tobacco alcohol abuse. Counseling. No nicotine craving reported 9. History of GI bleed. Continue Pepcid twice a day.   -Hb 13.4 on 8/1 10.  Hx Alcohol abuse denies ETOH for ~43mo11. 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  Constipation- improved  senna S 13. Sleep disturbance: prn trazodone  LOS (Days) 12 A FACE TO FACE EVALUATION WAS PERFORMED  Ankit ALorie Phenix8/03/2015, 8:41 AM

## 2015-10-13 NOTE — Progress Notes (Signed)
Speech Language Pathology Daily Session Note  Patient Details  Name: [name] MRN: [id number] Date of Birth: 15-Feb-1956  Today's Date: 10/13/2015 SLP Individual Time: 0947-0962 SLP Individual Time Calculation (min): 48 min   Short Term Goals: Week 2: SLP Short Term Goal 1 (Week 2): Pt will demonstrate functional problem solving for moderately complex tasks with mod A verbal cues.  SLP Short Term Goal 2 (Week 2): Pt will demonstrate anticipatory awareness and identify 3 tasks she can participate in at home safely with Min A verbal cues.  SLP Short Term Goal 3 (Week 2): Pt will utilize external memory aids to recall novel detailed information with Min A verbal and question cues.   Skilled Therapeutic Interventions:  Pt was seen for skilled ST targeting cognitive goals.  SLP facilitated the session with a previously taught card game to address recall of novel information.  Pt recalled 2 out of 3 components of game independently which improved to 3 out of 3 recall with supervision question cues.  Pt required min assist verbal cues to plan and execute a problem solving strategy during the abovementioned task due to decreased working memory and mental flexibility.  Pt was also able to recall ~50% of currently scheduled medications with min assist question cues.  Pt was returned to room and left with call bell within reach and quick release belt donned for safety.  Continue per current plan of care.    Function:  Eating Eating                 Cognition Comprehension Comprehension assist level: Follows basic conversation/direction with no assist  Expression   Expression assist level: Expresses basic needs/ideas: With no assist  Social Interaction Social Interaction assist level: Interacts appropriately 90% of the time - Needs monitoring or encouragement for participation or interaction.  Problem Solving Problem solving assist level: Solves basic 75 - 89% of the time/requires cueing 10 -  24% of the time  Memory Memory assist level: Recognizes or recalls 75 - 89% of the time/requires cueing 10 - 24% of the time    Pain Pain Assessment Pain Assessment: No/denies pain  Therapy/Group: Individual Therapy  Antoni Stefan, Melanee Spry 10/13/2015, 2:45 PM

## 2015-10-13 NOTE — Progress Notes (Signed)
Social Work  Patient ID: [name], female   DOB: 1955/06/22, 60 y.o.   MRN: [id number]   Spoke with [name]-niece who wants to know if pt can stay longer so the family can go to a wedding in [place]. She feels it would be too much for pt. Informed her can ask in team conference tomorrow. Will ask team if pt could benefit from staying longer and to reach her goals. Will get back with niece tomorrow after conference.

## 2015-10-13 NOTE — Progress Notes (Signed)
Occupational Therapy Session Note  Patient Details  Name: [name] MRN: [id number] Date of Birth: December 27, 1955  Today's Date: 10/13/2015 OT Individual Time: 0800-0900 OT Individual Time Calculation (min): 60 min     Short Term Goals: Week 2:  OT Short Term Goal 1 (Week 2): Pt will complete toilet transfers with min assist using LRAD. OT Short Term Goal 2 (Week 2): Pt will perform shower transfers with min assist to walk-in shower. OT Short Term Goal 3 (Week 2): Pt will complete stand pivot toilet transfers with min assist to the 3:1.   OT Short Term Goal 4 (Week 2): Pt will perform LB dressing with min assist sit to stand.  OT Short Term Goal 5 (Week 2): Pt will use the LUE as an active assist for pulling up pants and tying shoes with supervision.   Skilled Therapeutic Interventions/Progress Updates:    Pt completed shower and dressing during session.  Mod assist needed for all stand pivot transfers with decreased ability to maintain left knee extension with weightbearing.  She still demonstrates increased pushing to the left side in standing with increased left trunk passive elongation, and cervical flexion to the right.  Mod assist for standing balance when completing pulling underpants and pants over hips.  She was able to donn her shoes this session and tie them with increased time.  Completed grooming tasks at the sink from wheelchair level as therapist concluded session.  Half lap tray in place. Therapy Documentation Precautions:  Precautions Precautions: Fall Precaution Comments: left hemiparesis, pusher to the left Restrictions Weight Bearing Restrictions: No  Pain: Pain Assessment Pain Assessment: No/denies pain ADL: See Function Navigator for Current Functional Status.   Therapy/Group: Individual Therapy  Raffi Milstein OTR/L 10/13/2015, 12:09 PM

## 2015-10-14 ENCOUNTER — Inpatient Hospital Stay (HOSPITAL_COMMUNITY): Payer: Medicaid Other | Admitting: Physical Therapy

## 2015-10-14 ENCOUNTER — Inpatient Hospital Stay (HOSPITAL_COMMUNITY): Payer: Medicaid Other | Admitting: Speech Pathology

## 2015-10-14 ENCOUNTER — Inpatient Hospital Stay (HOSPITAL_COMMUNITY): Payer: Medicaid Other | Admitting: Occupational Therapy

## 2015-10-14 NOTE — Progress Notes (Signed)
Subjective/Complaints: Pt working with PT, rolling her wheelchair this AM.    ROS: Denies N/V/D, CP, SOB  Objective: Vital Signs: Blood pressure 109/61, pulse 76, temperature 97.9 F (36.6 C), temperature source Oral, resp. rate 18, weight 67.7 kg (149 lb 3.2 oz), SpO2 97 %. No results found. Results for orders placed or performed during the hospital encounter of 10/01/15 (from the past 72 hour(s))  Basic metabolic panel     Status: None   Collection Time: 10/13/15  4:55 AM  Result Value Ref Range   Sodium 142 135 - 145 mmol/L   Potassium 3.8 3.5 - 5.1 mmol/L   Chloride 109 101 - 111 mmol/L   CO2 26 22 - 32 mmol/L   Glucose, Bld 87 65 - 99 mg/dL   BUN 12 6 - 20 mg/dL   Creatinine, Ser 0.77 0.44 - 1.00 mg/dL   Calcium 9.7 8.9 - 10.3 mg/dL   GFR calc non Af Amer >60 >60 mL/min   GFR calc Af Amer >60 >60 mL/min    Comment: (NOTE) The eGFR has been calculated using the CKD EPI equation. This calculation has not been validated in all clinical situations. eGFR's persistently <60 mL/min signify possible Chronic Kidney Disease.    Anion gap 7 5 - 15  CBC with Differential/Platelet     Status: Abnormal   Collection Time: 10/13/15  4:55 AM  Result Value Ref Range   WBC 5.0 4.0 - 10.5 K/uL   RBC 4.44 3.87 - 5.11 MIL/uL   Hemoglobin 13.4 12.0 - 15.0 g/dL   HCT 41.7 36.0 - 46.0 %   MCV 93.9 78.0 - 100.0 fL   MCH 30.2 26.0 - 34.0 pg   MCHC 32.1 30.0 - 36.0 g/dL   RDW 13.0 11.5 - 15.5 %   Platelets 147 (L) 150 - 400 K/uL   Neutrophils Relative % 46 %   Neutro Abs 2.3 1.7 - 7.7 K/uL   Lymphocytes Relative 41 %   Lymphs Abs 2.1 0.7 - 4.0 K/uL   Monocytes Relative 12 %   Monocytes Absolute 0.6 0.1 - 1.0 K/uL   Eosinophils Relative 1 %   Eosinophils Absolute 0.1 0.0 - 0.7 K/uL   Basophils Relative 0 %   Basophils Absolute 0.0 0.0 - 0.1 K/uL    Gen: NAD. Vital signs reviewed.  HEENT: Normocephalic, atraumatic Cardio: RRR and no murmur Resp: CTA B/L and unlabored GI: BS positive  and NT,ND Musc/Skel:  No edema.  No tenderness. Neuro: Alert. Motor 5/5 in RUE and RLE LUE: 4-/5 L delt, bi, tri, grip  LLE: 4-/5 HF KE, 3- ADF/PF  Skin:  Warm and dry. Psych: Flat affect  Assessment/Plan: 1. Functional deficits secondary to Left hemiparesis which require 3+ hours per day of interdisciplinary therapy in a comprehensive inpatient rehab setting. Physiatrist is providing close team supervision and 24 hour management of active medical problems listed below. Physiatrist and rehab team continue to assess barriers to discharge/monitor patient progress toward functional and medical goals. FIM: Function - Bathing Position: Shower Body parts bathed by patient: Right arm, Left arm, Chest, Abdomen, Right upper leg, Left upper leg, Right lower leg, Left lower leg, Back Body parts bathed by helper: Front perineal area, Buttocks Bathing not applicable: Back Assist Level: Touching or steadying assistance(Pt > 75%)  Function- Upper Body Dressing/Undressing What is the patient wearing?: Bra, Pull over shirt/dress Bra - Perfomed by patient: Thread/unthread right bra strap, Thread/unthread left bra strap Bra - Perfomed by helper: Hook/unhook bra (pull  down sports bra) Pull over shirt/dress - Perfomed by patient: Thread/unthread right sleeve, Thread/unthread left sleeve, Put head through opening, Pull shirt over trunk Pull over shirt/dress - Perfomed by helper: Thread/unthread left sleeve Assist Level: Supervision or verbal cues Set up : To obtain clothing/put away Function - Lower Body Dressing/Undressing What is the patient wearing?: Underwear, Pants, Shoes, Ted Hose Position: Wheelchair/chair at [employer] - Performed by patient: Thread/unthread right underwear leg, Thread/unthread left underwear leg Underwear - Performed by helper: Pull underwear up/down Pants- Performed by patient: Thread/unthread right pants leg, Thread/unthread left pants leg, Fasten/unfasten pants Pants-  Performed by helper: Pull pants up/down Non-skid slipper socks- Performed by patient: Don/doff left sock, Don/doff right sock Non-skid slipper socks- Performed by helper: Don/doff right sock, Don/doff left sock Shoes - Performed by patient: Don/doff right shoe, Don/doff left shoe, Fasten right, Fasten left Shoes - Performed by helper: Fasten right TED Hose - Performed by helper: Don/doff right TED hose, Don/doff left TED hose Assist for footwear: Maximal assist Assist for lower body dressing: Touching or steadying assistance (Pt > 75%)  Function - Toileting Toileting steps completed by patient: Adjust clothing prior to toileting, Performs perineal hygiene Toileting steps completed by helper: Adjust clothing prior to toileting Toileting Assistive Devices: Grab bar or rail Assist level:  (mod assist for standing balance with clothing management)  Function - Toilet Transfers Toilet transfer assistive device: Grab bar Assist level to toilet: Touching or steadying assistance (Pt > 75%) Assist level from toilet: Touching or steadying assistance (Pt > 75%) Assist level to bedside commode (at bedside): Moderate assist (Pt 50 - 74%/lift or lower) Assist level from bedside commode (at bedside): Moderate assist (Pt 50 - 74%/lift or lower)  Function - Chair/bed transfer Chair/bed transfer method: Squat pivot, Stand pivot Chair/bed transfer assist level: Moderate assist (Pt 50 - 74%/lift or lower) Chair/bed transfer assistive device: Armrests Chair/bed transfer details: Verbal cues for technique, Verbal cues for sequencing, Verbal cues for precautions/safety, Manual facilitation for weight shifting  Function - Locomotion: Wheelchair Will patient use wheelchair at discharge?: Yes Type: Manual Max wheelchair distance: 150 Assist Level: Supervision or verbal cues Wheel 50 feet with 2 turns activity did not occur: Safety/medical concerns Assist Level: Supervision or verbal cues Wheel 150 feet  activity did not occur: Safety/medical concerns Assist Level: Supervision or verbal cues Turns around,maneuvers to table,bed, and toilet,negotiates 3% grade,maneuvers on rugs and over doorsills: No Function - Locomotion: Ambulation Assistive device: Rail in hallway Max distance: 30 (side stepping L) Assist level: Moderate assist (Pt 50 - 74%) Assist level: Moderate assist (Pt 50 - 74%) Assist level: Moderate assist (Pt 50 - 74%) Walk 10 feet on uneven surfaces activity did not occur: Safety/medical concerns  Function - Comprehension Comprehension: Auditory Comprehension assist level: Follows complex conversation/direction with no assist  Function - Expression Expression: Verbal Expression assist level: Expresses complex ideas: With no assist  Function - Social Interaction Social Interaction assist level: Interacts appropriately with others with medication or extra time (anti-anxiety, antidepressant).  Function - Problem Solving Problem solving assist level: Solves basic 75 - 89% of the time/requires cueing 10 - 24% of the time  Function - Memory Memory assist level: Recognizes or recalls 75 - 89% of the time/requires cueing 10 - 24% of the time Patient normally able to recall (first 3 days only): Current season, Location of own room, Staff names and faces, That he or she is in a hospital  Medical Problem List and Plan: 1.  Left side  weakness secondary to nonhemorrhagic infarct posterior right corona radiata  -cont CIR  -AFO  -left heel cord tight--- stretch more aggressively 2.  DVT Prophylaxis/Anticoagulation: Subcutaneous Lovenox. Monitor platelet counts in signs of bleeding 3. Pain Management/chronic back pain. Flexeril 5 mg twice a day, baclofen 10 mg twice a day, Neurontin 300 mg 3 times a day and Percocet as needed.Chronic narcotics as outpt , added kpad for back    -Hip pain ?glut medius syndrome- Xray is neg, some improvement with  lidoderm patch, Pt leans to Left while  sitting, improving   - tramadol for moderate pain and oxycodone for severe pain 4. Mood/PTSD. Zoloft 50 mg daily, Requip 0.25 mg 3 times a day 5. Neuropsych: This patient is capable of making decisions on her own behalf. 6. Skin/Wound Care: Routine skin checks, prob tinea pedis R foot started clotrimazole BID 7. Fluids/Electrolytes/Nutrition: Routine I&O's 8. Tobacco alcohol abuse. Counseling. No nicotine craving reported 9. History of GI bleed. Continue Pepcid twice a day.   -Hb 13.4 on 8/1 10.  Hx Alcohol abuse denies ETOH for ~53mo11. 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  Constipation- improved  senna S 13. Sleep disturbance: prn trazodone  LOS (Days) 13 A FACE TO FACE EVALUATION WAS PERFORMED  Ankit ALorie Phenix8/04/2015, 8:37 AM

## 2015-10-14 NOTE — Progress Notes (Signed)
Social Work Patient ID: [name], female   DOB: Oct 09, 1955, 60 y.o.   MRN: [id number]   Met with pt and spoke with Niece-[name] to discuss team conference progress toward her goals and discharge extended to 8/14. The goals will be upgraded to supervision level and pt can work more on stairs due to bedroom and bathroom on the second floor. Pt is fine to stay as long as the team feel she needs too. She wants to be as Independent as possible before going home. She prides herself on her independence. She knows she will be missing her nephew's wedding but it is ok as long as she is doing what she needs to do to improve. She knows they will be taking pictures and videos for her to see. Will continue to work on discharge plans and have niece-[name] come in to do family education prior to her leaving for [employer].

## 2015-10-14 NOTE — Progress Notes (Signed)
Physical Therapy Session Note  Patient Details  Name: [name] MRN: [id number] Date of Birth: 1956-03-13  Today's Date: 10/14/2015 PT Individual Time: 0800-0915 PT Individual Time Calculation (min): 75 min    Short Term Goals: Week 1:  PT Short Term Goal 1 (Week 1): Pt will consistently perform functional transfers with mod A PT Short Term Goal 1 - Progress (Week 1): Met PT Short Term Goal 2 (Week 1): Pt will gait in controlled environment with max A 25' PT Short Term Goal 2 - Progress (Week 1): Met PT Short Term Goal 3 (Week 1): Pt will negotiate 4 stairs with max A PT Short Term Goal 3 - Progress (Week 1): Progressing toward goal Week 2:  PT Short Term Goal 1 (Week 2): pt will perform w/c mobility 50' in controlled environment with min A PT Short Term Goal 2 (Week 2): Pt will gait 7' with mox A in controlled environment PT Short Term Goal 3 (Week 2): Pt will consistently perform transfers with min A Week 3:     Skilled Therapeutic Interventions/Progress Updates:    Patient received sitting EOB requesting to utilize bathroom for bowel movement. PT assisted patient to don ted hose and grip socks sitting EOB with max A for time management. Stand pivot transfer to [facility] from bed with mod A from PT with max cues for improved WB through R LE to over come pushing tendencies. Stand pivot toilet transfer x 2 with mod A from PT using rails with cues as listed above. Patient performed pernieal hygine with supervision A and cue for lateral leans to L .  WC mobility in hall for 181f with supervision A from PT as well as multimodal cues for increased use of LUE to equalize force and maintain straight trajectory.   Nustep endurance/reciprocal movement training x 10 minutes, 4 min level2, 6 min level 6. Mod cues from PT for improved posture sitting in nustep and equalized pressure through BLE to improve reciprocal movement.   Patient instructed in NMR for seated lateral reaches L and R followd by  cross body reaches x 8 BUE. PT provided min A for improved trunk closure on the L side and improved elongation on the R side mod cues also provided to improve WB through R LE to decrease pushing tendencies.  Standing tolerance with 2 inch step under LLE to increase WB through R LE. 1 minute x 2 with up to 20 seconds with superivsion A only from PT. Mod multimodal cues throughout standing balance for improved trunk alignment and weight shifting over RLE.  Standing tolerance progressed to level surface. 1 minutes x 2 with use of mirror feedback on second bout. Mod cues for improved WB through R LE, increased L knee extension, and improved R sided trunk elongation.  Lateral reaches to R in standing PT provided  mod A and max cues for improved L LE gluteal activation, increased WB through R LE, and increased trunk elongation on the R side.   [facility] and quick release belt in place.        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   Therapy/Group: Individual Therapy  ALorie Phenix8/04/2015, 12:10 PM

## 2015-10-14 NOTE — Patient Care Conference (Signed)
Inpatient RehabilitationTeam Conference and Plan of Care Update Date: 10/14/2015   Time: 11:45 AM    Patient Name: [name]      Medical Record Number: [id number]  Date of Birth: 13-Feb-1956 Sex: Female         Room/Bed: 4M13C/4M13C-01 Payor Info: Payor: MEDICAID [place] / Plan: MEDICAID [place] ACCESS / Product Type: *No Product type* /    Admitting Diagnosis: Stroke   Admit Date/Time:  10/01/2015  5:35 PM Admission Comments: No comment available   Primary Diagnosis:  Left-sided weakness Principal Problem: Left-sided weakness  Patient Active Problem List   Diagnosis Date Noted  . History of GI bleed   . Spastic bladder   . Chronic back pain   . ETOH abuse   . Hypokalemia   . PTSD (post-traumatic stress disorder)   . Gastroesophageal reflux disease without esophagitis   . Acute ischemic stroke (HCC) 09/27/2015  . Acute CVA (cerebrovascular accident) (HCC) 09/27/2015  . Tobacco abuse   . Left-sided weakness 09/26/2015  . Acute gastric ulcer   . GI bleed 05/22/2014  . Acute GI bleeding 05/22/2014  . Acute blood loss anemia 05/22/2014  . History of CVA (cerebrovascular accident) 05/22/2014  . Hyperlipidemia 05/22/2014  . Gastrointestinal hemorrhage with melena   . CVA (cerebral infarction) 10/09/2012  . Flank pain 10/09/2012  . Right pontine stroke (HCC) 10/09/2012  . ABDOMINAL PAIN OTHER SPECIFIED SITE 05/17/2010  . ALCOHOL ABUSE 06/16/2007  . COCAINE ABUSE 06/16/2007  . DEPRESSION 06/16/2007  . Essential hypertension 06/16/2007  . CEREBROVASCULAR ACCIDENT 06/16/2007  . ASTHMA 06/16/2007  . GERD 06/16/2007  . PANCREATITIS 11/09/2006  . ESOPHAGITIS, REFLUX 05/23/2006    Expected Discharge Date: Expected Discharge Date: 10/26/15  Team Members Present: Physician leading conference: Dr. Maryla Morrow Social Worker Present: Dossie Der, LCSW Nurse Present: Kennon Portela, RN PT Present: Katherine Mantle, PT OT Present: Rosalio Loud, OT SLP Present: Fae Pippin, SLP PPS  Coordinator present : Edson Snowball, PT     Current Status/Progress Goal Weekly Team Focus  Medical   Left side weakness secondary to nonhemorrhagic infarct posterior right corona radiata  Improve safety, mobility, transfers, urinary incontinence  See above   Bowel/Bladder   Continent of bowel and bladder with urgency/ LBM 10/19/15  remain continent of bowel and bladder  continue to monitor q shift   Swallow/Nutrition/ Hydration             ADL's   supervision for UB bathing with min assist for dressing.  Min to mod assist for LB bathing and dressing with mod assist for stand pivot transfers.  Brunstrum stage V in the arm and hand.  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   supervsion to min assist  selfcare re-training, neuromuscular re-educaton, transfer training, balance re-training, therapeutic exercise, pt/family education.   Mobility   mod A funcitonal mobility, improved endurance  supervision transfers, min A gait  functional mobility, strengthening, endurance, balance, safety/education   Communication   supervision-mod I for intelligibility in conversations   mod I   continue to address use of compensatory strategies   Safety/Cognition/ Behavioral Observations  min assist  Mod I for basic   continue to monitor q shift   Pain   complaints of Chronic back pain (lidoderm patch to right pain.  <3  continue to monitor q shift.   Skin   no skin breakdown/ Fungus to left food  min assist q shift  continue to monitor for pain q shift      *See Care Plan and progress notes for long and short-term goals.  Barriers to Discharge: Safety, mobility, transfers, urinary incontinence     Possible Resolutions to Barriers:  Therapies, adjust urinary meds    Discharge Planning/Teaching Needs:  Niece and family will not be available until 8/14, pt is benefiting from rehab and can modify goals to supervision level. Making good progress in therapies.      Team Discussion:  Making good progress in therapies, still pushing to the left. Can update goals to supervision level and will work on steps since bedroom and bathroom is on the second floor. Speech to decrease to 3 x week due to reaching goals. Will do family education prior to discharge.  Revisions to Treatment Plan:  Upgraded goals to supervision level-Speech to decrease to 3 x week   Continued Need for Acute Rehabilitation Level of Care: The patient requires daily medical management by a physician with specialized training in physical medicine and rehabilitation for the following conditions: Daily direction of a multidisciplinary physical rehabilitation program to ensure safe treatment while eliciting the highest outcome that is of practical value to the patient.: Yes Daily medical management of patient stability for increased activity during participation in an intensive rehabilitation regime.: Yes Daily analysis of laboratory values and/or radiology reports with any subsequent need for medication adjustment of medical intervention for : Neurological problems;Other;Urological problems  Gilmar Bua, Lemar Livings 10/14/2015, 3:06 PM

## 2015-10-14 NOTE — Progress Notes (Signed)
Speech Language Pathology Daily Session Note  Patient Details  Name: [name] MRN: [id number] Date of Birth: 11/11/1955  Today's Date: 10/14/2015 SLP Individual Time: 1030-1130 SLP Individual Time Calculation (min): 60 min   Short Term Goals: Week 2: SLP Short Term Goal 1 (Week 2): Pt will demonstrate functional problem solving for moderately complex tasks with mod A verbal cues.  SLP Short Term Goal 2 (Week 2): Pt will demonstrate anticipatory awareness and identify 3 tasks she can participate in at home safely with Min A verbal cues.  SLP Short Term Goal 3 (Week 2): Pt will utilize external memory aids to recall novel detailed information with Min A verbal and question cues.   Skilled Therapeutic Interventions:   Skilled treatment session focused on addressing cognition goals. Patient able to identify two safe household tasks that she can perform now with current deficits Mod I.  SLP facilitated session by providing Min verbal cues to identify third task.  Patient required Supervision verbal cues faded to Mod I to problem solve tasks: folding laundry, washing bathroom sink/counter, and dusting in her room.   Continue with current plan of care.   Function:  Cognition Comprehension Comprehension assist level: Follows basic conversation/direction with no assist  Expression   Expression assist level: Expresses basic needs/ideas: With no assist  Social Interaction Social Interaction assist level: Interacts appropriately with others with medication or extra time (anti-anxiety, antidepressant).  Problem Solving Problem solving assist level: Solves basic 90% of the time/requires cueing < 10% of the time  Memory Memory assist level: Recognizes or recalls 90% of the time/requires cueing < 10% of the time    Pain Pain Assessment Pain Assessment: No/denies pain  Therapy/Group: Individual Therapy  Charlane Ferretti., CCC-SLP [phone]  Nami Strawder 10/14/2015, 4:11 PM

## 2015-10-14 NOTE — Progress Notes (Addendum)
Occupational Therapy Session Note  Patient Details  Name: [name] MRN: [id number] Date of Birth: Oct 10, 1955  Today's Date: 10/14/2015 OT Individual Time: 1300-1416 OT Individual Time Calculation (min): 76 min   Skilled Therapeutic Interventions/Progress Updates:    Pt greeted seated in wheelchair in pt room. Pt propelled wheelchair to therapy gym with verbal cues for technique. Pt then stood from w/c at therapy mat and was assisted into quadruped position where she completed neuro-muscular re-education activities incorporating weight shifting and motor planning. Pt required assist to maintain L elbow extension, but responded well to proprioceptive feedback. Pt then came to sitting at edge of mat and was assisted into standing, to complete lateral weight shift and reaching activity. Pt required verbal and tactile cues, as well as L knee block to maintain posture while reaching to R side, w/ increased pushing to L noted with increased fatigue. Pt took seated rest break, then stood with assistance for placement of cushion under L buttocks to improve postural control while laterally reaching for neuro-muscular re-education. Pt completed stand-pivot transfer back to w/c and was brought back to her room. Pt left with needs met, safety belt in place, and call bell within reach.   Therapy Documentation Precautions:  Precautions Precautions: Fall Precaution Comments: left hemiparesis, pusher to the left Restrictions Weight Bearing Restrictions: No    Pain: Pain Assessment Pain Assessment: No/denies pain Pain Score: 0  ADL:  See Function Navigator for Current Functional Status.   Therapy/Group: Individual Therapy  Dalphine Handing Sitton OTR/L 10/14/2015, 4:00 PM

## 2015-10-15 ENCOUNTER — Inpatient Hospital Stay (HOSPITAL_COMMUNITY): Payer: Medicaid Other | Admitting: Occupational Therapy

## 2015-10-15 ENCOUNTER — Inpatient Hospital Stay (HOSPITAL_COMMUNITY): Payer: Medicaid Other | Admitting: Speech Pathology

## 2015-10-15 ENCOUNTER — Inpatient Hospital Stay (HOSPITAL_COMMUNITY): Payer: Medicaid Other

## 2015-10-15 DIAGNOSIS — M25552 Pain in left hip: Secondary | ICD-10-CM

## 2015-10-15 DIAGNOSIS — F1010 Alcohol abuse, uncomplicated: Secondary | ICD-10-CM

## 2015-10-15 LAB — CREATININE, SERUM
Creatinine, Ser: 0.82 mg/dL (ref 0.44–1.00)
GFR calc Af Amer: >60 mL/min
GFR calc non Af Amer: >60 mL/min

## 2015-10-15 NOTE — Discharge Summary (Addendum)
Physician Discharge Summary  [name] ZOX:[id number] DOB: 1956/01/05 DOA: 09/26/2015  PCP: Lonia Blood, MD  Admit date: 09/26/2015 Discharge date: 10/15/2015  Time spent: 35 minutes  Recommendations for Outpatient Follow-up:  1.   Discharge Diagnoses:  Principal Problem:   Acute ischemic stroke [facility]) Active Problems:   Essential hypertension   GERD   Hyperlipidemia   Left-sided weakness   Tobacco abuse   Acute CVA (cerebrovascular accident) (HCC)   Chronic back pain   ETOH abuse   Hypokalemia   PTSD (post-traumatic stress disorder)   Gastroesophageal reflux disease without esophagitis   Discharge Condition: stable  Diet recommendation: heart healthy  Filed Weights   09/26/15 2117 09/27/15 0102  Weight: 62.1 kg (137 lb) 62 kg (136 lb 9.6 oz)    History of present illness:  This is a 61 year old female who was last seen normal approximately 10 AM today. She states she developed the severe back and hip pain in the early afternoon. As the day progressed she developed progressive weakness of the leg and began dragging her left leg. She reports no altered mentation but she reports some slurred speech. She denies dysphagia or altered mentation. Her symptoms worsened specifically or lower extremity weakness. Her daughters visited in the evening him and noted the weakness and took her to the ER. Of note the patient does have history of a stroke with residual left-sided weakness, she is not daily aspirin. During my interview the patient had no complaint of back pain  Hospital Course:   Acute right brain stroke: Right corona radiata infarct secondary to small vessel disease source. - Resultant left facial droop, dysarthria and left hemiparesis. - CT brain 09/26/15: No acute intracranial pathology. - MRI head 09/27/15:16 mm acute ischemic nonhemorrhagic infarct involving the posterior right coronary data. No associated mass effect. Multiple remote lacunar infarcts involving bilateral  external capsule/basal ganglia, bilateral thalami and right paramedian pons. - MRA head 09/27/15: Negative for large vessel occlusion. Atheromatous irregularity with superimposed mild to moderate multifocal stenosis. - Carotid Dopplers: Bilateral: No significant (1-39 percent) ICA stenosis. Antegrade vertebral flow. - 2-D echo: LVEF 55% and grade 2 diastolic dysfunction. No significant valvular abnormalities. - Hemoglobin A1c: 5.3 - LDL: 215. - Not on antithrombotic prior to admission. As per neurology follow-up, she was on aspirin in the past but taken off due to GI bleed caused by gastric ulcer. Therefore switched to Plavix for secondary stroke prevention. - PT and OT recommend CIR - moved to IP rehab - Pt will follow up with Dr. Roda Shutters at [facility] in about 2 months.   Hyperlipidemia - LDL 215, goal <70. Started atorvastatin 80 mg daily. Check LFTs in 4 weeks.  Possible 3 mm small aneurysm or tortuosity of the proximal right AICA, seen on MRA head - Outpatient follow-up as deemed necessary.  Procedures: ECHO 09/27/15 Impressions:  - Normal LV size with EF 55%. Moderate diastolic dysfunction.   Normal RV size and systolic function. No significant valvular abnormalities.   Carotid Doppler 09/27/15: Preliminary findings: Bilateral: No significant (1-39%) ICA stenosis. Antegrade vertebral flow.    Consultations:  Neuro, PMR  Discharge Exam: Vitals:   10/01/15 1011 10/01/15 1351  BP: (!) 107/56 110/88  Pulse: 72 71  Resp: 17 17  Temp: 98.2 F (36.8 C) 98.3 F (36.8 C)    General exam: Pleasant middle-aged female sitting up comfortably in chair this morning.  Respiratory system: Clear to auscultation. Respiratory effort normal. Cardiovascular system: S1 & S2 heard, RRR. No JVD, murmurs, rubs,  gallops or clicks. No pedal edema. Gastrointestinal system: Abdomen is nondistended, soft and nontender. No organomegaly or masses felt. Normal bowel sounds heard. Central nervous system: Alert  and oriented. Mild dysarthria. Facial asymmetry +, Seems better. Extremities: Grade 5 x 5 power in right limbs. Grade 3 x 5 power in left upper extremity and 2 x 5 power in left lower extremity. Skin: No rashes, lesions or ulcers Psychiatry: Judgement and insight appear normal. Mood & affect flat.    Discharge Instructions   Discharge Instructions    Ambulatory referral to Neurology    Complete by:  As directed   Pt will follow up with Dr. Roda Shutters at [facility] in about 2 months. Thanks.     Discharge Medication List as of 10/01/2015  5:25 PM    CONTINUE these medications which have NOT CHANGED   Details  albuterol (PROVENTIL HFA;VENTOLIN HFA) 108 (90 BASE) MCG/ACT inhaler Inhale 2 puffs into the lungs every 8 (eight) hours as needed for wheezing or shortness of breath., Until Discontinued, Historical Med    baclofen (LIORESAL) 10 MG tablet Take 10 mg by mouth 2 (two) times daily., Until Discontinued, Historical Med    cyclobenzaprine (FLEXERIL) 5 MG tablet Take 1 tablet (5 mg total) by mouth 2 (two) times daily., Starting 05/24/2014, Until Discontinued, Print    cycloSPORINE (RESTASIS) 0.05 % ophthalmic emulsion Place 1 drop into both eyes 2 (two) times daily., Until Discontinued, Historical Med    doxycycline (VIBRAMYCIN) 100 MG capsule Take 100 mg by mouth 2 (two) times daily., Until Discontinued, Historical Med    fluticasone (FLONASE) 50 MCG/ACT nasal spray Place 1 spray into the nose daily as needed for allergies. , Starting 06/04/2015, Until Discontinued, Historical Med    gabapentin (NEURONTIN) 300 MG capsule Take 300 mg by mouth 3 (three) times daily. , Starting 02/17/2014, Until Discontinued, Historical Med    loratadine (CLARITIN) 10 MG tablet Take 10 mg by mouth daily., Starting 04/03/2014, Until Discontinued, Historical Med    oxyCODONE-acetaminophen (PERCOCET) 7.5-325 MG per tablet Take 1 tablet by mouth every 4 (four) hours as needed for pain. , Starting 05/01/2014, Until  Discontinued, Historical Med    pantoprazole (PROTONIX) 40 MG tablet Take 1 tablet (40 mg total) by mouth 2 (two) times daily before a meal., Starting 05/24/2014, Until Discontinued, Print    rOPINIRole (REQUIP) 0.25 MG tablet Take 0.25 mg by mouth 3 (three) times daily., Until Discontinued, Historical Med    sertraline (ZOLOFT) 50 MG tablet Take 50 mg by mouth daily., Until Discontinued, Historical Med    SIMBRINZA 1-0.2 % SUSP Place 1 drop into both eyes 3 (three) times daily. , Starting 05/08/2014, Until Discontinued, Historical Med    temazepam (RESTORIL) 15 MG capsule Take 15 mg by mouth at bedtime., Until Discontinued, Historical Med    Travoprost, BAK Free, (TRAVATAN) 0.004 % SOLN ophthalmic solution Place 1 drop into both eyes at bedtime. , Until Discontinued, Historical Med       Allergies  Allergen Reactions  . Banana Other (See Comments)    Welts on skin and sores in mouth  . Ibuprofen Other (See Comments)    blisters  . Penicillins Other (See Comments)    Blister Has patient had a PCN reaction causing immediate rash, facial/tongue/throat swelling, SOB or lightheadedness with hypotension: no Has patient had a PCN reaction causing severe rash involving mucus membranes or skin necrosis: yes - blisters Has patient had a PCN reaction that required hospitalization no Has patient had a PCN  reaction occurring within the last 10 years: no If all of the above answers are "NO", then may proceed with Cephalosporin use.    Follow-up Information    Xu,Jindong, MD. Schedule an appointment as soon as possible for a visit in 2 months.   Specialty:  Neurology Why:  stroke clinic Contact information: [address] [place] [address] [phone]            The results of significant diagnostics from this hospitalization (including imaging, microbiology, ancillary and laboratory) are listed below for reference.    Significant Diagnostic Studies: Ct Head Wo  Contrast  Result Date: 09/26/2015 CLINICAL DATA:  60 year old female with left-sided weakness. History of prior stroke. EXAM: CT HEAD WITHOUT CONTRAST TECHNIQUE: Contiguous axial images were obtained from the base of the skull through the vertex without intravenous contrast. COMPARISON:  Head CT dated 05/22/2014 FINDINGS: The ventricles and the sulci are appropriate in size for the patient's age. There is no intracranial hemorrhage. No midline shift or mass effect identified. The gray-white matter differentiation is preserved. There is diffuse mucoperiosteal thickening and partial opacification of the left maxillary sinus. There is old-appearing fracture of the left lamina Propecia. The remainder of the visualized paranasal sinuses and mastoid air cells are clear. The calvarium is intact. IMPRESSION: No acute intracranial pathology. If symptoms persist and there are no contraindications, MRI may provide better evaluation if clinically indicated These results were called by telephone at the time of interpretation on 09/26/2015 at 11:11 pm to Dr. Bethann Berkshire , who verbally acknowledged these results. Electronically Signed   By: Elgie Collard M.D.   On: 09/26/2015 23:13   Mr Maxine Glenn Head Wo Contrast  Result Date: 09/27/2015 CLINICAL DATA:  Initial evaluation for acute left-sided weakness. EXAM: MRI HEAD WITHOUT CONTRAST MRA HEAD WITHOUT CONTRAST TECHNIQUE: Multiplanar, multiecho pulse sequences of the brain and surrounding structures were obtained without intravenous contrast. Angiographic images of the head were obtained using MRA technique without contrast. COMPARISON:  Prior CT from 09/26/2015 as well as previous MRI from 10/09/2012. FINDINGS: MRI HEAD FINDINGS Cerebral volume within normal limits for patient age. Minimal patchy T2/FLAIR hyperintensity within the periventricular and deep white matter, most consistent with chronic small vessel ischemic disease, mild for age. Chronic small vessel ischemic type  changes present within the pons is well. Multiple remote lacunar infarcts involving the bilateral external capsule/lentiform nuclei as well as the basal ganglia and thalami. Remote lacunar infarct within the right paramedian pons. No areas of remote cortical infarction appreciated. There is a 16 mm focus of restricted diffusion within the posterior right corona radiata extending inferiorly towards the right external capsule, consistent with an acute ischemic infarct (series 4, image 31). No associated mass effect or hemorrhage. No other acute infarct. Gray-white matter differentiation otherwise maintained. Major intracranial vascular flow voids are preserved. No acute or chronic intracranial hemorrhage. No mass lesion, midline shift, or mass effect. No hydrocephalus. No extra-axial fluid collection. Major dural sinuses are grossly patent. Craniocervical junction within normal limits mild degenerative disc bulging noted at C2-3 without stenosis. Visualized upper cervical spine otherwise unremarkable. Pituitary gland normal. No acute abnormality about the globes and orbits. Mucosal thickening with fluid level within the left maxillary sinus. Mild scattered mucosal thickening within the ethmoidal air cells and right maxillary sinus. Trace opacity left mastoid air cells. Inner ear structures normal. Bone marrow signal intensity within normal limits. No scalp soft tissue abnormality. MRA HEAD FINDINGS ANTERIOR CIRCULATION: Visualized distal cervical segments of the  internal carotid arteries are patent with antegrade flow. Petrous segments widely patent. Mild scattered atheromatous irregularity within the cavernous/ supraclinoid ICAs without focal high-grade stenosis right A1 segment hypoplastic with multiple regularity. Dominant left A1 segment widely patent. The and inferior communicating artery within normal limits. Left anterior cerebral artery widely patent. Right anterior cerebral arteries somewhat diminutive and  attenuated but is also patent to its distal aspect. M1 segments patent without up mild atheromatous irregularity within the mid and distal M1 segments without focal high-grade stenosis. MCA bifurcations within normal limits. Multifocal atheromatous irregularity with stenoses involving the MCA branches bilaterally, slightly worse on the left. POSTERIOR CIRCULATION: Left vertebral artery dominant and patent to the vertebrobasilar junction. Diminutive right vertebral artery terminates in PICA. Multi focal atheromatous irregularity with mild to moderate multi focal narrowing present within the proximal-mid basilar artery. Basilar artery widely patent distally. Superior cerebral arteries patent with multifocal atheromatous irregularity. Both of the posterior cerebral arteries arise from the basilar artery and are patent to their distal aspects. Multifocal atheromatous irregularity with stenoses within the PCAs bilaterally. Small 3 mm focal outpouching arising from the right/posterior aspect of mid basilar artery, potentially at the takeoff of the anterior inferior cerebral artery (series 6, image 88). Finding may reflect a small aneurysm. IMPRESSION: MRI HEAD IMPRESSION: 1. 16 mm acute ischemic nonhemorrhagic infarct involving the posterior right corona radiata. No associated mass effect. 2. Multiple remote lacunar infarcts involving the bilateral external capsules/basal ganglia, bilateral thalami, and the right paramedian pons. 3. Mild chronic small vessel ischemic disease. 4. Acute left maxillary sinusitis. MRA HEAD IMPRESSION: 1. Negative for large vessel occlusion. 2. Atheromatous irregularity with superimposed mild to moderate multi focal stenoses. Overall, these changes have progressed from prior. 3. Extensive atheromatous irregularity with stenoses involving the PCAs bilaterally, also progressed from prior. 4. Moderate multifocal atheromatous irregularity with stenoses involving the MCA branches bilaterally, left  greater than right. 5. 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 Attention at follow-up recommended. Electronically Signed   By: Rise Mu M.D.   On: 09/27/2015 06:58   Mr Brain Wo Contrast  Result Date: 09/27/2015 CLINICAL DATA:  Initial evaluation for acute left-sided weakness. EXAM: MRI HEAD WITHOUT CONTRAST MRA HEAD WITHOUT CONTRAST TECHNIQUE: Multiplanar, multiecho pulse sequences of the brain and surrounding structures were obtained without intravenous contrast. Angiographic images of the head were obtained using MRA technique without contrast. COMPARISON:  Prior CT from 09/26/2015 as well as previous MRI from 10/09/2012. FINDINGS: MRI HEAD FINDINGS Cerebral volume within normal limits for patient age. Minimal patchy T2/FLAIR hyperintensity within the periventricular and deep white matter, most consistent with chronic small vessel ischemic disease, mild for age. Chronic small vessel ischemic type changes present within the pons is well. Multiple remote lacunar infarcts involving the bilateral external capsule/lentiform nuclei as well as the basal ganglia and thalami. Remote lacunar infarct within the right paramedian pons. No areas of remote cortical infarction appreciated. There is a 16 mm focus of restricted diffusion within the posterior right corona radiata extending inferiorly towards the right external capsule, consistent with an acute ischemic infarct (series 4, image 31). No associated mass effect or hemorrhage. No other acute infarct. Gray-white matter differentiation otherwise maintained. Major intracranial vascular flow voids are preserved. No acute or chronic intracranial hemorrhage. No mass lesion, midline shift, or mass effect. No hydrocephalus. No extra-axial fluid collection. Major dural sinuses  are grossly patent. Craniocervical junction within  normal limits mild degenerative disc bulging noted at C2-3 without stenosis. Visualized upper cervical spine otherwise unremarkable. Pituitary gland normal. No acute abnormality about the globes and orbits. Mucosal thickening with fluid level within the left maxillary sinus. Mild scattered mucosal thickening within the ethmoidal air cells and right maxillary sinus. Trace opacity left mastoid air cells. Inner ear structures normal. Bone marrow signal intensity within normal limits. No scalp soft tissue abnormality. MRA HEAD FINDINGS ANTERIOR CIRCULATION: Visualized distal cervical segments of the internal carotid arteries are patent with antegrade flow. Petrous segments widely patent. Mild scattered atheromatous irregularity within the cavernous/ supraclinoid ICAs without focal high-grade stenosis right A1 segment hypoplastic with multiple regularity. Dominant left A1 segment widely patent. The and inferior communicating artery within normal limits. Left anterior cerebral artery widely patent. Right anterior cerebral arteries somewhat diminutive and attenuated but is also patent to its distal aspect. M1 segments patent without up mild atheromatous irregularity within the mid and distal M1 segments without focal high-grade stenosis. MCA bifurcations within normal limits. Multifocal atheromatous irregularity with stenoses involving the MCA branches bilaterally, slightly worse on the left. POSTERIOR CIRCULATION: Left vertebral artery dominant and patent to the vertebrobasilar junction. Diminutive right vertebral artery terminates in PICA. Multi focal atheromatous irregularity with mild to moderate multi focal narrowing present within the proximal-mid basilar artery. Basilar artery widely patent distally. Superior cerebral arteries patent with multifocal atheromatous irregularity. Both of the posterior cerebral arteries arise from the basilar artery and are patent to their distal aspects. Multifocal atheromatous  irregularity with stenoses within the PCAs bilaterally. Small 3 mm focal outpouching arising from the right/posterior aspect of mid basilar artery, potentially at the takeoff of the anterior inferior cerebral artery (series 6, image 88). Finding may reflect a small aneurysm. IMPRESSION: MRI HEAD IMPRESSION: 1. 16 mm acute ischemic nonhemorrhagic infarct involving the posterior right corona radiata. No associated mass effect. 2. Multiple remote lacunar infarcts involving the bilateral external capsules/basal ganglia, bilateral thalami, and the right paramedian pons. 3. Mild chronic small vessel ischemic disease. 4. Acute left maxillary sinusitis. MRA HEAD IMPRESSION: 1. Negative for large vessel occlusion. 2. Atheromatous irregularity with superimposed mild to moderate multi focal stenoses. Overall, these changes have progressed from prior. 3. Extensive atheromatous irregularity with stenoses involving the PCAs bilaterally, also progressed from prior. 4. Moderate multifocal atheromatous irregularity with stenoses involving the MCA branches bilaterally, left greater than right. 5. 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 Attention at follow-up recommended. Electronically Signed   By: Rise Mu M.D.   On: 09/27/2015 06:58   Dg Chest Port 1 View  Result Date: 09/26/2015 CLINICAL DATA:  Initial evaluation for acute weakness. EXAM: PORTABLE CHEST 1 VIEW COMPARISON:  Prior radiograph from 05/22/2014. FINDINGS: Cardiac and mediastinal silhouettes are within normal limits. Atheromatous plaque within the aortic arch. Lungs are hypoinflated. Patchy and hazy bibasilar opacities, right greater than left, favored to reflect atelectasis. Superimposed infection, particularly at the right lung base could be considered in the correct clinical setting. No pulmonary edema or pleural  effusion. No pneumothorax. No acute osseous abnormality. IMPRESSION: 1. Shallow lung inflation with patchy and hazy bibasilar opacities, right greater than left. Atelectasis is favored. Superimposed infection, particularly at the right lung base, could be considered in the correct clinical setting. 2. Aortic atherosclerosis. Electronically Signed   By: Rise Mu  M.D.   On: 09/26/2015 22:07   Dg Hip Unilat With Pelvis 2-3 Views Left  Result Date: 10/02/2015 CLINICAL DATA:  Generalized left hip pain for 1 year. Recent stroke. EXAM: DG HIP (WITH OR WITHOUT PELVIS) 2-3V LEFT COMPARISON:  None. FINDINGS: Single view of the pelvis and two views of the left hip are provided. Osseous alignment is normal. Bone mineralization is normal. No fracture line or displaced fracture fragment identified. No degenerative change. Soft tissues about the pelvis and left hip are unremarkable. IMPRESSION: Negative. Electronically Signed   By: Bary Richard M.D.   On: 10/02/2015 16:47    Microbiology: No results found for this or any previous visit (from the past 240 hour(s)).   Labs: Basic Metabolic Panel:  Recent Labs Lab 10/13/15 0455 10/15/15 0527  NA 142  --   K 3.8  --   CL 109  --   CO2 26  --   GLUCOSE 87  --   BUN 12  --   CREATININE 0.77 0.82  CALCIUM 9.7  --    Liver Function Tests: No results for input(s): AST, ALT, ALKPHOS, BILITOT, PROT, ALBUMIN in the last 168 hours. No results for input(s): LIPASE, AMYLASE in the last 168 hours. No results for input(s): AMMONIA in the last 168 hours. CBC:  Recent Labs Lab 10/09/15 0929 10/13/15 0455  WBC 6.9 5.0  NEUTROABS 3.6 2.3  HGB 14.0 13.4  HCT 41.3 41.7  MCV 91.6 93.9  PLT 147* 147*   Cardiac Enzymes: No results for input(s): CKTOTAL, CKMB, CKMBINDEX, TROPONINI in the last 168 hours. BNP: BNP (last 3 results) No results for input(s): BNP in the last 8760 hours.  ProBNP (last 3 results) No results for input(s): PROBNP in the  last 8760 hours.  CBG: No results for input(s): GLUCAP in the last 168 hours.     Signed:  Haydee Salter MD  FACP  [facility] 10/15/2015, 4:20 PM

## 2015-10-15 NOTE — Plan of Care (Signed)
Problem: RH Dressing Goal: LTG Patient will perform lower body dressing w/assist (OT) LTG: Patient will perform lower body dressing with assist, with/without cues in positioning using equipment (OT)  Goals upgraded based on good progress and extended LOS.  Problem: RH Toileting Goal: LTG Patient will perform toileting w/assist, cues/equip (OT) LTG: Patient will perform toiletiing (clothes management/hygiene) with assist, with/without cues using equipment (OT)  Goals upgraded based on progress and extended LOS.

## 2015-10-15 NOTE — Progress Notes (Signed)
Occupational Therapy Weekly Progress Note  Patient Details  Name: [name] MRN: [id number] Date of Birth: 1956-02-03  Beginning of progress report period: October 09, 2015 End of progress report period: October 15, 2015  Today's Date: 10/15/2015 OT Individual Time: 0800-0929 OT Individual Time Calculation (min): 89 min     Patient has met 2 of 5 short term goals.  [name] continues to make steady progress with OT at this time.  She currently completes sit to stand for LB selfcare tasks with min assist but still demonstrates increased pushing to the left side in standing.  Max demonstrational cueing with mod physical assist is needed for her to maintain left knee extension and midline orientation with dynamic balance.  Transfers stand pivot are still at a mod assist level as well secondary to decreased midline orientation.  LUE functional use continues to improve to a diminished level for self care tasks.  She is able to use the LUE for washing 50% of her body as well as assisting with pulling up pants and tying shoes with increased time.  Recommend continues neuro re-education to continue increasing overall independence with basic selfcare tasks.  Will continue with current OT POC to further progress ADL independence.  Some LTGs have been updated based on extended LOS and her great progress.     Patient continues to demonstrate the following deficits: decreased balance, decreased spatial awareness, decreased LUE functional use and strength,  and therefore will continue to benefit from skilled OT intervention to enhance overall performance with BADL, iADL and Reduce care partner burden.  Patient progressing toward long term goals..  Plan of care revisions: goals upgraded.  OT Short Term Goals Week 3:  OT Short Term Goal 1 (Week 3): Pt will perform shower transfers with min assist to walk-in shower. OT Short Term Goal 2 (Week 3): Pt will complete stand pivot toilet transfers with min assist to  the 3:1.   OT Short Term Goal 3 (Week 3): Pt will complete LB dressing with supervision sit to stand. OT Short Term Goal 4 (Week 3): Pt will perform LB bathing with supervision sit to stand.   Skilled Therapeutic Interventions/Progress Updates:    Pt completed selfcare re-training this session.  Had her roll the wheelchair with supervision to gather clothing prior to starting.  She was able to then ambulate from the bedside to the shower bench with use of the RW and mod assist.  Decreased step length noted on the left side with decreased midline orientation and decreased left knee and hip stability.  She was able to perform bathing sit to stand with min assist.  Still with increased pushing to the left side in standing, with increased weightbearing noted over the left hip in sitting.  Completed 50% of bathing using the LUE as the dominant washing hand.  Completed grooming tasks sit to stand for increased balance with min assist.  Dressing completed sit to stand as well with min assist.  Finished session with wheelchair mobility in the hallway with min instructional cueing and supervision to increase LUE functional use.  Pt left in wheelchair at end of session with call button in reach.  Safety belt in place.  Therapy Documentation Precautions:  Precautions Precautions: Fall Precaution Comments: left hemiparesis, pusher to the left Restrictions Weight Bearing Restrictions: No  Pain: Pain Assessment Pain Assessment: No/denies pain Pain Score: 2  ADL: See Function Navigator for Current Functional Status.   Therapy/Group: Individual Therapy  Adda Stokes OTR/L 10/15/2015, 10:09 AM

## 2015-10-15 NOTE — Progress Notes (Signed)
Speech Language Pathology Weekly Progress and Session Notes  Patient Details  Name: [name] MRN: [id number] Date of Birth: 03-28-1955  Beginning of progress report period: October 08, 2015 End of progress report period: October 15, 2015  Today's Date: 10/15/2015 SLP Individual Time: 1300-1355 SLP Individual Time Calculation (min): 55 min   Short Term Goals: Week 2: SLP Short Term Goal 1 (Week 2): Pt will demonstrate functional problem solving for moderately complex tasks with mod A verbal cues.  SLP Short Term Goal 1 - Progress (Week 2): Met SLP Short Term Goal 2 (Week 2): Pt will demonstrate anticipatory awareness and identify 3 tasks she can participate in at home safely with Min A verbal cues.  SLP Short Term Goal 2 - Progress (Week 2): Met SLP Short Term Goal 3 (Week 2): Pt will utilize external memory aids to recall novel detailed information with Min A verbal and question cues.  SLP Short Term Goal 3 - Progress (Week 2): Met    New Short Term Goals: Week 3: SLP Short Term Goal 1 (Week 3): Pt will utilize external memory aids to recall novel detailed information with supervision verbal and question cues.  SLP Short Term Goal 2 (Week 3): Pt will demonstrate functional problem solving for moderately complex tasks with min A verbal cues.   Weekly Progress Updates: Patient has made excellent gains and has met 3 of 3 STG's this reporting period due to improved cognitive functioning. Currently, patient requires overall Min A to complete functional and familiar tasks safely in regards to problem solving and recall and is overall Mod I for anticipatory awareness. Due to patient's progress, recommend patient change to receiving SLP services to 3X/week. Patient and therapy team in agreement. Patient would benefit from continued skilled SLP intervention to maximize cognitive function and overall functional independence prior to discharge.    Intensity: Minumum of 1-2 x/day, 30 to 90  minutes Frequency: 1 to 3 out of 7 days Duration/Length of Stay: 8/14 Treatment/Interventions: Patient/family education;Cueing hierarchy;Environmental controls;Cognitive remediation/compensation;Functional tasks;Internal/external aids;Therapeutic Activities   Daily Session  Skilled Therapeutic Interventions: Skilled treatment session focused on cognitive goals. SLP facilitated session by providing supervision question cues for patient to utilize memory compensatory strategies to maximize recall during a novel memory task. Patient independently recalled 60% of items and utilzied external aids with Mod I to maximize recall to 100% after a 10 minute delay. Patient also recalled events from morning therapy sessions and goals of therapy with supervision question cues. Patient left upright in wheelchair with quick release belt in place and all needs within reach. Continue with current plan of care.       Function:   Cognition Comprehension Comprehension assist level: Follows basic conversation/direction with no assist  Expression   Expression assist level: Expresses basic needs/ideas: With no assist  Social Interaction Social Interaction assist level: Interacts appropriately 90% of the time - Needs monitoring or encouragement for participation or interaction.  Problem Solving Problem solving assist level: Solves basic 90% of the time/requires cueing < 10% of the time  Memory Memory assist level: Recognizes or recalls 90% of the time/requires cueing < 10% of the time   Pain Pain Assessment Pain Assessment: 0-10 Pain Score: 3  Pain Type: Chronic pain Pain Location: Back Pain Descriptors / Indicators: Aching Pain Frequency: Intermittent Pain Onset: On-going Patients Stated Pain Goal: 2 Pain Intervention(s): Medication (See eMAR);Repositioned Multiple Pain Sites: No  Therapy/Group: Individual Therapy  Lilli Dewald 10/15/2015, 3:21 PM

## 2015-10-15 NOTE — Progress Notes (Signed)
Subjective/Complaints: Pt sitting in her wheelchair working with OT this AM.  She slept on/off overnight and had some discomfort because of the way she was positioned.     ROS: Denies N/V/D, CP, SOB.  Objective: Vital Signs: Blood pressure 116/63, pulse 68, temperature 97.9 F (36.6 C), temperature source Oral, resp. rate 20, weight 63 kg (138 lb 14.4 oz), SpO2 98 %. No results found. Results for orders placed or performed during the hospital encounter of 10/01/15 (from the past 72 hour(s))  Basic metabolic panel     Status: None   Collection Time: 10/13/15  4:55 AM  Result Value Ref Range   Sodium 142 135 - 145 mmol/L   Potassium 3.8 3.5 - 5.1 mmol/L   Chloride 109 101 - 111 mmol/L   CO2 26 22 - 32 mmol/L   Glucose, Bld 87 65 - 99 mg/dL   BUN 12 6 - 20 mg/dL   Creatinine, Ser 0.77 0.44 - 1.00 mg/dL   Calcium 9.7 8.9 - 10.3 mg/dL   GFR calc non Af Amer >60 >60 mL/min   GFR calc Af Amer >60 >60 mL/min    Comment: (NOTE) The eGFR has been calculated using the CKD EPI equation. This calculation has not been validated in all clinical situations. eGFR's persistently <60 mL/min signify possible Chronic Kidney Disease.    Anion gap 7 5 - 15  CBC with Differential/Platelet     Status: Abnormal   Collection Time: 10/13/15  4:55 AM  Result Value Ref Range   WBC 5.0 4.0 - 10.5 K/uL   RBC 4.44 3.87 - 5.11 MIL/uL   Hemoglobin 13.4 12.0 - 15.0 g/dL   HCT 41.7 36.0 - 46.0 %   MCV 93.9 78.0 - 100.0 fL   MCH 30.2 26.0 - 34.0 pg   MCHC 32.1 30.0 - 36.0 g/dL   RDW 13.0 11.5 - 15.5 %   Platelets 147 (L) 150 - 400 K/uL   Neutrophils Relative % 46 %   Neutro Abs 2.3 1.7 - 7.7 K/uL   Lymphocytes Relative 41 %   Lymphs Abs 2.1 0.7 - 4.0 K/uL   Monocytes Relative 12 %   Monocytes Absolute 0.6 0.1 - 1.0 K/uL   Eosinophils Relative 1 %   Eosinophils Absolute 0.1 0.0 - 0.7 K/uL   Basophils Relative 0 %   Basophils Absolute 0.0 0.0 - 0.1 K/uL  Creatinine, serum     Status: None   Collection  Time: 10/15/15  5:27 AM  Result Value Ref Range   Creatinine, Ser 0.82 0.44 - 1.00 mg/dL   GFR calc non Af Amer >60 >60 mL/min   GFR calc Af Amer >60 >60 mL/min    Comment: (NOTE) The eGFR has been calculated using the CKD EPI equation. This calculation has not been validated in all clinical situations. eGFR's persistently <60 mL/min signify possible Chronic Kidney Disease.     Gen: NAD. Vital signs reviewed.  HEENT: Normocephalic, atraumatic Cardio: RRR and no murmur Resp: CTA B/L and unlabored GI: BS positive and NT,ND Musc/Skel:  No edema.  No tenderness. Neuro: Alert. Motor 5/5 in RUE and RLE LUE: 4-/5 L delt, bi, tri, grip  LLE: 4-/5 HF KE, 3- ADF/PF  Skin:  Warm and dry. Psych: Flat affect  Assessment/Plan: 1. Functional deficits secondary to Left hemiparesis which require 3+ hours per day of interdisciplinary therapy in a comprehensive inpatient rehab setting. Physiatrist is providing close team supervision and 24 hour management of active medical problems listed below.  Physiatrist and rehab team continue to assess barriers to discharge/monitor patient progress toward functional and medical goals. FIM: Function - Bathing Position: Shower Body parts bathed by patient: Right arm, Left arm, Chest, Abdomen, Right upper leg, Left upper leg, Right lower leg, Left lower leg, Back Body parts bathed by helper: Front perineal area, Buttocks Bathing not applicable: Back Assist Level: Touching or steadying assistance(Pt > 75%)  Function- Upper Body Dressing/Undressing What is the patient wearing?: Bra, Pull over shirt/dress Bra - Perfomed by patient: Thread/unthread right bra strap, Thread/unthread left bra strap Bra - Perfomed by helper: Hook/unhook bra (pull down sports bra) Pull over shirt/dress - Perfomed by patient: Thread/unthread right sleeve, Thread/unthread left sleeve, Put head through opening, Pull shirt over trunk Pull over shirt/dress - Perfomed by helper:  Thread/unthread left sleeve Assist Level: Supervision or verbal cues Set up : To obtain clothing/put away Function - Lower Body Dressing/Undressing What is the patient wearing?: Underwear, Pants, Shoes, Ted Hose Position: Wheelchair/chair at [employer] - Performed by patient: Thread/unthread right underwear leg, Thread/unthread left underwear leg Underwear - Performed by helper: Pull underwear up/down Pants- Performed by patient: Thread/unthread right pants leg, Thread/unthread left pants leg, Fasten/unfasten pants Pants- Performed by helper: Pull pants up/down Non-skid slipper socks- Performed by patient: Don/doff left sock, Don/doff right sock Non-skid slipper socks- Performed by helper: Don/doff right sock, Don/doff left sock Shoes - Performed by patient: Don/doff right shoe, Don/doff left shoe, Fasten right, Fasten left Shoes - Performed by helper: Fasten right TED Hose - Performed by helper: Don/doff right TED hose, Don/doff left TED hose Assist for footwear: Maximal assist Assist for lower body dressing: Touching or steadying assistance (Pt > 75%)  Function - Toileting Toileting steps completed by patient: Adjust clothing prior to toileting, Performs perineal hygiene Toileting steps completed by helper: Adjust clothing after toileting Toileting Assistive Devices: Grab bar or rail Assist level: Touching or steadying assistance (Pt.75%)  Function - Air cabin crew transfer assistive device: Grab bar Assist level to toilet: Touching or steadying assistance (Pt > 75%) Assist level from toilet: Touching or steadying assistance (Pt > 75%) Assist level to bedside commode (at bedside): Touching or steadying assistance (Pt > 75%) Assist level from bedside commode (at bedside): Touching or steadying assistance (Pt > 75%)  Function - Chair/bed transfer Chair/bed transfer method: Stand pivot Chair/bed transfer assist level: Maximal assist (Pt 25 - 49%/lift and lower) Chair/bed  transfer assistive device: Armrests Chair/bed transfer details: Manual facilitation for weight bearing, Manual facilitation for placement, Manual facilitation for weight shifting, Visual cues/gestures for sequencing, Verbal cues for sequencing, Verbal cues for technique, Verbal cues for precautions/safety, Visual cues/gestures for precautions/safety, Tactile cues for weight bearing  Function - Locomotion: Wheelchair Will patient use wheelchair at discharge?: Yes Type: Manual Max wheelchair distance: 181f Assist Level: Supervision or verbal cues Wheel 50 feet with 2 turns activity did not occur: Safety/medical concerns Assist Level: Supervision or verbal cues Wheel 150 feet activity did not occur: Safety/medical concerns Assist Level: Supervision or verbal cues Turns around,maneuvers to table,bed, and toilet,negotiates 3% grade,maneuvers on rugs and over doorsills: No Function - Locomotion: Ambulation Assistive device: Rail in hallway Max distance: 30 (side stepping L) Assist level: Moderate assist (Pt 50 - 74%) Assist level: Moderate assist (Pt 50 - 74%) Assist level: Moderate assist (Pt 50 - 74%) Walk 10 feet on uneven surfaces activity did not occur: Safety/medical concerns  Function - Comprehension Comprehension: Auditory Comprehension assist level: Follows basic conversation/direction with no assist  Function -  Expression Expression: Verbal Expression assist level: Expresses basic needs/ideas: With no assist  Function - Social Interaction Social Interaction assist level: Interacts appropriately 90% of the time - Needs monitoring or encouragement for participation or interaction.  Function - Problem Solving Problem solving assist level: Solves basic 90% of the time/requires cueing < 10% of the time  Function - Memory Memory assist level: Recognizes or recalls 90% of the time/requires cueing < 10% of the time Patient normally able to recall (first 3 days only): Current season,  Location of own room, Staff names and faces, That he or she is in a hospital  Medical Problem List and Plan: 1.  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  DVT Prophylaxis/Anticoagulation: Subcutaneous Lovenox. Monitor platelet counts in signs of bleeding 3. Pain Management/chronic back pain. 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 Mood/PTSD. Zoloft 50 mg daily, Requip 0.25 mg 3 times a day 5. Neuropsych: This patient is capable of making decisions on her own behalf. 6. Skin/Wound Care: Routine skin checks, prob tinea pedis R foot started clotrimazole BID 7. Fluids/Electrolytes/Nutrition: Routine I&O's 8. Tobacco alcohol abuse. Counseling. No nicotine craving reported 9. History of GI bleed. Continue Pepcid twice a day.   -Hb 13.4 on 8/1 10.  Hx Alcohol abuse denies ETOH for ~49mo11. 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  Constipation- improved  senna S 13. Sleep disturbance: prn trazodone  LOS (Days) 14 A FACE TO FACE EVALUATION WAS PERFORMED  Darcie Mellone ALorie Phenix8/05/2015, 8:34 AM

## 2015-10-15 NOTE — Progress Notes (Signed)
Physical Therapy Note  Patient Details  Name: [name] MRN: [id number] Date of Birth: 17-Nov-1955 Today's Date: 10/15/2015  1030-1135, 65 min individual tx Pain: none reported  neuromuscular re-education via forced use, multimodal cues, mirror feedback  for Kinetron in sitting and standing, focusing on midline orientation and LLE extension rather than pushing tendencies with RLE to L. Pt tolerated R biased standing without use of UEs during dual task of counting backwards 30>0 and 50> 0. Some errors noted when counting backwards from 50.  Standing reaching to R activity with L foot on 4" high stool to encourage R trunk elongation.  Gait using hallway railing on R, shoe build up and ACE LLE to encourage wt shift to R and control foot drop; x 20' forward and 10' backwards. W/c propulsion using bil UEs with Theraband wrapped around L wheel rim for increased grip, with significant extra time, but able to self correct steering. Pt left resting in w/c with quick release belt applied and all needs within reach.   Rella Egelston 10/15/2015, 7:57 AM

## 2015-10-16 ENCOUNTER — Inpatient Hospital Stay (HOSPITAL_COMMUNITY): Payer: Medicaid Other | Admitting: Physical Therapy

## 2015-10-16 ENCOUNTER — Inpatient Hospital Stay (HOSPITAL_COMMUNITY): Payer: Medicaid Other | Admitting: Occupational Therapy

## 2015-10-16 NOTE — Progress Notes (Addendum)
Physical Therapy Session Note  Patient Details  Name: [name] MRN: [id number] Date of Birth: 05/02/1955  Today's Date: 10/16/2015 PT Individual Time:  -  1135-1203 (make up time)  Calculated PT individual treatment time: 28 min        Short Term Goals: Week 1:  PT Short Term Goal 1 (Week 1): Pt will consistently perform functional transfers with mod A PT Short Term Goal 1 - Progress (Week 1): Met PT Short Term Goal 2 (Week 1): Pt will gait in controlled environment with max A 25' PT Short Term Goal 2 - Progress (Week 1): Met PT Short Term Goal 3 (Week 1): Pt will negotiate 4 stairs with max A PT Short Term Goal 3 - Progress (Week 1): Progressing toward goal Week 2:  PT Short Term Goal 1 (Week 2): pt will perform w/c mobility 50' in controlled environment with min A PT Short Term Goal 2 (Week 2): Pt will gait 50' with max A in controlled environment PT Short Term Goal 3 (Week 2): Pt will consistently perform transfers with min A  Skilled Therapeutic Interventions/Progress Updates:    Patient received sitting in WC and agreeable to PT.  Patient performed WC mobility with BUE propulsion for 160ft, 120ft and 75 ft with supervision A and mod cues for improved use of LUE Following first bout of WC mobility training, patient was noted to be sitting in R lateral lean with extended L thoracic region and rotated pelvis. Patient performed sit<>stand with mod A form PT with standing balance for 45seconds and mod cues for equalized weight distribution. Patient able to maintain standing balance with min A from in neutral alignment.  PT placed wedge under L hip for forced L sided thoracic closure and trunkal activation. Patient noted to have improved spinal alignment and improved use of trunk stabilizing musculature including improved success of LUE use with WC mobility.   Patient performed car transfer with mod-max A and no AD as well as cues for improved trunk and pelvic stability with LLE stance.    Patient returned to room and left sitting in WC with call bell in reach.    Therapy Documentation Precautions:  Precautions Precautions: Fall Precaution Comments: left hemiparesis, pusher to the left Restrictions Weight Bearing Restrictions: No General:   Vital Signs: Therapy Vitals Temp: 98.1 F (36.7 C) Temp Source: Oral Pulse Rate: 68 Resp: 16 BP: 115/69 Patient Position (if appropriate): Lying Oxygen Therapy SpO2: 100 % O2 Device: Not Delivered Pain: Pain Assessment Pain Assessment: 0-10 Pain Score: 8  Pain Type: Acute pain Pain Location: Arm Pain Orientation: Left Pain Descriptors / Indicators: Aching Pain Frequency: Intermittent Pain Onset: On-going Pain Intervention(s): Medication (See eMAR) Multiple Pain Sites: No   See Function Navigator for Current Functional Status.   Therapy/Group: Individual Therapy  Austin E Tucker 10/16/2015, 7:58 AM  

## 2015-10-16 NOTE — Progress Notes (Signed)
Orthopedic Tech Progress Note Patient Details:  [name] 07-18-1955 [id number]  Patient ID: [name], female   DOB: Nov 18, 1955, 60 y.o.   MRN: [id number]   [name] 10/16/2015, 12:35 PM Called [facility] for a left AFO.

## 2015-10-16 NOTE — Progress Notes (Addendum)
Occupational Therapy Session Note  Patient Details  Name: [name] MRN: [id number] Date of Birth: [birth year]  Today's Date: 10/16/2015 OT Individual Time: 1002-1102 OT Individual Time Calculation (min): 60 min     Short Term Goals: Week 2:  OT Short Term Goal 1 (Week 2): Pt will complete toilet transfers with min assist using LRAD. OT Short Term Goal 1 - Progress (Week 2): Not met OT Short Term Goal 2 (Week 2): Pt will perform shower transfers with min assist to walk-in shower. OT Short Term Goal 2 - Progress (Week 2): Not met OT Short Term Goal 3 (Week 2): Pt will complete stand pivot toilet transfers with min assist to the 3:1.   OT Short Term Goal 3 - Progress (Week 2): Not met OT Short Term Goal 4 (Week 2): Pt will perform LB dressing with min assist sit to stand.  OT Short Term Goal 4 - Progress (Week 2): Met OT Short Term Goal 5 (Week 2): Pt will use the LUE as an active assist for pulling up pants and tying shoes with supervision.  OT Short Term Goal 5 - Progress (Week 2): Met  Skilled Therapeutic Interventions/Progress Updates:    Pt received in w/c collecting clothes to be worn after shower. Pt propelled w/c into bathroom, then completed stand-pivot > shower chair with Mod A for weight shifting and sequence. Upper body and lower body bathing completed with Min A and supervision for safety.  UB dressing completed seated EOB with supervision. Pt required min A to stand with Rw, Min A for standing balance, and Min A to pull up pants . Pt continues to demonstrate lateral pushing to L during self-care tasks, but with minimal tactile cues, she could correct posture. Stand-pivot performed back to w/c w/ mod A.  Pt left seated in w/c at the sink to brush her teeth. NA notified to check on pt, door left open.   Therapy Documentation Precautions:  Precautions Precautions: Fall Precaution Comments: left hemiparesis, pusher to the left Restrictions Weight Bearing Restrictions: No      Pain: Pain Assessment Pain Assessment: No/denies pain     Other Treatments:    See Function Navigator for Current Functional Status.   Therapy/Group: Individual Therapy  Binnie Rail 10/16/2015, 3:25 PM

## 2015-10-16 NOTE — Progress Notes (Signed)
Physical Therapy Weekly Progress Note  Patient Details  Name: [name] MRN: [id number] Date of Birth: Jun 09, 1955  Beginning of progress report period: October 09, 2015 End of progress report period: October 16, 2015  Today's Date: 10/16/2015 PT Individual Time: 0921-1003 PT Individual Time Calculation (min): 42 min    Patient has made steady progress met 3 of 3 short term goals.  Pt is currently min-mod A overall for bed mobility, bed <> chair transfers, w/c mobility, gait and stair negotiation.  Pt LOS extended x 4 days for pt to continue to focus on gait and stair negotiation to improve safety and function prior to D/C home.  Patient continues to demonstrate the following deficits: L hemiparesis with impaired motor planning/sequencing and grading of movement, impaired postural control and balance, pusher's syndrome, impaired gait and therefore will continue to benefit from skilled PT intervention to enhance overall performance with balance, postural control, ability to compensate for deficits, functional use of  left upper extremity and left lower extremity, attention, awareness and coordination.  Patient progressing toward long term goals..  Continue plan of care.  PT Short Term Goals Week 2:  PT Short Term Goal 1 (Week 2): pt will perform w/c mobility 11' in controlled environment with min A PT Short Term Goal 2 (Week 2): Pt will gait 8' with mod A in controlled environment and LRAD PT Short Term Goal 3 (Week 2): Pt will consistently perform transfers with min A to L and R Week 3:  PT Short Term Goal 1 (Week 3): Pt will perform bed mobility and transfers with supervision PT Short Term Goal 2 (Week 3): Pt will perform w/c mobility x 150' with supervision in controlled and home environments PT Short Term Goal 3 (Week 3): Pt will perform gait x 25' with LRAD and min A in controlled environment PT Short Term Goal 4 (Week 3): Pt will negotiate 12 stairs with min A and R rail   Skilled  Therapeutic Interventions/Progress Updates:  Pt received in bed, no c/o pain.  Performed supine > sit and scooting to EOB with min A.  Assisted pt with donning pants and performed sit > stand to pull pants up with min A.  Performed stand pivot to w/c with UE support and mod A for weight shifting and pivoting sequence.  Pt initiated w/c mobility with bilat UE propulsion and mod A to maintain straight navigation but due to time constraints pt pushed to gym with total A.  Performed NMR to facilitate weight shifting, sustained activation, motor planning/sequence training during stair negotiation training up/down full flight of stairs with R rail to simulate home with pt ascending forwards and descending laterally with mod A to facilitate R lateral weight shifting, full LLE clearance and LLE stability when descending.  Continued NMR with gait training x 25' with R SBQC to simulate AD pt used at home with mod A and facilitation of therapist at trunk for R trunk elongation and weight shift, full LLE step length and stabilization during stance phase + verbal cues for sequencing.  At end of session pt returned to room to rest in w/c until OT session.  Therapy Documentation Precautions:  Precautions Precautions: Fall Precaution Comments: left hemiparesis, pusher to the left Restrictions Weight Bearing Restrictions: No Pain: No c/o pain       See Function Navigator for Current Functional Status.  Therapy/Group: Individual Therapy  Raylene Everts [facility] 10/16/2015, 12:28 PM

## 2015-10-16 NOTE — Progress Notes (Signed)
Subjective/Complaints: Pt sitting up in bed this AM.  She was cold overnight and her room is warm this AM.  ROS: Denies N/V/D, CP, SOB.  Objective: Vital Signs: Blood pressure 115/69, pulse 68, temperature 98.1 F (36.7 C), temperature source Oral, resp. rate 16, weight 63 kg (138 lb 14.4 oz), SpO2 100 %. No results found. Results for orders placed or performed during the hospital encounter of 10/01/15 (from the past 72 hour(s))  Creatinine, serum     Status: None   Collection Time: 10/15/15  5:27 AM  Result Value Ref Range   Creatinine, Ser 0.82 0.44 - 1.00 mg/dL   GFR calc non Af Amer >60 >60 mL/min   GFR calc Af Amer >60 >60 mL/min    Comment: (NOTE) The eGFR has been calculated using the CKD EPI equation. This calculation has not been validated in all clinical situations. eGFR's persistently <60 mL/min signify possible Chronic Kidney Disease.     Gen: NAD. Vital signs reviewed.  HEENT: Normocephalic, atraumatic Cardio: RRR and no murmur Resp: CTA B/L and unlabored GI: BS positive and NT,ND Musc/Skel:  No edema.  No tenderness. Neuro: Alert. Motor 5/5 in RUE and RLE LUE: 4-/5 L delt, bi, tri, grip  LLE: 4-/5 HF KE, 3- ADF/PF  Skin:  Warm and dry. Psych: Flat affect (slightly improved)  Assessment/Plan: 1. Functional deficits secondary to Left hemiparesis which require 3+ hours per day of interdisciplinary therapy in a comprehensive inpatient rehab setting. Physiatrist is providing close team supervision and 24 hour management of active medical problems listed below. Physiatrist and rehab team continue to assess barriers to discharge/monitor patient progress toward functional and medical goals. FIM: Function - Bathing Position: Shower Body parts bathed by patient: Right arm, Left arm, Chest, Abdomen, Right upper leg, Left upper leg, Right lower leg, Left lower leg, Back Body parts bathed by helper: Front perineal area, Buttocks Bathing not applicable: Back Assist  Level: Touching or steadying assistance(Pt > 75%)  Function- Upper Body Dressing/Undressing What is the patient wearing?: Bra, Pull over shirt/dress Bra - Perfomed by patient: Thread/unthread right bra strap, Thread/unthread left bra strap, Hook/unhook bra (pull down sports bra) Bra - Perfomed by helper: Hook/unhook bra (pull down sports bra) Pull over shirt/dress - Perfomed by patient: Thread/unthread right sleeve, Thread/unthread left sleeve, Put head through opening, Pull shirt over trunk Pull over shirt/dress - Perfomed by helper: Thread/unthread left sleeve Assist Level: Supervision or verbal cues Set up : To obtain clothing/put away Function - Lower Body Dressing/Undressing What is the patient wearing?: Underwear, Pants, Shoes, Ted Hose Position: Wheelchair/chair at [employer] - Performed by patient: Thread/unthread right underwear leg, Thread/unthread left underwear leg, Pull underwear up/down Underwear - Performed by helper: Pull underwear up/down Pants- Performed by patient: Thread/unthread right pants leg, Thread/unthread left pants leg, Fasten/unfasten pants, Pull pants up/down Pants- Performed by helper: Pull pants up/down Non-skid slipper socks- Performed by patient: Don/doff left sock, Don/doff right sock Non-skid slipper socks- Performed by helper: Don/doff right sock, Don/doff left sock Shoes - Performed by patient: Don/doff right shoe, Don/doff left shoe, Fasten right, Fasten left Shoes - Performed by helper: Fasten right TED Hose - Performed by patient: Don/doff right TED hose, Don/doff left TED hose TED Hose - Performed by helper: Don/doff right TED hose, Don/doff left TED hose Assist for footwear: Maximal assist Assist for lower body dressing: Touching or steadying assistance (Pt > 75%)  Function - Toileting Toileting steps completed by patient: Performs perineal hygiene Toileting steps completed by helper:  Adjust clothing after toileting, Adjust clothing prior to  toileting Toileting Assistive Devices: Grab bar or rail Assist level: Touching or steadying assistance (Pt.75%)  Function - Toilet Transfers Toilet transfer assistive device: Grab bar Assist level to toilet: Touching or steadying assistance (Pt > 75%) Assist level from toilet: Touching or steadying assistance (Pt > 75%) Assist level to bedside commode (at bedside): Touching or steadying assistance (Pt > 75%) Assist level from bedside commode (at bedside): Touching or steadying assistance (Pt > 75%)  Function - Chair/bed transfer Chair/bed transfer method: Stand pivot Chair/bed transfer assist level: Moderate assist (Pt 50 - 74%/lift or lower) Chair/bed transfer assistive device: Armrests Chair/bed transfer details: Manual facilitation for weight bearing, Manual facilitation for placement, Manual facilitation for weight shifting, Visual cues/gestures for sequencing, Verbal cues for sequencing, Verbal cues for technique, Verbal cues for precautions/safety, Visual cues/gestures for precautions/safety, Tactile cues for weight bearing  Function - Locomotion: Wheelchair Will patient use wheelchair at discharge?: Yes Type: Manual Max wheelchair distance: 150 Assist Level: Supervision or verbal cues Wheel 50 feet with 2 turns activity did not occur: Safety/medical concerns Assist Level: Supervision or verbal cues Wheel 150 feet activity did not occur: Safety/medical concerns Assist Level: Supervision or verbal cues Turns around,maneuvers to table,bed, and toilet,negotiates 3% grade,maneuvers on rugs and over doorsills: No Function - Locomotion: Ambulation Assistive device: Rail in hallway, Orthosis Max distance: 20 Assist level: Moderate assist (Pt 50 - 74%) Assist level: Moderate assist (Pt 50 - 74%) Assist level: Moderate assist (Pt 50 - 74%) Walk 10 feet on uneven surfaces activity did not occur: Safety/medical concerns  Function - Comprehension Comprehension: Auditory Comprehension  assist level: Follows basic conversation/direction with no assist  Function - Expression Expression: Verbal Expression assist level: Expresses basic needs/ideas: With no assist  Function - Social Interaction Social Interaction assist level: Interacts appropriately 90% of the time - Needs monitoring or encouragement for participation or interaction.  Function - Problem Solving Problem solving assist level: Solves basic 90% of the time/requires cueing < 10% of the time  Function - Memory Memory assist level: Recognizes or recalls 90% of the time/requires cueing < 10% of the time Patient normally able to recall (first 3 days only): Current season, Location of own room, Staff names and faces, That he or she is in a hospital  Medical Problem List and Plan: 1.  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  DVT Prophylaxis/Anticoagulation: Subcutaneous Lovenox. Monitor platelet counts in signs of bleeding 3. Pain Management/chronic back pain. 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 Mood/PTSD. Zoloft 50 mg daily, Requip 0.25 mg 3 times a day 5. Neuropsych: This patient is capable of making decisions on her own behalf. 6. Skin/Wound Care: Routine skin checks, prob tinea pedis R foot started clotrimazole BID 7. Fluids/Electrolytes/Nutrition: Routine I&O's 8. Tobacco alcohol abuse. Counseling. No nicotine craving reported 9. History of GI bleed. Continue Pepcid twice a day.   -Hb 13.4 on 8/1 10.  Hx Alcohol abuse denies ETOH for ~79mo11. 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  Constipation- improved  senna S 13. Sleep disturbance: prn trazodone  LOS (Days) 15 A FACE TO FACE EVALUATION WAS PERFORMED  [name] Lorie Phenix 10/16/2015, 8:39 AM

## 2015-10-16 NOTE — Progress Notes (Signed)
Occupational Therapy Session Note  Patient Details  Name: [name] MRN: [id number] Date of Birth: 10-Nov-1955  Today's Date: 10/16/2015 OT Individual Time: 1410-1540 OT Individual Time Calculation (min): 90 min     Short Term Goals: Week 1:  OT Short Term Goal 1 (Week 1): Pt will complete LB bathing sit to stand with mod assist. OT Short Term Goal 1 - Progress (Week 1): Met OT Short Term Goal 2 (Week 1): Pt will complete LB dressing sit to stand with mod assist.  OT Short Term Goal 2 - Progress (Week 1): Met OT Short Term Goal 3 (Week 1): Pt will complete toilet transfers with min assist using LRAD. OT Short Term Goal 3 - Progress (Week 1): Not met OT Short Term Goal 4 (Week 1): Pt will perform shower transfers with min assist to walk-in shower. OT Short Term Goal 4 - Progress (Week 1): Not met OT Short Term Goal 5 (Week 1): Pt will use the LUE as a gross assist for bathing and dressing tasks with min assist.  OT Short Term Goal 5 - Progress (Week 1): Met Week 2:  OT Short Term Goal 1 (Week 2): Pt will complete toilet transfers with min assist using LRAD. OT Short Term Goal 1 - Progress (Week 2): Not met OT Short Term Goal 2 (Week 2): Pt will perform shower transfers with min assist to walk-in shower. OT Short Term Goal 2 - Progress (Week 2): Not met OT Short Term Goal 3 (Week 2): Pt will complete stand pivot toilet transfers with min assist to the 3:1.   OT Short Term Goal 3 - Progress (Week 2): Not met OT Short Term Goal 4 (Week 2): Pt will perform LB dressing with min assist sit to stand.  OT Short Term Goal 4 - Progress (Week 2): Met OT Short Term Goal 5 (Week 2): Pt will use the LUE as an active assist for pulling up pants and tying shoes with supervision.  OT Short Term Goal 5 - Progress (Week 2): Met Week 3:  OT Short Term Goal 1 (Week 3): Pt will perform shower transfers with min assist to walk-in shower. OT Short Term Goal 2 (Week 3): Pt will complete stand pivot toilet  transfers with min assist to the 3:1.   OT Short Term Goal 3 (Week 3): Pt will complete LB dressing with supervision sit to stand. OT Short Term Goal 4 (Week 3): Pt will perform LB bathing with supervision sit to stand.  Skilled Therapeutic Interventions/Progress Updates:   Pt participated in skilled OT tx session focusing on improving L sided pushing and raising postural awareness during functional tasks. Pt participated in dynamic sitting task at [facility] involving shortening of L sided musculature while lengthening R trunk to improve seated posture during self care tasks. Pt required min cuing during task and was able to actively dissociate pelvis from trunk during functional movements while hanging up clothespins on basketball net. Afterwards, pt transitioned to a dynamic standing task with RW and visual biofeedback during horse shoe toss. Pt required mod vcs to attend to left sided pushing and to pause during activity to realign posture. Pt completed x4 trials with longest standing time without rest approximately 2 minutes. During final trial, pt was able to complete task with RW and close supervision with instruction to independently correct posture when she felt misalignment. For remainder of session pt participated in L UE strengthening exercises with use of 1.5# dumbbell and AAROM for facilitation of proper technique.  Without physical assist, pt demonstrated compensatory movement patterns during exercises. At end of tx, pt self propelled partway back to room. Pt exhibited difficultly with navigating around tight spaces/bumping into environmental barriers and would continue to benefit from w/c training to improve this deficit. Mod A for stand step transfers today without AD. Pt left in room up in w/c with all needs within reach. Pt was provided education on improvements today with verbalized understanding.     Therapy Documentation Precautions:  Precautions Precautions: Fall Precaution Comments: left  hemiparesis, pusher to the left Restrictions Weight Bearing Restrictions: No General:   Vital Signs: Therapy Vitals Temp: 98.3 F (36.8 C) Temp Source: Oral Pulse Rate: 83 Resp: 18 BP: 107/67 Patient Position (if appropriate): Sitting Oxygen Therapy SpO2: 98 % O2 Device: Not Delivered Pain: No c/o pain  Pain Assessment Pain Assessment: No/denies pain ADL:   Exercises: for L UE: 1.5# dumbbell x10 reps 2 sets scaption, shoulder abduction, supination/pronation, tricep punches, and overhead punches  :    See Function Navigator for Current Functional Status.   Therapy/Group: Individual Therapy  Destanie Tibbetts A Wilburn Keir 10/16/2015, 4:49 PM

## 2015-10-17 ENCOUNTER — Inpatient Hospital Stay (HOSPITAL_COMMUNITY): Payer: Medicaid Other | Admitting: Occupational Therapy

## 2015-10-17 ENCOUNTER — Inpatient Hospital Stay (HOSPITAL_COMMUNITY): Payer: Medicaid Other | Admitting: Physical Therapy

## 2015-10-17 NOTE — Progress Notes (Signed)
Physical Therapy Session Note  Patient Details  Name: [name] MRN: [id number] Date of Birth: April 03, 1955  Today's Date: 10/17/2015 PT Individual Time: 3833-3832 PT Individual Time Calculation (min): 57 min    Short Term Goals: Week 1:  PT Short Term Goal 1 (Week 1): Pt will consistently perform functional transfers with mod A PT Short Term Goal 1 - Progress (Week 1): Met PT Short Term Goal 2 (Week 1): Pt will gait in controlled environment with max A 25' PT Short Term Goal 2 - Progress (Week 1): Met PT Short Term Goal 3 (Week 1): Pt will negotiate 4 stairs with max A PT Short Term Goal 3 - Progress (Week 1): Met Week 2:  PT Short Term Goal 1 (Week 2): pt will perform w/c mobility 50' in controlled environment with min A PT Short Term Goal 2 (Week 2): Pt will gait 14' with mod A in controlled environment and LRAD PT Short Term Goal 3 (Week 2): Pt will consistently perform transfers with min A to L and R Week 3:  PT Short Term Goal 1 (Week 3): Pt will perform bed mobility and transfers with supervision PT Short Term Goal 2 (Week 3): Pt will perform w/c mobility x 150' with supervision in controlled and home environments PT Short Term Goal 3 (Week 3): Pt will perform gait x 25' with LRAD and min A in controlled environment PT Short Term Goal 4 (Week 3): Pt will negotiate 12 stairs with min A and R rail  Skilled Therapeutic Interventions/Progress Updates:    Patient received sitting in WC and agreeable to PT. PT instructed patient in lateral scoot in chair for improved pelvic positioning. Patient unable to correct postural abnormality with lateral scoot. PT placed towel wedge under L hip which improved pelvic positioning and improved spinal/trunk alignment.  WC mobility for 127f with supervision A and od-max cues for improved use of LUE to prevent veer to L.   Standing tolerance for up to 2 minutes x 5 with mod- max A from PT and max multimodal cues from PT for improved postural alignment  and increased WB through RLE. Patient also instructed in reahc task to R for improved L sided trunk activation and improved R trunk elongation. Seated trunk activation x 10 minutes with reaches R and then L and required return to neutral alignment. Min A form PT with tactile cues for improved activation and positioning.   Gait with SBQC for 15 ft. Max A from PT with max multimodal cues for imrpoved WB through R LE, improved step length Bilaterally, increased activation of L sided trunk to facilitate WB over RLE. Patient able to carry over instruction from PT 25% of the time.   Squat pivot transfer training to R and L x 2 each with min-mod A and multimodal cues for improved positoining, increased trunk activation and proper pelvic positioning following each transfer.   [facility] and call bell in reach.     Therapy Documentation Precautions:  Precautions Precautions: Fall Precaution Comments: left hemiparesis, pusher to the left Restrictions Weight Bearing Restrictions: No    Pain: Pain Assessment Pain Assessment: 0-10 Pain Score: 6 Pain Type: Acute pain Pain Location: shoulder Pain Descriptors / Indicators: Aching Pain Onset: Gradual Pain Intervention(s): Medication (See eMAR)  See Function Navigator for Current Functional Status.   Therapy/Group: Individual Therapy  ALorie Phenix8/07/2015, 6:24 PM

## 2015-10-17 NOTE — Progress Notes (Signed)
Subjective/Complaints: Pt laying in bed.  She is sore from therapies yesterday.   ROS: Denies N/V/D, CP, SOB.  Objective: Vital Signs: Blood pressure 111/61, pulse 70, temperature 98.6 F (37 C), temperature source Oral, resp. rate 18, weight 63 kg (138 lb 14.4 oz), SpO2 97 %. No results found. Results for orders placed or performed during the hospital encounter of 10/01/15 (from the past 72 hour(s))  Creatinine, serum     Status: None   Collection Time: 10/15/15  5:27 AM  Result Value Ref Range   Creatinine, Ser 0.82 0.44 - 1.00 mg/dL   GFR calc non Af Amer >60 >60 mL/min   GFR calc Af Amer >60 >60 mL/min    Comment: (NOTE) The eGFR has been calculated using the CKD EPI equation. This calculation has not been validated in all clinical situations. eGFR's persistently <60 mL/min signify possible Chronic Kidney Disease.     Gen: NAD. Vital signs reviewed.  HEENT: Normocephalic, atraumatic Cardio: RRR and no murmur Resp: CTA B/L and unlabored GI: BS positive and NT,ND Musc/Skel:  No edema.  No tenderness. Neuro: Alert. Motor 5/5 in RUE and RLE LUE: 4-/5 L delt, bi, tri, grip  LLE: 4-/5 HF KE, 3- ADF/PF  Skin:  Warm and dry. Psych: Flat affect (slightly improved)  Assessment/Plan: 1. Functional deficits secondary to Left hemiparesis which require 3+ hours per day of interdisciplinary therapy in a comprehensive inpatient rehab setting. Physiatrist is providing close team supervision and 24 hour management of active medical problems listed below. Physiatrist and rehab team continue to assess barriers to discharge/monitor patient progress toward functional and medical goals. FIM: Function - Bathing Position: Shower Body parts bathed by patient: Right arm, Left arm, Chest, Abdomen, Front perineal area, Right upper leg, Left upper leg, Right lower leg, Left lower leg Body parts bathed by helper: Buttocks Bathing not applicable: Back Assist Level: Touching or steadying  assistance(Pt > 75%)  Function- Upper Body Dressing/Undressing What is the patient wearing?: Bra, Pull over shirt/dress Bra - Perfomed by patient: Thread/unthread right bra strap, Thread/unthread left bra strap, Hook/unhook bra (pull down sports bra) Bra - Perfomed by helper: Hook/unhook bra (pull down sports bra) Pull over shirt/dress - Perfomed by patient: Thread/unthread right sleeve, Thread/unthread left sleeve, Put head through opening, Pull shirt over trunk Pull over shirt/dress - Perfomed by helper: Thread/unthread left sleeve Assist Level: Supervision or verbal cues Set up : To obtain clothing/put away Function - Lower Body Dressing/Undressing What is the patient wearing?: Shoes, [employer], Underwear, Pants, Socks Position: Sitting EOB Underwear - Performed by patient: Thread/unthread right underwear leg, Thread/unthread left underwear leg Underwear - Performed by helper: Pull underwear up/down Pants- Performed by patient: Thread/unthread right pants leg, Thread/unthread left pants leg Pants- Performed by helper: Pull pants up/down Non-skid slipper socks- Performed by patient: Don/doff left sock, Don/doff right sock Non-skid slipper socks- Performed by helper: Don/doff right sock, Don/doff left sock Socks - Performed by patient: Don/doff right sock, Don/doff left sock Shoes - Performed by patient: Don/doff left shoe, Don/doff right shoe, Fasten left, Fasten right Shoes - Performed by helper: Fasten right TED Hose - Performed by patient: Don/doff right TED hose, Don/doff left TED hose TED Hose - Performed by helper: Don/doff right TED hose, Don/doff left TED hose Assist for footwear: Maximal assist Assist for lower body dressing: Touching or steadying assistance (Pt > 75%)  Function - Toileting Toileting steps completed by patient: Performs perineal hygiene Toileting steps completed by helper: Adjust clothing after toileting, Adjust  clothing prior to toileting Toileting Assistive  Devices: Grab bar or rail Assist level: Touching or steadying assistance (Pt.75%)  Function - Toilet Transfers Toilet transfer assistive device: Grab bar Assist level to toilet: Touching or steadying assistance (Pt > 75%) Assist level from toilet: Touching or steadying assistance (Pt > 75%) Assist level to bedside commode (at bedside): Touching or steadying assistance (Pt > 75%) Assist level from bedside commode (at bedside): Touching or steadying assistance (Pt > 75%)  Function - Chair/bed transfer Chair/bed transfer method: Stand pivot Chair/bed transfer assist level: Moderate assist (Pt 50 - 74%/lift or lower) Chair/bed transfer assistive device: Armrests, Cane Chair/bed transfer details: Manual facilitation for weight bearing, Manual facilitation for placement, Manual facilitation for weight shifting, Visual cues/gestures for sequencing, Verbal cues for sequencing, Verbal cues for technique, Verbal cues for precautions/safety, Visual cues/gestures for precautions/safety, Tactile cues for weight bearing  Function - Locomotion: Wheelchair Will patient use wheelchair at discharge?: Yes Type: Manual Max wheelchair distance: 165 Assist Level: Supervision or verbal cues Wheel 50 feet with 2 turns activity did not occur: Safety/medical concerns Assist Level: Supervision or verbal cues Wheel 150 feet activity did not occur: Safety/medical concerns Assist Level: Supervision or verbal cues Turns around,maneuvers to table,bed, and toilet,negotiates 3% grade,maneuvers on rugs and over doorsills: No Function - Locomotion: Ambulation Assistive device: Cane-quad Max distance: 25 Assist level: Moderate assist (Pt 50 - 74%) Assist level: Moderate assist (Pt 50 - 74%) Assist level: Moderate assist (Pt 50 - 74%) Walk 10 feet on uneven surfaces activity did not occur: Safety/medical concerns  Function - Comprehension Comprehension: Auditory Comprehension assist level: Follows basic  conversation/direction with no assist  Function - Expression Expression: Verbal Expression assist level: Expresses basic needs/ideas: With no assist  Function - Social Interaction Social Interaction assist level: Interacts appropriately 90% of the time - Needs monitoring or encouragement for participation or interaction.  Function - Problem Solving Problem solving assist level: Solves basic 90% of the time/requires cueing < 10% of the time  Function - Memory Memory assist level: Recognizes or recalls 90% of the time/requires cueing < 10% of the time Patient normally able to recall (first 3 days only): Current season, Location of own room, Staff names and faces, That he or she is in a hospital  Medical Problem List and Plan: 1.  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  DVT Prophylaxis/Anticoagulation: Subcutaneous Lovenox. Monitor platelet counts in signs of bleeding 3. Pain Management/chronic back pain. 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 Mood/PTSD. Zoloft 50 mg daily, Requip 0.25 mg 3 times a day 5. Neuropsych: This patient is capable of making decisions on her own behalf. 6. Skin/Wound Care: Routine skin checks, prob tinea pedis R foot started clotrimazole BID 7. Fluids/Electrolytes/Nutrition: Routine I&O's 8. Tobacco alcohol abuse. Counseling. No nicotine craving reported 9. History of GI bleed. Continue Pepcid twice a day.   -Hb 13.4 on 8/1 10.  Hx Alcohol abuse denies ETOH for ~19mo11. 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  Constipation- improved  senna S 13. Sleep disturbance: prn trazodone  LOS (Days) 16 A FACE TO FACE EVALUATION WAS PERFORMED  [name] Lorie Phenix 10/17/2015, 8:18 AM

## 2015-10-17 NOTE — Progress Notes (Signed)
Occupational Therapy Session Note  Patient Details  Name: [name] MRN: [id number] Date of Birth: 03-11-1956  Today's Date: 10/17/2015 OT Individual Time: 1015-1100 OT Individual Time Calculation (min): 45 min     Short Term Goals: Week 3:  OT Short Term Goal 1 (Week 3): Pt will perform shower transfers with min assist to walk-in shower. OT Short Term Goal 2 (Week 3): Pt will complete stand pivot toilet transfers with min assist to the 3:1.   OT Short Term Goal 3 (Week 3): Pt will complete LB dressing with supervision sit to stand. OT Short Term Goal 4 (Week 3): Pt will perform LB bathing with supervision sit to stand.  Skilled Therapeutic Interventions/Progress Updates:    Upon entering the room, pt seated on EOB awaiting therapist with no c/o pain this session. Pt requesting to shower and change clothing. Pt performed stand pivot transfer with mod A. Stand pivot transfer from wheelchair > TTB with mod lifting assistance. Pt bathing self from seated with min cues to utilize L UE for functional tasks. Pt standing to wash buttocks and peri area with min A for balance. Pt returning to wheelchair and dressing with sit <>stand from sink. Pt utilizing mirror with cues from therapist for midline orientation with functional tasks. Mod cues for hemiplegic technique with UB and LB dressing. Pt remained seated in wheelchair with quick release belt donned and call bell within reach upon exiting the room.     Therapy Documentation Precautions:  Precautions Precautions: Fall Precaution Comments: left hemiparesis, pusher to the left Restrictions Weight Bearing Restrictions: No  See Function Navigator for Current Functional Status.   Therapy/Group: Individual Therapy  Lowella Grip 10/17/2015, 12:36 PM

## 2015-10-18 ENCOUNTER — Inpatient Hospital Stay (HOSPITAL_COMMUNITY): Payer: Medicaid Other | Admitting: Occupational Therapy

## 2015-10-18 ENCOUNTER — Inpatient Hospital Stay (HOSPITAL_COMMUNITY): Payer: Medicaid Other | Admitting: Physical Therapy

## 2015-10-18 NOTE — Progress Notes (Signed)
Physical Therapy Note  Patient Details  Name: Vevelyn Francoislice Pedregon MRN: [id number] Date of Birth: 10/31/1955 Today's Date: 10/18/2015    Time: 1000-1115 75 minutes  1:1 no c/o pain. Pt performed w/c propulsion with increased time but pt able to problem solve and propel w/c 2275' with B UEs.  Transfers with min A to both sides, cues for safe UE placement, manual facilitation for hip and trunk control.  Gait with RW 50' x 2 with min/mod A for hip and trunk control, pt able to advance L LE without assistance.  Seated reaching and wt shifting task with focus on Rt trunk elongation and postural alignment. Pt continues to need cues with little carryover during session.  Stair negotiation 8 steps x 2 with Rt handrail ascending, bilat UEs on handrail descending with mod A for eccentric control.  Supine NMR with focus on hip and core control and stability, focusing on keeping trunk in neutral alignment during a variety of tasks. Session ended with nustep for UE//LE strengthening x 6 minutes level 3. Pt is pleased with progress and is hopeful she will be ready for d/c next week.   Marquette Piontek 10/18/2015, 11:15 AM

## 2015-10-18 NOTE — Progress Notes (Signed)
Occupational Therapy Session Note  Patient Details  Name: [name] MRN: [id number] Date of Birth: [birth year]  Today's Date: 10/18/2015 OT Individual Time: [id number] and [id number] OT Individual Time Calculation (min): 32 min and 60 minutes    Short Term Goals: Week 3:  OT Short Term Goal 1 (Week 3): Pt will perform shower transfers with min assist to walk-in shower. OT Short Term Goal 2 (Week 3): Pt will complete stand pivot toilet transfers with min assist to the 3:1.   OT Short Term Goal 3 (Week 3): Pt will complete LB dressing with supervision sit to stand. OT Short Term Goal 4 (Week 3): Pt will perform LB bathing with supervision sit to stand.  Skilled Therapeutic Interventions/Progress Updates:   Pt seen this morning for BADL retraining including decreasing LOA for sit>stands. Pt was agreeable to shower. With w/c set up partway to bathroom, [facility] A for multimodal cues and manual facilitation of weight shifting to improve balance. Near bathroom door, pt sat in w/c and completed shower transfer with Mod A stand pivot with use of grab bars on shower chair. Pt completed UB/LB bathing with Min A for sit to stand. Pt exhibited left postural weakness during dynamic/static sitting. Pt integrated L UE into bathing with min vcs. After showering, pt completed UB/LB dressing while sitting in w/c at sink. Mod questioning provided for carryover of hemi dressing techniques with cues to attend to visual biofeedback. Pt completed sit to stand with supervision for lifting pants over hips.  Max A for Teds. At end of session, pt reported wanting to complete oral care. Pt left w/c level at sink with QRB and nursing communication to attend to pt post oral care for set up in room.   2nd Session 1:1 Tx 32 minutes Pt seen in afternoon session focusing on improving left leaning posture in order to improve dynamic  standing balance. [facility]  tr[facility] activation and right trunk elongation while dissociating trunk from pelvis in a lateral reaching task. Pt provided visual biofeedback to raise postural awareness during static sitting rest breaks with pt able to correct posture with cuing. At end of session  Pt returned to room with all needs within reach and L UE elevated. QRB in place. Pt reported tolerable pain level after being medicated prior to session.   Therapy Documentation Precautions:  Precautions Precautions: Fall Precaution Comments: left hemiparesis, pusher to the left Restrictions Weight Bearing Restrictions: No General:   Vital Signs: Therapy Vitals Temp: 98.6 F (37 C) Temp Source: Oral Pulse Rate: 70 Resp: 18 BP: 120/65 Patient Position (if appropriate): Sitting Oxygen Therapy SpO2: 96 % O2 Device: Not Delivered Pain: C/o pain in back with nursing notified but pt not due for pain medication. Pt provided rest breaks during session to manage pain.  Pain Assessment Pain Assessment: 0-10 Pain Score: 6  Pain Type: Acute pain Pain Location: Shoulder (and headache) Pain Orientation: Left Pain Descriptors / Indicators: Aching Pain Onset: On-going Pain Intervention(s): Medication (See eMAR)     See Function Navigator for Current Functional Status.   Therapy/Group: Individual Therapy  Anthoni Geerts A Iliani Vejar 10/18/2015, 4:22 PM

## 2015-10-18 NOTE — Progress Notes (Signed)
Subjective/Complaints: Pt seen laying in bed.  She again states she is sore from therapies yesterday.  ROS: Denies N/V/D, CP, SOB.  Objective: Vital Signs: Blood pressure (!) 113/58, pulse 72, temperature 98.3 F (36.8 C), temperature source Oral, resp. rate 18, weight 63 kg (138 lb 14.4 oz), SpO2 94 %. No results found. No results found for this or any previous visit (from the past 72 hour(s)).  Gen: NAD. Vital signs reviewed.  HEENT: Normocephalic, atraumatic Cardio: RRR and no murmur Resp: CTA B/L and unlabored GI: BS positive and NT,ND Musc/Skel:  No edema.  No tenderness. Neuro: Alert. Motor 5/5 in RUE and RLE LUE: 4-/5 L delt, bi, tri, grip  LLE: 4-/5 HF KE, 3- ADF/PF  Skin:  Warm and dry. Psych: Flat affect (slightly improved)  Assessment/Plan: 1. Functional deficits secondary to Left hemiparesis which require 3+ hours per day of interdisciplinary therapy in a comprehensive inpatient rehab setting. Physiatrist is providing close team supervision and 24 hour management of active medical problems listed below. Physiatrist and rehab team continue to assess barriers to discharge/monitor patient progress toward functional and medical goals. FIM: Function - Bathing Position: Shower Body parts bathed by patient: Right arm, Left arm, Chest, Abdomen, Front perineal area, Right upper leg, Left upper leg, Right lower leg, Left lower leg, Buttocks Body parts bathed by helper: Back Bathing not applicable: Back Assist Level: Touching or steadying assistance(Pt > 75%)  Function- Upper Body Dressing/Undressing What is the patient wearing?: Bra, Pull over shirt/dress Bra - Perfomed by patient: Thread/unthread right bra strap, Thread/unthread left bra strap, Hook/unhook bra (pull down sports bra) Bra - Perfomed by helper: Hook/unhook bra (pull down sports bra) Pull over shirt/dress - Perfomed by patient: Thread/unthread right sleeve, Thread/unthread left sleeve, Put head through opening,  Pull shirt over trunk Pull over shirt/dress - Perfomed by helper: Thread/unthread left sleeve Assist Level: Supervision or verbal cues Set up : To obtain clothing/put away Function - Lower Body Dressing/Undressing What is the patient wearing?: Shoes, [employer], Underwear, Pants, Socks Position: Research officer, trade union at [employer] - Performed by patient: Thread/unthread right underwear leg, Thread/unthread left underwear leg Underwear - Performed by helper: Pull underwear up/down Pants- Performed by patient: Thread/unthread right pants leg, Thread/unthread left pants leg Pants- Performed by helper: Pull pants up/down Non-skid slipper socks- Performed by patient: Don/doff left sock, Don/doff right sock Non-skid slipper socks- Performed by helper: Don/doff right sock, Don/doff left sock Socks - Performed by patient: Don/doff right sock, Don/doff left sock Shoes - Performed by patient: Don/doff left shoe, Don/doff right shoe Shoes - Performed by helper: Fasten right, Fasten left TED Hose - Performed by patient: Don/doff right TED hose, Don/doff left TED hose TED Hose - Performed by helper: Don/doff right TED hose, Don/doff left TED hose Assist for footwear: Partial/moderate assist Assist for lower body dressing: Touching or steadying assistance (Pt > 75%)  Function - Toileting Toileting steps completed by patient: Performs perineal hygiene Toileting steps completed by helper: Adjust clothing after toileting, Adjust clothing prior to toileting Toileting Assistive Devices: Grab bar or rail Assist level: Touching or steadying assistance (Pt.75%)  Function - Archivist transfer assistive device: Grab bar Assist level to toilet: Touching or steadying assistance (Pt > 75%) Assist level from toilet: Touching or steadying assistance (Pt > 75%) Assist level to bedside commode (at bedside): Touching or steadying assistance (Pt > 75%) Assist level from bedside commode (at bedside):  Touching or steadying assistance (Pt > 75%)  Function - Chair/bed transfer Chair/bed  transfer method: Squat pivot Chair/bed transfer assist level: Touching or steadying assistance (Pt > 75%) Chair/bed transfer assistive device: Armrests Chair/bed transfer details: Verbal cues for technique, Verbal cues for precautions/safety, Verbal cues for safe use of DME/AE, Verbal cues for sequencing  Function - Locomotion: Wheelchair Will patient use wheelchair at discharge?: Yes Type: Manual Max wheelchair distance: 13600ft Assist Level: Supervision or verbal cues Wheel 50 feet with 2 turns activity did not occur: Safety/medical concerns Assist Level: Supervision or verbal cues Wheel 150 feet activity did not occur: Safety/medical concerns Assist Level: Supervision or verbal cues Turns around,maneuvers to table,bed, and toilet,negotiates 3% grade,maneuvers on rugs and over doorsills: No Function - Locomotion: Ambulation Assistive device: Cane-quad Max distance: 9015ft  Assist level: Maximal assist (Pt 25 - 49%) Assist level: Maximal assist (Pt 25 - 49%) Assist level: Moderate assist (Pt 50 - 74%) Walk 10 feet on uneven surfaces activity did not occur: Safety/medical concerns  Function - Comprehension Comprehension: Auditory Comprehension assist level: Follows basic conversation/direction with no assist  Function - Expression Expression: Verbal Expression assist level: Expresses basic needs/ideas: With no assist  Function - Social Interaction Social Interaction assist level: Interacts appropriately 90% of the time - Needs monitoring or encouragement for participation or interaction.  Function - Problem Solving Problem solving assist level: Solves basic 90% of the time/requires cueing < 10% of the time  Function - Memory Memory assist level: Recognizes or recalls 90% of the time/requires cueing < 10% of the time Patient normally able to recall (first 3 days only): Current season, Location of  own room, Staff names and faces, That he or she is in a hospital  Medical Problem List and Plan: 1.  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  DVT Prophylaxis/Anticoagulation: Subcutaneous Lovenox. Monitor platelet counts in signs of bleeding 3. Pain Management/chronic back pain. 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 Mood/PTSD. Zoloft 50 mg daily, Requip 0.25 mg 3 times a day 5. Neuropsych: This patient is capable of making decisions on her own behalf. 6. Skin/Wound Care: Routine skin checks, prob tinea pedis R foot started clotrimazole BID 7. Fluids/Electrolytes/Nutrition: Routine I&O's 8. Tobacco alcohol abuse. Counseling. No nicotine craving reported 9. History of GI bleed. Continue Pepcid twice a day.   -Hb 13.4 on 8/1 10.  Hx Alcohol abuse denies ETOH for ~6073mo 11. 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  Constipation- improved  senna S 13. Sleep disturbance: prn trazodone  LOS (Days) 17 A FACE TO FACE EVALUATION WAS PERFORMED  [name] [name] 10/18/2015, 7:17 AM

## 2015-10-18 NOTE — Plan of Care (Signed)
Problem: RH BOWEL ELIMINATION Goal: RH STG MANAGE BOWEL WITH ASSISTANCE STG Manage Bowel with Assistance. Mod I  Outcome: Not Progressing LBM 8/3-refusing laxatives-educated

## 2015-10-19 ENCOUNTER — Inpatient Hospital Stay (HOSPITAL_COMMUNITY): Payer: Medicaid Other | Admitting: Speech Pathology

## 2015-10-19 ENCOUNTER — Inpatient Hospital Stay (HOSPITAL_COMMUNITY): Payer: Medicaid Other | Admitting: Occupational Therapy

## 2015-10-19 ENCOUNTER — Inpatient Hospital Stay (HOSPITAL_COMMUNITY): Payer: Medicaid Other

## 2015-10-19 MED ORDER — OXYCODONE-ACETAMINOPHEN 7.5-325 MG PO TABS
1.0000 | ORAL_TABLET | Freq: Three times a day (TID) | ORAL | Status: DC
  Administered 2015-10-19 – 2015-10-26 (×21): 1 via ORAL
  Filled 2015-10-19 (×21): qty 1

## 2015-10-19 NOTE — Progress Notes (Signed)
Subjective/Complaints: No new issues except doesn't like schedule  ROS: Denies N/V/D, CP, SOB.  Objective: Vital Signs: Blood pressure (!) 103/54, pulse 69, temperature 97.6 F (36.4 C), temperature source Oral, resp. rate 16, weight 63 kg (138 lb 14.4 oz), SpO2 98 %. No results found. No results found for this or any previous visit (from the past 72 hour(s)).  Gen: NAD. Vital signs reviewed.  HEENT: Normocephalic, atraumatic Cardio: RRR and no murmur Resp: CTA B/L and unlabored GI: BS positive and NT,ND Musc/Skel:  No edema.  No tenderness. Neuro: Alert. Motor 5/5 in RUE and RLE LUE: 4-/5 L delt, bi, tri, grip  LLE: 4-/5 HF KE, 3- ADF/PF  Skin:  Warm and dry. Psych: Flat affect (slightly improved)  Assessment/Plan: 1. Functional deficits secondary to Left hemiparesis which require 3+ hours per day of interdisciplinary therapy in a comprehensive inpatient rehab setting. Physiatrist is providing close team supervision and 24 hour management of active medical problems listed below. Physiatrist and rehab team continue to assess barriers to discharge/monitor patient progress toward functional and medical goals. FIM: Function - Bathing Position: Shower Body parts bathed by patient: Right arm, Left arm, Chest, Abdomen, Front perineal area, Buttocks, Right upper leg, Left upper leg, Right lower leg, Left lower leg, Back Body parts bathed by helper: Back Bathing not applicable: Back Assist Level: Touching or steadying assistance(Pt > 75%)  Function- Upper Body Dressing/Undressing What is the patient wearing?: Bra, Pull over shirt/dress Bra - Perfomed by patient: Thread/unthread right bra strap, Thread/unthread left bra strap Bra - Perfomed by helper: Hook/unhook bra (pull down sports bra) Pull over shirt/dress - Perfomed by patient: Thread/unthread right sleeve, Thread/unthread left sleeve, Put head through opening, Pull shirt over trunk Pull over shirt/dress - Perfomed by helper:  Thread/unthread left sleeve Assist Level: Supervision or verbal cues Set up : To obtain clothing/put away Function - Lower Body Dressing/Undressing What is the patient wearing?: Underwear, Pants, Shoes, Ted Hose Position: Wheelchair/chair at [employer] - Performed by patient: Thread/unthread right underwear leg, Thread/unthread left underwear leg, Pull underwear up/down Underwear - Performed by helper: Pull underwear up/down Pants- Performed by patient: Thread/unthread right pants leg, Thread/unthread left pants leg, Pull pants up/down Pants- Performed by helper: Pull pants up/down Non-skid slipper socks- Performed by patient: Don/doff left sock, Don/doff right sock Non-skid slipper socks- Performed by helper: Don/doff right sock, Don/doff left sock Socks - Performed by patient: Don/doff right sock, Don/doff left sock Shoes - Performed by patient: Don/doff right shoe, Don/doff left shoe Shoes - Performed by helper: Fasten right, Fasten left TED Hose - Performed by patient: Don/doff right TED hose, Don/doff left TED hose TED Hose - Performed by helper: Don/doff right TED hose, Don/doff left TED hose Assist for footwear: Partial/moderate assist Assist for lower body dressing: Touching or steadying assistance (Pt > 75%)  Function - Toileting Toileting steps completed by patient: Performs perineal hygiene Toileting steps completed by helper: Adjust clothing after toileting, Adjust clothing prior to toileting Toileting Assistive Devices: Grab bar or rail Assist level: Touching or steadying assistance (Pt.75%)  Function - Archivist transfer assistive device: Grab bar Assist level to toilet: Touching or steadying assistance (Pt > 75%) Assist level from toilet: Touching or steadying assistance (Pt > 75%) Assist level to bedside commode (at bedside): Touching or steadying assistance (Pt > 75%) Assist level from bedside commode (at bedside): Touching or steadying assistance (Pt  > 75%)  Function - Chair/bed transfer Chair/bed transfer method: Squat pivot Chair/bed transfer assist level:  Moderate assist (Pt 50 - 74%/lift or lower) Chair/bed transfer assistive device: Armrests Chair/bed transfer details: Verbal cues for technique, Verbal cues for precautions/safety, Verbal cues for safe use of DME/AE, Verbal cues for sequencing  Function - Locomotion: Wheelchair Will patient use wheelchair at discharge?: Yes Type: Manual Max wheelchair distance: 17500ft Assist Level: Supervision or verbal cues Wheel 50 feet with 2 turns activity did not occur: Safety/medical concerns Assist Level: Supervision or verbal cues Wheel 150 feet activity did not occur: Safety/medical concerns Assist Level: Supervision or verbal cues Turns around,maneuvers to table,bed, and toilet,negotiates 3% grade,maneuvers on rugs and over doorsills: No Function - Locomotion: Ambulation Assistive device: Cane-quad Max distance: 2415ft  Assist level: Maximal assist (Pt 25 - 49%) Assist level: Maximal assist (Pt 25 - 49%) Assist level: Moderate assist (Pt 50 - 74%) Walk 10 feet on uneven surfaces activity did not occur: Safety/medical concerns  Function - Comprehension Comprehension: Auditory Comprehension assist level: Follows basic conversation/direction with no assist  Function - Expression Expression: Verbal Expression assist level: Expresses basic needs/ideas: With no assist  Function - Social Interaction Social Interaction assist level: Interacts appropriately 90% of the time - Needs monitoring or encouragement for participation or interaction.  Function - Problem Solving Problem solving assist level: Solves basic 90% of the time/requires cueing < 10% of the time  Function - Memory Memory assist level: Recognizes or recalls 90% of the time/requires cueing < 10% of the time Patient normally able to recall (first 3 days only): Current season, Location of own room, Staff names and faces,  That he or she is in a hospital  Medical Problem List and Plan: 1.  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  DVT Prophylaxis/Anticoagulation: Subcutaneous Lovenox. Monitor platelet counts in signs of bleeding 3. Pain Management/chronic back pain. Flexeril 5 mg twice a day, baclofen 10 mg twice a day, Neurontin 300 mg 3 times a day and Percocet as needed.Chronic narcotics as outpt , added kpad for back    -Will schedule oxycodone similar to home dosing 8am, 6pm, 11pm   - tramadol for moderate pain and oxycodone for severe pain 4. Mood/PTSD. Zoloft 50 mg daily, Requip 0.25 mg 3 times a day 5. Neuropsych: This patient is capable of making decisions on her own behalf. 6. Skin/Wound Care: Routine skin checks, prob tinea pedis R foot started clotrimazole BID 7. Fluids/Electrolytes/Nutrition: Routine I&O's 8. Tobacco alcohol abuse. Counseling. No nicotine craving reported 9. History of GI bleed. Continue Pepcid twice a day.   -Hb 13.4 on 8/1 10.  Hx Alcohol abuse denies ETOH for ~9263mo 11. 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  Constipation- improved  senna S 13. Sleep disturbance: prn trazodone  LOS (Days) 18 A FACE TO FACE EVALUATION WAS PERFORMED  Afnan Cadiente E 10/19/2015, 8:18 AM

## 2015-10-19 NOTE — Progress Notes (Signed)
Speech Language Pathology Daily Session Note  Patient Details  Name: [name] MRN: [id number] Date of Birth: 01/10/1956  Today's Date: 10/19/2015 SLP Individual Time: 1502-1530 SLP Individual Time Calculation (min): 28 min   Short Term Goals: Week 3: SLP Short Term Goal 1 (Week 3): Pt will utilize external memory aids to recall novel detailed information with supervision verbal and question cues.  SLP Short Term Goal 2 (Week 3): Pt will demonstrate functional problem solving for moderately complex tasks with min A verbal cues.   Skilled Therapeutic Interventions:  Pt was seen for skilled ST targeting cognitive goals.  SLP facilitated the session with a novel card game targeting use of external memory aids.  Pt required mod assist verbal cues to generate word-picture associations but was able to remember associations with min assist following repetition of information via spaced retrieval training. Supervision verbal cues were needed for recall of previously named category members.  Pt was left in bed at the end of today's therapy session with call bell within reach and bed alarm set.  Continue per current plan of care.   Function:  Eating Eating                 Cognition Comprehension Comprehension assist level: Follows basic conversation/direction with no assist  Expression   Expression assist level: Expresses basic needs/ideas: With no assist  Social Interaction Social Interaction assist level: Interacts appropriately 90% of the time - Needs monitoring or encouragement for participation or interaction.  Problem Solving Problem solving assist level: Solves basic 90% of the time/requires cueing < 10% of the time  Memory Memory assist level: Recognizes or recalls 75 - 89% of the time/requires cueing 10 - 24% of the time    Pain Pain Assessment Pain Assessment: No/denies pain   Therapy/Group: Individual Therapy  Jud Fanguy, Melanee SpryNicole L 10/19/2015, 4:05 PM

## 2015-10-19 NOTE — Progress Notes (Addendum)
Physical Therapy Note  Patient Details  Name: [name] MRN: [id number] Date of Birth: 1955/07/01 Today's Date: 10/19/2015  1030-1130, 60 min individual tx Pain:none noted  tx 1:  neuromuscular re-education via forced use, manual and VCs for alternating reciprocal movements x 4 extremities on NuStep x 10 minutes at level 4. Therapeutic activities : w/c>< NuStep to R/L stand pivot with min assist, without pushing toward hemi side.  Standing with wt shifting L><R with ACE L ankle, without pushing to hemi side.  Pt needs cues to extend L knee with LLE wt bearing.   Gait with grocery cart, ACE LLE for foot drop and built- up shoe R for better clearance LLE, x 35' with grocery cart mod assist, x 50' with RW mod assist with cues each step for shifting R. Pt left resting in w/c with quick release belt applied and all needs within reach.  tx 2:  Maisie Fushomas Page here from [employer] for L AFO assessment. He trimmed size S Matrix Max AFO with anterior pad to decrease buckling.  Gait with AFO on, x 30' x 2 with mod assist for wt shift to R and steering RW. AFO controls pt's knee well.  Maisie Fushomas will return tomorrow with pt's AFO.  neuromuscular re-education via forced use, manual cues for diagonal to R wt shift in order to ascend steps with step through pattern, mod assist.  Pt tends to over -use R hand to pull and push herself. Up/down curb/threshold with RW, mod/max assist for wt shifting, use of RW.  Pt stated she needed to use toilet for BM; pt continent of urine but did not have BM. Toilet transfer in room. Pt left resting in bed with alarm set and all needs within reach.  Sylus Stgermain 10/19/2015, 7:57 AM

## 2015-10-19 NOTE — Plan of Care (Signed)
Problem: RH BOWEL ELIMINATION Goal: RH STG MANAGE BOWEL WITH ASSISTANCE STG Manage Bowel with Assistance. Mod I  Outcome: Not Progressing Patient required sorbitol and prune juice for BM today Goal: RH STG MANAGE BOWEL W/MEDICATION W/ASSISTANCE STG Manage Bowel with Medication with Assistance.  Mod I  Outcome: Not Progressing Patient required sorbitol and prune juice for BM today

## 2015-10-19 NOTE — Progress Notes (Signed)
Occupational Therapy Session Note  Patient Details  Name: [name] MRN: [id number] Date of Birth: 01-26-1956  Today's Date: 10/19/2015 OT Individual Time: [id number] OT Individual Time Calculation (min): 61 min   Skilled Therapeutic Interventions/Progress Updates:   Pt was sitting up in w/c upon skilled OT arrival requesting a shower with clothes in lap. Pt reported gatheirng ADL items w/c level prior to arrival. Pt completed functional shower chair transfer from w/c with steadying assistance and instruction on hand placement. Pt completed bathing integrating L UE with min cues. Towel wedge placed under left hip with improved midline orientation. Per pt "It helps. I would tell you if it didn't." Pt was able to complete LB bathing while standing with close supervision and instruction on alternating hands for maintaining unilateral support. Pt completed dressing w/c level at sink. Mod cues still required for carryover of hemi techniques. Steadying assistance required while standing to pull up pants with instruction to attend to visual biofeedback and alternate between hands for unilateral support. Pt attempts to remove both UEs off of sink and experienced 2 small LOBs with therapist correction. At end of session, pt left in w/c with all needs within reach, lap tray, and QRB. Pt continues to benefit from improving dynamic standing balance.   Therapy Documentation Precautions:  Precautions Precautions: Fall Precaution Comments: left hemiparesis, pusher to the left Restrictions Weight Bearing Restrictions: No  Pain: Pt reported pain was manageable with rest breaks today Pain Assessment Pain Assessment: 0-10 Pain Score: 7  Pain Type: Acute pain Pain Location: Generalized Pain Descriptors / Indicators: Aching Pain Frequency: Constant Pain Onset: On-going Patients Stated Pain Goal: 3 Pain Intervention(s): Medication (See eMAR)     See Function Navigator for Current Functional  Status.   Therapy/Group: Individual Therapy  [name] A [name] 10/19/2015, 12:36 PM

## 2015-10-20 ENCOUNTER — Inpatient Hospital Stay (HOSPITAL_COMMUNITY): Payer: Medicaid Other | Admitting: Occupational Therapy

## 2015-10-20 ENCOUNTER — Inpatient Hospital Stay (HOSPITAL_COMMUNITY): Payer: Medicaid Other | Admitting: Physical Therapy

## 2015-10-20 ENCOUNTER — Inpatient Hospital Stay (HOSPITAL_COMMUNITY): Payer: Medicaid Other | Admitting: Speech Pathology

## 2015-10-20 NOTE — Progress Notes (Signed)
Subjective/Complaints: No pain issues, slept well, good appetite, urinary frequency, but no dysuria  ROS: Denies N/V/D, CP, SOB.  Objective: Vital Signs: Blood pressure 126/65, pulse 71, temperature 97.5 F (36.4 C), temperature source Oral, resp. rate 16, weight 63 kg (138 lb 14.4 oz), SpO2 99 %. No results found. No results found for this or any previous visit (from the past 72 hour(s)).  Gen: NAD. Vital signs reviewed.  HEENT: Normocephalic, atraumatic Cardio: RRR and no murmur Resp: CTA B/L and unlabored GI: BS positive and NT,ND Musc/Skel:  No edema.  No tenderness. Neuro: Alert. Motor 5/5 in RUE and RLE LUE: 4-/5 L delt, bi, tri, grip  LLE: 4-/5 HF KE, 3- ADF/PF  Skin:  Warm and dry. Psych: Flat affect (slightly improved)  Assessment/Plan: 1. Functional deficits secondary to Left hemiparesis which require 3+ hours per day of interdisciplinary therapy in a comprehensive inpatient rehab setting. Physiatrist is providing close team supervision and 24 hour management of active medical problems listed below. Physiatrist and rehab team continue to assess barriers to discharge/monitor patient progress toward functional and medical goals. FIM: Function - Bathing Position: Shower Body parts bathed by patient: Right arm, Left arm, Chest, Abdomen, Front perineal area, Buttocks, Right upper leg, Left upper leg, Right lower leg, Left lower leg, Back Body parts bathed by helper: Back Bathing not applicable: Back Assist Level: Supervision or verbal cues  Function- Upper Body Dressing/Undressing What is the patient wearing?: Bra, Pull over shirt/dress Bra - Perfomed by patient: Thread/unthread right bra strap, Thread/unthread left bra strap, Hook/unhook bra (pull down sports bra) Bra - Perfomed by helper: Hook/unhook bra (pull down sports bra) Pull over shirt/dress - Perfomed by patient: Thread/unthread right sleeve, Thread/unthread left sleeve, Put head through opening, Pull shirt over  trunk Pull over shirt/dress - Perfomed by helper: Thread/unthread left sleeve Assist Level: Supervision or verbal cues Set up : To obtain clothing/put away Function - Lower Body Dressing/Undressing What is the patient wearing?: Underwear, Pants, Faythe Dingwalled Hose, Shoes Position: Research officer, trade unionWheelchair/chair at [employer] Underwear - Performed by patient: Thread/unthread right underwear leg, Thread/unthread left underwear leg Underwear - Performed by helper: Pull underwear up/down Pants- Performed by patient: Thread/unthread right pants leg, Thread/unthread left pants leg, Pull pants up/down Pants- Performed by helper: Pull pants up/down Non-skid slipper socks- Performed by patient: Don/doff left sock, Don/doff right sock Non-skid slipper socks- Performed by helper: Don/doff right sock, Don/doff left sock Socks - Performed by patient: Don/doff right sock, Don/doff left sock Shoes - Performed by patient: Don/doff right shoe, Don/doff left shoe, Fasten right, Fasten left Shoes - Performed by helper: Fasten right, Fasten left TED Hose - Performed by patient: Don/doff right TED hose, Don/doff left TED hose TED Hose - Performed by helper: Don/doff right TED hose, Don/doff left TED hose Assist for footwear: Partial/moderate assist Assist for lower body dressing: Touching or steadying assistance (Pt > 75%)  Function - Toileting Toileting steps completed by patient: Performs perineal hygiene Toileting steps completed by helper: Adjust clothing prior to toileting, Adjust clothing after toileting Toileting Assistive Devices: Grab bar or rail Assist level: Touching or steadying assistance (Pt.75%)  Function - ArchivistToilet Transfers Toilet transfer assistive device: Grab bar Assist level to toilet: Touching or steadying assistance (Pt > 75%) Assist level from toilet: Touching or steadying assistance (Pt > 75%) Assist level to bedside commode (at bedside): Touching or steadying assistance (Pt > 75%) Assist level from bedside  commode (at bedside): Touching or steadying assistance (Pt > 75%)  Function - Chair/bed transfer  Chair/bed transfer method: Squat pivot Chair/bed transfer assist level: Moderate assist (Pt 50 - 74%/lift or lower) Chair/bed transfer assistive device: Armrests Chair/bed transfer details: Verbal cues for technique, Verbal cues for precautions/safety, Verbal cues for safe use of DME/AE, Verbal cues for sequencing  Function - Locomotion: Wheelchair Will patient use wheelchair at discharge?: Yes Type: Manual Max wheelchair distance: 170ft Assist Level: Supervision or verbal cues Wheel 50 feet with 2 turns activity did not occur: Safety/medical concerns Assist Level: Supervision or verbal cues Wheel 150 feet activity did not occur: Safety/medical concerns Assist Level: Supervision or verbal cues Turns around,maneuvers to table,bed, and toilet,negotiates 3% grade,maneuvers on rugs and over doorsills: No Function - Locomotion: Ambulation Assistive device: Cane-quad Max distance: 13ft  Assist level: Maximal assist (Pt 25 - 49%) Assist level: Maximal assist (Pt 25 - 49%) Assist level: Moderate assist (Pt 50 - 74%) Walk 10 feet on uneven surfaces activity did not occur: Safety/medical concerns  Function - Comprehension Comprehension: Auditory Comprehension assist level: Follows basic conversation/direction with no assist  Function - Expression Expression: Verbal Expression assist level: Expresses basic needs/ideas: With no assist  Function - Social Interaction Social Interaction assist level: Interacts appropriately 90% of the time - Needs monitoring or encouragement for participation or interaction.  Function - Problem Solving Problem solving assist level: Solves basic 90% of the time/requires cueing < 10% of the time  Function - Memory Memory assist level: Recognizes or recalls 75 - 89% of the time/requires cueing 10 - 24% of the time Patient normally able to recall (first 3 days  only): Current season, Location of own room, Staff names and faces, That he or she is in a hospital  Medical Problem List and Plan: 1.  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 length of stay extended to work on stairs, which will be needed for home discharge, tentative discharge 8/14  -AFO  -left heel cord tight, improving 2.  DVT Prophylaxis/Anticoagulation: Subcutaneous Lovenox. Monitor platelet counts in signs of bleeding 3. Pain Management/chronic back pain. Flexeril 5 mg twice a day, baclofen 10 mg twice a day, Neurontin 300 mg 3 times a day and Percocet as needed.Chronic narcotics as outpt , added kpad for back    -Will schedule oxycodone similar to home dosing 8am, 6pm, 11pm   - tramadol for moderate pain and oxycodone for severe pain 4. Mood/PTSD. Zoloft 50 mg daily, Requip 0.25 mg 3 times a day 5. Neuropsych: This patient is capable of making decisions on her own behalf. 6. Skin/Wound Care: Routine skin checks, prob tinea pedis R foot started clotrimazole BID 7. Fluids/Electrolytes/Nutrition: Routine I&O's 8. Tobacco alcohol abuse. Counseling. No nicotine craving reported 9. History of GI bleed. Continue Pepcid twice a day.   -Hb 13.4 on 8/1 10.  Hx Alcohol abuse denies ETOH for ~96mo 11. 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 Check PVRs  -Will cont to follow, considerably improved per documentation, 7 voids yesterday 12.  Constipation- improved  senna S 13. Sleep disturbance: prn trazodone  LOS (Days) 19 A FACE TO FACE EVALUATION WAS PERFORMED  [name] E 10/20/2015, 7:53 AM

## 2015-10-20 NOTE — Progress Notes (Signed)
Speech Language Pathology Daily Session Note  Patient Details  Name: [name] MRN: [id number] Date of Birth: 16-Feb-1956  Today's Date: 10/20/2015 SLP Individual Time: 1300-1400 SLP Individual Time Calculation (min): 60 min   Short Term Goals: Week 3: SLP Short Term Goal 1 (Week 3): Pt will utilize external memory aids to recall novel detailed information with supervision verbal and question cues.  SLP Short Term Goal 2 (Week 3): Pt will demonstrate functional problem solving for moderately complex tasks with min A verbal cues.   Skilled Therapeutic Interventions:  Pt was seen for skilled ST targeting cognitive goals.  SLP discussed compensatory memory strategies to facilitate recall of daily information, emphasizing environmental organization, visual reminders, routines, and use of calendars.  Pt verbalizing understanding and all questions were answered to her satisfaction at this time. Pt was 80% accurate with min assist-supervision for repetition of information during working Optician, dispensingmemory drills.  Pt was also 100% accurate for verbal problem solving during structured tasks with supervision question cues.  Pt could also  anticipate potential barriers during familiar home management tasks and generate solutions with supervision.  Pt was returned to room and left with call bell within reach and quick release belt donned for safety.  Continue per current plan of care.    Function:  Eating Eating                Cognition Comprehension Comprehension assist level: Follows basic conversation/direction with no assist  Expression   Expression assist level: Expresses basic needs/ideas: With no assist  Social Interaction Social Interaction assist level: Interacts appropriately 90% of the time - Needs monitoring or encouragement for participation or interaction.  Problem Solving Problem solving assist level: Solves basic 90% of the time/requires cueing < 10% of the time  Memory Memory assist level:  Recognizes or recalls 90% of the time/requires cueing < 10% of the time    Pain Pain Assessment Pain Assessment: No/denies pain  Therapy/Group: Individual Therapy  Markeisha Mancias, Melanee SpryNicole L 10/20/2015, 2:03 PM

## 2015-10-20 NOTE — Progress Notes (Signed)
Physical Therapy Session Note  Patient Details  Name: [name] MRN: [id number] Date of Birth: [birth year]  Today's Date: 10/20/2015 PT Individual Time: 1030-1058 and 1515-1620 PT Individual Time Calculation (min): 28 min and 65 min   Short Term Goals: Week 3:  PT Short Term Goal 1 (Week 3): Pt will perform bed mobility and transfers with supervision PT Short Term Goal 2 (Week 3): Pt will perform w/c mobility x 150' with supervision in controlled and home environments PT Short Term Goal 3 (Week 3): Pt will perform gait x 25' with LRAD and min A in controlled environment PT Short Term Goal 4 (Week 3): Pt will negotiate 12 stairs with min A and R rail  Skilled Therapeutic Interventions/Progress Updates:    Treatment 1: Patient in wheelchair upon arrival, requesting to toilet. Performed stand pivot to/from toilet using grab bar with min A. Patient selected shorts from dresser at wheelchair level, after initiating dressing with RLE, patient self-corrected and performed LB dressing hemi technique from wheelchair level with total A to don TED hose and shoes/AFO. Transfer training with patient for NT/RN orientation for stand pivot transfers using RW and squat pivot transfers with focus on hand placement, head hips relationship, and anterior weight shift. Patient left sitting in wheelchair with quick release belt and all needs in reach.  Treatment 2: Patient in wheelchair upon arrival with shoes and personal AFO donned. Patient elected to propel wheelchair using BUE with cues for technique and supervision-min A x 100 ft with extra time. Patient negotiated up/down 8 (6") stairs using 2 rails with mod A overall due to pushing to L and cues for step-to pattern sequencing. Gait training using RW x 40 ft including turns with cues for upright posture, increased L step length, and assist for weight shift to R to clear LLE. Performed [employer] in standing multiple 60 sec trials to facilitate weight shifting to R  using R upper and lower quadrants only with supervision-min A for balance with LUE support on RW and 13-15 hits each trial. In room, performed stand pivot to toilet using grab bar with min A, handoff to NT.   Therapy Documentation Precautions:  Precautions Precautions: Fall Precaution Comments: left hemiparesis, pusher to the left Restrictions Weight Bearing Restrictions: No Pain: Pain Assessment Pain Assessment: No/denies pain    See Function Navigator for Current Functional Status.   Therapy/Group: Individual Therapy  Kerney ElbeVarner, Raffaele Derise A 10/20/2015, 12:26 PM

## 2015-10-20 NOTE — Progress Notes (Signed)
Occupational Therapy Session Note  Patient Details  Name: [name] MRN: [id number] Date of Birth: 07/26/1955  Today's Date: 10/20/2015 OT Individual Time: 1103-1204 OT Individual Time Calculation (min): 61 min    Short Term Goals: Week 2:  OT Short Term Goal 1 (Week 2): Pt will complete toilet transfers with min assist using LRAD. OT Short Term Goal 1 - Progress (Week 2): Not met OT Short Term Goal 2 (Week 2): Pt will perform shower transfers with min assist to walk-in shower. OT Short Term Goal 2 - Progress (Week 2): Not met OT Short Term Goal 3 (Week 2): Pt will complete stand pivot toilet transfers with min assist to the 3:1.   OT Short Term Goal 3 - Progress (Week 2): Not met OT Short Term Goal 4 (Week 2): Pt will perform LB dressing with min assist sit to stand.  OT Short Term Goal 4 - Progress (Week 2): Met OT Short Term Goal 5 (Week 2): Pt will use the LUE as an active assist for pulling up pants and tying shoes with supervision.  OT Short Term Goal 5 - Progress (Week 2): Met Week 3:  OT Short Term Goal 1 (Week 3): Pt will perform shower transfers with min assist to walk-in shower. OT Short Term Goal 2 (Week 3): Pt will complete stand pivot toilet transfers with min assist to the 3:1.   OT Short Term Goal 3 (Week 3): Pt will complete LB dressing with supervision sit to stand. OT Short Term Goal 4 (Week 3): Pt will perform LB bathing with supervision sit to stand.  Skilled Therapeutic Interventions/Progress Updates:   Pt greeted in w/c after gathering clothes at w/c level for dressing after shower. Pt completed shower transfer with min A for steadying and completed LB and UB bathing with supervision utilizing B U E s. Discussed home bathroom set-up, and pt reports her w/c will not fit into the bathroom. Pt required  RW to stand from shower chair and ambulate short distance to w/c with Mod>Max A from OT w/ lateral push to L and L knee buckling. Towel roll placed under L hip in w/c to  bring pelvic position and trunk to midline. Pt then completed LB and UB dressing, requiring min verbal cues for hemi techniques and Min A to maintain standing balance when pulling up pants. Pt experienced 1 near LOB during LB dressing in standing, requiring min A to correct balance. Pt left seated in w/c at end of session with safety belt on and lunch tray.  Therapy Documentation Precautions:  Precautions Precautions: Fall Precaution Comments: left hemiparesis, pusher to the left Restrictions Weight Bearing Restrictions: No Pain: Pain Assessment Pain Assessment: No/denies pain   See Function Navigator for Current Functional Status.   Therapy/Group: Individual Therapy  Binnie Rail 10/20/2015, 12:30 PM

## 2015-10-20 NOTE — Progress Notes (Signed)
Orthopedic Tech Progress Note Patient Details:  [name] May 20, 1955 [id number] Brace order completed by hanger vendor. Patient ID: [name], female   DOB: May 20, 1955, 60 y.o.   MRN: [id number]   [name], [name] 10/20/2015, 4:41 PM

## 2015-10-21 ENCOUNTER — Inpatient Hospital Stay (HOSPITAL_COMMUNITY): Payer: Medicaid Other | Admitting: Physical Therapy

## 2015-10-21 ENCOUNTER — Inpatient Hospital Stay (HOSPITAL_COMMUNITY): Payer: Medicaid Other | Admitting: Occupational Therapy

## 2015-10-21 ENCOUNTER — Inpatient Hospital Stay (HOSPITAL_COMMUNITY): Payer: Medicaid Other

## 2015-10-21 ENCOUNTER — Inpatient Hospital Stay (HOSPITAL_COMMUNITY): Payer: Medicaid Other | Admitting: Speech Pathology

## 2015-10-21 MED ORDER — OXYBUTYNIN CHLORIDE ER 10 MG PO TB24
10.0000 mg | ORAL_TABLET | Freq: Every day | ORAL | Status: DC
  Administered 2015-10-21: 10 mg via ORAL
  Filled 2015-10-21: qty 1

## 2015-10-21 MED ORDER — BACLOFEN 5 MG HALF TABLET
5.0000 mg | ORAL_TABLET | Freq: Two times a day (BID) | ORAL | Status: DC
  Administered 2015-10-21 – 2015-10-23 (×4): 5 mg via ORAL
  Filled 2015-10-21 (×4): qty 1

## 2015-10-21 NOTE — Progress Notes (Signed)
Subjective/Complaints: Still with urinary frequency only 2 recorded voids in the last 8 hours. However, patient states she is voided more. She is on oxybutynin 10 mg at night. Has some daytime frequent voids as well. No spasticity complaints  ROS: Denies N/V/D, CP, SOB.  Objective: Vital Signs: Blood pressure (!) 118/58, pulse 70, temperature 98.4 F (36.9 C), temperature source Oral, resp. rate 18, weight 63 kg (138 lb 14.4 oz), SpO2 98 %. No results found. No results found for this or any previous visit (from the past 72 hour(s)).  Gen: NAD. Vital signs reviewed.  HEENT: Normocephalic, atraumatic Cardio: RRR and no murmur Resp: CTA B/L and unlabored GI: BS positive and NT,ND Musc/Skel:  No edema.  No tenderness. Neuro: Alert. Motor 5/5 in RUE and RLE LUE: 4-/5 L delt, bi, tri, grip  LLE: 4-/5 HF KE, 3- ADF/PF  Skin:  Warm and dry. Psych: Flat affect (slightly improved)  Assessment/Plan: 1. Functional deficits secondary to Left hemiparesis which require 3+ hours per day of interdisciplinary therapy in a comprehensive inpatient rehab setting. Physiatrist is providing close team supervision and 24 hour management of active medical problems listed below. Physiatrist and rehab team continue to assess barriers to discharge/monitor patient progress toward functional and medical goals. FIM: Function - Bathing Position: Shower Body parts bathed by patient: Right arm, Left arm, Chest, Abdomen, Front perineal area, Buttocks, Right upper leg, Left upper leg, Right lower leg, Left lower leg Body parts bathed by helper: Back Bathing not applicable: Back Assist Level: Touching or steadying assistance(Pt > 75%)  Function- Upper Body Dressing/Undressing What is the patient wearing?: Bra, Pull over shirt/dress Bra - Perfomed by patient: Thread/unthread right bra strap, Thread/unthread left bra strap, Hook/unhook bra (pull down sports bra) Bra - Perfomed by helper: Hook/unhook bra (pull down  sports bra) Pull over shirt/dress - Perfomed by patient: Thread/unthread right sleeve, Thread/unthread left sleeve, Put head through opening, Pull shirt over trunk Pull over shirt/dress - Perfomed by helper: Thread/unthread left sleeve Assist Level: Supervision or verbal cues Set up : To obtain clothing/put away Function - Lower Body Dressing/Undressing What is the patient wearing?: Underwear, Pants, Shoes, [employer], AFO, Socks Position: Education officer, museum at [employer] - Performed by patient: Thread/unthread right underwear leg, Thread/unthread left underwear leg Underwear - Performed by helper: Pull underwear up/down Pants- Performed by patient: Thread/unthread right pants leg, Thread/unthread left pants leg Pants- Performed by helper: Pull pants up/down Non-skid slipper socks- Performed by patient: Don/doff left sock, Don/doff right sock Non-skid slipper socks- Performed by helper: Don/doff right sock, Don/doff left sock Socks - Performed by patient: Don/doff right sock, Don/doff left sock Shoes - Performed by patient: Don/doff left shoe, Don/doff right shoe, Fasten right, Fasten left Shoes - Performed by helper: Fasten right, Fasten left AFO - Performed by helper: Don/doff left AFO TED Hose - Performed by patient: Don/doff right TED hose, Don/doff left TED hose TED Hose - Performed by helper: Don/doff right TED hose, Don/doff left TED hose Assist for footwear: Supervision/touching assist Assist for lower body dressing: Touching or steadying assistance (Pt > 75%)  Function - Toileting Toileting steps completed by patient: Adjust clothing prior to toileting, Performs perineal hygiene Toileting steps completed by helper: Adjust clothing after toileting Toileting Assistive Devices: Grab bar or rail Assist level: Touching or steadying assistance (Pt.75%)  Function - Air cabin crew transfer assistive device: Grab bar Assist level to toilet: Touching or steadying assistance (Pt  > 75%) Assist level from toilet: Touching or steadying assistance (Pt >  75%) Assist level to bedside commode (at bedside): Touching or steadying assistance (Pt > 75%) Assist level from bedside commode (at bedside): Touching or steadying assistance (Pt > 75%)  Function - Chair/bed transfer Chair/bed transfer method: Stand pivot Chair/bed transfer assist level: Moderate assist (Pt 50 - 74%/lift or lower) Chair/bed transfer assistive device: Walker Chair/bed transfer details: Verbal cues for technique, Verbal cues for precautions/safety, Verbal cues for safe use of DME/AE, Verbal cues for sequencing  Function - Locomotion: Wheelchair Will patient use wheelchair at discharge?: Yes Type: Manual Max wheelchair distance: 13600ft Assist Level: Touching or steadying assistance (Pt > 75%), Supervision or verbal cues Wheel 50 feet with 2 turns activity did not occur: Safety/medical concerns Assist Level: Touching or steadying assistance (Pt > 75%) Wheel 150 feet activity did not occur: Safety/medical concerns Assist Level: Supervision or verbal cues Turns around,maneuvers to table,bed, and toilet,negotiates 3% grade,maneuvers on rugs and over doorsills: No Function - Locomotion: Ambulation Assistive device: Walker-rolling, Orthosis Max distance: 40 Assist level: Moderate assist (Pt 50 - 74%) Assist level: Moderate assist (Pt 50 - 74%) Assist level: Moderate assist (Pt 50 - 74%) Walk 10 feet on uneven surfaces activity did not occur: Safety/medical concerns  Function - Comprehension Comprehension: Auditory Comprehension assist level: Follows basic conversation/direction with no assist  Function - Expression Expression: Verbal Expression assist level: Expresses basic needs/ideas: With no assist  Function - Social Interaction Social Interaction assist level: Interacts appropriately 90% of the time - Needs monitoring or encouragement for participation or interaction.  Function - Problem  Solving Problem solving assist level: Solves basic 90% of the time/requires cueing < 10% of the time  Function - Memory Memory assist level: Recognizes or recalls 75 - 89% of the time/requires cueing 10 - 24% of the time Patient normally able to recall (first 3 days only): Current season, Location of own room, Staff names and faces, That he or she is in a hospital  Medical Problem List and Plan: 1.  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 length of stay extended to work on stairs, which will be needed for home discharge, tentative discharge 8/14  -AFO  -left heel cord tight, improving 2.  DVT Prophylaxis/Anticoagulation: Subcutaneous Lovenox. Monitor platelet counts in signs of bleeding 3. Pain Management/chronic back pain. Flexeril 5 mg twice a day, baclofen 10 mg twice a day,significant spasticity. We will reduce to 5 mg, Neurontin 300 mg 3 times a day and Percocet as needed.Chronic narcotics as outpt , added kpad for back    -Will schedule oxycodone similar to home dosing 8am, 6pm, 11pm   - tramadol for moderate pain and oxycodone for severe pain 4. Mood/PTSD. Zoloft 50 mg daily, Requip 0.25 mg 3 times a day 5. Neuropsych: This patient is capable of making decisions on her own behalf. 6. Skin/Wound Care: Routine skin checks, prob tinea pedis R foot started clotrimazole BID 7. Fluids/Electrolytes/Nutrition: Routine I&O's 8. Tobacco alcohol abuse. Counseling. No nicotine craving reported 9. History of GI bleed. Continue Pepcid twice a day.   -Hb 13.4 on 8/1 10.  Hx Alcohol abuse denies ETOH for ~288mo 11. 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  Constipation- improved  senna S 13. Sleep  disturbance: prn trazodone  LOS (  Days) 20 A FACE TO FACE EVALUATION WAS PERFORMED  KIRSTEINS,ANDREW E 10/21/2015, 9:05 AM

## 2015-10-21 NOTE — Progress Notes (Signed)
Occupational Therapy Session Note  Patient Details  Name: [name] MRN: [id number] Date of Birth: 1955/08/18  Today's Date: 10/21/2015 OT Individual Time: 1045-1200 OT Individual Time Calculation (min): 75 min     Short Term Goals: Week 3:  OT Short Term Goal 1 (Week 3): Pt will perform shower transfers with min assist to walk-in shower. OT Short Term Goal 2 (Week 3): Pt will complete stand pivot toilet transfers with min assist to the 3:1.   OT Short Term Goal 3 (Week 3): Pt will complete LB dressing with supervision sit to stand. OT Short Term Goal 4 (Week 3): Pt will perform LB bathing with supervision sit to stand.  Skilled Therapeutic Interventions/Progress Updates:    Pt seen for skilled OT session focusing on self care. Pt sitting in w/c and agreeable to tx session upon arrival. Pt transferred to/from shower chair via stand pivot and grab bars with increased assist for balance. Pt completed shower with min A sit to stand to wash buttocks. OT educated pt on performing lateral leans, but pt feels sit to stand is easier. OT recommends installing grab bars and pt said niece is planning to. Pt completed grooming/dressing at sink needing VC for hemi technique and increased assist for LB dressing. Pt vision (legally blind, but does not wear glasses) seems to be problematic for pt donning shoes due to decreased figure ground discrimination therefore recommend primary OT try white button hook in future sessions.  Pt stood with block under LLE to encourage pt to weight shift to the right side as pt exhibited L lean during dynamic standing grooming activities utilizing left hand. Pt did require min A for balance during these activities and VC to look in mirror to self correct. Pt left sitting in w/c with quick release belt donned with all needs met and educated on using call bell.   Therapy Documentation Precautions:  Precautions Precautions: Fall Precaution Comments: left hemiparesis, pusher  to the left Restrictions Weight Bearing Restrictions: No Pain:  Denies Pain  See Function Navigator for Current Functional Status.   Therapy/Group: Individual Therapy  Matilde Bash 10/21/2015, 12:11 PM

## 2015-10-21 NOTE — Progress Notes (Signed)
Social Work Patient ID: [name], female   DOB: [birth year], 60 y.o.   MRN: [id number]   Met with pt and spoke with niece-[name] to discuss team conference progress and the plan for education. She is leaving town tomorrow am  So can only come Monday am at 10:00 to go through any therapies with pt prior to discharge home Monday. Will have pt scheduled then and work on any discharge needs-equipment and follow up therapies. Pt is fine with this and glad she is making progress in therapies.

## 2015-10-21 NOTE — Plan of Care (Signed)
Problem: RH Problem Solving Goal: LTG Patient will demonstrate problem solving for (SLP) LTG:  Patient will demonstrate problem solving for basic/complex daily situations with cues  (SLP)  Outcome: Not Met (add Reason) Supervision needed for complex problem solving   Problem: RH Awareness Goal: LTG: Patient will demonstrate intellectual/emergent (SLP) LTG: Patient will demonstrate intellectual/emergent/anticipatory awareness with assist during a cognitive/linguistic activity  (SLP)  Outcome: Not Met (add Reason) Supervision needed for anticipatory awareness

## 2015-10-21 NOTE — Patient Care Conference (Signed)
Inpatient RehabilitationTeam Conference and Plan of Care Update Date: 10/21/2015   Time: 11:00 AM    Patient Name: [name]      Medical Record Number: [id number]  Date of Birth: 09-23-1955 Sex: Female         Room/Bed: 4W08C/4W08C-01 Payor Info: Payor: MEDICAID [place] / Plan: MEDICAID [place] ACCESS / Product Type: *No Product type* /    Admitting Diagnosis: Stroke   Admit Date/Time:  10/01/2015  5:35 PM Admission Comments: No comment available   Primary Diagnosis:  Left-sided weakness Principal Problem: Left-sided weakness  Patient Active Problem List   Diagnosis Date Noted  . Left hip pain   . History of GI bleed   . Spastic bladder   . Chronic back pain   . ETOH abuse   . Hypokalemia   . PTSD (post-traumatic stress disorder)   . Gastroesophageal reflux disease without esophagitis   . Acute ischemic stroke (HCC) 09/27/2015  . Acute CVA (cerebrovascular accident) (HCC) 09/27/2015  . Tobacco abuse   . Left-sided weakness 09/26/2015  . Acute gastric ulcer   . GI bleed 05/22/2014  . Acute GI bleeding 05/22/2014  . Acute blood loss anemia 05/22/2014  . History of CVA (cerebrovascular accident) 05/22/2014  . Hyperlipidemia 05/22/2014  . Gastrointestinal hemorrhage with melena   . CVA (cerebral infarction) 10/09/2012  . Flank pain 10/09/2012  . Right pontine stroke (HCC) 10/09/2012  . ABDOMINAL PAIN OTHER SPECIFIED SITE 05/17/2010  . ALCOHOL ABUSE 06/16/2007  . COCAINE ABUSE 06/16/2007  . DEPRESSION 06/16/2007  . Essential hypertension 06/16/2007  . CEREBROVASCULAR ACCIDENT 06/16/2007  . ASTHMA 06/16/2007  . GERD 06/16/2007  . PANCREATITIS 11/09/2006  . ESOPHAGITIS, REFLUX 05/23/2006    Expected Discharge Date: Expected Discharge Date: 10/26/15  Team Members Present: Physician leading conference: Dr. Claudette LawsAndrew Kirsteins Social Worker Present: Dossie DerBecky Seldon Barrell, LCSW Nurse Present: Other (comment) Romie Jumper(Chelsey Evans-RN) PT Present: Wanda Plumparoline Cook, PT OT Present: Roney MansJennifer Smith,  Suszanne ConnersT;Sarah Hoxie, OT SLP Present: Jackalyn LombardNicole Page, SLP PPS Coordinator present : Tora DuckMarie Noel, RN, CRRN     Current Status/Progress Goal Weekly Team Focus  Medical   Urinary frequency with occasional incontinence  Home discharge with adequate mobility for steps  Step climbing, managed urinary frequency   Bowel/Bladder   cont x2 with urgency. LBM: 10/20/2015  remain cont x2   continue with plan of care   Swallow/Nutrition/ Hydration             ADL's   supervision UB bathing, Min A LB bathing, Min A dressing, Mod A stand-pivot, increased functional use of L UE, continued pusher syndrome to L-improving  supervision to min A  self-care re-training, neuromuscular re-education, shower transfer training, balance training, pt/family education   Mobility     min/mod due to pushing to the left still. Working with AFO now and provides stability. Need family education   min assist gait needs family education   family education-will need to provide physical assist. Continue to work on gait and transfers.  Communication   mod I   mod I   discontinue goal, no longer needs to be addressed due to pt reaching goal level without intervention    Safety/Cognition/ Behavioral Observations  supervision   mod I for basic   familiar problem solving, memory, and safety awareness    Pain   chronic back/ left hip pain. lidoderm patch and scheduled medications effective   <3  assess and treat qshift and PRN    Skin   right foot with fungus with  cream  no further breakdown/infection while on rehab   assess q shift and PRN       *See Care Plan and progress notes for long and short-term goals.  Barriers to Discharge: See above    Possible Resolutions to Barriers:  Continue rehabilitation, place on long-acting urinary medication    Discharge Planning/Teaching Needs:  Niece to come in for family education this week. Making good progress in therapies and motivated to recover from this stroke      Team Discussion:   Making progress but will still require assistance at discharge. Need niece to come in for education. Pushes still which makes her require assist. AFO helping with foot drag. MD increased her bladder med, checking PVR's. No SP follow up back to baseline.   Revisions to Treatment Plan:  DC 8/14   Continued Need for Acute Rehabilitation Level of Care: The patient requires daily medical management by a physician with specialized training in physical medicine and rehabilitation for the following conditions: Daily direction of a multidisciplinary physical rehabilitation program to ensure safe treatment while eliciting the highest outcome that is of practical value to the patient.: Yes Daily medical management of patient stability for increased activity during participation in an intensive rehabilitation regime.: Yes Daily analysis of laboratory values and/or radiology reports with any subsequent need for medication adjustment of medical intervention for : Urological problems;Neurological problems  Lucy Chris 10/22/2015, 8:56 AM

## 2015-10-21 NOTE — Progress Notes (Signed)
Physical Therapy Session Note  Patient Details  Name: [name] MRN: [id number] Date of Birth: 04-12-1955  Today's Date: 10/21/2015 PT Individual Time: 1345-1430 PT Individual Time Calculation (min): 45 min    Short Term Goals: Week 1:  PT Short Term Goal 1 (Week 1): Pt will consistently perform functional transfers with mod A PT Short Term Goal 1 - Progress (Week 1): Met PT Short Term Goal 2 (Week 1): Pt will gait in controlled environment with max A 25' PT Short Term Goal 2 - Progress (Week 1): Met PT Short Term Goal 3 (Week 1): Pt will negotiate 4 stairs with max A PT Short Term Goal 3 - Progress (Week 1): Met Week 2:  PT Short Term Goal 1 (Week 2): pt will perform w/c mobility 50' in controlled environment with min A PT Short Term Goal 2 (Week 2): Pt will gait 32' with mod A in controlled environment and LRAD PT Short Term Goal 3 (Week 2): Pt will consistently perform transfers with min A to L and R Week 3:  PT Short Term Goal 1 (Week 3): Pt will perform bed mobility and transfers with supervision PT Short Term Goal 2 (Week 3): Pt will perform w/c mobility x 150' with supervision in controlled and home environments PT Short Term Goal 3 (Week 3): Pt will perform gait x 25' with LRAD and min A in controlled environment PT Short Term Goal 4 (Week 3): Pt will negotiate 12 stairs with min A and R rail  Skilled Therapeutic Interventions/Progress Updates:     Patient received with trade off from NT following bathroom transfer.   PT transported patient to rehab gym with total A for energy conservation.  PT instructed patient in stair training with BUE on 2 rails progressing to 2 UE support on 1 rail for 12 stairs. [place] A with BUE support and min A progressing to Mod A.   Gait training instructed by PT for 123f with L AFO, RW and min A progressing to mod A for last 25-30 ft. PT provided tactile stimulation to improved L trunk closure with LLE swing through to encourage improved weight  shift to R and allow increased step height. Gait training also performed for 331fwith min A and mod cues for improved step height on the LLE with RW and AFO.    PT instructed patient in Nustep endurance training for 12 minutes level 3>5. Min cues from PT for increase Steps per min >40. Patient reports level 14 on Borg scale following endurance training.    Throughout treatment patient performed sit<>stand with min-mod A from PT with cues for improved push through BUE and improved posture to prevent pushing to L.   [facility] to [facility] reach and quick release belt in place.     Therapy Documentation Precautions:  Precautions Precautions: Fall Precaution Comments: left hemiparesis, pusher to the left Restrictions Weight Bearing Restrictions: No General:   Vital Signs: Therapy Vitals Temp: 98.5 F (36.9 C) Temp Source: Oral Pulse Rate: 85 Resp: 18 BP: 132/76 Patient Position (if appropriate): Sitting Oxygen Therapy SpO2: 97 % O2 Device: Not Delivered Pain: Pain Assessment Pain Assessment: No/denies pain   See Function Navigator for Current Functional Status.   Therapy/Group: Individual Therapy  AuLorie Phenix/11/2015, 3:19 PM

## 2015-10-21 NOTE — Progress Notes (Addendum)
Physical Therapy Note  Patient Details  Name: Vevelyn Francoislice Dake MRN: [id number] Date of Birth: November 19, 1955 Today's Date: 10/21/2015  4098-1191,[id number],50 min individual tx Pain: 7/10 L hip, increased after gait; premedicated  Therapeutic activity: sit> stand for pulling up pants; pt initially had LOB L and had to sit back on bed, on 2nd trial,   min guard assist as pt used bil hands to pull up pants.  neuromuscular re-education via manual cues, VCs, forced use for : alternating reciprocal wt shifting in standing on [place]; bil UE use to propel w/c x 50' with extra time, min assist for steering.  Gait with RW, personal L AFO, x 40' x 2 with mod assist for wt shifting, max assist for balance during turns. 10 MWT = 0.99'/sec;  without AFO 0.79'/sec. Pt doffed L shoe and AFO with supervision.   Pt left resting in w/c with quick release belt applied and all needs within reach.    Kaynen Minner 10/21/2015, 7:54 AM

## 2015-10-21 NOTE — Progress Notes (Signed)
Subjective/Complaints: Still with urinary frequency only 2 recorded voids in the last 8 hours. However, patient states she is voided more. She is on oxybutynin 10 mg at night. Has some daytime frequent voids as well. No spasticity complaints  ROS: Denies N/V/D, CP, SOB.  Objective: Vital Signs: Blood pressure (!) 118/58, pulse 70, temperature 98.4 F (36.9 C), temperature source Oral, resp. rate 18, weight 63 kg (138 lb 14.4 oz), SpO2 98 %. No results found. No results found for this or any previous visit (from the past 72 hour(s)).  Gen: NAD. Vital signs reviewed.  HEENT: Normocephalic, atraumatic Cardio: RRR and no murmur Resp: CTA B/L and unlabored GI: BS positive and NT,ND Musc/Skel:  No edema.  No tenderness. Neuro: Alert. Motor 5/5 in RUE and RLE LUE: 4-/5 L delt, bi, tri, grip  LLE: 4-/5 HF KE, 3- ADF/PF  Skin:  Warm and dry. Psych: Flat affect (slightly improved)  Assessment/Plan: 1. Functional deficits secondary to Left hemiparesis which require 3+ hours per day of interdisciplinary therapy in a comprehensive inpatient rehab setting. Physiatrist is providing close team supervision and 24 hour management of active medical problems listed below. Physiatrist and rehab team continue to assess barriers to discharge/monitor patient progress toward functional and medical goals. FIM: Function - Bathing Position: Shower Body parts bathed by patient: Right arm, Left arm, Chest, Abdomen, Front perineal area, Buttocks, Right upper leg, Left upper leg, Right lower leg, Left lower leg Body parts bathed by helper: Back Bathing not applicable: Back Assist Level: Touching or steadying assistance(Pt > 75%)  Function- Upper Body Dressing/Undressing What is the patient wearing?: Bra, Pull over shirt/dress Bra - Perfomed by patient: Thread/unthread right bra strap, Thread/unthread left bra strap, Hook/unhook bra (pull down sports bra) Bra - Perfomed by helper: Hook/unhook bra (pull down  sports bra) Pull over shirt/dress - Perfomed by patient: Thread/unthread right sleeve, Thread/unthread left sleeve, Put head through opening, Pull shirt over trunk Pull over shirt/dress - Perfomed by helper: Thread/unthread left sleeve Assist Level: Supervision or verbal cues Set up : To obtain clothing/put away Function - Lower Body Dressing/Undressing What is the patient wearing?: Underwear, Pants, Shoes, [employer], AFO, Socks Position: Education officer, museum at [employer] - Performed by patient: Thread/unthread right underwear leg, Thread/unthread left underwear leg Underwear - Performed by helper: Pull underwear up/down Pants- Performed by patient: Thread/unthread right pants leg, Thread/unthread left pants leg Pants- Performed by helper: Pull pants up/down Non-skid slipper socks- Performed by patient: Don/doff left sock, Don/doff right sock Non-skid slipper socks- Performed by helper: Don/doff right sock, Don/doff left sock Socks - Performed by patient: Don/doff right sock, Don/doff left sock Shoes - Performed by patient: Don/doff left shoe, Don/doff right shoe, Fasten right, Fasten left Shoes - Performed by helper: Fasten right, Fasten left AFO - Performed by helper: Don/doff left AFO TED Hose - Performed by patient: Don/doff right TED hose, Don/doff left TED hose TED Hose - Performed by helper: Don/doff right TED hose, Don/doff left TED hose Assist for footwear: Supervision/touching assist Assist for lower body dressing: Touching or steadying assistance (Pt > 75%)  Function - Toileting Toileting steps completed by patient: Adjust clothing prior to toileting, Performs perineal hygiene Toileting steps completed by helper: Adjust clothing after toileting Toileting Assistive Devices: Grab bar or rail Assist level: Touching or steadying assistance (Pt.75%)  Function - Air cabin crew transfer assistive device: Grab bar Assist level to toilet: Touching or steadying assistance (Pt  > 75%) Assist level from toilet: Touching or steadying assistance (Pt >  75%) Assist level to bedside commode (at bedside): Touching or steadying assistance (Pt > 75%) Assist level from bedside commode (at bedside): Touching or steadying assistance (Pt > 75%)  Function - Chair/bed transfer Chair/bed transfer method: Stand pivot Chair/bed transfer assist level: Moderate assist (Pt 50 - 74%/lift or lower) Chair/bed transfer assistive device: Walker Chair/bed transfer details: Verbal cues for technique, Verbal cues for precautions/safety, Verbal cues for safe use of DME/AE, Verbal cues for sequencing  Function - Locomotion: Wheelchair Will patient use wheelchair at discharge?: Yes Type: Manual Max wheelchair distance: 159f Assist Level: Touching or steadying assistance (Pt > 75%), Supervision or verbal cues Wheel 50 feet with 2 turns activity did not occur: Safety/medical concerns Assist Level: Touching or steadying assistance (Pt > 75%) Wheel 150 feet activity did not occur: Safety/medical concerns Assist Level: Supervision or verbal cues Turns around,maneuvers to table,bed, and toilet,negotiates 3% grade,maneuvers on rugs and over doorsills: No Function - Locomotion: Ambulation Assistive device: Walker-rolling, Orthosis Max distance: 40 Assist level: Moderate assist (Pt 50 - 74%) Assist level: Moderate assist (Pt 50 - 74%) Assist level: Moderate assist (Pt 50 - 74%) Walk 10 feet on uneven surfaces activity did not occur: Safety/medical concerns  Function - Comprehension Comprehension: Auditory Comprehension assist level: Follows basic conversation/direction with no assist  Function - Expression Expression: Verbal Expression assist level: Expresses basic needs/ideas: With no assist  Function - Social Interaction Social Interaction assist level: Interacts appropriately 90% of the time - Needs monitoring or encouragement for participation or interaction.  Function - Problem  Solving Problem solving assist level: Solves basic 90% of the time/requires cueing < 10% of the time  Function - Memory Memory assist level: Recognizes or recalls 75 - 89% of the time/requires cueing 10 - 24% of the time Patient normally able to recall (first 3 days only): Current season, Location of own room, Staff names and faces, That he or she is in a hospital  Medical Problem List and Plan: 1.  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 Formulized individual treatment plan based on medical history, underlying problem and comorbidities..Marland Kitchenlength of stay extended to work on stairs, which will be needed for home discharge, tentative discharge 8/14  -AFO  -left heel cord tight, improving 2.  DVT Prophylaxis/Anticoagulation: Subcutaneous Lovenox. Monitor platelet counts in signs of bleeding 3. Pain Management/chronic back pain. Flexeril 5 mg twice a day, baclofen 10 mg twice a day,significant spasticity. We will reduce to 5 mg, Neurontin 300 mg 3 times a day and Percocet as needed.Chronic narcotics as outpt , added kpad for back    -No pain c/os on oxycodone similar to home dosing 8am, 6pm, 11pm   - tramadol for moderate pain and oxycodone for severe pain 4. Mood/PTSD. Zoloft 50 mg daily, Requip 0.25 mg 3 times a day 5. Neuropsych: This patient is capable of making decisions on her own behalf. 6. Skin/Wound Care: Routine skin checks, prob tinea pedis R foot started clotrimazole BID 7. Fluids/Electrolytes/Nutrition: Routine I&O's 8. Tobacco alcohol abuse. Counseling. No nicotine craving reported 9. History of GI bleed. Continue Pepcid twice a day.   -Hb 13.4 on 8/1 10.  Hx Alcohol abuse denies ETOH for ~978mo1. 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  Constipation- improved  senna S 13. Sleep disturbance: prn trazodone  LOS (Days) 20 A FACE TO FACE EVALUATION WAS PERFORMED  KIRSTEINS,ANDREW E 10/21/2015, 9:34 AM

## 2015-10-21 NOTE — Progress Notes (Signed)
Speech Language Pathology Discharge Summary  Patient Details  Name: [name] MRN: [id number] Date of Birth: 06-09-1955  Today's Date: 10/21/2015 SLP Individual Time: 1300-1345 SLP Individual Time Calculation (min): 45 min    Skilled Therapeutic Interventions:  Pt was seen for skilled ST targeting cognitive goals.  SLP facilitated the session with working memory drills targeting use of memory compensatory strategies.  Pt required min assist verbal cues for repetition of information during drills for ~75% accuracy.  Pt was able to recall therapy schedule with mod I, including therapist's names and specific activities of therapy.  Pt was mod I for making change and counting money for 80% accuracy, which improved to 100% accuracy with supervision verbal cues to recognize and correct errors.  SLP reviewed and reinforced use of memory compensatory strategies given pt's reports of long standing memory impairment.  Pt reports she feels she is back to baseline for cognition; as a result, no further ST needs are indicated at this time.  All education was completed and all pt's questions were answered to her satisfaction at this time.  Pt left in room with quick release belt donned and call bell within reach.      Patient has met 3 of 5 long term goals.  Patient to discharge at overall Supervision level.  Reasons goals not met: supervision needed for anticipatory awareness of deficits and complex problem solving.     Clinical Impression/Discharge Summary:   Pt made functional gains while inpatient and is discharging from [place] services having met 3 out of 5 long term goals.  Pt is an overall supervision level assist for complex cognitive tasks due to mild cognitive impairment characterized by decreased recall of information.  Pt has demonstrated improved use of memory compensatory aids and selective attention to tasks while inpatient.  Pt education is complete at this time and pt is being discharged from [place]  services due to return to previous level of function for cognition.    Care Partner:  Caregiver Able to Provide Assistance: Yes  Type of Caregiver Assistance: Physical;Cognitive  Recommendation:  24 hour supervision/assistance      Equipment: none recommended by SLP    Reasons for discharge: Treatment goals met;Other (comment) (pt at baseline for cognition )   Patient/Family Agrees with Progress Made and Goals Achieved: Yes   Function:  Eating Eating                Cognition Comprehension Comprehension assist level: Follows basic conversation/direction with no assist  Expression   Expression assist level: Expresses basic needs/ideas: With no assist  Social Interaction Social Interaction assist level: Interacts appropriately 90% of the time - Needs monitoring or encouragement for participation or interaction.  Problem Solving Problem solving assist level: Solves basic 90% of the time/requires cueing < 10% of the time  Memory Memory assist level: Recognizes or recalls 90% of the time/requires cueing < 10% of the time   Emilio Math 10/21/2015, 3:43 PM

## 2015-10-22 ENCOUNTER — Inpatient Hospital Stay (HOSPITAL_COMMUNITY): Payer: Medicaid Other | Admitting: Physical Therapy

## 2015-10-22 ENCOUNTER — Inpatient Hospital Stay (HOSPITAL_COMMUNITY): Payer: Medicaid Other | Admitting: Occupational Therapy

## 2015-10-22 LAB — URINALYSIS, ROUTINE W REFLEX MICROSCOPIC
BILIRUBIN URINE: NEGATIVE
Glucose, UA: NEGATIVE mg/dL
Hgb urine dipstick: NEGATIVE
Ketones, ur: NEGATIVE mg/dL
NITRITE: NEGATIVE
PROTEIN: NEGATIVE mg/dL
SPECIFIC GRAVITY, URINE: 1.018 (ref 1.005–1.030)
pH: 6.5 (ref 5.0–8.0)

## 2015-10-22 LAB — CREATININE, SERUM
CREATININE: 0.81 mg/dL (ref 0.44–1.00)
GFR calc non Af Amer: >60 mL/min (ref >60)

## 2015-10-22 LAB — URINE MICROSCOPIC-ADD ON: RBC / HPF: NONE SEEN RBC/hpf (ref 0–5)

## 2015-10-22 MED ORDER — OXYBUTYNIN CHLORIDE ER 15 MG PO TB24: 15.0000 mg | ORAL_TABLET | Freq: Every day | ORAL | Status: DC

## 2015-10-22 MED ORDER — LIDOCAINE HCL 2 % EX GEL: CUTANEOUS | Status: DC | PRN

## 2015-10-22 NOTE — Progress Notes (Signed)
Occupational Therapy Session Note  Patient Details  Name: [name] MRN: [id number] Date of Birth: December 06, 1955  Today's Date: 10/22/2015 OT Individual Time: [id number] OT Individual Time Calculation (min): 87 min     Short Term Goals: Week 3:  OT Short Term Goal 1 (Week 3): Pt will perform shower transfers with min assist to walk-in shower. OT Short Term Goal 2 (Week 3): Pt will complete stand pivot toilet transfers with min assist to the 3:1.   OT Short Term Goal 3 (Week 3): Pt will complete LB dressing with supervision sit to stand. OT Short Term Goal 4 (Week 3): Pt will perform LB bathing with supervision sit to stand.  Skilled Therapeutic Interventions/Progress Updates:    Upon entering the room, pt seated in wheelchair at sink performing grooming tasks. Pt agreeable to OT intervention. Pt propelling wheelchair 150' with supervision and min cues with hemiplegic technique to ADL apartment. Pt ambulating with RW on carpeted surface with mod A for weight shift. Pt practiced transfer on and off of low,soft love seat and standard being to simulate home environment. OT propelled pt outside and [facility] with L hand at table. Pt practicing tying pants and buttons on shirt with increased time and encouragement. Pt able to tie laces on pants but unable to button shirt at this time. Pt engaged in finger isolation exercises as well. Pt returning to room by propelling wheelchair 150' back towards room and OT assisting the rest of the way secondary to fatigue. Pt remaining in wheelchair at end of session with call bell and all needed items within reach upon exiting the room.   Therapy Documentation Precautions:  Precautions Precautions: Fall Precaution Comments: left hemiparesis, pusher to the left Restrictions Weight Bearing Restrictions: No General:   Vital Signs: Therapy Vitals Temp: 98.3 F (36.8 C) Temp Source: Oral Pulse Rate: 76 Resp: 17 BP: 127/72 Patient Position (if  appropriate): Sitting Oxygen Therapy SpO2: 98 % O2 Device: Not Delivered Pain: Pain Assessment Pain Score: 2   See Function Navigator for Current Functional Status.   Therapy/Group: Individual Therapy  Lowella Gripittman, Nekita Pita L 10/22/2015, 4:37 PM

## 2015-10-22 NOTE — Progress Notes (Signed)
Subjective/Complaints: Patient complained of increased urinary frequency, just started on Ditropan XL yesterday. Prior to that she was taking Ditropan 10 mg daily at bedtime. No burning with urination. No spasticity complaints  ROS: Denies N/V/D, CP, SOB.  Objective: Vital Signs: Blood pressure 113/70, pulse 74, temperature 98.4 F (36.9 C), temperature source Oral, resp. rate 16, weight 65 kg (143 lb 6.4 oz), SpO2 98 %. No results found. Results for orders placed or performed during the hospital encounter of 10/01/15 (from the past 72 hour(s))  Creatinine, serum     Status: None   Collection Time: 10/22/15  5:00 AM  Result Value Ref Range   Creatinine, Ser 0.81 0.44 - 1.00 mg/dL   GFR calc non Af Amer >60 >60 mL/min   GFR calc Af Amer >60 >60 mL/min    Comment: (NOTE) The eGFR has been calculated using the CKD EPI equation. This calculation has not been validated in all clinical situations. eGFR's persistently <60 mL/min signify possible Chronic Kidney Disease.     Gen: NAD. Vital signs reviewed.  HEENT: Normocephalic, atraumatic Cardio: RRR and no murmur Resp: CTA B/L and unlabored GI: BS positive and NT,ND Musc/Skel:  No edema.  No tenderness. Neuro: Alert. Motor 5/5 in RUE and RLE LUE: 4-/5 L delt, bi, tri, grip  LLE: 4-/5 HF KE, 3- ADF/PF  Skin:  Warm and dry. Psych: Flat affect (slightly improved)  Assessment/Plan: 1. Functional deficits secondary to Left hemiparesis which require 3+ hours per day of interdisciplinary therapy in a comprehensive inpatient rehab setting. Physiatrist is providing close team supervision and 24 hour management of active medical problems listed below. Physiatrist and rehab team continue to assess barriers to discharge/monitor patient progress toward functional and medical goals. FIM: Function - Bathing Position: Shower Body parts bathed by patient: Right arm, Left arm, Chest, Abdomen, Front perineal area, Buttocks, Right upper leg, Left  upper leg, Right lower leg, Left lower leg Body parts bathed by helper: Back Bathing not applicable: Back Assist Level: Touching or steadying assistance(Pt > 75%)  Function- Upper Body Dressing/Undressing What is the patient wearing?: Bra, Pull over shirt/dress Bra - Perfomed by patient: Thread/unthread right bra strap, Thread/unthread left bra strap Bra - Perfomed by helper: Hook/unhook bra (pull down sports bra) Pull over shirt/dress - Perfomed by patient: Thread/unthread right sleeve, Thread/unthread left sleeve, Put head through opening, Pull shirt over trunk Pull over shirt/dress - Perfomed by helper: Thread/unthread left sleeve Assist Level: Touching or steadying assistance(Pt > 75%) Set up : To obtain clothing/put away Function - Lower Body Dressing/Undressing What is the patient wearing?: Underwear, Pants, Shoes, AFO, Ted Hose Position: Wheelchair/chair at [employer] - Performed by patient: Thread/unthread right underwear leg, Thread/unthread left underwear leg, Pull underwear up/down Underwear - Performed by helper: Pull underwear up/down Pants- Performed by patient: Thread/unthread right pants leg, Thread/unthread left pants leg, Pull pants up/down Pants- Performed by helper: Pull pants up/down Non-skid slipper socks- Performed by patient: Don/doff left sock, Don/doff right sock Non-skid slipper socks- Performed by helper: Don/doff right sock, Don/doff left sock Socks - Performed by patient: Don/doff right sock, Don/doff left sock Shoes - Performed by patient: Don/doff right shoe, Don/doff left shoe, Fasten right, Fasten left Shoes - Performed by helper: Fasten right, Fasten left AFO - Performed by helper: Don/doff left AFO TED Hose - Performed by patient: Don/doff right TED hose, Don/doff left TED hose TED Hose - Performed by helper: Don/doff right TED hose, Don/doff left TED hose Assist for footwear: Supervision/touching assist Assist  for lower body dressing: Touching or  steadying assistance (Pt > 75%)  Function - Toileting Toileting activity did not occur: N/A Toileting steps completed by patient: Adjust clothing prior to toileting, Performs perineal hygiene Toileting steps completed by helper: Adjust clothing after toileting Toileting Assistive Devices: Grab bar or rail Assist level: Touching or steadying assistance (Pt.75%)  Function - Air cabin crew transfer assistive device: Grab bar Assist level to toilet: Touching or steadying assistance (Pt > 75%) Assist level from toilet: Touching or steadying assistance (Pt > 75%) Assist level to bedside commode (at bedside): Touching or steadying assistance (Pt > 75%) Assist level from bedside commode (at bedside): Touching or steadying assistance (Pt > 75%)  Function - Chair/bed transfer Chair/bed transfer activity did not occur: N/A Chair/bed transfer method: Stand pivot Chair/bed transfer assist level: Moderate assist (Pt 50 - 74%/lift or lower) Chair/bed transfer assistive device: Walker Chair/bed transfer details: Verbal cues for technique, Verbal cues for precautions/safety, Verbal cues for safe use of DME/AE, Verbal cues for sequencing  Function - Locomotion: Wheelchair Will patient use wheelchair at discharge?: Yes Type: Manual Max wheelchair distance: 173f Assist Level: Touching or steadying assistance (Pt > 75%), Supervision or verbal cues Wheel 50 feet with 2 turns activity did not occur: Safety/medical concerns Assist Level: Touching or steadying assistance (Pt > 75%) Wheel 150 feet activity did not occur: Safety/medical concerns Assist Level: Supervision or verbal cues Turns around,maneuvers to table,bed, and toilet,negotiates 3% grade,maneuvers on rugs and over doorsills: No Function - Locomotion: Ambulation Assistive device: Walker-rolling, Orthosis Max distance: 40 Assist level: Moderate assist (Pt 50 - 74%) Assist level: Moderate assist (Pt 50 - 74%) Assist level: Moderate  assist (Pt 50 - 74%) Walk 10 feet on uneven surfaces activity did not occur: Safety/medical concerns  Function - Comprehension Comprehension: Auditory Comprehension assist level: Follows basic conversation/direction with no assist  Function - Expression Expression: Verbal Expression assist level: Expresses basic needs/ideas: With no assist  Function - Social Interaction Social Interaction assist level: Interacts appropriately 90% of the time - Needs monitoring or encouragement for participation or interaction.  Function - Problem Solving Problem solving assist level: Solves basic 90% of the time/requires cueing < 10% of the time  Function - Memory Memory assist level: Requires cues to use assistive device Patient normally able to recall (first 3 days only): Current season, Location of own room, Staff names and faces, That he or she is in a hospital  Medical Problem List and Plan: 1.  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  DVT Prophylaxis/Anticoagulation: Subcutaneous Lovenox. Monitor platelet counts in signs of bleeding 3. Pain Management/chronic back pain. Flexeril 5 mg twice a day, baclofen 10 mg twice a day,significant spasticity. We will reduce to 5 mg, no change in spasticity, we will discontinue Neurontin 300 mg 3 times a day and Percocet as needed.Chronic narcotics as outpt , added kpad for back    -No pain c/os on oxycodone similar to home dosing 8am, 6pm, 11pm   - tramadol for moderate pain and oxycodone for severe pain 4. Mood/PTSD. Zoloft 50 mg daily, Requip 0.25 mg 3 times a day 5. Neuropsych: This patient is capable of making decisions on her own behalf. 6. Skin/Wound Care: Routine skin checks, prob tinea pedis R foot started clotrimazole BID 7. Fluids/Electrolytes/Nutrition: Routine I&O's 8. Tobacco alcohol abuse. Counseling. No nicotine craving reported 9. History  of GI bleed. Continue Pepcid  twice a day.   -Hb 13.4 on 8/1 10.  Hx Alcohol abuse denies ETOH for ~30mo11. 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 We may need to discontinue oxybutynin if post void greater than 358m -. Repeat UA 12.  Constipation- improved  senna S 13. Sleep disturbance: prn trazodone  LOS (Days) 21 A FACE TO FACE EVALUATION WAS PERFORMED  Erez Mccallum E 10/22/2015, 9:47 AM

## 2015-10-22 NOTE — Progress Notes (Signed)
Social Work Patient ID: [name], female   DOB: 1955/11/15, 60 y.o.   MRN: [id number]  Met with pt to discuss discharge needs and niece to come in Monday prior to her discharge. She is agreeable to  The equipment recommended and follow up, she has spoken with her CAP worker and she is aware of her discharge date. She knows niece leaving today and coming back Sunday night.

## 2015-10-22 NOTE — Progress Notes (Signed)
Physical Therapy Session Note  Patient Details  Name: [name] MRN: [id number] Date of Birth: 1955/10/06  Today's Date: 10/22/2015 PT Individual Time: 8088-1103 PT Individual Time Calculation (min): 26 min    Short Term Goals: Week 2:  PT Short Term Goal 1 (Week 2): pt will perform w/c mobility 50' in controlled environment with min A PT Short Term Goal 2 (Week 2): Pt will gait 48' with mod A in controlled environment and LRAD PT Short Term Goal 3 (Week 2): Pt will consistently perform transfers with min A to L and R Week 3:  PT Short Term Goal 1 (Week 3): Pt will perform bed mobility and transfers with supervision PT Short Term Goal 2 (Week 3): Pt will perform w/c mobility x 150' with supervision in controlled and home environments PT Short Term Goal 3 (Week 3): Pt will perform gait x 25' with LRAD and min A in controlled environment PT Short Term Goal 4 (Week 3): Pt will negotiate 12 stairs with min A and R rail  Skilled Therapeutic Interventions/Progress Updates:    Patient received sitting in WC and agreeable to PT.  PT instructed patient in [facility] mobility for 164f with supervision Assist. Mod cues from PT for improved use of R UE to maintain straight trajectory in hall and for doorway management. Patient noted to have improved use of L UE folowing instruction from PT.   PT instructed patient in gait training for 739fx 2 with RW and min A. PT provided mod multimodal cues for increased WB through RLE in stance and improved L sided activation for increase hip/knee flexion with swing through patient noted to demonstrate foot drag on the LLE 50% of the time, but improved clearance with tactile cues to pelvis and trunk of rotation.   [facility] and all needs met.     Therapy Documentation Precautions:  Precautions Precautions: Fall Precaution Comments: left hemiparesis, pusher to the left Restrictions Weight Bearing  Restrictions: No   Pain: 0/10    See Function Navigator for Current Functional Status.   Therapy/Group: Individual Therapy  AuLorie Phenix/12/2015, 6:43 PM

## 2015-10-22 NOTE — Progress Notes (Signed)
Physical Therapy Session Note  Patient Details  Name: [name] MRN: [id number] Date of Birth: 1955/08/04  Today's Date: 10/22/2015 PT Individual Time: 0904-1013 PT Individual Time Calculation (min): 69 min    Short Term Goals: Week 3:  PT Short Term Goal 1 (Week 3): Pt will perform bed mobility and transfers with supervision PT Short Term Goal 2 (Week 3): Pt will perform w/c mobility x 150' with supervision in controlled and home environments PT Short Term Goal 3 (Week 3): Pt will perform gait x 25' with LRAD and min A in controlled environment PT Short Term Goal 4 (Week 3): Pt will negotiate 12 stairs with min A and R rail  Skilled Therapeutic Interventions/Progress Updates:    Pt received resting in bed, no c/o pain, and agreeable to therapy session.  Session focus on dynamic sitting balance, problem solving, NMR, standing balance, transfers, and gait training.    Pt transitioned supine>sit with bed rails and supervision for safety with more than a reasonable amount of time, mod cues for weight shifting to scoot hips to EOB.  Dynamic sitting balance for dressing sitting EOB.  PT provided supervision>steady assist throughout dressing.  PT threaded LEs and assisted with pulling up pants over hips as pt performed sit<>stand with steady assist.  Pt able to perform all upper body dressing tasks with set up assist and more than a reasonable amount of time.  PT donned TEDs and L shoe with AFO, pt donned R shoe and doffed non-skid socks.    Stand/pivot bed>w/c>therapy mat with RW and steady assist each trial with increased time and verbal cues for weight shifting and foot placement.  NMR in standing for forced use and weight shift for RLE toe taps on 2" step with mod/max assist for balance x10 reps.  Transition to NMR with RW and pt stepping forward/backward with RLE and mod assist to weight shift onto RLE.  Performed 2 trials of forward/backward stepping to fatigue.    Gait training 2x55' with  RW.  Mod assist on first trial for weight shift over RLE to advance LLE and min verbal cues for walker positioning.  PT provided pt education during seated rest break for pt to think about over exaggerating weight shift to the R in order to advance LLE more easily.  Min assist on second gait trial with multimodal cues for weight shift R and pt able to demonstrate much improved L step length and increase overall gait speed.    Pt returned to room in w/c at end of room total assist for energy conservation, positioned upright with call bell in reach and needs met.   Therapy Documentation Precautions:  Precautions Precautions: Fall Precaution Comments: left hemiparesis, pusher to the left Restrictions Weight Bearing Restrictions: No  See Function Navigator for Current Functional Status.   Therapy/Group: Individual Therapy  Katarina Riebe E Penven-Crew 10/22/2015, 10:25 AM

## 2015-10-23 ENCOUNTER — Inpatient Hospital Stay (HOSPITAL_COMMUNITY): Payer: Medicaid Other | Admitting: Occupational Therapy

## 2015-10-23 ENCOUNTER — Inpatient Hospital Stay (HOSPITAL_COMMUNITY): Payer: Medicaid Other | Admitting: *Deleted

## 2015-10-23 LAB — URINE CULTURE

## 2015-10-23 NOTE — Progress Notes (Signed)
Occupational Therapy Session Note  Patient Details  Name: [name] MRN: [id number] Date of Birth: 09/17/1955  Today's Date: 10/23/2015 OT Individual Time: 1300-1345 OT Individual Time Calculation (min): 45 min   Short Term Goals:Week 1:  OT Short Term Goal 1 (Week 1): Pt will complete LB bathing sit to stand with mod assist. OT Short Term Goal 1 - Progress (Week 1): Met OT Short Term Goal 2 (Week 1): Pt will complete LB dressing sit to stand with mod assist.  OT Short Term Goal 2 - Progress (Week 1): Met OT Short Term Goal 3 (Week 1): Pt will complete toilet transfers with min assist using LRAD. OT Short Term Goal 3 - Progress (Week 1): Not met OT Short Term Goal 4 (Week 1): Pt will perform shower transfers with min assist to walk-in shower. OT Short Term Goal 4 - Progress (Week 1): Not met OT Short Term Goal 5 (Week 1): Pt will use the LUE as a gross assist for bathing and dressing tasks with min assist.  OT Short Term Goal 5 - Progress (Week 1): Met  Skilled Therapeutic Interventions/Progress Updates:    Pt seen for skilled OT session focusing on neuro re-education. Pt sitting in w/c upon arrival and agreeable to tx session. Pt attempted to propel w/c to gym, but due to fatigue OT propelled pt. In gym pt worked on dynamic standing balance by reaching for clothes pins alternating using L hand for strengthening and R hand while weightbearing through L. Block was placed under pt LLE due to pt pushing towards left. Pt required min A throughout activity and VC to decrease L push. Pt required frequent rest breaks throughout standing activity. Due to fatigue pt transferred EOM needing VC and mod A due to decreased balance/fatigue. Pt sat EOM and performed same activity to increase dynamic sitting balance with CGA. Pt educated on the importance of using LUE during functional activities even if it is an stabilizer or is bearing weight. Pt reported understanding and left sitting in w/c with quick  release belt donned waiting on PT.   Therapy Documentation Precautions:  Precautions Precautions: Fall Precaution Comments: left hemiparesis, pusher to the left Restrictions Weight Bearing Restrictions: No Pain: Denies pain  See Function Navigator for Current Functional Status.   Therapy/Group: Individual Therapy  Matilde Bash 10/23/2015, 2:54 PM

## 2015-10-23 NOTE — Progress Notes (Signed)
Occupational Therapy Session Note  Patient Details  Name: [name] MRN: [id number] Date of Birth: 09-29-1955  Today's Date: 10/23/2015 OT Individual Time: 9242-6834 OT Individual Time Calculation (min): 53 min     Short Term Goals: Week 1:  OT Short Term Goal 1 (Week 1): Pt will complete LB bathing sit to stand with mod assist. OT Short Term Goal 1 - Progress (Week 1): Met OT Short Term Goal 2 (Week 1): Pt will complete LB dressing sit to stand with mod assist.  OT Short Term Goal 2 - Progress (Week 1): Met OT Short Term Goal 3 (Week 1): Pt will complete toilet transfers with min assist using LRAD. OT Short Term Goal 3 - Progress (Week 1): Not met OT Short Term Goal 4 (Week 1): Pt will perform shower transfers with min assist to walk-in shower. OT Short Term Goal 4 - Progress (Week 1): Not met OT Short Term Goal 5 (Week 1): Pt will use the LUE as a gross assist for bathing and dressing tasks with min assist.  OT Short Term Goal 5 - Progress (Week 1): Met Week 2:  OT Short Term Goal 1 (Week 2): Pt will complete toilet transfers with min assist using LRAD. OT Short Term Goal 1 - Progress (Week 2): Not met OT Short Term Goal 2 (Week 2): Pt will perform shower transfers with min assist to walk-in shower. OT Short Term Goal 2 - Progress (Week 2): Not met OT Short Term Goal 3 (Week 2): Pt will complete stand pivot toilet transfers with min assist to the 3:1.   OT Short Term Goal 3 - Progress (Week 2): Not met OT Short Term Goal 4 (Week 2): Pt will perform LB dressing with min assist sit to stand.  OT Short Term Goal 4 - Progress (Week 2): Met OT Short Term Goal 5 (Week 2): Pt will use the LUE as an active assist for pulling up pants and tying shoes with supervision.  OT Short Term Goal 5 - Progress (Week 2): Met Week 3:  OT Short Term Goal 1 (Week 3): Pt will perform shower transfers with min assist to walk-in shower. OT Short Term Goal 1 - Progress (Week 3): Met OT Short Term Goal 2  (Week 3): Pt will complete stand pivot toilet transfers with min assist to the 3:1.   OT Short Term Goal 2 - Progress (Week 3): Met OT Short Term Goal 3 (Week 3): Pt will complete LB dressing with supervision sit to stand. OT Short Term Goal 3 - Progress (Week 3): Progressing toward goal (continues to need steadying A) OT Short Term Goal 4 (Week 3): Pt will perform LB bathing with supervision sit to stand. OT Short Term Goal 4 - Progress (Week 3): Progressing toward goal (continues to need steadying A) Week 4:  OT Short Term Goal 1 (Week 4): STG = LTGs  Skilled Therapeutic Interventions/Progress Updates:     Pt seen for 1:1 treatment session targeting self-care and ADL reeducation. Pt performed bathing and dressing. Pt ambulated with rolling walker with min assistance for facilitation of weight shifting for advancing left LE. Pt completed bathing with min assistance for steadying assistance only for standing to wash buttocks and perineal area. Pt able to complete dressing from wheelchair level with min assistance for UE dressing and LE dressing. Pt required assistance for pulling sports bra down in back and fastening left and right shoes. Pt left seated in wheelchair with quick release belt applied in room. Pt  left with call button and phone within reach. Pt tolerated session well.  Therapy Documentation Precautions:  Precautions Precautions: Fall Precaution Comments: left hemiparesis, pusher to the left Restrictions Weight Bearing Restrictions: No   Pain: Patient did not report any pain this day.   ADL:  See Function Navigator for Current Functional Status.   Therapy/Group: Individual Therapy  [place] 10/23/2015, 1:07 PM

## 2015-10-23 NOTE — Discharge Instructions (Signed)
Inpatient Rehab Discharge Instructions  [name] Discharge date and time: No discharge date for patient encounter.   Activities/Precautions/ Functional Status: Activity: activity as tolerated Diet: regular diet Wound Care: none needed Functional status:  ___ No restrictions     ___ Walk up steps independently ___ 24/7 supervision/assistance   ___ Walk up steps with assistance ___ Intermittent supervision/assistance  ___ Bathe/dress independently ___ Walk with walker     _x__ Bathe/dress with assistance ___ Walk Independently    ___ Shower independently ___ Walk with assistance    ___ Shower with assistance ___ No alcohol     ___ Return to work/school ________  Special Instructions:  No driving    COMMUNITY REFERRALS UPON DISCHARGE:    Home Health:   PT, OT, RN   Agency:[facility] [phone]   Date of last service:10/26/2015  Medical Equipment/Items Freda MunroOrdered ROLLING WALKER  Agency/Supplier:[facility]   [phone]  PATIENT HAS A POWER CHAIR SHE RECEIVED IN 2015 NOT ELIGIBLE FOR A MANUEL WHEELCHAIR AND ALSO HAS A TUB SEAT. Other:[facility]  GENERAL COMMUNITY RESOURCES FOR PATIENT/FAMILY: Support Groups:[facility] SUPPORT GROUP EVERY SECOND Thursday @ 3:00-4:00 PM ON THE REHAB UNIT QUESTIONS CONTACT KATIE  [phone]  STROKE/TIA DISCHARGE INSTRUCTIONS SMOKING Cigarette smoking nearly doubles your risk of having a stroke & is the single most alterable risk factor  If you smoke or have smoked in the last 12 months, you are advised to quit smoking for your health.  Most of the excess cardiovascular risk related to smoking disappears within a year of stopping.  Ask you doctor about anti-smoking medications  [place] Quit Line: 1-800-QUIT NOW  Free Smoking Cessation Classes [phone]  CHOLESTEROL Know your levels; limit fat & cholesterol in your diet  Lipid Panel     Component Value Date/Time   CHOL 284 (H) 09/27/2015 0413   TRIG 144  09/27/2015 0413   HDL 40 (L) 09/27/2015 0413   CHOLHDL 7.1 09/27/2015 0413   VLDL 29 09/27/2015 0413   LDLCALC 215 (H) 09/27/2015 0413      Many patients benefit from treatment even if their cholesterol is at goal.  Goal: Total Cholesterol (CHOL) less than 160  Goal:  Triglycerides (TRIG) less than 150  Goal:  HDL greater than 40  Goal:  LDL (LDLCALC) less than 100   BLOOD PRESSURE American Stroke Association blood pressure target is less that 120/80 mm/Hg  Your discharge blood pressure is:  BP: 113/70  Monitor your blood pressure  Limit your salt and alcohol intake  Many individuals will require more than one medication for high blood pressure  DIABETES (A1c is a blood sugar average for last 3 months) Goal HGBA1c is under 7% (HBGA1c is blood sugar average for last 3 months)  Diabetes: No known diagnosis of diabetes    Lab Results  Component Value Date   HGBA1C 5.3 09/27/2015     Your HGBA1c can be lowered with medications, healthy diet, and exercise.  Check your blood sugar as directed by your physician  Call your physician if you experience unexplained or low blood sugars.  PHYSICAL ACTIVITY/REHABILITATION Goal is 30 minutes at least 4 days per week  Activity: Increase activity slowly, Therapies: Physical Therapy: Home Health Return to work:   Activity decreases your risk of heart attack and stroke and makes your heart stronger.  It helps control your weight and blood pressure; helps you relax and can improve your mood.  Participate in a regular exercise program.  Talk with  your doctor about the best form of exercise for you (dancing, walking, swimming, cycling).  DIET/WEIGHT Goal is to maintain a healthy weight  Your discharge diet is: Diet Heart Room service appropriate?: Yes; Fluid consistency:: Thin  liquids Your height is:    Your current weight is: Weight: 65 kg (143 lb 6.4 oz) Your Body Mass Index (BMI) is:     Following the type of diet specifically  designed for you will help prevent another stroke.  Your goal weight range is:    Your goal Body Mass Index (BMI) is 19-24.  Healthy food habits can help reduce 3 risk factors for stroke:  High cholesterol, hypertension, and excess weight.  RESOURCES Stroke/Support Group:  Call [phone]   STROKE EDUCATION PROVIDED/REVIEWED AND GIVEN TO PATIENT Stroke warning signs and symptoms How to activate emergency medical system (call 911). Medications prescribed at discharge. Need for follow-up after discharge. Personal risk factors for stroke. Pneumonia vaccine given:  Flu vaccine given:  My questions have been answered, the writing is legible, and I understand these instructions.  I will adhere to these goals & educational materials that have been provided to me after my discharge from the hospital.     My questions have been answered and I understand these instructions. I will adhere to these goals and the provided educational materials after my discharge from the hospital.  Patient/Caregiver Signature _______________________________ Date __________  Clinician Signature _______________________________________ Date __________  Please bring this form and your medication list with you to all your follow-up doctor's appointments.

## 2015-10-23 NOTE — Progress Notes (Signed)
Occupational Therapy Weekly Progress Note  Patient Details  Name: [name] MRN: [id number] Date of Birth: [birth year]  Beginning of progress report period: October 15, 2015 End of progress report period: October 23, 2015  Patient has met 2 of 4 short term goals.  Pt is progressing extremely well and had made progress with her motor control to be able to stand and ambulate with RW with min A. She needs facilitation through her pelvis to wt shift to R to advance LLE.  Cues needed for LE placement to prepare for successful sit to stand. Pt able to stand with slight steadying A as she cleanses self and pulls up pants. Pt is able to learn new techniques quickly and is highly motivated to be independent.  Patient continues to demonstrate the following deficits: decreased LLE strength, decreased standing balance with occasional L lean, decreased protective responses and therefore will continue to benefit from skilled OT intervention to enhance overall performance with BADL.  Patient progressing toward long term goals.  Continue plan of care.  OT Short Term Goals Week 1:  OT Short Term Goal 1 (Week 1): Pt will complete LB bathing sit to stand with mod assist. OT Short Term Goal 1 - Progress (Week 1): Met OT Short Term Goal 2 (Week 1): Pt will complete LB dressing sit to stand with mod assist.  OT Short Term Goal 2 - Progress (Week 1): Met OT Short Term Goal 3 (Week 1): Pt will complete toilet transfers with min assist using LRAD. OT Short Term Goal 3 - Progress (Week 1): Not met OT Short Term Goal 4 (Week 1): Pt will perform shower transfers with min assist to walk-in shower. OT Short Term Goal 4 - Progress (Week 1): Not met OT Short Term Goal 5 (Week 1): Pt will use the LUE as a gross assist for bathing and dressing tasks with min assist.  OT Short Term Goal 5 - Progress (Week 1): Met Week 2:  OT Short Term Goal 1 (Week 2): Pt will complete toilet transfers with min assist using LRAD. OT Short  Term Goal 1 - Progress (Week 2): Not met OT Short Term Goal 2 (Week 2): Pt will perform shower transfers with min assist to walk-in shower. OT Short Term Goal 2 - Progress (Week 2): Not met OT Short Term Goal 3 (Week 2): Pt will complete stand pivot toilet transfers with min assist to the 3:1.   OT Short Term Goal 3 - Progress (Week 2): Not met OT Short Term Goal 4 (Week 2): Pt will perform LB dressing with min assist sit to stand.  OT Short Term Goal 4 - Progress (Week 2): Met OT Short Term Goal 5 (Week 2): Pt will use the LUE as an active assist for pulling up pants and tying shoes with supervision.  OT Short Term Goal 5 - Progress (Week 2): Met Week 3:  OT Short Term Goal 1 (Week 3): Pt will perform shower transfers with min assist to walk-in shower. OT Short Term Goal 1 - Progress (Week 3): Met OT Short Term Goal 2 (Week 3): Pt will complete stand pivot toilet transfers with min assist to the 3:1.   OT Short Term Goal 2 - Progress (Week 3): Met OT Short Term Goal 3 (Week 3): Pt will complete LB dressing with supervision sit to stand. OT Short Term Goal 3 - Progress (Week 3): Progressing toward goal (continues to need steadying A) OT Short Term Goal 4 (Week 3): Pt will  perform LB bathing with supervision sit to stand. 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    [place] 10/23/2015, 12:57 PM

## 2015-10-23 NOTE — Progress Notes (Signed)
Social Work  Discharge Note  The overall goal for the admission was met for:   Discharge location: [place]  Length of Stay: Yes-25 DAYS  Discharge activity level: Yes-MIN ASSIST LEVEL  Home/community participation: Yes  Services provided included: MD, RD, PT, OT, SLP, RN, CM, TR, Pharmacy, Neuropsych and SW  Financial Services: Medicaid  Follow-up services arranged: Home Health: [place] [facility]-PT,OT,RN, DME: [facility]-ROLLING WALKER and Patient/Family has no preference for HH/DME agencies  Comments (or additional information):PT RECEIVED A POWER CHAIR IN 2015 AND A TUB SEAT IN 2016 SHE IS NOT ELIGIBLE FOR A MANUEL CHAIR OR TUB BENCH, MADE PT AND NIECE AWAREOF THIS. NIECE IN FOR FAMILY EDUCATION DAY OF DISCHARGE, AWARE PT WILL REQUIRE 24 HR CARE AT HOME. [facility]-8 HRS PER DAY.  Patient/Family verbalized understanding of follow-up arrangements: Yes  Individual responsible for coordination of the follow-up plan: [name]-NIECE  Confirmed correct DME delivered: Elease Hashimoto 10/23/2015    Elease Hashimoto

## 2015-10-23 NOTE — Progress Notes (Signed)
Social Work Patient ID: Alexa FrancoisAlice Boen, female   DOB: 11/27/1955, 60 y.o.   MRN: [id number]  [facility] pt has a tub seat and a power chair she got in 2015, therefore pt is not eligible for a manuel wheelchair or Tub bench.  Have informed pt and niece-[name] per phone about this. Still planning family education for Monday with [name]. Work toward discharge Monday.

## 2015-10-23 NOTE — Progress Notes (Signed)
Subjective/Complaints: Freq voiding just at noc, no dysuria No spasticity complaints  ROS: Denies N/V/D, CP, SOB.  Objective: Vital Signs: Blood pressure 103/75, pulse 72, temperature 98.3 F (36.8 C), temperature source Oral, resp. rate 16, weight 65 kg (143 lb 6.4 oz), SpO2 98 %. No results found. Results for orders placed or performed during the hospital encounter of 10/01/15 (from the past 72 hour(s))  Creatinine, serum     Status: None   Collection Time: 10/22/15  5:00 AM  Result Value Ref Range   Creatinine, Ser 0.81 0.44 - 1.00 mg/dL   GFR calc non Af Amer >60 >60 mL/min   GFR calc Af Amer >60 >60 mL/min    Comment: (NOTE) The eGFR has been calculated using the CKD EPI equation. This calculation has not been validated in all clinical situations. eGFR's persistently <60 mL/min signify possible Chronic Kidney Disease.   Urinalysis, Routine w reflex microscopic (not at [facility])     Status: Abnormal   Collection Time: 10/22/15  2:08 PM  Result Value Ref Range   Color, Urine YELLOW YELLOW   APPearance CLEAR CLEAR   Specific Gravity, Urine 1.018 1.005 - 1.030   pH 6.5 5.0 - 8.0   Glucose, UA NEGATIVE NEGATIVE mg/dL   Hgb urine dipstick NEGATIVE NEGATIVE   Bilirubin Urine NEGATIVE NEGATIVE   Ketones, ur NEGATIVE NEGATIVE mg/dL   Protein, ur NEGATIVE NEGATIVE mg/dL   Nitrite NEGATIVE NEGATIVE   Leukocytes, UA SMALL (A) NEGATIVE  Urine microscopic-add on     Status: Abnormal   Collection Time: 10/22/15  2:08 PM  Result Value Ref Range   Squamous Epithelial / LPF 0-5 (A) NONE SEEN   WBC, UA 0-5 0 - 5 WBC/hpf   RBC / HPF NONE SEEN 0 - 5 RBC/hpf   Bacteria, UA RARE (A) NONE SEEN    Gen: NAD. Vital signs reviewed.  HEENT: Normocephalic, atraumatic Cardio: RRR and no murmur Resp: CTA B/L and unlabored GI: BS positive and NT,ND Musc/Skel:  No edema.  No tenderness. Neuro: Alert. Motor 5/5 in RUE and RLE LUE: 4-/5 L delt, bi, tri, grip  LLE: 4-/5 HF KE, 3- ADF/PF  Skin:   Warm and dry. Psych: Flat affect (slightly improved)  Assessment/Plan: 1. Functional deficits secondary to Left hemiparesis which require 3+ hours per day of interdisciplinary therapy in a comprehensive inpatient rehab setting. Physiatrist is providing close team supervision and 24 hour management of active medical problems listed below. Physiatrist and rehab team continue to assess barriers to discharge/monitor patient progress toward functional and medical goals. FIM: Function - Bathing Position: Shower Body parts bathed by patient: Right arm, Left arm, Chest, Abdomen, Front perineal area, Buttocks, Right upper leg, Left upper leg, Right lower leg, Left lower leg Body parts bathed by helper: Back Bathing not applicable: Back Assist Level: Touching or steadying assistance(Pt > 75%)  Function- Upper Body Dressing/Undressing What is the patient wearing?: Bra, Pull over shirt/dress Bra - Perfomed by patient: Thread/unthread right bra strap, Thread/unthread left bra strap (sports bra, no hooks) Bra - Perfomed by helper: Hook/unhook bra (pull down sports bra) Pull over shirt/dress - Perfomed by patient: Thread/unthread right sleeve, Thread/unthread left sleeve, Put head through opening, Pull shirt over trunk Pull over shirt/dress - Perfomed by helper: Thread/unthread left sleeve Assist Level: Set up Set up : To obtain clothing/put away Function - Lower Body Dressing/Undressing What is the patient wearing?: Pants, Ted Hose, AFO, Shoes Position: Wheelchair/chair at [employer] - Performed by patient: Thread/unthread right  underwear leg, Thread/unthread left underwear leg, Pull underwear up/down Underwear - Performed by helper: Pull underwear up/down Pants- Performed by patient: Thread/unthread right pants leg, Thread/unthread left pants leg, Pull pants up/down Pants- Performed by helper: Thread/unthread right pants leg, Thread/unthread left pants leg, Pull pants up/down Non-skid slipper  socks- Performed by patient: Don/doff left sock, Don/doff right sock Non-skid slipper socks- Performed by helper: Don/doff right sock, Don/doff left sock Socks - Performed by patient: Don/doff right sock, Don/doff left sock Shoes - Performed by patient: Don/doff right shoe Shoes - Performed by helper: Fasten right, Fasten left, Don/doff left shoe AFO - Performed by helper: Don/doff left AFO TED Hose - Performed by patient: Don/doff right TED hose, Don/doff left TED hose TED Hose - Performed by helper: Don/doff right TED hose, Don/doff left TED hose Assist for footwear: Supervision/touching assist Assist for lower body dressing: Touching or steadying assistance (Pt > 75%)  Function - Toileting Toileting activity did not occur: N/A Toileting steps completed by patient: Adjust clothing prior to toileting, Performs perineal hygiene Toileting steps completed by helper: Adjust clothing after toileting Toileting Assistive Devices: Grab bar or rail Assist level: Touching or steadying assistance (Pt.75%)  Function - Air cabin crew transfer assistive device: Grab bar Assist level to toilet: Touching or steadying assistance (Pt > 75%) Assist level from toilet: Touching or steadying assistance (Pt > 75%) Assist level to bedside commode (at bedside): Touching or steadying assistance (Pt > 75%) Assist level from bedside commode (at bedside): Touching or steadying assistance (Pt > 75%)  Function - Chair/bed transfer Chair/bed transfer activity did not occur: N/A Chair/bed transfer method: Stand pivot Chair/bed transfer assist level: Touching or steadying assistance (Pt > 75%) Chair/bed transfer assistive device: Armrests, Walker Chair/bed transfer details: Verbal cues for technique, Verbal cues for precautions/safety, Verbal cues for safe use of DME/AE, Verbal cues for sequencing  Function - Locomotion: Wheelchair Will patient use wheelchair at discharge?: Yes Type: Manual Max wheelchair  distance: 130f Assist Level: Supervision or verbal cues Wheel 50 feet with 2 turns activity did not occur: Safety/medical concerns Assist Level: Supervision or verbal cues Wheel 150 feet activity did not occur: Safety/medical concerns Assist Level: Supervision or verbal cues Turns around,maneuvers to table,bed, and toilet,negotiates 3% grade,maneuvers on rugs and over doorsills: No Function - Locomotion: Ambulation Assistive device: Walker-rolling Max distance: 710fAssist level: Touching or steadying assistance (Pt > 75%) Assist level: Touching or steadying assistance (Pt > 75%) Assist level: Touching or steadying assistance (Pt > 75%) Assist level: Moderate assist (Pt 50 - 74%) Walk 10 feet on uneven surfaces activity did not occur: Safety/medical concerns  Function - Comprehension Comprehension: Auditory Comprehension assist level: Follows basic conversation/direction with no assist  Function - Expression Expression: Verbal Expression assist level: Expresses basic needs/ideas: With no assist  Function - Social Interaction Social Interaction assist level: Interacts appropriately 90% of the time - Needs monitoring or encouragement for participation or interaction.  Function - Problem Solving Problem solving assist level: Solves basic 90% of the time/requires cueing < 10% of the time  Function - Memory Memory assist level: Requires cues to use assistive device Patient normally able to recall (first 3 days only): Current season, Location of own room, Staff names and faces, That he or she is in a hospital  Medical Problem List and Plan: 1.  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  DVT Prophylaxis/Anticoagulation: Subcutaneous  Lovenox. Monitor platelet counts in signs of bleeding 3. Pain Management/chronic back pain. Flexeril 5 mg twice a day, baclofen 59m BID with no  increase in spasticity will d/c  Cont Neurontin 300 mg 3 times a day and Percocet as needed.Chronic narcotics as outpt , added kpad for back    -No pain c/os on oxycodone similar to home dosing 8am, 6pm, 11pm   - tramadol for moderate pain and oxycodone for severe pain 4. Mood/PTSD. Zoloft 50 mg daily, Requip 0.25 mg 3 times a day 5. Neuropsych: This patient is capable of making decisions on her own behalf. 6. Skin/Wound Care: Routine skin checks, prob tinea pedis R foot started clotrimazole BID 7. Fluids/Electrolytes/Nutrition: Routine I&O's 8. Tobacco alcohol abuse. Counseling. No nicotine craving reported 9. History of GI bleed. Continue Pepcid twice a day.   -Hb 13.4 on 8/1 10.  Hx Alcohol abuse denies ETOH for ~954mo1. 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 Repeat UA neg 12.  Constipation- improved  senna S 13. Sleep disturbance: prn trazodone  LOS (Days) 22 A FACE TO FACE EVALUATION WAS PERFORMED  [name] 10/23/2015, 8:49 AM

## 2015-10-23 NOTE — Progress Notes (Signed)
Physical Therapy Weekly Progress Note  Patient Details  Name: [name] MRN: [id number] Date of Birth: Feb 27, 1956  Beginning of progress report period: October 16, 2015 End of progress report period: October 23, 2015  Today's Date: 10/23/2015 PT Individual Time: 0900-1000  And 13:45-14:30  PT Individual Time Calculation (min): 60 min and  Patient has met 0 of 4 short term goals.  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 Pt is unable to correct balance without multi-modal cues in a timely manner. Pt requires increased time for WC propuision and distance is limited by fatigue. Pt seems to have limited ability to carry-over learning new information related to functional safety.   Patient continues to demonstrate the following deficits: impaired timing and sequencing, unbalanced muscle activation, delayed righting reactions, hemi-paresis, L-sided inattention, low vision and decreased activity tolerance and therefore will continue to benefit from skilled PT intervention to enhance overall performance with activity tolerance, balance, postural control, ability to compensate for deficits, functional use of  left upper extremity and left lower extremity, attention, awareness and coordination.  Patient not progressing toward long term goals.  See goal revision.   PT Short Term Goals Week 3:  PT Short Term Goal 1 (Week 3): Pt will perform bed mobility and transfers with supervision PT Short Term Goal 1 - Progress (Week 3): Progressing toward goal PT Short Term Goal 2 (Week 3): Pt will perform w/c mobility x 150' with supervision in controlled and home environments PT Short Term Goal 2 - Progress (Week 3): Progressing toward goal PT Short Term Goal 3 (Week 3): Pt will perform gait x 25' with LRAD and min A in controlled environment PT Short Term Goal  3 - Progress (Week 3): Progressing toward goal PT Short Term Goal 4 (Week 3): Pt will negotiate 12 stairs with min A and R rail PT Short Term Goal 4 - Progress (Week 3): Progressing toward goal Week 4:  PT Short Term Goal 1 (Week 4): STG=LTG due to LOS  Skilled Therapeutic Interventions/Progress Updates:    Tx focused on functional mobility training, gait with RW, and NMR via forced use, manual facilitation, and multi-modal cues for L-sided control, midline orientation in standing, and safety. Pt up in bed, reporting not feeling well today. Adjusted new RW for optimal height.   Stand-pivot transfers with RW from bed and toilet with Min A overall and cues for R weight shifting.   Gait in/around room with up to Mod A for R weight-shift and LLE cues for all aspects of gait cycle. Pt's gait marked by decreased foot clearance as well as decreased hip/knee extension in stance. Pt was able to make adjustments with manual facilitation, but not sustain independently.   WC propulsion with bil UEs x100' with S and cues for efficient stroke length, distance limited by fatigue.  Stair training x8 with R rail ascending forwards, descending sideways with step-by-step cues for sequence and body position.  Second tx focused on gait and mobility in apartment setting. Pt began crying during tx due to worry about DC and concerned that her "neice won't be able to handle me." Emotional support provided.   Pt received in gym and propelled WC to apartment in >6 min with distant S. Pt propelled WC and gait with RW on carpet with S and Mod A respectively. Pt unable to  recall need for increased step width and shift to RLE during L swing.  Bed mobility performed with S.  Toilet transfer with Min A using grab bar. Pt able to adjust clothing independently.  Pt left up in [facility] with all needs in reach.     Therapy Documentation Precautions:  Precautions Precautions: Fall Precaution Comments: left hemiparesis, pusher to  the left Restrictions Weight Bearing Restrictions: No   See Function Navigator for Current Functional Status.  Therapy/Group: Individual Therapy  Alane Hanssen, Corinna Lines, PT, DPT  10/23/2015, 9:29 AM

## 2015-10-24 ENCOUNTER — Inpatient Hospital Stay (HOSPITAL_COMMUNITY): Payer: Medicaid Other | Admitting: *Deleted

## 2015-10-24 NOTE — Progress Notes (Signed)
Physical Therapy Session Note  Patient Details  Name: [name] MRN: [id number] Date of Birth: 05-06-1955  Today's Date: 10/24/2015 PT Individual Time: 1020-1105 PT Individual Time Calculation (min): 45 min    Short Term Goals:Week 4:  PT Short Term Goal 1 (Week 4): STG+LTG due to LOS, S transfers and WC, Min A gait x 50'  Skilled Therapeutic Interventions/Progress Updates:    Pt feeling much better today with less fatigue and improved performance of all mobility. [facility] to do [facility] and gait. Tx also focused on curb step training with RW as well as transitional movements during gait for turning and baking up.   Stair training x8 with R rail ascending forwads, descending sideways with min A and Mod cues for foot placement and R weight shift.   Demonstration and instruction on curb step for home entry with RW. Pt return demonstrated with Mod A for RW management and balance, needing cues for sequence.   Gait x75' with RW and L AFO, manual facilitation for R weight shift and L hip/knee ext in stance. Pt better able to self-correct errors with increased time today.  Pt challenged in multiple approaches to chairs, including turn and backing up. Pt needed up to Mod A in these dynamic instances.  Seated alternating marching and LAQ for strengthening and motor control.   Pt continues to have difficulty with achieving full R weight-shift and full L hip/knee ext in stance, but does well with manual facilitation.    Therapy Documentation Precautions:  Precautions Precautions: Fall Precaution Comments: left hemiparesis, pusher to the left Restrictions Weight Bearing Restrictions: No   Pain: none   See Function Navigator for Current Functional Status.   Therapy/Group: Individual Therapy  Tylesha Gibeault Virl CageyM  Desman Polak, PT, DPT  10/24/2015, 10:55 AM

## 2015-10-24 NOTE — Progress Notes (Signed)
10/24/15.  Subjective/Complaints:  60 y/o admit for CIR with Functional deficits secondary to Left hemiparesis  ROS: Denies N/V/D, CP, SOB.  Concerned about pain and sleep meds  After discharge.  Anticipating d/c in 2 days.   Past Medical History:  Diagnosis Date  . Alcohol abuse   . Chronic back pain   . Colitis   . CVA (cerebral infarction)   . Fatty liver   . Gastric ulcer   . GI bleed   . Hepatitis    Hepatitis C  . MDD (major depressive disorder) ([place])   . Pancreatitis   . PTSD (post-traumatic stress disorder)   . Stroke ([place])   . UTI (lower urinary tract infection)     Objective: Vital Signs: Blood pressure 104/62, pulse 72, temperature 98 F (36.7 C), temperature source Oral, resp. rate 18, weight 143 lb 6.4 oz (65 kg), SpO2 96 %. No results found. Results for orders placed or performed during the hospital encounter of 10/01/15 (from the past 72 hour(s))  Creatinine, serum     Status: None   Collection Time: 10/22/15  5:00 AM  Result Value Ref Range   Creatinine, Ser 0.81 0.44 - 1.00 mg/dL   GFR calc non Af Amer >60 >60 mL/min   GFR calc Af Amer >60 >60 mL/min    Comment: (NOTE) The eGFR has been calculated using the CKD EPI equation. This calculation has not been validated in all clinical situations. eGFR's persistently <60 mL/min signify possible Chronic Kidney Disease.   Urinalysis, Routine w reflex microscopic (not at [facility])     Status: Abnormal   Collection Time: 10/22/15  2:08 PM  Result Value Ref Range   Color, Urine YELLOW YELLOW   APPearance CLEAR CLEAR   Specific Gravity, Urine 1.018 1.005 - 1.030   pH 6.5 5.0 - 8.0   Glucose, UA NEGATIVE NEGATIVE mg/dL   Hgb urine dipstick NEGATIVE NEGATIVE   Bilirubin Urine NEGATIVE NEGATIVE   Ketones, ur NEGATIVE NEGATIVE mg/dL   Protein, ur NEGATIVE NEGATIVE mg/dL   Nitrite NEGATIVE NEGATIVE   Leukocytes, UA SMALL (A) NEGATIVE  Urine culture     Status: Abnormal   Collection Time: 10/22/15  2:08 PM   Result Value Ref Range   Specimen Description URINE, RANDOM    Special Requests NONE    Culture MULTIPLE SPECIES PRESENT, SUGGEST RECOLLECTION (A)    Report Status 10/23/2015 FINAL   Urine microscopic-add on     Status: Abnormal   Collection Time: 10/22/15  2:08 PM  Result Value Ref Range   Squamous Epithelial / LPF 0-5 (A) NONE SEEN   WBC, UA 0-5 0 - 5 WBC/hpf   RBC / HPF NONE SEEN 0 - 5 RBC/hpf   Bacteria, UA RARE (A) NONE SEEN    Gen: NAD. Vital signs reviewed.  HEENT: Normocephalic, atraumatic Cardio: RRR and no murmur Resp: CTA B/L and unlabored GI: BS positive and NT,ND Musc/Skel:  No edema.  No tenderness. Neuro: Alert. Motor  LUE: 4-/5 L delt, bi, tri, grip  LLE: 4-/5 HF KE, 3- ADF/PF  Skin:  Warm and dry. Psych: Flat affect    Medical Problem List and Plan: 1.  Left side weakness secondary to nonhemorrhagic infarct posterior right corona radiata  -cont CIR,PT, OT SLP  2.  DVT Prophylaxis/Anticoagulation: Subcutaneous Lovenox. Monitor platelet counts in signs of bleeding 3. Pain Management/chronic back pain. -No pain c/os on oxycodone similar to home dosing 8am, 6pm, 11pm   - tramadol for moderate pain and  oxycodone for severe pain 4. Mood/PTSD. Zoloft 50 mg daily, Requip 0.25 mg 3 times a day  5. History of GI bleed. Continue Pepcid twice a day.   -Hb 13.4 on 8/1   6. Sleep disturbance: prn trazodone  LOS (Days) 23 A FACE TO FACE EVALUATION WAS PERFORMED  Nyoka Cowden 10/24/2015, 9:37 AM  Patient ID: [name], female   DOB: Aug 02, 1955, 60 y.o.   MRN: [id number]   10/24/15.

## 2015-10-25 ENCOUNTER — Inpatient Hospital Stay (HOSPITAL_COMMUNITY): Payer: Medicaid Other

## 2015-10-25 NOTE — Progress Notes (Signed)
Patient ID: Alexa Francoislice [name], female   DOB: 1955/06/22, 60 y.o.   MRN: [id number]   10/25/15.  Subjective/Complaints:  60 y/o admit for CIR with Functional deficits secondary to Left hemiparesis  ROS: Denies N/V/D, CP, SOB.  Concerned about pain and sleep meds  After discharge.  Anticipating d/c tomorrow. No new concerns or complaints   Past Medical History:  Diagnosis Date  . Alcohol abuse   . Chronic back pain   . Colitis   . CVA (cerebral infarction)   . Fatty liver   . Gastric ulcer   . GI bleed   . Hepatitis    Hepatitis C  . MDD (major depressive disorder) ([facility])   . Pancreatitis   . PTSD (post-traumatic stress disorder)   . Stroke ([facility])   . UTI (lower urinary tract infection)     Objective: Vital Signs: Blood pressure (!) 90/53, pulse 72, temperature 98.3 F (36.8 C), temperature source Oral, resp. rate 18, weight 141 lb 11.2 oz (64.3 kg), SpO2 100 %. No results found. Results for orders placed or performed during the hospital encounter of 10/01/15 (from the past 72 hour(s))  Urinalysis, Routine w reflex microscopic ([facility])     Status: Abnormal   Collection Time: 10/22/15  2:08 PM  Result Value Ref Range   Color, Urine YELLOW YELLOW   APPearance CLEAR CLEAR   Specific Gravity, Urine 1.018 1.005 - 1.030   pH 6.5 5.0 - 8.0   Glucose, UA NEGATIVE NEGATIVE mg/dL   Hgb urine dipstick NEGATIVE NEGATIVE   Bilirubin Urine NEGATIVE NEGATIVE   Ketones, ur NEGATIVE NEGATIVE mg/dL   Protein, ur NEGATIVE NEGATIVE mg/dL   Nitrite NEGATIVE NEGATIVE   Leukocytes, UA SMALL (A) NEGATIVE  Urine culture     Status: Abnormal   Collection Time: 10/22/15  2:08 PM  Result Value Ref Range   Specimen Description URINE, RANDOM    Special Requests NONE    Culture MULTIPLE SPECIES PRESENT, SUGGEST RECOLLECTION (A)    Report Status 10/23/2015 FINAL   Urine microscopic-add on     Status: Abnormal   Collection Time: 10/22/15  2:08 PM  Result Value Ref Range   Squamous Epithelial /  LPF 0-5 (A) NONE SEEN   WBC, UA 0-5 0 - 5 WBC/hpf   RBC / HPF NONE SEEN 0 - 5 RBC/hpf   Bacteria, UA RARE (A) NONE SEEN    Gen: NAD. Vital signs reviewed.  HEENT: Normocephalic, atraumatic Cardio: RRR and no murmur Resp: CTA B/L and unlabored GI: BS positive and NT,ND Musc/Skel:  No edema.  No tenderness. Neuro: Alert. Motor  LUE: 4-/5 L delt, bi, tri, grip  LLE: 4-/5 HF KE, 3- ADF/PF  Skin:  Warm and dry. Psych: Flat affect    Medical Problem List and Plan: 1.  Left side weakness secondary to nonhemorrhagic infarct posterior right corona radiata  -cont CIR,PT, OT SLP  2.  DVT Prophylaxis/Anticoagulation: Subcutaneous Lovenox. Monitor platelet counts in signs of bleeding 3. Pain Management/chronic back pain. -No pain c/os on oxycodone similar to home dosing 8am, 6pm, 11pm   - tramadol for moderate pain and oxycodone for severe pain 4. Mood/PTSD. Zoloft 50 mg daily, Requip 0.25 mg 3 times a day  5. History of GI bleed. Continue Pepcid twice a day.   -Hb 13.4 on 8/1   6. Sleep disturbance: prn trazodone  LOS (Days) 24 A FACE TO FACE EVALUATION WAS PERFORMED  [name],[name] FRANK 10/25/2015, 8:33 AM  Patient ID: Alexa FrancoisAlice Coffel, female  DOB: 1955/09/29, 60 y.o.   MRN: [id number]

## 2015-10-25 NOTE — Discharge Summary (Signed)
NAMEZUMA, HUST NO.:  [id number]  MEDICAL RECORD NO.:  [id number]  LOCATION:  4W08C                        FACILITY:  [facility]  PHYSICIAN:  Erick Colace, M.D.DATE OF BIRTH:  1956/01/01  DATE OF ADMISSION:  09/29/2015 DATE OF DISCHARGE:  10/26/2015                              DISCHARGE SUMMARY   DISCHARGE DIAGNOSES: 1. Nonhemorrhagic infarct, posterior right corona radiata. 2. Subcutaneous Lovenox for DVT prophylaxis. 3. Chronic back pain. 4. Depression. 5. Tobacco alcohol abuse. 6. History of GI bleed. 7. Constipation, resolved.  HISTORY OF PRESENT ILLNESS:  This is a 60 year old right-handed female with history of chronic back pain, tobacco and alcohol abuse, PTSD, and gastrointestinal bleed.  Per chart review, patient lives with nieces. Two-level home, bed and bath upstairs.  Reported used a single-point cane prior to admission.  Presented September 26, 2015, with acute onset of left-sided weakness.  Cranial CT scan negative.  MRI showed a 16 mm acute ischemic nonhemorrhagic infarct involving the posterior right corona radiata.  Multiple remote lacunar infarcts involving the bilateral external capsules basal ganglia.  MRA of the head negative. The patient did not receive tPA.  Echocardiogram with ejection fraction of 55% and grade 2 diastolic dysfunction.  Carotid Dopplers with no ICA stenosis.  Neurology consulted, maintained on Plavix for CVA prophylaxis.  Subcutaneous Lovenox for DVT prophylaxis.  Tolerating a regular consistency diet.  The patient was admitted for a comprehensive rehab program.  PAST MEDICAL HISTORY:  See discharge diagnoses.  SOCIAL HISTORY:  She lives with nieces, independent with a single-point cane prior to admission.  Functional status upon admission to rehab services was mod to max assist, 15 feet rolling walker; moderate assist, sit to stand; min to mod assist for activities of daily living.  PHYSICAL EXAMINATION:   VITAL SIGNS:  Blood pressure 146/70, pulse 57, temperature 97, respirations 18. GENERAL:  This was an alert female, oriented x3. LUNGS:  Clear to auscultation without wheeze. CARDIAC:  Regular rate and rhythm.  No murmur. ABDOMEN:  Soft, nontender.  Good bowel sounds. CNS:  Speech was a bit hoarse but intelligible, followed simple commands, fair awareness of deficits.  REHABILITATION AND HOSPITAL COURSE:  The patient was admitted to inpatient rehab services with therapies initiated on a 3-hour daily basis, consisting of physical therapy, occupational therapy, speech therapy, and rehabilitation nursing.  The following issues were addressed during the patient's rehabilitation stay.  Pertaining to Ms. Roudebush's nonhemorrhagic infarct, posterior right corona radiata, she would follow up with Neurology Service in 1-2 months, AFO brace as advised, continue Plavix for CVA prophylaxis.  Subcutaneous Lovenox for DVT prophylaxis, no bleeding episodes.  Pain management for chronic back pain, maintained on Flexeril, Neurontin 300 mg 3 times daily, a Lidoderm patch as well as chronic narcotics as an outpatient.  She continued on Zoloft as well as Requip for history of depression and PTSD.  Blood pressures remained well controlled.  She did have a history of GI bleed, hemoglobin stable, she remained on Pepcid.  Noted history of alcohol and tobacco abuse, she received full counsel in regard to cessation of nicotine and alcohol products, it was questionable if she would be compliant with these  requests.  The patient received weekly collaborative interdisciplinary team conferences to discuss estimated length of stay, family teaching, any barriers to her discharge. Sessions focused on functional transfers, gait training, rolling walker with a left AFO brace.  Educated on postural control, weight shifts, stair negotiation.  The patient requires overall minimal assist for balance and transfers with  moderate verbal cues.  During ambulation, required manual facilitation for weight shifting onto the right for increased clearance of left lower extremity.  Activities of daily living and homemaking.  The patient could propel her wheelchair, but easily fatigued.  In the gym, worked on dynamic standing balance for reaching for clothing.  She needed some help with hygiene which would be provided by her nieces.  She could communicate her needs without a problem.  She was tolerating a regular consistency diet.  Speech therapy continued to work with memory drills, targeting use of compensatory strategies.  She was able to recall therapy schedule modified independence.  The patient modified independence for making changes and counting money with 80% accuracy.  Full family teaching was completed and plan discharge to home.  DISCHARGE MEDICATIONS: 1. Lipitor 80 mg p.o. daily. 2. Plavix 75 mg p.o. daily. 3. Flexeril 5 mg p.o. b.i.d. 4. Restasis 0.05% ophthalmic solution 1 drop both eyes twice daily. 5. Pepcid 20 mg p.o. b.i.d. 6. Neurontin 300 mg p.o. t.i.d. 7. Xalatan ophthalmic solution 0.005% 1 drop both eyes daily at     bedtime. 8. Lidoderm patch, change administer every 12 hours. 9. Protonix 40 mg p.o. b.i.d. 10.Requip 0.25 mg p.o. t.i.d. 11.Zoloft 50 mg p.o. daily. 12.Percocet 7.5-325 mg 1 p.o. t.i.d. 13.Ultracet 1 tablet p.o. every 4 hours as needed pain, dispense of 30     tablets.  DIET:  A regular consistency.  FOLLOWUP:  She would follow up with Dr. Claudette LawsAndrew Kirsteins at the outpatient rehab center as directed; Dr. Mikeal HawthorneGarba, medical management; Dr. Roda ShuttersXu, Neurology Service in 1 month.  Ongoing therapies arranged as per rehab services.     Mariam Dollaraniel Angiulli, P.A.   ______________________________ Erick ColaceAndrew E. Kirsteins, M.D.    DA/MEDQ  D:  10/25/2015  T:  10/25/2015  Job:  [id number]  cc:   Marvel PlanJindong Xu, Dr. Lonia BloodLawal Garba, M.D.

## 2015-10-25 NOTE — Progress Notes (Signed)
Physical Therapy Session Note  Patient Details  Name: [name] MRN: [id number] Date of Birth: Feb 19, 1956  Today's Date: 10/25/2015 PT Individual Time: 0903-1000 PT Individual Time Calculation (min): 57 min    Short Term Goals: Week 4:  PT Short Term Goal 1 (Week 4): STG+LTG due to LOS, S transfers and WC, Min A gait x 50'  Skilled Therapeutic Interventions/Progress Updates:    PT assisted pt with donning AFO and shoes to prepare for session. Session focused on functional transfers, gait training with RW and L AFO in controlled and home environment setting, neuro re-ed for postural control and weighshifting, stair negotiation training for neuro re-ed and preparation for home (min assist with verbal cues for which foot to lead with), and w/c mobility at supervision level with verbal cues for attention to L foot when using BLE and BUE for propulsion. Pt requires overall min assist for balance and transfers with up to mod verbal cues for L foot placement and positioning of RW during turns. During gait, requires manual facilitation for weightshifting onto R for increased clearance of LLE during swing phase and mod verbal cues. Blocked practice sit <> stands without assistive device for neuro re-ed and postural control re-training due to tendency for L lateral lean and decreased weightbearing to RLE during transfers and gait. End of session set up in w/c with safety belt donned and all needs in reach.   Therapy Documentation Precautions:  Precautions Precautions: Fall Precaution Comments: left hemiparesis, pusher to the left Restrictions Weight Bearing Restrictions: No  Pain: Pain Assessment Pain Assessment: 0-10 Pain Score: 7  Pain Type: Acute pain Pain Location: Shoulder Pain Orientation: Left Pain Descriptors / Indicators: Aching Pain Onset: On-going Pain Intervention(s): Medication (See eMAR)   See Function Navigator for Current Functional Status.   Therapy/Group: Individual  Therapy  Karolee StampsGray, Vue Pavon Darrol PokeBrescia  Breianna Delfino B. Alphonza Tramell, PT, DPT  10/25/2015, 10:16 AM

## 2015-10-25 NOTE — Discharge Summary (Signed)
Discharge summary job # [id number]

## 2015-10-26 ENCOUNTER — Inpatient Hospital Stay (HOSPITAL_COMMUNITY): Payer: Medicaid Other | Admitting: Occupational Therapy

## 2015-10-26 ENCOUNTER — Inpatient Hospital Stay (HOSPITAL_COMMUNITY): Payer: Medicaid Other

## 2015-10-26 MED ORDER — OXYCODONE-ACETAMINOPHEN 7.5-325 MG PO TABS: 1.0000 | ORAL_TABLET | Freq: Three times a day (TID) | ORAL | 0 refills | Status: DC

## 2015-10-26 MED ORDER — SERTRALINE HCL 50 MG PO TABS: 50.0000 mg | ORAL_TABLET | Freq: Every day | ORAL | 0 refills | Status: AC

## 2015-10-26 MED ORDER — FAMOTIDINE 20 MG PO TABS: 20.0000 mg | ORAL_TABLET | Freq: Two times a day (BID) | ORAL | 0 refills | Status: DC

## 2015-10-26 MED ORDER — LIDOCAINE 5 % EX PTCH: 1.0000 | MEDICATED_PATCH | CUTANEOUS | 0 refills | Status: DC

## 2015-10-26 MED ORDER — ROPINIROLE HCL 0.25 MG PO TABS: 0.2500 mg | ORAL_TABLET | Freq: Three times a day (TID) | ORAL | 0 refills | Status: DC

## 2015-10-26 MED ORDER — GABAPENTIN 300 MG PO CAPS: 300.0000 mg | ORAL_CAPSULE | Freq: Three times a day (TID) | ORAL | 1 refills | Status: AC

## 2015-10-26 MED ORDER — TRAMADOL-ACETAMINOPHEN 37.5-325 MG PO TABS: 1.0000 | ORAL_TABLET | ORAL | 0 refills | Status: DC | PRN

## 2015-10-26 MED ORDER — CLOPIDOGREL BISULFATE 75 MG PO TABS: 75.0000 mg | ORAL_TABLET | Freq: Every day | ORAL | 0 refills | Status: DC

## 2015-10-26 MED ORDER — ATORVASTATIN CALCIUM 80 MG PO TABS: 80.0000 mg | ORAL_TABLET | Freq: Every day | ORAL | 0 refills | Status: DC

## 2015-10-26 MED ORDER — PANTOPRAZOLE SODIUM 40 MG PO TBEC: 40.0000 mg | DELAYED_RELEASE_TABLET | Freq: Two times a day (BID) | ORAL | 0 refills | Status: DC

## 2015-10-26 MED ORDER — CYCLOBENZAPRINE HCL 5 MG PO TABS: 5.0000 mg | ORAL_TABLET | Freq: Two times a day (BID) | ORAL | 0 refills | Status: DC

## 2015-10-26 MED ORDER — CYCLOSPORINE 0.05 % OP EMUL: 1.0000 [drp] | Freq: Two times a day (BID) | OPHTHALMIC | 0 refills | Status: DC

## 2015-10-26 NOTE — Progress Notes (Signed)
Physical Therapy Discharge Summary  Patient Details  Name: [name] MRN: [id number] Date of Birth: Jan 23, 1956  Today's Date: 10/26/2015 PT Individual Time: 1100-1205 PT Individual Time Calculation (min): 65 min   Patient has met 10 of 10 long term goals due to improved activity tolerance, improved balance, improved postural control, increased strength, ability to compensate for deficits, functional use of  left upper extremity and left lower extremity, improved attention and improved awareness.  Patient to discharge at an ambulatory level [place].   Patient's care partner is independent to provide the necessary physical and cognitive assistance at discharge.  Reasons goals not met: n/a  Recommendation:  Patient will benefit from ongoing skilled PT services in home health setting to continue to advance safe functional mobility, address ongoing impairments in motor, balance, pusher tendencies, cognition, and minimize fall risk.  Equipment: RW, LAFO from [employer]  Reasons for discharge: treatment goals met and discharge from hospital  Patient/family agrees with progress made and goals achieved: Yes  PT Discharge Family ed with niece [name] for safe return demonstrations for: simulated car transfer to sedan ht seat, gait on level tile, gait up/down flight of stairs to simulate home setting,  PT instructed [name] on use of 911 for fall recovery. With PT, gait x 150' on level tile with min assist/supervision, cues for upright trunk, forward gaze, longer steps to facilitate wt shifting to R.  Precautions/RestrictionsPrecautions Precautions: Fall Precaution Comments: left hemiparesis, pusher to the left Restrictions Weight Bearing Restrictions: No   Pain Pain Assessment Pain Assessment: No/denies pain Vision/Perception - legally blind PTA; at baseline    Cognition Overall Cognitive Status: History of cognitive impairments - at baseline Arousal/Alertness: Awake/alert Orientation  Level: Oriented X4 Memory: Impaired Memory Impairment: Retrieval deficit;Decreased recall of new information Sensation Sensation Light Touch: Not tested Proprioception: Not tested Coordination Gross Motor Movements are Fluid and Coordinated: No Fine Motor Movements are Fluid and Coordinated: No Motor  Motor Motor: Hemiplegia;Abnormal postural alignment and control Motor - Discharge Observations: hypertonic LLE  Mobility Bed Mobility Bed Mobility: Supine to Sit;Sit to Supine Supine to Sit: 5: Supervision Supine to Sit Details: Verbal cues for technique;Verbal cues for sequencing Sit to Supine: 5: Supervision Sit to Supine - Details: Verbal cues for technique Transfers Transfers: Yes Sit to Stand: 5: Supervision Sit to Stand Details: Verbal cues for safe use of DME/AE Stand to Sit: 5: Supervision Stand to Sit Details (indicate cue type and reason): Verbal cues for safe use of DME/AE Stand Pivot Transfers: 4: Min guard Squat Pivot Transfers: 4: Min guard Locomotion  Ambulation Ambulation: Yes Ambulation/Gait Assistance: 4: Min assist Ambulation Distance (Feet): 150 Feet Assistive device: Rolling walker Ambulation/Gait Assistance Details: Verbal cues for technique;Verbal cues for gait pattern;Verbal cues for safe use of DME/AE;Manual facilitation for weight shifting Gait Gait: Yes Gait Pattern: Impaired Gait Pattern: Step-through pattern;Decreased step length - left;Decreased hip/knee flexion - left;Decreased dorsiflexion - left;Decreased weight shift to right;Lateral trunk lean to left;Trunk flexed;Poor foot clearance - left Gait velocity: with AFO, 0.96'/sec for 10MWT Stairs / Additional Locomotion Stairs: Yes Stairs Assistance: 4: Min assist Stairs Assistance Details: Manual facilitation for weight shifting;Verbal cues for gait pattern Stair Management Technique: One rail Right Number of Stairs: 12 Height of Stairs: 6 Ramp: 4: Min assist Curb: 3: Mod Engineer, drilling Mobility: Yes Wheelchair Assistance: 5: Supervision Wheelchair Propulsion: Right upper extremity;Both lower extermities Wheelchair Parts Management: Supervision/cueing Distance: 150  Trunk/Postural Assessment  Cervical Assessment Cervical Assessment: Exceptions to  WFL (rests in slight R rotation) Thoracic Assessment Thoracic Assessment: Within Functional Limits Lumbar Assessment Lumbar Assessment: Exceptions to [facility] (increased wt bearing L pelvis in sitting and standing) Postural Control Postural Control: Deficits on evaluation Righting Reactions: delayed and inadequate in sitting and standing due to pushing tendencies toward hemi side  Balance Balance Balance Assessed: Yes Static Sitting Balance Static Sitting - Balance Support: Feet unsupported Static Sitting - Level of Assistance: 6: Modified independent (Device/Increase time) Dynamic Sitting Balance Dynamic Sitting - Balance Support: No upper extremity supported;Feet unsupported Dynamic Sitting - Level of Assistance: 5: Stand by assistance Static Standing Balance Static Standing - Balance Support: Right upper extremity supported Static Standing - Level of Assistance: 5: Stand by assistance Dynamic Standing Balance Dynamic Standing - Balance Support: Bilateral upper extremity supported Dynamic Standing - Level of Assistance: 4: Min assist Extremity Assessment      RLE Assessment RLE Assessment: Within Functional Limits LLE Assessment LLE Assessment: Exceptions to [facility] LLE Strength LLE Overall Strength Comments: grossly in sitting: hip flex/abd and knee flex 4-/5; knee ext 4/5; ankle DF 1/5 mostly inversion   See Function Navigator for Current Functional Status.  [name] 10/26/2015, 12:24 PM

## 2015-10-26 NOTE — Progress Notes (Signed)
Occupational Therapy Discharge Summary  Patient Details  Name: [name] MRN: [id number] Date of Birth: [birth year]  Today's Date: 10/26/2015 OT Individual Time: 1002-1100 OT Individual Time Calculation (min): 58 min   Session Note:  Focused session on family education with pt's nieces.  Pt completed shower transfers with use of the RW and min assist.  All bathing at supervision level sit to stand with use of the grab bar.  Discussed with family the need for a shower/tub bench, 3:1,  as well as a hand held shower for home.  They agree and therapist instructed SW to order bench and 3:1.  Dressing completed with overall min assist for pulling down sports bra in the back and fastening velcro straps on the AFO.  Discussed recommendations for LUE functional tasks that can be completed at home with supervision such as standing to wash countertops and cabinets, reaching into cabinets, picking up change and manipulating from finger to palm, and opening small containers.  Pt left in wheelchair with family present.    Patient has met 11 of 12 long term goals due to improved balance, postural control, ability to compensate for deficits, functional use of  LEFT upper and LEFT lower extremity, improved awareness and improved coordination.  Patient to discharge at [facility] Assist level.  Patient's care partner is independent to provide the necessary physical and cognitive assistance at discharge.    Reasons goals not met: Pt needs min assist for pulling down her bra in the back.  Recommendation:  Patient will benefit from ongoing skilled OT services in home health setting to continue to advance functional skills in the area of BADL.  Pt needs continued neuromuscular re-education for the LUE, LLE and trunk as she is currently still at a min assist level overall.  Pusher tendencies to the left have improved, but are still present with dynamic sitting and standing tasks.  Recommend 24 hour supervision at  discharge with continued HHOT to further progress ADL independence.    Equipment: tub bench, 3:1  Reasons for discharge: treatment goals met and discharge from hospital  Patient/family agrees with progress made and goals achieved: Yes  OT Discharge Precautions/Restrictions Precautions Precautions: Fall Precaution Comments: left hemiparesis, pusher to the left Restrictions Weight Bearing Restrictions: No  Pain Pain Assessment Pain Assessment: No/denies pain ADL  See Function Section of chart for details  Vision/Perception  Vision- History Baseline Vision/History: Legally blind Patient Visual Report: No change from baseline  Cognition Overall Cognitive Status: Impaired/Different from baseline Arousal/Alertness: Awake/alert Orientation Level: Oriented X4 Attention: Selective Sustained Attention: Appears intact Selective Attention: Appears intact Selective Attention Impairment: Verbal basic;Functional basic Memory: Impaired Memory Impairment: Retrieval deficit;Decreased recall of new information Decreased Short Term Memory: Functional basic;Verbal basic Awareness: Impaired Awareness Impairment: Anticipatory impairment Problem Solving: Impaired Problem Solving Impairment: Functional complex Safety/Judgment: Impaired Comments: Pt still needs cueing for safety prior to standing tasks. Sensation Sensation Light Touch: Appears Intact Stereognosis: Impaired Detail Stereognosis Impaired Details: Impaired LUE Proprioception: Impaired Detail Proprioception Impaired Details: Impaired LUE Coordination Gross Motor Movements are Fluid and Coordinated: No Fine Motor Movements are Fluid and Coordinated: No Coordination and Movement Description: Pt currently demonstrates LUE functional use at a diminished level.  She can use for all bathing and some dressing but still exhbits difficulty when attempting to pull up pants on the left hip.   Motor  Motor Motor: Hemiplegia;Abnormal  postural alignment and control Motor - Discharge Observations: hypertonic LLE Mobility  Bed Mobility Bed Mobility: Supine to Sit;Sit to  Supine Supine to Sit: 5: Supervision Supine to Sit Details: Verbal cues for technique;Verbal cues for sequencing Sit to Supine: 5: Supervision Sit to Supine - Details: Verbal cues for technique Transfers Transfers: Sit to Stand;Stand to Sit Sit to Stand: 5: Supervision;With armrests;With upper extremity assist Sit to Stand Details: Verbal cues for safe use of DME/AE Stand to Sit: 5: Supervision;With upper extremity assist;With armrests Stand to Sit Details (indicate cue type and reason): Verbal cues for safe use of DME/AE  Trunk/Postural Assessment  Cervical Assessment Cervical Assessment: Exceptions to [facility] Thoracic Assessment Thoracic Assessment: Exceptions to [facility] (left lateral thoracic passive elongation) Lumbar Assessment Lumbar Assessment: Exceptions to [facility] (posterior pelvic tilt) Postural Control Postural Control: Deficits on evaluation Righting Reactions: Pt still with posterior and left lean in sitting with posterior pelvic tilt.  Balance Balance Balance Assessed: Yes Static Sitting Balance Static Sitting - Balance Support: Feet unsupported Static Sitting - Level of Assistance: 6: Modified independent (Device/Increase time) Dynamic Sitting Balance Dynamic Sitting - Balance Support: No upper extremity supported;Feet unsupported Dynamic Sitting - Level of Assistance: 5: Stand by assistance Static Standing Balance Static Standing - Balance Support: Right upper extremity supported Static Standing - Level of Assistance: 5: Stand by assistance Dynamic Standing Balance Dynamic Standing - Balance Support: Bilateral upper extremity supported Dynamic Standing - Level of Assistance: 4: Min assist Extremity/Trunk Assessment RUE Assessment RUE Assessment: Within Functional Limits LUE Assessment LUE Assessment: Exceptions to [facility] LUE  Strength LUE Overall Strength Comments: Pt with isolated joint movement from shoulder to digits.  Still with slight synergy pattern however with incorporation of shoulder and hand movements such as when attempting to pull up pants or reach overhead.  She is able to oppose thumb to all digits but still demonstrates decreased FM control when attempting to open lids or complete finger to hand manipulation.     See Function Navigator for Current Functional Status.  Evon Lopezperez OTR/L 10/26/2015, 3:57 PM

## 2015-10-26 NOTE — Plan of Care (Signed)
Problem: RH Dressing Goal: LTG Patient will perform upper body dressing (OT) LTG Patient will perform upper body dressing with assist, with/without cues (OT).  Outcome: Not Met (add Reason) Min assist needed for pulling sports bra down in the back.

## 2015-10-26 NOTE — Progress Notes (Signed)
Subjective/Complaints: Pt sitting up in bed eating breakfast.  She is ready for discharge.   ROS: Denies N/V/D, CP, SOB.  Objective: Vital Signs: Blood pressure 119/69, pulse 91, temperature 97.8 F (36.6 C), temperature source Oral, resp. rate 18, weight 64.3 kg (141 lb 11.2 oz), SpO2 98 %. No results found. No results found for this or any previous visit (from the past 72 hour(s)).  Gen: NAD. Vital signs reviewed.  HEENT: Normocephalic, atraumatic Cardio: RRR and no murmur Resp: CTA B/L and unlabored GI: BS positive and NT,ND Musc/Skel:  No edema.  No tenderness. Neuro: Alert. Motor 5/5 in RUE and RLE LUE: 4+/5 L delt, bi, tri, grip  LLE: 3-/5 HF KE, 2+ ADF/PF  Skin:  Warm and dry. Psych: Flat affect (slightly improved)  Assessment/Plan: 1. Functional deficits secondary to Left hemiparesis which require 3+ hours per day of interdisciplinary therapy in a comprehensive inpatient rehab setting. Physiatrist is providing close team supervision and 24 hour management of active medical problems listed below. Physiatrist and rehab team continue to assess barriers to discharge/monitor patient progress toward functional and medical goals. FIM: Function - Bathing Position: Wheelchair/chair at sink Body parts bathed by patient: Right arm, Left arm, Abdomen, Front perineal area, Chest, Buttocks, Right upper leg, Left upper leg, Right lower leg, Left lower leg, Back Body parts bathed by helper: Back Bathing not applicable: Back Assist Level: Touching or steadying assistance(Pt > 75%)  Function- Upper Body Dressing/Undressing What is the patient wearing?: Pull over shirt/dress Bra - Perfomed by patient: Thread/unthread right bra strap, Thread/unthread left bra strap Bra - Perfomed by helper: Hook/unhook bra (pull down sports bra) Pull over shirt/dress - Perfomed by patient: Thread/unthread right sleeve, Put head through opening Pull over shirt/dress - Perfomed by helper: Thread/unthread  left sleeve, Pull shirt over trunk Assist Level: Touching or steadying assistance(Pt > 75%) Set up : To obtain clothing/put away Function - Lower Body Dressing/Undressing What is the patient wearing?: Pants, Underwear, Socks Position: Wheelchair/chair at sink Underwear - Performed by patient: Thread/unthread right underwear leg, Thread/unthread left underwear leg, Pull underwear up/down Underwear - Performed by helper: Pull underwear up/down Pants- Performed by patient: Thread/unthread right pants leg Pants- Performed by helper: Thread/unthread left pants leg, Pull pants up/down Non-skid slipper socks- Performed by patient: Don/doff right sock, Don/doff left sock Non-skid slipper socks- Performed by helper: Don/doff right sock, Don/doff left sock Socks - Performed by patient: Don/doff right sock, Don/doff left sock Shoes - Performed by patient: Don/doff right shoe, Don/doff left shoe Shoes - Performed by helper: Fasten right, Fasten left AFO - Performed by patient: Don/doff left AFO AFO - Performed by helper: Don/doff left AFO TED Hose - Performed by patient: Don/doff right TED hose, Don/doff left TED hose TED Hose - Performed by helper: Don/doff right TED hose, Don/doff left TED hose Assist for footwear: Partial/moderate assist Assist for lower body dressing: Touching or steadying assistance (Pt > 75%)  Function - Toileting Toileting activity did not occur: N/A Toileting steps completed by patient: Adjust clothing prior to toileting, Performs perineal hygiene Toileting steps completed by helper: Adjust clothing after toileting Toileting Assistive Devices: Grab bar or rail Assist level: Touching or steadying assistance (Pt.75%)  Function - ArchivistToilet Transfers Toilet transfer assistive device: Walker Assist level to toilet: Touching or steadying assistance (Pt > 75%) Assist level from toilet: Touching or steadying assistance (Pt > 75%) Assist level to bedside commode (at bedside):  Touching or steadying assistance (Pt > 75%) Assist level from bedside commode (at  bedside): Touching or steadying assistance (Pt > 75%)  Function - Chair/bed transfer Chair/bed transfer activity did not occur: N/A Chair/bed transfer method: Stand pivot, Ambulatory Chair/bed transfer assist level: Touching or steadying assistance (Pt > 75%) Chair/bed transfer assistive device: Armrests, Walker, Orthosis Chair/bed transfer details: Verbal cues for technique, Verbal cues for precautions/safety, Verbal cues for safe use of DME/AE, Verbal cues for sequencing  Function - Locomotion: Wheelchair Will patient use wheelchair at discharge?: Yes Type: Manual Max wheelchair distance: 150' Assist Level: Supervision or verbal cues Wheel 50 feet with 2 turns activity did not occur: Safety/medical concerns Assist Level: Supervision or verbal cues Wheel 150 feet activity did not occur: Safety/medical concerns Assist Level: Supervision or verbal cues Turns around,maneuvers to table,bed, and toilet,negotiates 3% grade,maneuvers on rugs and over doorsills: No Function - Locomotion: Ambulation Assistive device: Walker-rolling, Orthosis Max distance: 6265' Assist level: Touching or steadying assistance (Pt > 75%) Assist level: Touching or steadying assistance (Pt > 75%) Assist level: Touching or steadying assistance (Pt > 75%) Assist level: Moderate assist (Pt 50 - 74%) Walk 10 feet on uneven surfaces activity did not occur: Safety/medical concerns  Function - Comprehension Comprehension: Auditory Comprehension assist level: Follows basic conversation/direction with no assist  Function - Expression Expression: Verbal Expression assist level: Expresses basic needs/ideas: With no assist  Function - Social Interaction Social Interaction assist level: Interacts appropriately 90% of the time - Needs monitoring or encouragement for participation or interaction.  Function - Problem Solving Problem solving  assist level: Solves basic problems with no assist  Function - Memory Memory assist level: Recognizes or recalls 90% of the time/requires cueing < 10% of the time Patient normally able to recall (first 3 days only): Current season, Location of own room, Staff names and faces, That he or she is in a hospital  Medical Problem List and Plan: 1.  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  Will have pt follow up with Dr. Wynn BankerKirsteins for transitional care management in 1-2 weeks.  -AFO  -left heel cord tight, improving 2.  DVT Prophylaxis/Anticoagulation: Subcutaneous Lovenox. Monitor platelet counts in signs of bleeding 3. Pain Management/chronic back pain. Flexeril 5 mg twice a day, baclofen 5mg  BID with no increase in spasticity d/ced  Cont Neurontin 300 mg 3 times a day and Percocet as needed.Chronic narcotics as outpt , added kpad for back    -No pain c/os on oxycodone similar to home dosing 8am, 6pm, 11pm   -Tramadol for moderate pain and oxycodone for severe pain 4. Mood/PTSD. Zoloft 50 mg daily, Requip 0.25 mg 3 times a day 5. Neuropsych: This patient is capable of making decisions on her own behalf. 6. Skin/Wound Care: Routine skin checks, prob tinea pedis R foot started clotrimazole BID 7. Fluids/Electrolytes/Nutrition: Routine I&O's 8. Tobacco alcohol abuse. Counseling. No nicotine craving reported 9. History of GI bleed. Continue Pepcid twice a day.   -Hb 13.4 on 8/1 10.  Hx Alcohol abuse denies ETOH for ~4624mo 11. Urnary incont UA neg, may have spastic bladder r/t CVA, monitor nocturia episode  -Oxybutnin held due to retention  -Repeat UA neg 12.  Constipation- improved  senna S 13. Sleep disturbance: prn trazodone  LOS (Days) 25 A FACE TO FACE EVALUATION WAS PERFORMED  Ankit Karis Jubanil Patel 10/26/2015, 9:21 AM

## 2015-10-26 NOTE — Progress Notes (Signed)
Pt. Got d/c papers,follow up appointments and equipment.Pt. Ready to go home with family.

## 2015-10-27 ENCOUNTER — Telehealth: Payer: Self-pay | Admitting: *Deleted

## 2015-10-27 NOTE — Telephone Encounter (Signed)
Transitional Care Questions I spoke niece [name] and Mrs Chelsea AusMcAdoo is at home. I called the home and spoke with Mrs Chelsea AusMcAdoo and her CNA @8 :56 am    1. Are you/is patient experiencing any problems since coming home? Are there any questions regarding any aspect of care? No  2. Are there any questions regarding medications administration/dosing? Are meds being taken as prescribed? Patient should review meds with caller to confirm She has her medication but was unable to get lidocaine patches without prior authorization.  We will submit a PA but she has not had shingles and is not a diabetic either. 3. Have there been any falls?No 4. Has Home Health been to the house and/or have they contacted you? If not, have you tried to contact them? Can we help you contact them?Home care has not called yet but she has her CNA from [facility] 5. Are bowels and bladder emptying properly? Are there any unexpected incontinence issues? If applicable, is patient following bowel/bladder programs? No problems 6. Any fevers, problems with breathing, unexpected pain? No 7. Are there any skin problems or new areas of breakdown?No 8. Has the patient/family member arranged specialty MD follow up (ie cardiology/neurology/renal/surgical/etc)?  Can we help arrange?Appt given today for Dr Allena KatzPatel and she has her neurology appt scheduled 9. Does the patient need any other services or support that we can help arrange?No 10. Are caregivers following through as expected in assisting the patient?Yes             Has the patient quit smoking, drinking alcohol, or using drugs as recommended? yes  Appointment with Dr Allena KatzPatel Thursday 11/05/15 @ 9:00 arrive at 8:30.  Will follow up with Kirsteins thereafter. Address reviewed.

## 2015-11-03 ENCOUNTER — Telehealth: Payer: Self-pay | Admitting: Physical Medicine & Rehabilitation

## 2015-11-03 NOTE — Telephone Encounter (Signed)
Katlyn RN with [facility] needs to get a verbal order to send a social worker out to see patient.  Per her caregiver, patient has fallen 10 times since coming home.  She is not using her assist devices or asking for help to get around.  Charlesetta GaribaldiKatlyn thinks the Child psychotherapistsocial worker can try to get her in a rehab facility to help her more.  Please call her at [phone].

## 2015-11-04 NOTE — Telephone Encounter (Signed)
Left message with Yvonna AlanisKaitlyn to approve verbal orders per office protocol.

## 2015-11-05 ENCOUNTER — Encounter: Payer: Medicaid Other | Attending: Physical Medicine & Rehabilitation | Admitting: Physical Medicine & Rehabilitation

## 2015-11-05 ENCOUNTER — Encounter: Payer: Self-pay | Admitting: Physical Medicine & Rehabilitation

## 2015-11-05 ENCOUNTER — Other Ambulatory Visit: Payer: Self-pay | Admitting: Physical Medicine & Rehabilitation

## 2015-11-05 VITALS — BP 148/82 | HR 97 | Resp 14

## 2015-11-05 DIAGNOSIS — Z5189 Encounter for other specified aftercare: Secondary | ICD-10-CM | POA: Insufficient documentation

## 2015-11-05 DIAGNOSIS — IMO0002 Reserved for concepts with insufficient information to code with codable children: Secondary | ICD-10-CM | POA: Insufficient documentation

## 2015-11-05 DIAGNOSIS — K5900 Constipation, unspecified: Secondary | ICD-10-CM | POA: Diagnosis not present

## 2015-11-05 DIAGNOSIS — F329 Major depressive disorder, single episode, unspecified: Secondary | ICD-10-CM | POA: Diagnosis not present

## 2015-11-05 DIAGNOSIS — R209 Unspecified disturbances of skin sensation: Secondary | ICD-10-CM

## 2015-11-05 DIAGNOSIS — K760 Fatty (change of) liver, not elsewhere classified: Secondary | ICD-10-CM | POA: Diagnosis not present

## 2015-11-05 DIAGNOSIS — F1010 Alcohol abuse, uncomplicated: Secondary | ICD-10-CM | POA: Insufficient documentation

## 2015-11-05 DIAGNOSIS — I69398 Other sequelae of cerebral infarction: Secondary | ICD-10-CM

## 2015-11-05 DIAGNOSIS — I69352 Hemiplegia and hemiparesis following cerebral infarction affecting left dominant side: Secondary | ICD-10-CM | POA: Diagnosis present

## 2015-11-05 DIAGNOSIS — F4310 Post-traumatic stress disorder, unspecified: Secondary | ICD-10-CM | POA: Insufficient documentation

## 2015-11-05 DIAGNOSIS — Z720 Tobacco use: Secondary | ICD-10-CM | POA: Diagnosis not present

## 2015-11-05 DIAGNOSIS — I69359 Hemiplegia and hemiparesis following cerebral infarction affecting unspecified side: Secondary | ICD-10-CM

## 2015-11-05 DIAGNOSIS — I638 Other cerebral infarction: Secondary | ICD-10-CM | POA: Insufficient documentation

## 2015-11-05 DIAGNOSIS — K5903 Drug induced constipation: Secondary | ICD-10-CM | POA: Diagnosis not present

## 2015-11-05 DIAGNOSIS — G8112 Spastic hemiplegia affecting left dominant side: Secondary | ICD-10-CM | POA: Diagnosis not present

## 2015-11-05 DIAGNOSIS — R269 Unspecified abnormalities of gait and mobility: Secondary | ICD-10-CM | POA: Diagnosis not present

## 2015-11-05 DIAGNOSIS — I69898 Other sequelae of other cerebrovascular disease: Secondary | ICD-10-CM

## 2015-11-05 DIAGNOSIS — K859 Acute pancreatitis without necrosis or infection, unspecified: Secondary | ICD-10-CM | POA: Insufficient documentation

## 2015-11-05 DIAGNOSIS — G8929 Other chronic pain: Secondary | ICD-10-CM | POA: Insufficient documentation

## 2015-11-05 DIAGNOSIS — Z87440 Personal history of urinary (tract) infections: Secondary | ICD-10-CM | POA: Diagnosis not present

## 2015-11-05 DIAGNOSIS — M549 Dorsalgia, unspecified: Secondary | ICD-10-CM | POA: Diagnosis not present

## 2015-11-05 DIAGNOSIS — M545 Low back pain: Secondary | ICD-10-CM | POA: Diagnosis not present

## 2015-11-05 DIAGNOSIS — N3289 Other specified disorders of bladder: Secondary | ICD-10-CM | POA: Insufficient documentation

## 2015-11-05 DIAGNOSIS — I69322 Dysarthria following cerebral infarction: Secondary | ICD-10-CM

## 2015-11-05 HISTORY — DX: Spastic hemiplegia affecting left dominant side: G81.12

## 2015-11-05 MED ORDER — TIZANIDINE HCL 4 MG PO TABS: 2.0000 mg | ORAL_TABLET | Freq: Two times a day (BID) | ORAL | 1 refills | Status: AC

## 2015-11-05 MED ORDER — OXYBUTYNIN CHLORIDE ER 5 MG PO TB24: 5.0000 mg | ORAL_TABLET | Freq: Every day | ORAL | 1 refills | Status: DC

## 2015-11-05 MED ORDER — SENNOSIDES-DOCUSATE SODIUM 8.6-50 MG PO TABS: 1.0000 | ORAL_TABLET | Freq: Two times a day (BID) | ORAL | 1 refills | Status: AC

## 2015-11-05 NOTE — Progress Notes (Signed)
Subjective:    Patient ID: [name], female    DOB: 1955/05/03, 60 y.o.   MRN: [id number]  HPI 60 year old right-handed female with history of chronic back pain, tobacco and alcohol abuse, PTSD, and gastrointestinal bleed presents for transitional care management after receiving CIR for right posterior corona radiate infarct. DATE OF ADMISSION:  09/29/2015 DATE OF DISCHARGE:  10/26/2015 She states she feels hypertonicity in her foot.  She is no longer smoking.  She continues to have trouble with urinary urgency.  She has not seen her PCP.  She has an appointment with see Neurology.  Since discharge, she has had multiple falls after discharge. PT starts in home tomorrow.  She also is followed by a pain management MD Pernell Dupre).  She is falling more because she is not using assistance and trying to do activities independently.   DME: Shower bench, 3 in 1.  Therapies: 2/week. Mobility: Walker at all times.  Pain Inventory Average Pain 5 Pain Right Now 1 My pain is constant and aching  In the last 24 hours, has pain interfered with the following? General activity 0 Relation with others 0 Enjoyment of life 0 What TIME of day is your pain at its worst? daytime Sleep (in general) Fair  Pain is worse with: sitting Pain improves with: medication Relief from Meds: 10  Mobility walk with assistance use a walker how many minutes can you walk? 5 ability to climb steps?  yes do you drive?  no needs help with transfers  Function disabled: date disabled july 2017 I need assistance with the following:  dressing, bathing, toileting, meal prep, household duties and shopping  Neuro/Psych bladder control problems weakness numbness trouble walking confusion  Prior Studies Any changes since last visit?  no  Physicians involved in your care Any changes since last visit?  no   Family History  Problem Relation Age of Onset  . Stroke Mother   . Cancer Sister     type unknown  .  Diabetes Father    Social History   Social History  . Marital status: Widowed    Spouse name: N/A  . Number of children: 0  . Years of education: N/A   Social History Main Topics  . Smoking status: Former Smoker    Packs/day: 0.50    Types: Cigarettes    Quit date: 09/26/2015  . Smokeless tobacco: Former Neurosurgeon  . Alcohol use No  . Drug use: No  . Sexual activity: Not Asked   Other Topics Concern  . None   Social History Narrative  . None   Past Surgical History:  Procedure Laterality Date  . ABDOMINAL HYSTERECTOMY    . APPENDECTOMY    . COLONOSCOPY WITH PROPOFOL N/A 08/13/2015   Procedure: COLONOSCOPY WITH PROPOFOL;  Surgeon: Rachael Fee, MD;  Location: [facility] ENDOSCOPY;  Service: Endoscopy;  Laterality: N/A;  . ESOPHAGOGASTRODUODENOSCOPY N/A 05/23/2014   Procedure: ESOPHAGOGASTRODUODENOSCOPY (EGD);  Surgeon: Beverley Fiedler, MD;  Location: [facility] ENDOSCOPY;  Service: Endoscopy;  Laterality: N/A;  . ESOPHAGOGASTRODUODENOSCOPY (EGD) WITH PROPOFOL N/A 08/13/2015   Procedure: ESOPHAGOGASTRODUODENOSCOPY (EGD) WITH PROPOFOL;  Surgeon: Rachael Fee, MD;  Location: [facility] ENDOSCOPY;  Service: Endoscopy;  Laterality: N/A;  . ESOPHAGOGASTRODUODENOSCOPY ENDOSCOPY    . gall stone removed     Past Medical History:  Diagnosis Date  . Alcohol abuse   . Chronic back pain   . Colitis   . CVA (cerebral infarction)   . Fatty liver   . Gastric ulcer   .  GI bleed   . Hepatitis    Hepatitis C  . MDD (major depressive disorder) (HCC)   . Pancreatitis   . PTSD (post-traumatic stress disorder)   . Stroke (HCC)   . UTI (lower urinary tract infection)    BP (!) 148/82 (BP Location: Left Arm, Patient Position: Sitting, Cuff Size: Normal)   Pulse 97   Resp 14   SpO2 90%   Opioid Risk Score:   Fall Risk Score:  `1  Depression screen PHQ 2/9  Depression screen PHQ 2/9 11/05/2015  Decreased Interest 0  Down, Depressed, Hopeless 0  PHQ - 2 Score 0  Altered sleeping 0  Tired, decreased energy 2   Change in appetite 0  Feeling bad or failure about yourself  0  Trouble concentrating 0  Moving slowly or fidgety/restless 0  Suicidal thoughts 0  PHQ-9 Score 2  Difficult doing work/chores Not difficult at all   Review of Systems  Musculoskeletal: Positive for back pain, gait problem and myalgias.  Neurological: Positive for speech difficulty, weakness and numbness.  All other systems reviewed and are negative.      Objective:   Physical Exam Gen: NAD. Vital signs reviewed.  HEENT: Normocephalic, atraumatic Cardio: RRR and no murmur Resp: CTA B/L and unlabored GI: BS positive and NT,ND Musc/Skel:  No edema.  No tenderness. Gait: Step to gait with decrease cadence and foot drag Neuro: Alert. Dysarthria Motor 5/5 in RUE and RLE LUE: 4+/5 L delt, bi, tri, grip  LLE: 4-/5 HF KE, 2 ADF/PF  mAS 1/4 left ankle DTRs symmetric Sensation diminished to light touch in left foot Skin:  Warm and dry. Psych: Flat affect. Impulsive.     Assessment & Plan:  60 year old right-handed female with history of chronic back pain, tobacco and alcohol abuse, PTSD, and gastrointestinal bleed presents for transitional care management after receiving CIR for right posterior corona radiate infarct.  1. Nonhemorrhagic infarct posterior right corona radiata  Cont meds  Follow up with Neurology  2. Post-stroke gait abnormality   Cont AFO  Cont walker for safety  Cont therapies   3. Chronic back pain  Cont meds per pain management  4. Spastic hemiplegia affecting left dominant side  Currently Tizanidine 2mg , qhs, will increase to BID  Slight increase in tone, will potentially benefit from botulism toxin injection in future, however, relatively mild at present  5. PTSD  Cont meds per PCP, consider weaning  6. Spastic bladder   Will order ditropan 5mg   7. Constipation   Will order Senna-S 1 tab BID   Meds reviewed Referrals reviewed - pt needs to make follow up appointment with  PCP All questions answered

## 2015-11-06 ENCOUNTER — Telehealth: Payer: Self-pay | Admitting: Physical Medicine & Rehabilitation

## 2015-11-06 NOTE — Telephone Encounter (Signed)
Katlyn RN with [facility] needed to let Dr. Wynn BankerKirsteins know that patient has fallen 5 times since her last Wednesday's visit.  Any questions please call her at [phone].

## 2015-11-06 NOTE — Telephone Encounter (Signed)
I saw her in clinic yesterday with her niece and had a long discussion with pt and niece.  Both are aware that pt tries to due things she should not do independently.  Counseled the pt on need for assistance and risk of falls.  It did not appear to be related to medications.  One issue was urinary urgency, for which meds were prescribed.  Patient reluctantly verbalized agreement.  Thank you.

## 2015-11-09 ENCOUNTER — Telehealth: Payer: Self-pay | Admitting: *Deleted

## 2015-11-09 NOTE — Telephone Encounter (Signed)
Prior Authorization.

## 2015-11-09 NOTE — Telephone Encounter (Signed)
Prior authorization for Oxybutynin is required. Patient is on medicaid # [id number]. Preferred medication is Vesicare, Topiaz, oxybutynin syrup/tablet (non-extended release). Oxybutynin ER is on the non-preferred list.  I have submitted a prior authorization for Oxybutynin ER.....FYI

## 2015-11-09 NOTE — Telephone Encounter (Signed)
Thank you.  If it gets denied we can try another medication.  Thanks.

## 2015-11-12 ENCOUNTER — Telehealth: Payer: Self-pay | Admitting: Physical Medicine & Rehabilitation

## 2015-11-12 NOTE — Telephone Encounter (Signed)
Ok to order 

## 2015-11-12 NOTE — Telephone Encounter (Signed)
Katlyn RN with [facility] would like 1 or 2 visits to monitor blood pressure, it ws 166/86.  She would also like to order a ST referral, patient feels like food gets stuck.  Please call her at [phone].

## 2015-11-13 NOTE — Telephone Encounter (Signed)
Ok to order 

## 2015-11-13 NOTE — Telephone Encounter (Signed)
Notified [name] that her order request is approved.

## 2015-12-01 ENCOUNTER — Telehealth: Payer: Self-pay | Admitting: Physical Medicine & Rehabilitation

## 2015-12-01 NOTE — Telephone Encounter (Signed)
Frankie PT with [facility] needs to extend patient to 2w3, please call her with verbal okay at [phone].

## 2015-12-01 NOTE — Telephone Encounter (Signed)
Spoke with Tomma LightningFrankie, approved verbal orders per office protocol.

## 2015-12-02 ENCOUNTER — Other Ambulatory Visit: Payer: Self-pay | Admitting: *Deleted

## 2015-12-02 MED ORDER — ROPINIROLE HCL 0.25 MG PO TABS: 0.2500 mg | ORAL_TABLET | Freq: Three times a day (TID) | ORAL | 0 refills | Status: AC

## 2015-12-02 MED ORDER — ROPINIROLE HCL 0.25 MG PO TABS: 0.2500 mg | ORAL_TABLET | Freq: Three times a day (TID) | ORAL | 0 refills | Status: DC

## 2015-12-03 ENCOUNTER — Ambulatory Visit: Payer: Medicaid Other | Admitting: Physical Medicine & Rehabilitation

## 2015-12-04 ENCOUNTER — Ambulatory Visit: Payer: Medicaid Other | Admitting: Physical Medicine & Rehabilitation

## 2015-12-07 ENCOUNTER — Telehealth: Payer: Self-pay | Admitting: Physical Medicine & Rehabilitation

## 2015-12-07 NOTE — Telephone Encounter (Signed)
Alexa CorinSusan Davis OT with [facility] needs to request additional visits for patient.  It is okay to leave verbal on her cell phone [phone].

## 2015-12-07 NOTE — Telephone Encounter (Signed)
Verbal orders given  

## 2015-12-10 ENCOUNTER — Other Ambulatory Visit: Payer: Self-pay | Admitting: Physical Medicine & Rehabilitation

## 2015-12-10 ENCOUNTER — Encounter: Payer: Self-pay | Admitting: Physical Medicine & Rehabilitation

## 2015-12-10 ENCOUNTER — Ambulatory Visit (HOSPITAL_BASED_OUTPATIENT_CLINIC_OR_DEPARTMENT_OTHER): Payer: Medicaid Other | Admitting: Physical Medicine & Rehabilitation

## 2015-12-10 ENCOUNTER — Encounter: Payer: Medicaid Other | Attending: Physical Medicine & Rehabilitation

## 2015-12-10 VITALS — BP 147/78 | HR 103 | Temp 102.1°F | Resp 14

## 2015-12-10 DIAGNOSIS — F4310 Post-traumatic stress disorder, unspecified: Secondary | ICD-10-CM | POA: Diagnosis not present

## 2015-12-10 DIAGNOSIS — I6350 Cerebral infarction due to unspecified occlusion or stenosis of unspecified cerebral artery: Secondary | ICD-10-CM | POA: Diagnosis not present

## 2015-12-10 DIAGNOSIS — I638 Other cerebral infarction: Secondary | ICD-10-CM | POA: Diagnosis not present

## 2015-12-10 DIAGNOSIS — M545 Low back pain: Secondary | ICD-10-CM | POA: Insufficient documentation

## 2015-12-10 DIAGNOSIS — F1010 Alcohol abuse, uncomplicated: Secondary | ICD-10-CM | POA: Diagnosis not present

## 2015-12-10 DIAGNOSIS — F329 Major depressive disorder, single episode, unspecified: Secondary | ICD-10-CM | POA: Insufficient documentation

## 2015-12-10 DIAGNOSIS — I69352 Hemiplegia and hemiparesis following cerebral infarction affecting left dominant side: Secondary | ICD-10-CM | POA: Diagnosis present

## 2015-12-10 DIAGNOSIS — R269 Unspecified abnormalities of gait and mobility: Secondary | ICD-10-CM | POA: Insufficient documentation

## 2015-12-10 DIAGNOSIS — Z87440 Personal history of urinary (tract) infections: Secondary | ICD-10-CM | POA: Diagnosis not present

## 2015-12-10 DIAGNOSIS — N3289 Other specified disorders of bladder: Secondary | ICD-10-CM | POA: Diagnosis not present

## 2015-12-10 DIAGNOSIS — G8112 Spastic hemiplegia affecting left dominant side: Secondary | ICD-10-CM

## 2015-12-10 DIAGNOSIS — G8929 Other chronic pain: Secondary | ICD-10-CM | POA: Diagnosis present

## 2015-12-10 DIAGNOSIS — Z5189 Encounter for other specified aftercare: Secondary | ICD-10-CM | POA: Insufficient documentation

## 2015-12-10 DIAGNOSIS — Z720 Tobacco use: Secondary | ICD-10-CM | POA: Diagnosis not present

## 2015-12-10 DIAGNOSIS — K859 Acute pancreatitis without necrosis or infection, unspecified: Secondary | ICD-10-CM | POA: Insufficient documentation

## 2015-12-10 DIAGNOSIS — K760 Fatty (change of) liver, not elsewhere classified: Secondary | ICD-10-CM | POA: Insufficient documentation

## 2015-12-10 DIAGNOSIS — R509 Fever, unspecified: Secondary | ICD-10-CM | POA: Diagnosis not present

## 2015-12-10 DIAGNOSIS — K5900 Constipation, unspecified: Secondary | ICD-10-CM | POA: Diagnosis not present

## 2015-12-10 NOTE — Patient Instructions (Addendum)
.   I have ordered a urine culture as well as a complete count. If there are any abnormalities we'll contact you. Will also asked them to send a copy to Dr. Mikeal HawthorneGarba  Patient will have Botox injection as well. Next visit

## 2015-12-10 NOTE — Progress Notes (Signed)
Subjective:    Patient ID: [name], female    DOB: Jun 13, 1955, 60 y.o.   MRN: [id number]  HPI 60 year old female returns today with chief complaint of sweats and chills.  She states that she is frequently urinating but it does not burn. She notices a fishy smell to her urine over the last day or 2. She has no cough. She has some mild abdominal pain but no change in her bowel habits. She has had no contact with her PCP regarding this issue.  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 She is receiving some home health services at the current time. She is having some problems with her left AFO, causing some pressure areas over the bottom of her foot. She has had no falls.  Pain Inventory Average Pain 8 Pain Right Now 7 My pain is aching  In the last 24 hours, has pain interfered with the following? General activity 2 Relation with others 2 Enjoyment of life no selection What TIME of day is your pain at its worst? morning Sleep (in general) Fair  Pain is worse with: bending Pain improves with: medication Relief from Meds: 10  Mobility walk with assistance use a walker Do you have any goals in this area?  yes  Function disabled: date disabled . I need assistance with the following:  bathing, meal prep, household duties and shopping  Neuro/Psych bladder control problems trouble walking  Prior Studies Any changes since last visit?  no  Physicians involved in your care Any changes since last visit?  no   Family History  Problem Relation Age of Onset  . Stroke Mother   . Cancer Sister     type unknown  . Diabetes Father    Social History   Social History  . Marital status: Widowed    Spouse name: N/A  . Number of children: 0  . Years of education: N/A   Social History Main Topics  . Smoking status: Former Smoker    Packs/day: 0.50    Types: Cigarettes    Quit date:  09/26/2015  . Smokeless tobacco: Former Neurosurgeon  . Alcohol use No  . Drug use: No  . Sexual activity: Not Asked   Other Topics Concern  . None   Social History Narrative  . None   Past Surgical History:  Procedure Laterality Date  . ABDOMINAL HYSTERECTOMY    . APPENDECTOMY    . COLONOSCOPY WITH PROPOFOL N/A 08/13/2015   Procedure: COLONOSCOPY WITH PROPOFOL;  Surgeon: Rachael Fee, MD;  Location: [facility] ENDOSCOPY;  Service: Endoscopy;  Laterality: N/A;  . ESOPHAGOGASTRODUODENOSCOPY N/A 05/23/2014   Procedure: ESOPHAGOGASTRODUODENOSCOPY (EGD);  Surgeon: Beverley Fiedler, MD;  Location: [facility] ENDOSCOPY;  Service: Endoscopy;  Laterality: N/A;  . ESOPHAGOGASTRODUODENOSCOPY (EGD) WITH PROPOFOL N/A 08/13/2015   Procedure: ESOPHAGOGASTRODUODENOSCOPY (EGD) WITH PROPOFOL;  Surgeon: Rachael Fee, MD;  Location: [facility] ENDOSCOPY;  Service: Endoscopy;  Laterality: N/A;  . ESOPHAGOGASTRODUODENOSCOPY ENDOSCOPY    . gall stone removed     Past Medical History:  Diagnosis Date  . Alcohol abuse   . Chronic back pain   . Colitis   . CVA (cerebral infarction)   . Fatty liver   . Gastric ulcer   . GI bleed   . Hepatitis    Hepatitis C  . MDD (major depressive disorder) (HCC)   . Pancreatitis   . PTSD (post-traumatic stress disorder)   . Spastic hemiplegia  affecting left dominant side (HCC) 11/05/2015  . Stroke (HCC)   . UTI (lower urinary tract infection)    BP (!) 147/78 (BP Location: Right Arm, Patient Position: Sitting, Cuff Size: Normal)   Pulse (!) 103   Temp (!) 102.1 F (38.9 C) (Oral)   Resp 14   SpO2 (!) 88%   Opioid Risk Score:   Fall Risk Score:  `1  Depression screen PHQ 2/9  Depression screen PHQ 2/9 11/05/2015  Decreased Interest 0  Down, Depressed, Hopeless 0  PHQ - 2 Score 0  Altered sleeping 0  Tired, decreased energy 2  Change in appetite 0  Feeling bad or failure about yourself  0  Trouble concentrating 0  Moving slowly or fidgety/restless 0  Suicidal thoughts 0  PHQ-9  Score 2  Difficult doing work/chores Not difficult at all    Review of Systems  Constitutional: Positive for chills, diaphoresis and fever.  Eyes: Negative.   Respiratory: Positive for cough.   Cardiovascular: Negative.   Gastrointestinal: Positive for abdominal distention.  Endocrine: Negative.   Genitourinary: Negative.        Incontinence  Musculoskeletal: Positive for gait problem.  Allergic/Immunologic: Negative.   Neurological: Positive for headaches.  Hematological: Negative.   Psychiatric/Behavioral: Negative.        Objective:   Physical Exam  Constitutional: She is oriented to person, place, and time. She appears well-developed and well-nourished.  HENT:  Head: Normocephalic and atraumatic.  Eyes: Conjunctivae and EOM are normal. Pupils are equal, round, and reactive to light.  Neck: Normal range of motion.  Cardiovascular: Normal rate, regular rhythm and normal heart sounds.   Pulmonary/Chest: Effort normal and breath sounds normal. No respiratory distress.  Abdominal: Soft. Bowel sounds are normal. She exhibits no distension. There is no tenderness.  Musculoskeletal:       Left foot: There is no swelling and no laceration.  Mild erythema at the base of the first M T P, some callus formation. There as well as over the fifth MTP, tenderness to palpation. No drainage  Neurological: She is alert and oriented to person, place, and time. Gait abnormal.  Motor strength is 3 plus at the left deltoid, biceps, triceps, grip, hip flexor, knee extensor, 2 minus, ankle dorsiflexor, plantar flexor Normal sensation  Skin: Skin is warm and dry.  Psychiatric: She has a normal mood and affect.  Nursing note and vitals reviewed.         Assessment & Plan:  1. Left spastic hemiplegia has increasing spasticity, left foot inverters as well as toe flexors. This is interfering with gait pattern. We'll schedule for botulinum toxin injection, , 25 units, soleus, 75 units, tibialis  posterior, 75 units, left flexor digitorum longus  We'll plan to do this in 2 weeks  2. Fever with foul-smelling urine, suspect UTI, UA performed a 01/01/2016 was negative. We will order another one since. The patient is here. I also instructed her to call her primary MD. She was also sent for CBC.

## 2015-12-11 LAB — CBC WITH DIFFERENTIAL/PLATELET
BASOS: 0 %
Basophils Absolute: 0 10*3/uL (ref 0.0–0.2)
EOS (ABSOLUTE): 0.1 10*3/uL (ref 0.0–0.4)
EOS: 1 %
HEMATOCRIT: 42.6 % (ref 34.0–46.6)
Hemoglobin: 13.9 g/dL (ref 11.1–15.9)
IMMATURE GRANULOCYTES: 0 %
Immature Grans (Abs): 0 10*3/uL (ref 0.0–0.1)
LYMPHS ABS: 1.6 10*3/uL (ref 0.7–3.1)
Lymphs: 19 %
MCH: 31 pg (ref 26.6–33.0)
MCHC: 32.6 g/dL (ref 31.5–35.7)
MCV: 95 fL (ref 79–97)
MONOS ABS: 0.7 10*3/uL (ref 0.1–0.9)
Monocytes: 9 %
NEUTROS PCT: 71 %
Neutrophils Absolute: 6 10*3/uL (ref 1.4–7.0)
PLATELETS: 181 10*3/uL (ref 150–379)
RBC: 4.49 x10E6/uL (ref 3.77–5.28)
RDW: 13.8 % (ref 12.3–15.4)
WBC: 8.4 10*3/uL (ref 3.4–10.8)

## 2015-12-11 LAB — URINALYSIS, ROUTINE W REFLEX MICROSCOPIC
Bilirubin, UA: NEGATIVE
GLUCOSE, UA: NEGATIVE
Ketones, UA: NEGATIVE
LEUKOCYTES UA: NEGATIVE
Nitrite, UA: NEGATIVE
PH UA: 6 (ref 5.0–7.5)
RBC, UA: NEGATIVE
Specific Gravity, UA: 1.023 (ref 1.005–1.030)
Urobilinogen, Ur: 0.2 mg/dL (ref 0.2–1.0)

## 2015-12-11 LAB — MICROSCOPIC EXAMINATION
BACTERIA UA: NONE SEEN
CASTS: NONE SEEN /LPF

## 2015-12-11 LAB — URINE CULTURE

## 2015-12-12 ENCOUNTER — Emergency Department (HOSPITAL_COMMUNITY): Payer: Medicaid Other

## 2015-12-12 ENCOUNTER — Emergency Department (HOSPITAL_COMMUNITY)
Admission: EM | Admit: 2015-12-12 | Discharge: 2015-12-12 | Disposition: A | Discharge status: Home / Self Care | Payer: Medicaid Other | Attending: Emergency Medicine | Admitting: Emergency Medicine

## 2015-12-12 ENCOUNTER — Encounter (HOSPITAL_COMMUNITY): Payer: Self-pay | Admitting: Emergency Medicine

## 2015-12-12 DIAGNOSIS — Z87891 Personal history of nicotine dependence: Secondary | ICD-10-CM | POA: Insufficient documentation

## 2015-12-12 DIAGNOSIS — J45909 Unspecified asthma, uncomplicated: Secondary | ICD-10-CM | POA: Insufficient documentation

## 2015-12-12 DIAGNOSIS — J189 Pneumonia, unspecified organism: Secondary | ICD-10-CM | POA: Diagnosis not present

## 2015-12-12 DIAGNOSIS — Z8673 Personal history of transient ischemic attack (TIA), and cerebral infarction without residual deficits: Secondary | ICD-10-CM | POA: Diagnosis not present

## 2015-12-12 DIAGNOSIS — R509 Fever, unspecified: Secondary | ICD-10-CM | POA: Diagnosis present

## 2015-12-12 LAB — COMPREHENSIVE METABOLIC PANEL
ALBUMIN: 3.3 g/dL — AB (ref 3.5–5.0)
ALT: 23 U/L (ref 14–54)
AST: 28 U/L (ref 15–41)
Alkaline Phosphatase: 74 U/L (ref 38–126)
Anion gap: 8 (ref 5–15)
BUN: 13 mg/dL (ref 6–20)
CHLORIDE: 112 mmol/L — AB (ref 101–111)
CO2: 18 mmol/L — AB (ref 22–32)
CREATININE: 1.04 mg/dL — AB (ref 0.44–1.00)
Calcium: 8.4 mg/dL — ABNORMAL LOW (ref 8.9–10.3)
GFR calc Af Amer: >60 mL/min (ref >60)
GFR calc non Af Amer: 56 mL/min — ABNORMAL LOW (ref >60)
Glucose, Bld: 87 mg/dL (ref 65–99)
Potassium: 3.5 mmol/L (ref 3.5–5.1)
SODIUM: 138 mmol/L (ref 135–145)
Total Bilirubin: 0.5 mg/dL (ref 0.3–1.2)
Total Protein: 6.5 g/dL (ref 6.5–8.1)

## 2015-12-12 LAB — CBC WITH DIFFERENTIAL/PLATELET
Basophils Absolute: 0 10*3/uL (ref 0.0–0.1)
Basophils Relative: 0 %
EOS ABS: 0.1 10*3/uL (ref 0.0–0.7)
EOS PCT: 1 %
HCT: 39.9 % (ref 36.0–46.0)
HEMOGLOBIN: 13 g/dL (ref 12.0–15.0)
LYMPHS ABS: 1 10*3/uL (ref 0.7–4.0)
LYMPHS PCT: 14 %
MCH: 30 pg (ref 26.0–34.0)
MCHC: 32.6 g/dL (ref 30.0–36.0)
MCV: 91.9 fL (ref 78.0–100.0)
MONOS PCT: 6 %
Monocytes Absolute: 0.4 10*3/uL (ref 0.1–1.0)
Neutro Abs: 5.7 10*3/uL (ref 1.7–7.7)
Neutrophils Relative %: 79 %
PLATELETS: 169 10*3/uL (ref 150–400)
RBC: 4.34 MIL/uL (ref 3.87–5.11)
RDW: 13.5 % (ref 11.5–15.5)
WBC: 7.2 10*3/uL (ref 4.0–10.5)

## 2015-12-12 LAB — URINALYSIS, ROUTINE W REFLEX MICROSCOPIC
Bilirubin Urine: NEGATIVE
Glucose, UA: NEGATIVE mg/dL
Hgb urine dipstick: NEGATIVE
Ketones, ur: NEGATIVE mg/dL
LEUKOCYTES UA: NEGATIVE
Nitrite: NEGATIVE
PROTEIN: NEGATIVE mg/dL
Specific Gravity, Urine: 1.026 (ref 1.005–1.030)
pH: 6.5 (ref 5.0–8.0)

## 2015-12-12 LAB — I-STAT BETA HCG BLOOD, ED (MC, WL, AP ONLY)

## 2015-12-12 LAB — I-STAT CG4 LACTIC ACID, ED
Lactic Acid, Venous: 1.51 mmol/L (ref 0.5–1.9)
Lactic Acid, Venous: 0.76 mmol/L (ref 0.5–1.9)

## 2015-12-12 MED ORDER — IBUPROFEN 200 MG PO TABS
600.0000 mg | ORAL_TABLET | Freq: Once | ORAL | Status: AC
  Administered 2015-12-12: 600 mg via ORAL
  Filled 2015-12-12: qty 1

## 2015-12-12 MED ORDER — SODIUM CHLORIDE 0.9 % IV BOLUS (SEPSIS)
1000.0000 mL | Freq: Once | INTRAVENOUS | Status: AC
  Administered 2015-12-12: 1000 mL via INTRAVENOUS

## 2015-12-12 MED ORDER — AZITHROMYCIN 250 MG PO TABS: 250.0000 mg | ORAL_TABLET | Freq: Every day | ORAL | 0 refills | Status: DC

## 2015-12-12 MED ORDER — CEFDINIR 300 MG PO CAPS: 300.0000 mg | ORAL_CAPSULE | Freq: Two times a day (BID) | ORAL | 0 refills | Status: AC

## 2015-12-12 MED ORDER — ACETAMINOPHEN 325 MG PO TABS
ORAL_TABLET | ORAL | Status: AC
  Administered 2015-12-12: 650 mg
  Filled 2015-12-12: qty 2

## 2015-12-12 MED ORDER — AZITHROMYCIN 250 MG PO TABS
500.0000 mg | ORAL_TABLET | Freq: Once | ORAL | Status: AC
  Administered 2015-12-12: 500 mg via ORAL
  Filled 2015-12-12: qty 2

## 2015-12-12 NOTE — ED Notes (Signed)
Dr Tegeler aware pt's niece has returned and continues to state she does not wish to take pt home. Niece is telling pt how much of a burden she is on her.

## 2015-12-12 NOTE — ED Notes (Signed)
Niece left to go move her vehicle. Dr Particia NearingHaviland came to speak w/her and advised RN to advise her she is leaving at this time.

## 2015-12-12 NOTE — ED Provider Notes (Signed)
Pt signed out by Dr. Madilyn Hookees.  We were awaiting CT scan results.  The Ct renal study did not show any acute inflammatory process in her abdomen, but did show a possible LLL pneumonia which was not seen on CXR.  Due to the fever and lack of any other source, I will start her on CAP tx.  Pt was ambulated and O2 sats stayed above 91%.  She knows to return if worse.   Jacalyn LefevreJulie Faith Patricelli, MD 12/13/15 [phone]

## 2015-12-12 NOTE — ED Notes (Signed)
Patient transported to XR. 

## 2015-12-12 NOTE — ED Notes (Signed)
Niece is advising Dr Rush Landmarkegeler she is not able to take pt home tonight d/t it is too much on her. States no home health comes into the home on week-ends. States if she has to take her home this evening, "I will call keep calling EMS to bring her back".

## 2015-12-12 NOTE — ED Provider Notes (Signed)
[facility]-EMERGENCY DEPT Provider Note   CSN: [id number] Arrival date & time: 12/12/15  0503     History   Chief Complaint Chief Complaint  Patient presents with  . Fever  . Fall    HPI [name] is a 60 y.o. female.  The history is provided by the patient. No language interpreter was used.  Fever    Fall    [name] is a 60 y.o. female who presents to the Emergency Department complaining of fever.  She presents for evaluation of fever starting yesterday. She states she saw her PCP 4 days ago and was diagnosed with urinary tract infection and started on Bactrim. The last 2 days fever. She endorses shortness of breath with some central chest discomfort as well as low back pain, lower abdominal pain. No vomiting, cough, dysuria. She has a hx/o CVA with left lower extremity weakness.  Past Medical History:  Diagnosis Date  . Alcohol abuse   . Chronic back pain   . Colitis   . CVA (cerebral infarction)   . Fatty liver   . Gastric ulcer   . GI bleed   . Hepatitis    Hepatitis C  . MDD (major depressive disorder) (HCC)   . Pancreatitis   . PTSD (post-traumatic stress disorder)   . 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 Stroke (HCC)   . UTI (lower urinary tract infection)     Patient Active Problem List   Diagnosis Date Noted  . 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 Left hip pain   . History of GI bleed   . Spastic bladder   . Chronic back pain   . ETOH abuse   . Hypokalemia   . PTSD (post-traumatic stress disorder)   . Gastroesophageal reflux disease without esophagitis   . Acute ischemic stroke (HCC) 09/27/2015  . Acute CVA (cerebrovascular accident) (HCC) 09/27/2015  . Tobacco abuse   . Left-sided weakness 09/26/2015  . Acute gastric ulcer   . GI bleed 05/22/2014  . Acute GI bleeding 05/22/2014  . Acute blood loss anemia 05/22/2014  . History of CVA (cerebrovascular accident) 05/22/2014  .  Hyperlipidemia 05/22/2014  . Gastrointestinal hemorrhage with melena   . CVA (cerebral infarction) 10/09/2012  . Flank pain 10/09/2012  . Right pontine stroke (HCC) 10/09/2012  . ABDOMINAL PAIN OTHER SPECIFIED SITE 05/17/2010  . ALCOHOL ABUSE 06/16/2007  . COCAINE ABUSE 06/16/2007  . DEPRESSION 06/16/2007  . Essential hypertension 06/16/2007  . CEREBROVASCULAR ACCIDENT 06/16/2007  . ASTHMA 06/16/2007  . GERD 06/16/2007  . PANCREATITIS 11/09/2006  . ESOPHAGITIS, REFLUX 05/23/2006    Past Surgical History:  Procedure Laterality Date  . ABDOMINAL HYSTERECTOMY    . APPENDECTOMY    . COLONOSCOPY WITH PROPOFOL N/A 08/13/2015   Procedure: COLONOSCOPY WITH PROPOFOL;  Surgeon: Rachael Feeaniel P Jacobs, MD;  Location: [facility] ENDOSCOPY;  Service: Endoscopy;  Laterality: N/A;  . ESOPHAGOGASTRODUODENOSCOPY N/A 05/23/2014   Procedure: ESOPHAGOGASTRODUODENOSCOPY (EGD);  Surgeon: Beverley FiedlerJay M Pyrtle, MD;  [facility];  Service: Endoscopy;  Laterality: N/A;  . ESOPHAGOGASTRODUODENOSCOPY (EGD) WITH PROPOFOL N/A 08/13/2015   Procedure: ESOPHAGOGASTRODUODENOSCOPY (EGD) WITH PROPOFOL;  Surgeon: Rachael Feeaniel P Jacobs, MD;  Location: [facility] ENDOSCOPY;  Service: Endoscopy;  Laterality: N/A;  . ESOPHAGOGASTRODUODENOSCOPY ENDOSCOPY    . gall stone removed      OB History    No data available       Home Medications    Prior to Admission medications   Medication  Sig Start Date End Date Taking? Authorizing Provider  albuterol (PROVENTIL HFA;VENTOLIN HFA) 108 (90 BASE) MCG/ACT inhaler Inhale 2 puffs into the lungs every 8 (eight) hours as needed for wheezing or shortness of breath.    Historical Provider, MD  atorvastatin (LIPITOR) 80 MG tablet Take 1 tablet (80 mg total) by mouth daily at 6 PM. 10/26/15   Mcarthur Rossetti Angiulli, PA-C  clopidogrel (PLAVIX) 75 MG tablet Take 1 tablet (75 mg total) by mouth daily. 10/26/15   Mcarthur Rossetti Angiulli, PA-C  cycloSPORINE (RESTASIS) 0.05 % ophthalmic emulsion Place 1 drop into both eyes 2 (two)  times daily. 10/26/15   Mcarthur Rossetti Angiulli, PA-C  famotidine (PEPCID) 20 MG tablet Take 1 tablet (20 mg total) by mouth 2 (two) times daily. 10/26/15   Mcarthur Rossetti Angiulli, PA-C  fluticasone (FLONASE) 50 MCG/ACT nasal spray Place 1 spray into the nose daily as needed for allergies.  06/04/15   Historical Provider, MD  gabapentin (NEURONTIN) 300 MG capsule Take 1 capsule (300 mg total) by mouth 3 (three) times daily. 10/26/15   Mcarthur Rossetti Angiulli, PA-C  oxybutynin (DITROPAN-XL) 5 MG 24 hr tablet TAKE 1 TABLET BY MOUTH AT BEDTIME. 11/06/15   Ankit Karis Juba, MD  oxyCODONE-acetaminophen (PERCOCET) 7.5-325 MG tablet Take 1 tablet by mouth 3 (three) times daily. 10/26/15   Mcarthur Rossetti Angiulli, PA-C  pantoprazole (PROTONIX) 40 MG tablet Take 1 tablet (40 mg total) by mouth 2 (two) times daily before a meal. 10/26/15   Mcarthur Rossetti Angiulli, PA-C  rOPINIRole (REQUIP) 0.25 MG tablet Take 1 tablet (0.25 mg total) by mouth 3 (three) times daily. 12/02/15   Erick Colace, MD  senna-docusate (SENOKOT-S) 8.6-50 MG tablet Take 1 tablet by mouth 2 (two) times daily. 11/05/15   Ankit Karis Juba, MD  sertraline (ZOLOFT) 50 MG tablet Take 1 tablet (50 mg total) by mouth daily. 10/26/15   Mcarthur Rossetti Angiulli, PA-C  temazepam (RESTORIL) 15 MG capsule Take 15 mg by mouth at bedtime.    Historical Provider, MD  traMADol-acetaminophen (ULTRACET) 37.5-325 MG tablet Take 1 tablet by mouth every 4 (four) hours as needed for moderate pain. 10/26/15   Mcarthur Rossetti Angiulli, PA-C  Travoprost, BAK Free, (TRAVATAN) 0.004 % SOLN ophthalmic solution Place 1 drop into both eyes at bedtime.     Historical Provider, MD    Family History Family History  Problem Relation Age of Onset  . Stroke Mother   . Cancer Sister     type unknown  . Diabetes Father     Social History Social History  Substance Use Topics  . Smoking status: Former Smoker    Packs/day: 0.50    Types: Cigarettes    Quit date: 09/26/2015  . Smokeless tobacco: Former Neurosurgeon  .  Alcohol use No     Allergies   Banana; Penicillins; and Simbrinza [brinzolamide-brimonidine]   Review of Systems Review of Systems  Constitutional: Positive for fever.  All other systems reviewed and are negative.    Physical Exam Updated Vital Signs BP 145/85   Pulse 98   Temp (!) 104.2 F (40.1 C) (Rectal)   Resp (!) 27   Ht 5\' 4"  (1.626 m)   Wt 137 lb (62.1 kg)   SpO2 92%   BMI 23.52 kg/m   Physical Exam  Constitutional: She is oriented to person, place, and time. She appears well-developed and well-nourished.  HENT:  Head: Normocephalic and atraumatic.  Cardiovascular: Normal rate and regular rhythm.   No murmur  heard. Pulmonary/Chest: Effort normal and breath sounds normal. No respiratory distress.  Abdominal: Soft. There is no rebound and no guarding.  Mild diffuse abdominal tenderness  Musculoskeletal: She exhibits no edema or tenderness.  Neurological: She is alert and oriented to person, place, and time.  LLE weakness  Skin: Skin is warm and dry.  Psychiatric: She has a normal mood and affect. Her behavior is normal.  Nursing note and vitals reviewed.    ED Treatments / Results  Labs (all labs ordered are listed, but only abnormal results are displayed) Labs Reviewed  CULTURE, BLOOD (ROUTINE X 2)  CULTURE, BLOOD (ROUTINE X 2)  URINE CULTURE  CBC WITH DIFFERENTIAL/PLATELET  URINALYSIS, ROUTINE W REFLEX MICROSCOPIC (NOT AT [facility])  COMPREHENSIVE METABOLIC PANEL  I-STAT BETA HCG BLOOD, ED ([facility], [facility], [facility] ONLY)  I-STAT CG4 LACTIC ACID, ED  I-STAT CG4 LACTIC ACID, ED    EKG  EKG Interpretation  Date/Time:  Saturday December 12 2015 05:46:31 EDT Ventricular Rate:  87 PR Interval:    QRS Duration: 71 QT Interval:  330 QTC Calculation: 397 R Axis:   59 Text Interpretation:  Age not entered, assumed to be  60 years old for purpose of ECG interpretation Sinus rhythm Probable left atrial enlargement Confirmed by Lincoln Brigham [phone]) on 12/12/2015 5:54:02  AM       Radiology Dg Chest 2 View  Result Date: 12/12/2015 CLINICAL DATA:  60 y/o  F; sepsis. EXAM: CHEST  2 VIEW COMPARISON:  09/26/2015 chest radiograph. FINDINGS: The heart size and mediastinal contours are within normal limits and stable. Both lungs are clear. The visualized skeletal structures are unremarkable. IMPRESSION: No active cardiopulmonary disease. Electronically Signed   By: Mitzi Hansen M.D.   On: 12/12/2015 06:20    Procedures Procedures (including critical care time)  Medications Ordered in ED Medications  acetaminophen (TYLENOL) 325 MG tablet (650 mg  Given 12/12/15 0539)  sodium chloride 0.9 % bolus 1,000 mL (1,000 mLs Intravenous New Bag/Given 12/12/15 0636)     Initial Impression / Assessment and Plan / ED Course  I have reviewed the triage vital signs and the nursing notes.  Pertinent labs & imaging results that were available during my care of the patient were reviewed by me and considered in my medical decision making (see chart for details).  Clinical Course   Patient here for evaluation of fever, recently treated for urinary tract infection and is on Bactrim. UA today is not consistent with infection. Plan to obtain CT stone study to rule out stone contributing to back pain/abdominal pain.  Pt care transferred pending CMP, CT.    Final Clinical Impressions(s) / ED Diagnoses   Final diagnoses:  None    New Prescriptions New Prescriptions   No medications on file     Tilden Fossa, MD 12/12/15 [phone]

## 2015-12-12 NOTE — ED Provider Notes (Signed)
5:30 PM  Nursing called to report questions and concerns of family.   Patients niece was at the bedside and expressed concern for the burden that patient would place on her for her care. Nursing was at the bedside during this conversation.  Lengthy conversation was held with family and family understood that patient was not appropriate for or requiring admission at this time. Family requests patient be placed in short-term rehab facility tonight, however, they were informed this is not something to be arranged in ED. Suggested patient have this discussion with PCP and family.  Family understood difficulty of situation and understood the patient should have improving symptoms now that new pneumonia is being treated. Family was given return precautions for new or worsening symptoms. Family understood plan to follow up with PCP and discuss level of care needs for patient.  Patient had no questions or concerns and patient was discharged in the care of her family without further complication.   Canary Brimhristopher J Tegeler, MD 12/13/15 [phone]

## 2015-12-12 NOTE — ED Notes (Addendum)
Pt's niece at the bedside refusing to take pt. Home stating that pt is not able to walk independently and that "my life stops" when she has to care for her Aunt.  Pt ambulated with one assist to bathroom with minimal unsteadiness pt's niece refusing to take her home stating that she is too unsteady to ambulate.  Niece wanting to speak with ER MD about discharge, MD made aware will come to speak with pt.

## 2015-12-12 NOTE — ED Notes (Signed)
Pt eating lunch

## 2015-12-12 NOTE — ED Notes (Signed)
Pt used bedpan 

## 2015-12-12 NOTE — ED Notes (Signed)
Dr Rush Landmarkegeler in w/pt and niece.

## 2015-12-12 NOTE — ED Notes (Signed)
Contacted patient's Niece, Ms. Roxan HockeyRobinson, to notify of impending discharge.  Reviewed discharge instructions and paperwork with her and with the patient.  Pt awaiting family member for discharge.

## 2015-12-12 NOTE — ED Triage Notes (Signed)
Pt arrives via EMS from home, per report DXed with UTI yesterday(started bactrim yesterday) , states fever last night last motrin 6PM. States hx stroke with L sided deficits. L ankle pain from unwitnessed fall.

## 2015-12-12 NOTE — Discharge Instructions (Signed)
Tylenol and Ibuprofen prn fever.

## 2015-12-12 NOTE — ED Notes (Signed)
Dr Particia NearingHaviland aware niece is refusing to take pt home. States she is unable to get pt up the 2 stairs to get into the house. Pt ambulated w/assistance from RN x 1. Pt had on her leg brace but no walker. Niece states pt has walker at home and advised pt "you don't need it here".

## 2015-12-12 NOTE — ED Notes (Signed)
Ginger Ale given to pt as requested - aware lunch tray has been ordered.

## 2015-12-12 NOTE — ED Notes (Signed)
Pt's niece, [name], Called for update.  This was provided in the treatment room with the patient's permission and with the patient present.

## 2015-12-12 NOTE — ED Notes (Signed)
Pt arrived to Pod C 23 via stretcher, alert.

## 2015-12-12 NOTE — ED Notes (Signed)
Ambulated pt. in hall w/ 1 stand by assist and walker; pt tolerated well; O2 saturation did not drop below 91%

## 2015-12-13 LAB — URINE CULTURE

## 2015-12-17 LAB — CULTURE, BLOOD (ROUTINE X 2)
Culture: NO GROWTH
Culture: NO GROWTH

## 2015-12-24 ENCOUNTER — Encounter: Payer: Medicaid Other | Attending: Physical Medicine & Rehabilitation

## 2015-12-24 ENCOUNTER — Ambulatory Visit (HOSPITAL_BASED_OUTPATIENT_CLINIC_OR_DEPARTMENT_OTHER): Payer: Medicaid Other | Admitting: Physical Medicine & Rehabilitation

## 2015-12-24 ENCOUNTER — Encounter: Payer: Self-pay | Admitting: Physical Medicine & Rehabilitation

## 2015-12-24 VITALS — BP 150/80 | HR 68 | Resp 14

## 2015-12-24 DIAGNOSIS — Z5189 Encounter for other specified aftercare: Secondary | ICD-10-CM | POA: Diagnosis present

## 2015-12-24 DIAGNOSIS — I69352 Hemiplegia and hemiparesis following cerebral infarction affecting left dominant side: Secondary | ICD-10-CM | POA: Insufficient documentation

## 2015-12-24 DIAGNOSIS — K5900 Constipation, unspecified: Secondary | ICD-10-CM | POA: Diagnosis not present

## 2015-12-24 DIAGNOSIS — F329 Major depressive disorder, single episode, unspecified: Secondary | ICD-10-CM | POA: Diagnosis not present

## 2015-12-24 DIAGNOSIS — R269 Unspecified abnormalities of gait and mobility: Secondary | ICD-10-CM | POA: Insufficient documentation

## 2015-12-24 DIAGNOSIS — G8929 Other chronic pain: Secondary | ICD-10-CM | POA: Insufficient documentation

## 2015-12-24 DIAGNOSIS — K859 Acute pancreatitis without necrosis or infection, unspecified: Secondary | ICD-10-CM | POA: Insufficient documentation

## 2015-12-24 DIAGNOSIS — IMO0002 Reserved for concepts with insufficient information to code with codable children: Secondary | ICD-10-CM

## 2015-12-24 DIAGNOSIS — Z87440 Personal history of urinary (tract) infections: Secondary | ICD-10-CM | POA: Diagnosis not present

## 2015-12-24 DIAGNOSIS — F4310 Post-traumatic stress disorder, unspecified: Secondary | ICD-10-CM | POA: Diagnosis not present

## 2015-12-24 DIAGNOSIS — N3289 Other specified disorders of bladder: Secondary | ICD-10-CM | POA: Diagnosis not present

## 2015-12-24 DIAGNOSIS — F1010 Alcohol abuse, uncomplicated: Secondary | ICD-10-CM | POA: Insufficient documentation

## 2015-12-24 DIAGNOSIS — M545 Low back pain: Secondary | ICD-10-CM | POA: Insufficient documentation

## 2015-12-24 DIAGNOSIS — I638 Other cerebral infarction: Secondary | ICD-10-CM | POA: Diagnosis not present

## 2015-12-24 DIAGNOSIS — K760 Fatty (change of) liver, not elsewhere classified: Secondary | ICD-10-CM | POA: Insufficient documentation

## 2015-12-24 DIAGNOSIS — G8112 Spastic hemiplegia affecting left dominant side: Secondary | ICD-10-CM

## 2015-12-24 DIAGNOSIS — I639 Cerebral infarction, unspecified: Secondary | ICD-10-CM

## 2015-12-24 DIAGNOSIS — Z720 Tobacco use: Secondary | ICD-10-CM | POA: Diagnosis not present

## 2015-12-24 NOTE — Progress Notes (Signed)
Botox Injection for spasticity using needle EMG guidance  Dilution: 50 Units/ml Indication: Severe spasticity which interferes with ADL,mobility and/or  hygiene and is unresponsive to medication management and other conservative care Informed consent was obtained after describing risks and benefits of the procedure with the patient. This includes bleeding, bruising, infection, excessive weakness, or medication side effects. A REMS form is on file and signed. Needle: 27g 1" needle electrode Number of units per muscle FHL 75 FDL 75 MEDGASTROC 25 SOLEUS 25  All injections were done after obtaining appropriate EMG activity and after negative drawback for blood. The patient tolerated the procedure well. Post procedure instructions were given. A followup appointment was made.

## 2015-12-24 NOTE — Patient Instructions (Signed)

## 2016-01-11 ENCOUNTER — Encounter: Payer: Self-pay | Admitting: Neurology

## 2016-01-11 ENCOUNTER — Ambulatory Visit (INDEPENDENT_AMBULATORY_CARE_PROVIDER_SITE_OTHER): Payer: Medicaid Other | Admitting: Neurology

## 2016-01-11 VITALS — BP 104/67 | HR 70 | Ht 64.0 in | Wt 134.2 lb

## 2016-01-11 DIAGNOSIS — I63311 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 cerebral artery: Secondary | ICD-10-CM

## 2016-01-11 DIAGNOSIS — Z87891 Personal history of nicotine dependence: Secondary | ICD-10-CM | POA: Insufficient documentation

## 2016-01-11 DIAGNOSIS — E782 Mixed hyperlipidemia: Secondary | ICD-10-CM | POA: Diagnosis not present

## 2016-01-11 DIAGNOSIS — Z8719 Personal history of other diseases of the digestive system: Secondary | ICD-10-CM | POA: Diagnosis not present

## 2016-01-11 DIAGNOSIS — Z8673 Personal history of transient ischemic attack (TIA), and cerebral infarction without residual deficits: Secondary | ICD-10-CM

## 2016-01-11 NOTE — Patient Instructions (Signed)
-   continue plavix and lipitor for stroke prevention - continue abstain from smoking - Follow up with your primary care physician for stroke risk factor modification. Recommend maintain blood pressure goal <130/80, diabetes with hemoglobin A1c goal below 7.0% and lipids with LDL cholesterol goal below 70 mg/dL.  - check BP at home and record - continue outpt PT/OT and work hard with therapy - follow up in 3 months.

## 2016-01-11 NOTE — Progress Notes (Signed)
STROKE NEUROLOGY FOLLOW UP NOTE  NAME: [name] DOB: Oct 21, 1955  REASON FOR VISIT: stroke follow up HISTORY FROM: pt, daughter and chart  Today we had the pleasure of seeing [name] in follow-up at our Neurology Clinic. Pt was accompanied by daughter.   History Summary [name] is a 60 y.o. female with history of depression, PTSD, alcohol abuse, smoker, previous stroke and GI hemorrhage admitted on 09/26/15 for new onset left-sided weakness, slurred speech and left facial droop. MRI showed right CR infarct but also old BG, thalamus and pontine infarcts, all consistent with small vessel disease. MRA, CUS, TTE, and A1C unremarkable. LDL 215. She was on ASA in the past, but took off due to GIB caused by gastric ulcer. Therefore, she was put on plavix and lipitor 80 for stroke prevention. She was later discharged to CIR.  Interval History During the interval time, the patient has been doing better. Left sided weakness much improved and able to walk with walker. Finished with home PT/OT and will start with outpt PT/OT soon. She has quit smoking since the stroke. Followed with PCP last month. On plavix and lipitor without side effect. BP today 104/67.     REVIEW OF SYSTEMS: Full 14 system review of systems performed and notable only for those listed below and in HPI above, all others are negative:  Constitutional:  Weight gain Cardiovascular:  Ear/Nose/Throat:   Skin:  Eyes:   Respiratory:   Gastroitestinal:   Genitourinary:  Hematology/Lymphatic:  Easy bruising Endocrine:  Musculoskeletal:  cramps Allergy/Immunology:   Neurological:  Numbness, weakness, slurred speech Psychiatric: depression, anxiety Sleep:   The following represents the patient's updated allergies and side effects list: Allergies  Allergen Reactions  . Banana Other (See Comments)    Welts on skin and sores in mouth  . Penicillins Other (See Comments)    Blister Has patient had a PCN reaction  causing immediate rash, facial/tongue/throat swelling, SOB or lightheadedness with hypotension: no Has patient had a PCN reaction causing severe rash involving mucus membranes or skin necrosis: yes - blisters Has patient had a PCN reaction that required hospitalization no Has patient had a PCN reaction occurring within the last 10 years: no If all of the above answers are "NO", then may proceed with Cephalosporin use.   Gwynneth Albright. Simbrinza [Brinzolamide-Brimonidine] Swelling    Eyes swell shut    The neurologically relevant items on the patient's problem list were reviewed on today's visit.  Neurologic Examination  A problem focused neurological exam (12 or more points of the single system neurologic examination, vital signs counts as 1 point, cranial nerves count for 8 points) was performed.  Blood pressure 104/67, pulse 70, height 5\' 4"  (1.626 m), weight 134 lb 3.2 oz (60.9 kg).  General - Well nourished, well developed, in no apparent distress.  Ophthalmologic - Fundi not visualized due to noncooperation.  Cardiovascular - Regular rate and rhythm with no murmur.  Mental Status -  Level of arousal and orientation to month, date, place, and person were intact, but not orientated to year. Language including expression, naming, comprehension was assessed and found intact, however, has difficulty repeating complex sentences. Mild dysarthria.   Cranial Nerves II - XII - II - Visual field intact OU, however seems to have right lower quadrant simutagnosia. III, IV, VI - Extraocular movements intact. V - Facial sensation intact bilaterally. VII - left facial droop. VIII - Hearing & vestibular intact bilaterally. X - Palate elevates symmetrically, mild dysarthria. XI -  Chin turning & shoulder shrug intact bilaterally. XII - Tongue protrusion intact.  Motor Strength - The patient's strength was normal in RUE and RLE, but LUE 4/5 and LLE 3+/5 with brace and pronator drift was absent.  Bulk was  normal and fasciculations were absent.   Motor Tone - Muscle tone was assessed at the neck and appendages and was normal.  Reflexes - The patient's reflexes were 1+ in all extremities and she had no pathological reflexes.  Sensory - Light touch, temperature/pinprick were assessed and were normal.    Coordination - The patient had normal movements in the hands and feet with no ataxia or dysmetria.  Tremor was absent.  Gait and Station - walk with walker and left hemiparetic gait with foot drop although on AFO.   Functional score  mRS = 3   0 - No symptoms.   1 - No significant disability. Able to carry out all usual activities, despite some symptoms.   2 - Slight disability. Able to look after own affairs without assistance, but unable to carry out all previous activities.   3 - Moderate disability. Requires some help, but able to walk unassisted.   4 - Moderately severe disability. Unable to attend to own bodily needs without assistance, and unable to walk unassisted.   5 - Severe disability. Requires constant nursing care and attention, bedridden, incontinent.   6 - Dead.   NIH Stroke Scale   Level Of Consciousness 0=Alert; keenly responsive 1=Not alert, but arousable by minor stimulation 2=Not alert, requires repeated stimulation 3=Responds only with reflex movements 0  LOC Questions to Month and Age 75=Answers both questions correctly 1=Answers one question correctly 2=Answers neither question correctly 0  LOC Commands      -Open/Close eyes     -Open/close grip 0=Performs both tasks correctly 1=Performs one task correctly 2=Performs neighter task correctly 0  Best Gaze 0=Normal 1=Partial gaze palsy 2=Forced deviation, or total gaze paresis 0  Visual 0=No visual loss 1=Partial hemianopia 2=Complete hemianopia 3=Bilateral hemianopia (blind including cortical blindness) 0  Facial Palsy 0=Normal symmetrical movement 1=Minor paralysis (asymmetry) 2=Partial  paralysis (lower face) 3=Complete paralysis (upper and lower face) 2  Motor  0=No drift, limb holds posture for full 10 seconds 1=Drift, limb holds posture, no drift to bed 2=Some antigravity effort, cannot maintain posture, drifts to bed 3=No effort against gravity, limb falls 4=No movement Right Arm 0     Leg 0    Left Arm 1     Leg 1  Limb Ataxia 0=Absent 1=Present in one limb 2=Present in two limbs 0  Sensory 0=Normal 1=Mild to moderate sensory loss 2=Severe to total sensory loss 0  Best Language 0=No aphasia, normal 1=Mild to moderate aphasia 2=Mute, global aphasia 3=Mute, global aphasia 0  Dysarthria 0=Normal 1=Mild to moderate 2=Severe, unintelligible or mute/anarthric 1  Extinction/Neglect 0=No abnormality 1=Extinction to bilateral simultaneous stimulation 2=Profound neglect 0  Total   5     Data reviewed: I personally reviewed the images and agree with the radiology interpretations.  Ct Head Wo Contrast 09/26/2015   No acute intracranial pathology. If symptoms persist and there are no contraindications,   Mri and Mra Head Wo Contrast 09/27/2015  MRI HEAD  1. 16 mm acute ischemic nonhemorrhagic infarct involving the posterior right corona radiata. No associated mass effect.  2. Multiple remote lacunar infarcts involving the bilateral external capsules/basal ganglia, bilateral thalami, and the right paramedian pons.  3. Mild chronic small vessel ischemic disease.  4. Acute left  maxillary sinusitis.  MRA HEAD  1. Negative for large vessel occlusion.  2. Atheromatous irregularity with superimposed mild to moderate multi focal stenoses. Overall, these changes have progressed from prior.  3. Extensive atheromatous irregularity with stenoses involving the PCAs bilaterally, also progressed from prior.  4. Moderate multifocal atheromatous irregularity with stenoses involving the MCA branches bilaterally, left greater than right.  5. 3 mm focal outpouching arising from  the right posterior aspect of the proximal basilar artery, which may or reflect a small aneurysm or tortuosity of the proximal right AICA. Attention at follow-up recommended.   Dg Chest Port 1 View 09/26/2015   1. Shallow lung inflation with patchy and hazy bibasilar opacities, right greater than left. Atelectasis is favored. Superimposed infection, particularly at the right lung base, could be considered in the correct clinical setting. 2. Aortic atherosclerosis.  CUS - Bilateral: 1-39% ICA stenosis. Vertebral artery flow is antegrade.  TTE - - Normal LV size with EF 55%. Moderate diastolic dysfunction.  Normal RV size and systolic function. No significant valvular  abnormalities.  Component     Latest Ref Rng & Units 09/27/2015  Cholesterol     0 - 200 mg/dL 409 (H)  Triglycerides     <150 mg/dL 811  HDL Cholesterol     >40 mg/dL 40 (L)  Total CHOL/HDL Ratio     RATIO 7.1  VLDL     0 - 40 mg/dL 29  LDL (calc)     0 - 99 mg/dL 914 (H)  Hemoglobin N8G     4.8 - 5.6 % 5.3  Mean Plasma Glucose     mg/dL 956    Assessment: As you may recall, she is a 60 y.o. African American female with PMH of depression, PTSD, alcohol abuse, smoker, previous stroke and GI hemorrhage admitted on 09/26/15 for right CR infarct but also old BG, thalamus and pontine infarcts on MRI, all consistent with small vessel disease. MRA, CUS, TTE, and A1C unremarkable. LDL 215. She was on ASA in the past, but took off due to GIB caused by gastric ulcer. She was put on plavix and lipitor 80 for stroke prevention and discharged to CIR. During the interval time, left sided weakness much improved and able to walk with walker. Finished with home PT/OT and will start with outpt PT/OT soon. She has quit smoking since the stroke.   Plan:  - continue plavix and lipitor for stroke prevention - continue abstain from smoking - Follow up with your primary care physician for stroke risk factor modification. Recommend  maintain blood pressure goal <130/80, diabetes with hemoglobin A1c goal below 7.0% and lipids with LDL cholesterol goal below 70 mg/dL.  - check BP at home and record - continue outpt PT/OT and work hard with therapy - follow up in 3 months.   I spent more than 25 minutes of face to face time with the patient. Greater than 50% of time was spent in counseling and coordination of care. We discussed working hard with therapy, compliance with medication, continue abstain from smoking.   No orders of the defined types were placed in this encounter.   No orders of the defined types were placed in this encounter.   Patient Instructions  - continue plavix and lipitor for stroke prevention - continue abstain from smoking - Follow up with your primary care physician for stroke risk factor modification. Recommend maintain blood pressure goal <130/80, diabetes with hemoglobin A1c goal below 7.0% and lipids with LDL cholesterol  goal below 70 mg/dL.  - check BP at home and record - continue outpt PT/OT and work hard with therapy - follow up in 3 months.    Marvel PlanJindong Gerado Nabers, MD PhD [facility] [address], [address] [place], [place] [address] [phone]

## 2016-02-01 ENCOUNTER — Other Ambulatory Visit: Payer: Self-pay

## 2016-02-01 ENCOUNTER — Telehealth: Payer: Self-pay | Admitting: Neurology

## 2016-02-01 DIAGNOSIS — I63311 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 cerebral artery: Secondary | ICD-10-CM

## 2016-02-01 NOTE — Telephone Encounter (Signed)
Pt's niece said she finished PT about 1 mth ago. She said the at home therapy never called back to set up oupt therapy. She said Dr Roda ShuttersXu advised he would order it if needed to.

## 2016-02-02 ENCOUNTER — Encounter: Payer: Self-pay | Admitting: Physical Medicine & Rehabilitation

## 2016-02-02 ENCOUNTER — Encounter: Payer: Medicaid Other | Attending: Physical Medicine & Rehabilitation

## 2016-02-02 ENCOUNTER — Ambulatory Visit (HOSPITAL_BASED_OUTPATIENT_CLINIC_OR_DEPARTMENT_OTHER): Payer: Medicaid Other | Admitting: Physical Medicine & Rehabilitation

## 2016-02-02 VITALS — BP 139/79 | HR 72 | Resp 14

## 2016-02-02 DIAGNOSIS — Z720 Tobacco use: Secondary | ICD-10-CM | POA: Insufficient documentation

## 2016-02-02 DIAGNOSIS — K859 Acute pancreatitis without necrosis or infection, unspecified: Secondary | ICD-10-CM | POA: Insufficient documentation

## 2016-02-02 DIAGNOSIS — K760 Fatty (change of) liver, not elsewhere classified: Secondary | ICD-10-CM | POA: Diagnosis not present

## 2016-02-02 DIAGNOSIS — M545 Low back pain: Secondary | ICD-10-CM | POA: Diagnosis not present

## 2016-02-02 DIAGNOSIS — Z5189 Encounter for other specified aftercare: Secondary | ICD-10-CM | POA: Diagnosis present

## 2016-02-02 DIAGNOSIS — K5900 Constipation, unspecified: Secondary | ICD-10-CM | POA: Diagnosis not present

## 2016-02-02 DIAGNOSIS — F1010 Alcohol abuse, uncomplicated: Secondary | ICD-10-CM | POA: Insufficient documentation

## 2016-02-02 DIAGNOSIS — N3289 Other specified disorders of bladder: Secondary | ICD-10-CM | POA: Insufficient documentation

## 2016-02-02 DIAGNOSIS — I69352 Hemiplegia and hemiparesis following cerebral infarction affecting left dominant side: Secondary | ICD-10-CM | POA: Diagnosis present

## 2016-02-02 DIAGNOSIS — I638 Other cerebral infarction: Secondary | ICD-10-CM | POA: Diagnosis not present

## 2016-02-02 DIAGNOSIS — F329 Major depressive disorder, single episode, unspecified: Secondary | ICD-10-CM | POA: Insufficient documentation

## 2016-02-02 DIAGNOSIS — Z87440 Personal history of urinary (tract) infections: Secondary | ICD-10-CM | POA: Diagnosis not present

## 2016-02-02 DIAGNOSIS — IMO0002 Reserved for concepts with insufficient information to code with codable children: Secondary | ICD-10-CM

## 2016-02-02 DIAGNOSIS — G8112 Spastic hemiplegia affecting left dominant side: Secondary | ICD-10-CM | POA: Diagnosis not present

## 2016-02-02 DIAGNOSIS — R269 Unspecified abnormalities of gait and mobility: Secondary | ICD-10-CM | POA: Insufficient documentation

## 2016-02-02 DIAGNOSIS — F4310 Post-traumatic stress disorder, unspecified: Secondary | ICD-10-CM | POA: Diagnosis not present

## 2016-02-02 DIAGNOSIS — G8929 Other chronic pain: Secondary | ICD-10-CM | POA: Diagnosis present

## 2016-02-02 DIAGNOSIS — I639 Cerebral infarction, unspecified: Secondary | ICD-10-CM

## 2016-02-02 MED ORDER — TRAMADOL-ACETAMINOPHEN 37.5-325 MG PO TABS: 1.0000 | ORAL_TABLET | ORAL | 0 refills | Status: DC | PRN

## 2016-02-02 NOTE — Telephone Encounter (Signed)
Referral put in for outpatient therapy and physical therapy.

## 2016-02-02 NOTE — Progress Notes (Signed)
Subjective:    Patient ID: [name], female    DOB: Apr 14, 1955, 60 y.o.   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 She has a past history of chronic back pain as well as alcohol abuse. She reports that she has been discharged from her pain management physician practice. This was for missing appointments.    6 weeks ago performed botulinum toxin injection for left lower extremity spasticity. Injected 75 units into the great toe flexor, 75 units into the flexor digitorum longus, 25 units into the left gastroc and 25 and to the left soleus She states that when she walks without her brace her toes still curl Pain Inventory Average Pain 9 Pain Right Now 8 My pain is aching  In the last 24 hours, has pain interfered with the following? General activity 5 Relation with others 6 Enjoyment of life 7 What TIME of day is your pain at its worst? night Sleep (in general) Poor  Pain is worse with: standing Pain improves with: rest Relief from Meds: 5  Mobility walk with assistance how many minutes can you walk? 30 needs help with transfers Do you have any goals in this area?  yes  Function disabled: date disabled n/a I need assistance with the following:  bathing Do you have any goals in this area?  yes  Neuro/Psych No problems in this area  Prior Studies Any changes since last visit?  no  Physicians involved in your care Any changes since last visit?  no   Family History  Problem Relation Age of Onset  . Stroke Mother   . Cancer Sister     type unknown  . Diabetes Father    Social History   Social History  . Marital status: Widowed    Spouse name: N/A  . Number of children: 0  . Years of education: N/A   Social History Main Topics  . Smoking status: Former Smoker    Packs/day: 0.50    Types: Cigarettes    Quit date: 09/26/2015  . Smokeless tobacco: Former NeurosurgeonUser  . Alcohol use No  .  Drug use: No  . Sexual activity: Not Asked   Other Topics Concern  . None   Social History Narrative  . None   Past Surgical History:  Procedure Laterality Date  . ABDOMINAL HYSTERECTOMY    . APPENDECTOMY    . COLONOSCOPY WITH PROPOFOL N/A 08/13/2015   Procedure: COLONOSCOPY WITH PROPOFOL;  Surgeon: Rachael Feeaniel P Jacobs, MD;  Location: [facility];  Service: Endoscopy;  Laterality: N/A;  . ESOPHAGOGASTRODUODENOSCOPY N/A 05/23/2014   Procedure: ESOPHAGOGASTRODUODENOSCOPY (EGD);  Surgeon: Beverley FiedlerJay M Pyrtle, MD;  [facility];  Service: Endoscopy;  Laterality: N/A;  . ESOPHAGOGASTRODUODENOSCOPY (EGD) WITH PROPOFOL N/A 08/13/2015   Procedure: ESOPHAGOGASTRODUODENOSCOPY (EGD) WITH PROPOFOL;  Surgeon: Rachael Feeaniel P Jacobs, MD;  Location: [facility];  Service: Endoscopy;  Laterality: N/A;  . ESOPHAGOGASTRODUODENOSCOPY ENDOSCOPY    . gall stone removed     Past Medical History:  Diagnosis Date  . Alcohol abuse   . Chronic back pain   . Colitis   . CVA (cerebral infarction)   . Fatty liver   . Gastric ulcer   . GI bleed   . Hepatitis    Hepatitis C  . MDD (major depressive disorder)   . Pancreatitis   . PTSD (post-traumatic stress disorder)   . Seizures (HCC)   . Spastic hemiplegia affecting left dominant side (  HCC) 11/05/2015  . Stroke (HCC)   . UTI (lower urinary tract infection)   . Vision abnormalities    BP 139/79   Pulse 72   Resp 14   SpO2 94%   Opioid Risk Score:   Fall Risk Score:  `1  Depression screen PHQ 2/9  Depression screen PHQ 2/9 11/05/2015  Decreased Interest 0  Down, Depressed, Hopeless 0  PHQ - 2 Score 0  Altered sleeping 0  Tired, decreased energy 2  Change in appetite 0  Feeling bad or failure about yourself  0  Trouble concentrating 0  Moving slowly or fidgety/restless 0  Suicidal thoughts 0  PHQ-9 Score 2  Difficult doing work/chores Not difficult at all     Review of Systems  Constitutional: Negative.   HENT: Negative.   Eyes: Negative.     Respiratory: Negative.   Cardiovascular: Negative.   Gastrointestinal: Negative.   Endocrine: Negative.   Genitourinary: Negative.   Musculoskeletal: Negative.   Skin: Negative.   Allergic/Immunologic: Negative.   Neurological: Negative.   Hematological: Negative.   Psychiatric/Behavioral: Negative.   All other systems reviewed and are negative.      Objective:   Physical Exam  Constitutional: She appears well-developed and well-nourished.  HENT:  Head: Normocephalic and atraumatic.  Eyes: Conjunctivae and EOM are normal. Pupils are equal, round, and reactive to light.  Psychiatric: She has a normal mood and affect.  Nursing note and vitals reviewed.   Ashworth grade 1 at the left ankle plantar flexors. Ashworth grade 1 at the toe flexors as well as great toe flexor. Motor strength is trace in the left ankle dorsiflexors, 2 minus, ankle plantar flexors. Knee extensors 3 Ambulates with a walker as well as left AFO. Good foot control. However, knee control. Still has some genuine rocker-bottom. With shoe, sock, and brace off. Patient exhibited No curling of the left toe flexors during stance phase. During swing phase. She did have toe curling.      Assessment & Plan:  1. Left spastic hemiplegia, overall improved with toe flexion spasticity left lower extremity, at least now has foot plantigrade during stance phase. Some toe curling still noted during swing phase. We discussed treatment options. This would include repeating botulinum toxin injection in 6 weeks when it wears off versus trial of left tibial nerve block with phenol. She is opting for the latter. Continue outpatient therapy  Patient asking for some pain medication since she was discharged from pain management. Can prescribe her 30 tablets of Ultracet until she gets in with primary care.

## 2016-02-22 ENCOUNTER — Ambulatory Visit: Payer: Medicaid Other | Admitting: Rehabilitation

## 2016-02-22 ENCOUNTER — Ambulatory Visit: Payer: Medicaid Other | Admitting: Occupational Therapy

## 2016-03-15 ENCOUNTER — Encounter: Payer: Medicaid Other | Attending: Physical Medicine & Rehabilitation

## 2016-03-15 ENCOUNTER — Ambulatory Visit: Payer: Medicaid Other | Admitting: Physical Medicine & Rehabilitation

## 2016-03-15 DIAGNOSIS — F4310 Post-traumatic stress disorder, unspecified: Secondary | ICD-10-CM | POA: Insufficient documentation

## 2016-03-15 DIAGNOSIS — K760 Fatty (change of) liver, not elsewhere classified: Secondary | ICD-10-CM | POA: Insufficient documentation

## 2016-03-15 DIAGNOSIS — I638 Other cerebral infarction: Secondary | ICD-10-CM | POA: Insufficient documentation

## 2016-03-15 DIAGNOSIS — Z720 Tobacco use: Secondary | ICD-10-CM | POA: Insufficient documentation

## 2016-03-15 DIAGNOSIS — G8929 Other chronic pain: Secondary | ICD-10-CM | POA: Insufficient documentation

## 2016-03-15 DIAGNOSIS — Z5189 Encounter for other specified aftercare: Secondary | ICD-10-CM | POA: Insufficient documentation

## 2016-03-15 DIAGNOSIS — M545 Low back pain: Secondary | ICD-10-CM | POA: Insufficient documentation

## 2016-03-15 DIAGNOSIS — F329 Major depressive disorder, single episode, unspecified: Secondary | ICD-10-CM | POA: Insufficient documentation

## 2016-03-15 DIAGNOSIS — N3289 Other specified disorders of bladder: Secondary | ICD-10-CM | POA: Insufficient documentation

## 2016-03-15 DIAGNOSIS — I69352 Hemiplegia and hemiparesis following cerebral infarction affecting left dominant side: Secondary | ICD-10-CM | POA: Insufficient documentation

## 2016-03-15 DIAGNOSIS — K5900 Constipation, unspecified: Secondary | ICD-10-CM | POA: Insufficient documentation

## 2016-03-15 DIAGNOSIS — Z87440 Personal history of urinary (tract) infections: Secondary | ICD-10-CM | POA: Insufficient documentation

## 2016-03-15 DIAGNOSIS — K859 Acute pancreatitis without necrosis or infection, unspecified: Secondary | ICD-10-CM | POA: Insufficient documentation

## 2016-03-15 DIAGNOSIS — R269 Unspecified abnormalities of gait and mobility: Secondary | ICD-10-CM | POA: Insufficient documentation

## 2016-03-15 DIAGNOSIS — F1010 Alcohol abuse, uncomplicated: Secondary | ICD-10-CM | POA: Insufficient documentation

## 2016-03-24 ENCOUNTER — Encounter: Payer: Self-pay | Admitting: Rehabilitation

## 2016-03-24 ENCOUNTER — Ambulatory Visit: Payer: Medicaid Other | Attending: Neurology | Admitting: Rehabilitation

## 2016-03-24 DIAGNOSIS — R2689 Other abnormalities of gait and mobility: Secondary | ICD-10-CM | POA: Diagnosis present

## 2016-03-24 DIAGNOSIS — R293 Abnormal posture: Secondary | ICD-10-CM | POA: Diagnosis present

## 2016-03-24 DIAGNOSIS — I69352 Hemiplegia and hemiparesis following cerebral infarction affecting left dominant side: Secondary | ICD-10-CM | POA: Diagnosis present

## 2016-03-24 DIAGNOSIS — R2681 Unsteadiness on feet: Secondary | ICD-10-CM | POA: Insufficient documentation

## 2016-03-24 NOTE — Therapy (Signed)
[facility] [address] [place], [place], [address] Phone: [phone]   Fax:  [phone]  Physical Therapy Evaluation  Patient Details  Name: [name] MRN: [id number] Date of Birth: 01/03/1956 Referring Provider: Marvel Plan, MD   Encounter Date: 03/24/2016      PT End of Session - 03/24/16 1609    Visit Number 1   Number of Visits 4  3 visits plus eval   Date for PT Re-Evaluation 06/22/16   Authorization Type MCD (awaiting approval from MCD)   PT Start Time 1402   PT Stop Time 1446   PT Time Calculation (min) 44 min   Activity Tolerance Patient tolerated treatment well   Behavior During Therapy [facility] for tasks assessed/performed      Past Medical History:  Diagnosis Date  . Alcohol abuse   . Chronic back pain   . Colitis   . CVA (cerebral infarction)   . Fatty liver   . Gastric ulcer   . GI bleed   . Hepatitis    Hepatitis C  . MDD (major depressive disorder)   . Pancreatitis   . PTSD (post-traumatic stress disorder)   . Seizures (HCC)   . 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 Stroke (HCC)   . UTI (lower urinary tract infection)   . Vision abnormalities     Past Surgical History:  Procedure Laterality Date  . ABDOMINAL HYSTERECTOMY    . APPENDECTOMY    . COLONOSCOPY WITH PROPOFOL N/A 08/13/2015   Procedure: COLONOSCOPY WITH PROPOFOL;  Surgeon: Rachael Fee, MD;  Location: [facility] ENDOSCOPY;  Service: Endoscopy;  Laterality: N/A;  . ESOPHAGOGASTRODUODENOSCOPY N/A 05/23/2014   Procedure: ESOPHAGOGASTRODUODENOSCOPY (EGD);  Surgeon: Beverley Fiedler, MD;  Location: [facility] ENDOSCOPY;  Service: Endoscopy;  Laterality: N/A;  . ESOPHAGOGASTRODUODENOSCOPY (EGD) WITH PROPOFOL N/A 08/13/2015   Procedure: ESOPHAGOGASTRODUODENOSCOPY (EGD) WITH PROPOFOL;  Surgeon: Rachael Fee, MD;  Location: [facility] ENDOSCOPY;  Service: Endoscopy;  Laterality: N/A;  . ESOPHAGOGASTRODUODENOSCOPY ENDOSCOPY    .  gall stone removed      There were no vitals filed for this visit.       Subjective Assessment - 03/24/16 1410    Subjective "I had a stroke.  I am wanting to work on my arm and my leg."    Limitations House hold activities;Walking;Standing   Patient Stated Goals "I want to get stronger and walk better."    Currently in Pain? No/denies            [facility] PT Assessment - 03/24/16 0001      Assessment   Medical Diagnosis R CVA   Referring Provider Marvel Plan, MD    Onset Date/Surgical Date 09/26/15   Prior Therapy acute, IP rehab, and HHPT/OT     Precautions   Precautions Fall   Precaution Comments wears AFO on LLE     Restrictions   Weight Bearing Restrictions No     Balance Screen   Has the patient fallen in the past 6 months Yes   How many times? 12   Has the patient had a decrease in activity level because of a fear of falling?  Yes   Is the patient reluctant to leave their home because of a fear of falling?  Yes     Home Environment   Living Environment Private residence   Living Arrangements Other relatives  niece and nephew   Available Help at Discharge Family;Available 24 hours/day  Type of Home House   Home Access Stairs to enter   Entrance Stairs-Number of Steps 1   Entrance Stairs-Rails None   Home Layout Two level   Alternate Level Stairs-Number of Steps 14   Alternate Level Stairs-Rails Right   Home Equipment Walker - 4 wheels;Shower seat;Bedside commode;Grab bars - tub/shower  tub/shower-get assist     Prior Function   Level of Independence Independent   Vocation On disability   Leisure swim, walk, crochet     Cognition   Overall Cognitive Status Impaired/Different from baseline   Area of Impairment Memory   Memory Decreased short-term memory;Decreased recall of precautions     Sensation   Light Touch Impaired Detail   Light Touch Impaired Details Impaired LUE;Impaired LLE   Hot/Cold Appears Intact   Proprioception Appears Intact      Coordination   Gross Motor Movements are Fluid and Coordinated No   Fine Motor Movements are Fluid and Coordinated No   Heel Shin Test impaired due to L hemiplegia     Tone   Assessment Location Left Lower Extremity     ROM / Strength   AROM / PROM / Strength Strength     Strength   Overall Strength Deficits   Overall Strength Comments L hip flex 2/5, L knee ext 2+/5, L knee flex 2-/5, L ankle DF 1/5, L ankle PF 1/5     Transfers   Transfers Sit to Stand;Stand to Sit   Sit to Stand 6: Modified independent (Device/Increase time)   Five time sit to stand comments  25.78 secs with BUE support   Stand to Sit 6: Modified independent (Device/Increase time)     Ambulation/Gait   Ambulation/Gait Yes   Ambulation/Gait Assistance 4: Min guard   Ambulation Distance (Feet) 115 Feet   Assistive device 4-wheeled walker  L custom AFO   Gait Pattern Step-to pattern;Lateral hip instability;Trunk flexed;Poor foot clearance - left;Decreased dorsiflexion - left;Decreased weight shift to left;Decreased hip/knee flexion - left;Decreased step length - left;Decreased stance time - left   Ambulation Surface Level;Indoor   Gait velocity 1.52 ft/sec with rollator and min/guard   Gait Comments Also did brief assessment of gait with L blue rocker AFO in hopes that this would better control R foot drag due to increased tone. Note that this does assist, however she does demonstrate recurvatum, however feel that this could be better controlled with heel wedge (did not try due to time).       LLE Tone   LLE Tone Modified Ashworth     LLE Tone   Modified Ashworth Scale for Grading Hypertonia LLE Considerable increase in muschle tone, passive movement difficult  during standing active mvmt in LLE                           PT Education - 03/24/16 1608    Education provided Yes   Education Details POC, goals, bringing her other brace when she comes on next visit and using regular RW at all  times for increased safety.    Person(s) Educated Patient   Methods Explanation   Comprehension Verbalized understanding          PT Short Term Goals - 03/24/16 1621      PT SHORT TERM GOAL #1   Title Pt will initiate HEP in order to indicate improved functional mobility and decreased fall risk.  (Target Date:  following first visit)   Baseline dependent  PT SHORT TERM GOAL #2   Title Pt will demonstrate compliance with use of RW and appropriate brace to best control R foot drag and improve safety.     Baseline consistently using RW at home but rollator in community           PT Long Term Goals - 03/24/16 1632      PT LONG TERM GOAL #1   Title Pt will be independent with HEP in order to indicate improved functional mobility and decreased fall risk.  (Target Date:  Following 3rd visit)   Baseline dependent     PT LONG TERM GOAL #2   Title Pt will perform 5TSS in less than 20 secs with single UE support only in order to indicate improved functional strenth.     Baseline 25.78 secs with BUE support     PT LONG TERM GOAL #3   Title Pt will ambulate 300' with RW and AFO at mod I level over indoor surfaces in order to indicate safe home negotiation.     Baseline 115' with rollator and AFO at min/guard level     PT LONG TERM GOAL #4   Title Pt will ambulate 500' over unlevel paved outdoor surfaces (including ramp and curb step) w/ RW and AFO at S level in order to indicate safe return to community.     Baseline unable to assess outdoor gait due to time               Plan - 03/24/16 1610    Clinical Impression Statement Pt presents with R CVA on 09/26/15 and was hospitalized until 10/26/15 with resultant L spastic hemiplegia (Hemiplegia and hemiparesis following cerebral infacrtion affecting L dominant side I69.352).  Note that she has decreased balance, decreased strength and markedly increased tone in L ankle and toes during swing phase of gait causing L foot drag  despite having custom AFO donned.  Note history of ETOH abuse and chronic back pain as well as cognitive deficits that could impact progress in therapy.  Upon PT evaluation note that 5TSS time was 25.78 secs with BUE support indicative of decreased functional strength, and gait speed of 1.52 ft/sec with rollator, L AFO and min/guard indicative of increased fall risk.  Briefly assessed gait with blue rocker AFO as she states the other brace she has at home resembles this one.  Note improvement with foot clearance however does demonstrate genu recurvatum.  Pt is of evolving presentation and moderate complexity from PT POC standpoint.  Pt will benefit from skilled OP neuro PT in order to address deficits.     Rehab Potential Good   Clinical Impairments Affecting Rehab Potential time out from CVA and visit limitations per MCD   PT Frequency Monthy   PT Duration Other (comment)  over 3 months   PT Treatment/Interventions ADLs/Self Care Home Management;Electrical Stimulation;DME Instruction;Gait training;Stair training;Functional mobility training;Therapeutic activities;Therapeutic exercise;Balance training;Neuromuscular re-education;Patient/family education;Orthotic Fit/Training;Energy conservation;Vestibular   PT Next Visit Plan Assess gait with her brace and regular RW (she is supposed to have these at next visit-give tennis balls), give HEP for LLE strengthening and functional strength   Consulted and Agree with Plan of Care Patient      Patient will benefit from skilled therapeutic intervention in order to improve the following deficits and impairments:  Abnormal gait, Decreased activity tolerance, Decreased balance, Decreased cognition, Decreased coordination, Decreased endurance, Decreased knowledge of precautions, Decreased knowledge of use of DME, Decreased mobility, Decreased safety awareness, Decreased strength, Difficulty  walking, Impaired perceived functional ability, Impaired sensation, Impaired  tone, Improper body mechanics, Postural dysfunction  Visit Diagnosis: Hemiplegia and hemiparesis following cerebral infarction affecting left dominant side (HCC) - Plan: PT plan of care cert/re-cert  Unsteadiness on feet - Plan: PT plan of care cert/re-cert  Other abnormalities of gait and mobility - Plan: PT plan of care cert/re-cert  Abnormal posture - Plan: PT plan of care cert/re-cert     Problem List Patient Active Problem List   Diagnosis Date Noted  . Former smoker 01/11/2016  . 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 Left hip pain   . History of GI bleed   . Spastic bladder   . Chronic back pain   . ETOH abuse   . Hypokalemia   . PTSD (post-traumatic stress disorder)   . Gastroesophageal reflux disease without esophagitis   . Acute ischemic stroke (HCC) 09/27/2015  . Acute CVA (cerebrovascular accident) (HCC) 09/27/2015  . Tobacco abuse   . Left-sided weakness 09/26/2015  . Acute gastric ulcer   . GI bleed 05/22/2014  . Acute GI bleeding 05/22/2014  . Acute blood loss anemia 05/22/2014  . History of stroke 05/22/2014  . Hyperlipidemia 05/22/2014  . Gastrointestinal hemorrhage with melena   . Cerebral infarction (HCC) 10/09/2012  . Flank pain 10/09/2012  . Right pontine stroke (HCC) 10/09/2012  . ABDOMINAL PAIN OTHER SPECIFIED SITE 05/17/2010  . ALCOHOL ABUSE 06/16/2007  . COCAINE ABUSE 06/16/2007  . DEPRESSION 06/16/2007  . Essential hypertension 06/16/2007  . CEREBROVASCULAR ACCIDENT 06/16/2007  . ASTHMA 06/16/2007  . GERD 06/16/2007  . PANCREATITIS 11/09/2006  . ESOPHAGITIS, REFLUX 05/23/2006    Harriet Butte, PT, MPT [facility] [address] [place], [place], [address] Phone: [phone]   Fax:  [phone] 03/24/16, 4:40 PM  Name: [name] MRN: [id number] Date of Birth: [birth year]

## 2016-04-11 ENCOUNTER — Ambulatory Visit: Payer: Medicaid Other | Admitting: Rehabilitation

## 2016-04-11 ENCOUNTER — Encounter: Payer: Self-pay | Admitting: Rehabilitation

## 2016-04-11 DIAGNOSIS — I69352 Hemiplegia and hemiparesis following cerebral infarction affecting left dominant side: Secondary | ICD-10-CM | POA: Diagnosis not present

## 2016-04-11 DIAGNOSIS — R2681 Unsteadiness on feet: Secondary | ICD-10-CM

## 2016-04-11 DIAGNOSIS — R2689 Other abnormalities of gait and mobility: Secondary | ICD-10-CM

## 2016-04-11 DIAGNOSIS — R293 Abnormal posture: Secondary | ICD-10-CM

## 2016-04-11 NOTE — Patient Instructions (Signed)
Functional Quadriceps: Sit to Stand    Sit on edge of chair, feet flat on floor. Stand upright, extending knees fully.  You really focus on making sure your feet stay side by side and don't use your hands.  Repeat __10__ times per set. Do __1__ sets per session. Do _1-2___ sessions per day.  http://orth.exer.us/735   Copyright  VHI. All rights reserved.   Bridging    Slowly raise buttocks from floor, keeping stomach tight. Repeat _10___ times per set. Do __1__ sets per session. Do __1-2__ sessions per day.  http://orth.exer.us/1097   Copyright  VHI. All rights reserved.   HIP / KNEE: Flexion, Knee to Chest - Supine    Raise knee to chest. Use your hands to pull back as far as you can comfortably.  Keep leg in a straight line. Perform slowly and hold for 30-60 secs.  Repeat 3-4 times and do 2-3 times per day.  Copyright  VHI. All rights reserved.    Bracing With Heel Slides (Supine)    With neutral spine, tighten pelvic floor and abdominals and hold. Alternating legs, slide heel to bottom. Repeat 10___ times. Do _1-2__ times a day.  If your niece or family can help with this one, have them gently guide your leg if needed and remind you to keep your knee in line with your shoulder.     Copyright  VHI. All rights reserved.

## 2016-04-11 NOTE — Therapy (Signed)
[facility] [address] [place], [place], [address] Phone: [phone]   Fax:  [phone]  Physical Therapy Treatment  Patient Details  Name: [name] MRN: [id number] Date of Birth: 1955/05/29 Referring Provider: Rosalin Hawking, MD   Encounter Date: 04/11/2016      PT End of Session - 04/11/16 1310    Visit Number 2   Number of Visits 4  3 visits plus eval   Date for PT Re-Evaluation 06/22/16   Authorization Type MCD approved 3 visits from 1/26-4/19/18)   Authorization - Visit Number 1   Authorization - Number of Visits 3   PT Start Time 1102   PT Stop Time 1147   PT Time Calculation (min) 45 min   Activity Tolerance Patient tolerated treatment well   Behavior During Therapy [facility] for tasks assessed/performed      Past Medical History:  Diagnosis Date  . Alcohol abuse   . Chronic back pain   . Colitis   . CVA (cerebral infarction)   . Fatty liver   . Gastric ulcer   . GI bleed   . Hepatitis    Hepatitis C  . MDD (major depressive disorder)   . Pancreatitis   . PTSD (post-traumatic stress disorder)   . Seizures ([place])   . Spastic hemiplegia affecting left dominant side ([place]) 11/05/2015  . Stroke ([place])   . UTI (lower urinary tract infection)   . Vision abnormalities     Past Surgical History:  Procedure Laterality Date  . ABDOMINAL HYSTERECTOMY    . APPENDECTOMY    . COLONOSCOPY WITH PROPOFOL N/A 08/13/2015   Procedure: COLONOSCOPY WITH PROPOFOL;  Surgeon: Milus Banister, MD;  Location: [facility] ENDOSCOPY;  Service: Endoscopy;  Laterality: N/A;  . ESOPHAGOGASTRODUODENOSCOPY N/A 05/23/2014   Procedure: ESOPHAGOGASTRODUODENOSCOPY (EGD);  Surgeon: Jerene Bears, MD;  Location: [facility] ENDOSCOPY;  Service: Endoscopy;  Laterality: N/A;  . ESOPHAGOGASTRODUODENOSCOPY (EGD) WITH PROPOFOL N/A 08/13/2015   Procedure: ESOPHAGOGASTRODUODENOSCOPY (EGD) WITH PROPOFOL;  Surgeon: Milus Banister, MD;  Location: [facility] ENDOSCOPY;   Service: Endoscopy;  Laterality: N/A;  . ESOPHAGOGASTRODUODENOSCOPY ENDOSCOPY    . gall stone removed      There were no vitals filed for this visit.      Subjective Assessment - 04/11/16 1108    Subjective "I've been using this walker."    Limitations House hold activities;Walking;Standing   Patient Stated Goals "I want to get stronger and walk better."    Currently in Pain? No/denies                         [facility] Adult PT Treatment/Exercise - 04/11/16 1150      Transfers   Transfers Sit to Stand;Stand to Sit   Sit to Stand 5: Supervision   Sit to Stand Details Verbal cues for sequencing;Verbal cues for technique   Sit to Stand Details (indicate cue type and reason) Provided sit<>stand as part of HEP to ensure proper foot placement and equal WB to challenge LLE.  Also cues to perform without UE support.  Pt able to perform, see pt instruction.     Stand to Sit 5: Supervision   Stand to Sit Details (indicate cue type and reason) Verbal cues for sequencing;Verbal cues for technique     Ambulation/Gait   Ambulation/Gait Yes   Ambulation/Gait Assistance 5: Supervision   Ambulation/Gait Assistance Details Note that pt brought her other brace to today's session, similar to allard AFO.  Note marked improvement with clearance, however do note significant amount of L knee recurvatum.  Added small heel wedge and continued to note recurvatum, therefore added larger heel wedge with slight improvement.  Discussed with pt that PT would contact [employer] who made initial brace to see if we can make some corrections to either brace to allow best clearance and control knee.  Pt verbalized understanding.  Note she is also ambulating with RW which shows marked safety vs rollator.     Ambulation Distance (Feet) 115 Feet  x 2 and 50' x 2   Assistive device Rolling walker  L AFO   Gait Pattern Step-to pattern;Lateral hip instability;Trunk flexed;Poor foot clearance - left;Decreased  dorsiflexion - left;Decreased weight shift to left;Decreased hip/knee flexion - left;Decreased step length - left;Decreased stance time - left   Ambulation Surface Level;Indoor     Neuro Re-ed    Neuro Re-ed Details  see pt instruction for LLE NMR exercises for HEP.                 PT Education - 04/11/16 1109    Education provided Yes   Education Details using allard type brace, continued use of RW and initial HEP, see pt instruction   Person(s) Educated Patient   Methods Explanation;Demonstration;Handout   Comprehension Verbalized understanding;Returned demonstration;Need further instruction          PT Short Term Goals - 04/11/16 1312      PT SHORT TERM GOAL #1   Title Pt will initiate HEP in order to indicate improved functional mobility and decreased fall risk.  (Target Date:  following first visit)   Baseline met 04/11/16   Status Achieved     PT SHORT TERM GOAL #2   Title Pt will demonstrate compliance with use of RW and appropriate brace to best control R foot drag and improve safety.     Baseline met 04/11/16   Status Achieved           PT Long Term Goals - 03/24/16 1632      PT LONG TERM GOAL #1   Title Pt will be independent with HEP in order to indicate improved functional mobility and decreased fall risk.  (Target Date:  Following 3rd visit)   Baseline dependent     PT LONG TERM GOAL #2   Title Pt will perform 5TSS in less than 20 secs with single UE support only in order to indicate improved functional strenth.     Baseline 25.78 secs with BUE support     PT LONG TERM GOAL #3   Title Pt will ambulate 300' with RW and AFO at mod I level over indoor surfaces in order to indicate safe home negotiation.     Baseline 115' with rollator and AFO at min/guard level     PT LONG TERM GOAL #4   Title Pt will ambulate 500' over unlevel paved outdoor surfaces (including ramp and curb step) w/ RW and AFO at S level in order to indicate safe return to  community.     Baseline unable to assess outdoor gait due to time               Plan - 04/11/16 1311    Clinical Impression Statement Skilled session focused on gait with RW and her other personal AFO.  Note marked improvement in this brace vs custom plastic AFO.  Also initiated HEP for LLE NMR>    Rehab Potential Good   Clinical Impairments Affecting Rehab  Potential time out from CVA and visit limitations per MCD   PT Frequency Monthy   PT Duration Other (comment)  over 3 months   PT Treatment/Interventions ADLs/Self Care Home Management;Electrical Stimulation;DME Instruction;Gait training;Stair training;Functional mobility training;Therapeutic activities;Therapeutic exercise;Balance training;Neuromuscular re-education;Patient/family education;Orthotic Fit/Training;Energy conservation;Vestibular   PT Next Visit Plan Continue LLE NMR, if Gerald Stabs present, what can we do to brace for improved clearance and knee control).    Consulted and Agree with Plan of Care Patient      Patient will benefit from skilled therapeutic intervention in order to improve the following deficits and impairments:  Abnormal gait, Decreased activity tolerance, Decreased balance, Decreased cognition, Decreased coordination, Decreased endurance, Decreased knowledge of precautions, Decreased knowledge of use of DME, Decreased mobility, Decreased safety awareness, Decreased strength, Difficulty walking, Impaired perceived functional ability, Impaired sensation, Impaired tone, Improper body mechanics, Postural dysfunction  Visit Diagnosis: Hemiplegia and hemiparesis following cerebral infarction affecting left dominant side (HCC)  Unsteadiness on feet  Other abnormalities of gait and mobility  Abnormal posture     Problem List Patient Active Problem List   Diagnosis Date Noted  . Former smoker 01/11/2016  . 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 Left hip  pain   . History of GI bleed   . Spastic bladder   . Chronic back pain   . ETOH abuse   . Hypokalemia   . PTSD (post-traumatic stress disorder)   . Gastroesophageal reflux disease without esophagitis   . Acute ischemic stroke ([place]) 09/27/2015  . Acute CVA (cerebrovascular accident) ([place]) 09/27/2015  . Tobacco abuse   . Left-sided weakness 09/26/2015  . Acute gastric ulcer   . GI bleed 05/22/2014  . Acute GI bleeding 05/22/2014  . Acute blood loss anemia 05/22/2014  . History of stroke 05/22/2014  . Hyperlipidemia 05/22/2014  . Gastrointestinal hemorrhage with melena   . Cerebral infarction ([place]) 10/09/2012  . Flank pain 10/09/2012  . Right pontine stroke ([place]) 10/09/2012  . ABDOMINAL PAIN OTHER SPECIFIED SITE 05/17/2010  . ALCOHOL ABUSE 06/16/2007  . COCAINE ABUSE 06/16/2007  . DEPRESSION 06/16/2007  . Essential hypertension 06/16/2007  . CEREBROVASCULAR ACCIDENT 06/16/2007  . ASTHMA 06/16/2007  . GERD 06/16/2007  . PANCREATITIS 11/09/2006  . ESOPHAGITIS, REFLUX 05/23/2006    Cameron Sprang, PT, MPT [facility] [address] [place], [place], [address] Phone: [phone]   Fax:  [phone] 04/11/16, 4:99 PM  Name: [name] MRN: [id number] Date of Birth: Jul 15, 1955

## 2016-04-18 ENCOUNTER — Ambulatory Visit (INDEPENDENT_AMBULATORY_CARE_PROVIDER_SITE_OTHER): Payer: Medicaid Other | Admitting: Neurology

## 2016-04-18 ENCOUNTER — Encounter: Payer: Self-pay | Admitting: Neurology

## 2016-04-18 VITALS — BP 131/77 | HR 77 | Ht 64.0 in | Wt 134.4 lb

## 2016-04-18 DIAGNOSIS — I63311 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 cerebral artery: Secondary | ICD-10-CM

## 2016-04-18 DIAGNOSIS — I10 Essential (primary) hypertension: Secondary | ICD-10-CM

## 2016-04-18 DIAGNOSIS — F17200 Nicotine dependence, unspecified, uncomplicated: Secondary | ICD-10-CM

## 2016-04-18 DIAGNOSIS — E782 Mixed hyperlipidemia: Secondary | ICD-10-CM

## 2016-04-18 NOTE — Progress Notes (Signed)
STROKE NEUROLOGY FOLLOW UP NOTE  NAME: [name] DOB: [birth year]  REASON FOR VISIT: stroke follow up HISTORY FROM: pt, daughter and chart  Today we had the pleasure of seeing [name] in follow-up at our Neurology Clinic. Pt was accompanied by daughter.   History Summary [name] is a 61 y.o. female with history of depression, PTSD, alcohol abuse, smoker, previous stroke and GI hemorrhage admitted on 09/26/15 for new onset left-sided weakness, slurred speech and left facial droop. MRI showed right CR infarct but also old BG, thalamus and pontine infarcts, all consistent with small vessel disease. MRA, CUS, TTE, and A1C unremarkable. LDL 215. She was on ASA in the past, but took off due to GIB caused by gastric ulcer. Therefore, she was put on plavix and lipitor 80 for stroke prevention. She was later discharged to CIR.  01/11/16 follow up - the patient has been doing better. Left sided weakness much improved and able to walk with walker. Finished with home PT/OT and will start with outpt PT/OT soon. She has quit smoking since the stroke. Followed with PCP last month. On plavix and lipitor without side effect. BP today 104/67.     Interval History During the interval time, pt has been doing well. No recurrent stroke. Followed with Dr. Wynn Banker for left sided hemiparesis and received botox injection at left foot. Able to walk with cane at home although came in today with walker. However, she started to smoke again around last Christmas. BP 131/77 today, taking lisinopril at home.   REVIEW OF SYSTEMS: Full 14 system review of systems performed and notable only for those listed below and in HPI above, all others are negative:  Constitutional:   Cardiovascular:  Ear/Nose/Throat:   Skin:  Eyes:   Respiratory:   Gastroitestinal:   Genitourinary:  Hematology/Lymphatic:   Endocrine:  Musculoskeletal:   Allergy/Immunology:   Neurological:   Psychiatric:  Sleep:   The  following represents the patient's updated allergies and side effects list: Allergies  Allergen Reactions  . Banana Other (See Comments)    Welts on skin and sores in mouth  . Penicillins Other (See Comments)    Blister Has patient had a PCN reaction causing immediate rash, facial/tongue/throat swelling, SOB or lightheadedness with hypotension: no Has patient had a PCN reaction causing severe rash involving mucus membranes or skin necrosis: yes - blisters Has patient had a PCN reaction that required hospitalization no Has patient had a PCN reaction occurring within the last 10 years: no If all of the above answers are "NO", then may proceed with Cephalosporin use.   Gwynneth Albright [Brinzolamide-Brimonidine] Swelling    Eyes swell shut    The neurologically relevant items on the patient's problem list were reviewed on today's visit.  Neurologic Examination  A problem focused neurological exam (12 or more points of the single system neurologic examination, vital signs counts as 1 point, cranial nerves count for 8 points) was performed.  Blood pressure 131/77, pulse 77, height 5\' 4"  (1.626 m), weight 134 lb 6.4 oz (61 kg).  General - Well nourished, well developed, in no apparent distress.  Ophthalmologic - Fundi not visualized due to noncooperation.  Cardiovascular - Regular rate and rhythm with no murmur.  Mental Status -  Level of arousal and orientation to time, date, place, and person were intact. Language including expression, naming, comprehension was assessed and found intact, however, mild dysarthria.   Cranial Nerves II - XII - II - Visual field intact  OU, however seems to have right lower quadrant simutagnosia. III, IV, VI - Extraocular movements intact. V - Facial sensation intact bilaterally. VII - mild left facial droop. VIII - Hearing & vestibular intact bilaterally. X - Palate elevates symmetrically, mild dysarthria. XI - Chin turning & shoulder shrug intact  bilaterally. XII - Tongue protrusion intact.  Motor Strength - The patient's strength was normal in RUE and RLE, but LUE 4+/5 and LLE 4/5 with leg brace and pronator drift was absent.  Bulk was normal and fasciculations were absent.   Motor Tone - Muscle tone was assessed at the neck and appendages and was normal.  Reflexes - The patient's reflexes were 1+ in all extremities and she had no pathological reflexes.  Sensory - Light touch, temperature/pinprick were assessed and were normal.    Coordination - The patient had normal movements in the hands and feet with no ataxia or dysmetria.  Tremor was absent.  Gait and Station - walk with walker and left hemiparetic gait on AFO.   Data reviewed: I personally reviewed the images and agree with the radiology interpretations.  Ct Head Wo Contrast 09/26/2015   No acute intracranial pathology. If symptoms persist and there are no contraindications,   Mri and Mra Head Wo Contrast 09/27/2015  MRI HEAD  1. 16 mm acute ischemic nonhemorrhagic infarct involving the posterior right corona radiata. No associated mass effect.  2. Multiple remote lacunar infarcts involving the bilateral external capsules/basal ganglia, bilateral thalami, and the right paramedian pons.  3. Mild chronic small vessel ischemic disease.  4. Acute left maxillary sinusitis.  MRA HEAD  1. Negative for large vessel occlusion.  2. Atheromatous irregularity with superimposed mild to moderate multi focal stenoses. Overall, these changes have progressed from prior.  3. Extensive atheromatous irregularity with stenoses involving the PCAs bilaterally, also progressed from prior.  4. Moderate multifocal atheromatous irregularity with stenoses involving the MCA branches bilaterally, left greater than right.  5. 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  Attention at follow-up recommended.   Dg Chest Port 1 View 09/26/2015   1. Shallow lung inflation with patchy and hazy bibasilar opacities, right greater than left. Atelectasis is favored. Superimposed infection, particularly at the right lung base, could be considered in the correct clinical setting. 2. Aortic atherosclerosis.  CUS - Bilateral: 1-39% ICA stenosis. Vertebral artery flow is antegrade.  TTE - - Normal LV size with EF 55%. Moderate diastolic dysfunction.  Normal RV size and systolic function. No significant valvular  abnormalities.  Component     Latest Ref Rng & Units 09/27/2015  Cholesterol     0 - 200 mg/dL 161 (H)  Triglycerides     <150 mg/dL 096  HDL Cholesterol     >40 mg/dL 40 (L)  Total CHOL/HDL Ratio     RATIO 7.1  VLDL     0 - 40 mg/dL 29  LDL (calc)     0 - 99 mg/dL 045 (H)  Hemoglobin W0J     4.8 - 5.6 % 5.3  Mean Plasma Glucose     mg/dL 811    Assessment: As you may recall, she is a 61 y.o. African American female with PMH of depression, PTSD, alcohol abuse, smoker, previous stroke and GI hemorrhage admitted on 09/26/15 for right CR infarct but also old BG, thalamus and pontine infarcts on MRI, all  consistent with small vessel disease. MRA, CUS, TTE, and A1C unremarkable. LDL 215. She was on ASA in the past, but took off due to GIB caused by gastric ulcer. She was put on plavix and lipitor 80 for stroke prevention and discharged to CIR. During the interval time, left sided weakness much improved and able to walk with cane at home. Following with Dr. Wynn BankerKirsteins for botox.   Plan:  - continue plavix and lipitor for stroke prevention - stop smoking - Follow up with your primary care physician for stroke risk factor modification. Recommend maintain blood pressure goal <130/80, diabetes with hemoglobin A1c goal below 7.0% and lipids with LDL cholesterol goal below 70 mg/dL.  - check BP at home and record - continue outpt PT/OT and aggressive home  exercise  - walk with cane at home and avoid fall - healthy diet - follow up with Dr. Wynn BankerKirsteins for botox injection - follow up in 6 months with Eber Jonesarolyn or Aundra MilletMegan.   I spent more than 25 minutes of face to face time with the patient. Greater than 50% of time was spent in counseling and coordination of care. We discussed aggressive home exercise, follow up with Dr. Wynn BankerKirsteins and quit smoking.   No orders of the defined types were placed in this encounter.   No orders of the defined types were placed in this encounter.   Patient Instructions  - continue plavix and lipitor for stroke prevention - stop smoking - Follow up with your primary care physician for stroke risk factor modification. Recommend maintain blood pressure goal <130/80, diabetes with hemoglobin A1c goal below 7.0% and lipids with LDL cholesterol goal below 70 mg/dL.  - check BP at home and record - continue outpt PT/OT and home exercise  - walk with cane and avoid fall - healthy diet - follow up with Dr. Wynn BankerKirsteins for botox injection - follow up in 6 months.    Marvel PlanJindong Arinze Rivadeneira, MD PhD [facility] [address], [address] [place], [place] [id number] [phone]

## 2016-04-18 NOTE — Patient Instructions (Addendum)
-   continue plavix and lipitor for stroke prevention - stop smoking - Follow up with your primary care physician for stroke risk factor modification. Recommend maintain blood pressure goal <130/80, diabetes with hemoglobin A1c goal below 7.0% and lipids with LDL cholesterol goal below 70 mg/dL.  - check BP at home and record - continue outpt PT/OT and home exercise  - walk with cane and avoid fall - healthy diet - follow up with Dr. Wynn BankerKirsteins for botox injection - follow up in 6 months.

## 2016-05-03 ENCOUNTER — Ambulatory Visit: Payer: Medicaid Other | Admitting: Physical Medicine & Rehabilitation

## 2016-05-09 ENCOUNTER — Ambulatory Visit: Payer: Medicaid Other | Attending: Neurology | Admitting: Rehabilitation

## 2016-05-09 ENCOUNTER — Encounter: Payer: Self-pay | Admitting: Rehabilitation

## 2016-05-09 DIAGNOSIS — R2681 Unsteadiness on feet: Secondary | ICD-10-CM | POA: Insufficient documentation

## 2016-05-09 DIAGNOSIS — R293 Abnormal posture: Secondary | ICD-10-CM | POA: Insufficient documentation

## 2016-05-09 DIAGNOSIS — R2689 Other abnormalities of gait and mobility: Secondary | ICD-10-CM | POA: Diagnosis present

## 2016-05-09 DIAGNOSIS — I69352 Hemiplegia and hemiparesis following cerebral infarction affecting left dominant side: Secondary | ICD-10-CM

## 2016-05-09 NOTE — Therapy (Signed)
[facility] [address] [place], [place], [address] Phone: [phone]   Fax:  [phone]  Physical Therapy Treatment  Patient Details  Name: [name] MRN: [id number] Date of Birth: 09/02/1955 Referring Provider: Rosalin Hawking, MD   Encounter Date: 05/09/2016      PT End of Session - 05/09/16 2000    Visit Number 3   Number of Visits 4  3 visits plus eval   Date for PT Re-Evaluation 06/22/16   Authorization Type MCD approved 3 visits from 1/26-4/19/18)   Authorization - Visit Number 2   Authorization - Number of Visits 3   PT Start Time 1146   PT Stop Time 1232   PT Time Calculation (min) 46 min   Activity Tolerance Patient tolerated treatment well   Behavior During Therapy [facility] for tasks assessed/performed      Past Medical History:  Diagnosis Date  . Alcohol abuse   . Chronic back pain   . Colitis   . CVA (cerebral infarction)   . Fatty liver   . Gastric ulcer   . GI bleed   . Hepatitis    Hepatitis C  . MDD (major depressive disorder)   . Pancreatitis   . PTSD (post-traumatic stress disorder)   . Seizures ([place])   . Spastic hemiplegia affecting left dominant side ([place]) 11/05/2015  . Stroke ([place])   . UTI (lower urinary tract infection)   . Vision abnormalities     Past Surgical History:  Procedure Laterality Date  . ABDOMINAL HYSTERECTOMY    . APPENDECTOMY    . COLONOSCOPY WITH PROPOFOL N/A 08/13/2015   Procedure: COLONOSCOPY WITH PROPOFOL;  Surgeon: Milus Banister, MD;  Location: [facility] ENDOSCOPY;  Service: Endoscopy;  Laterality: N/A;  . ESOPHAGOGASTRODUODENOSCOPY N/A 05/23/2014   Procedure: ESOPHAGOGASTRODUODENOSCOPY (EGD);  Surgeon: Jerene Bears, MD;  Location: [facility] ENDOSCOPY;  Service: Endoscopy;  Laterality: N/A;  . ESOPHAGOGASTRODUODENOSCOPY (EGD) WITH PROPOFOL N/A 08/13/2015   Procedure: ESOPHAGOGASTRODUODENOSCOPY (EGD) WITH PROPOFOL;  Surgeon: Milus Banister, MD;  Location: [facility] ENDOSCOPY;   Service: Endoscopy;  Laterality: N/A;  . ESOPHAGOGASTRODUODENOSCOPY ENDOSCOPY    . gall stone removed      There were no vitals filed for this visit.      Subjective Assessment - 05/09/16 1151    Subjective "The exercises are going okay."    Limitations House hold activities;Walking;Standing   Patient Stated Goals "I want to get stronger and walk better."    Currently in Pain? No/denies           NMR:  Performed NMR tasks for improved postural control and orientation, improved LLE activation, improved L lateral weight shift with R forward/retro stepping and stepping R LE to/from 4" step, gait with and without device to carryover from NMR tasks.  Performed several mid range squat positions while shifting R and L, standing advancing RLE with emphasis on increased L lateral trunk lean and head tilt as she tends to keep trunk shortened on the R and head tilted to the R.  With MAX tactile and verbal cues, was able to get pt in upright position during NMR stepping tasks, therefore transitioned to gait without AD.  Provided light L HHA with facilitation at trunk for increased lengthening on the L and intermittently at head for increased L tilt.  Immediately noted improved L foot clearance and weight shift onto LLE.  Intermittent facilitation at L hip for increased protraction.  Ended session with gait with RW with emphasis  on same deficits mentioned above.  Again, noted marked improvement in balance, postural control, LLE clearance and decreased tone.    Note that all NMR done with mirror for increased visual feedback on posture and discussed [name] with pt and niece at end of session due to visit limitations from MCD.  Both verbalized understanding.                        PT Education - 05/09/16 1151    Education provided Yes   Education Details focusing on slower gait speed, upright posture and education on [name] following therapy.    Person(s)  Educated Patient;Other (comment)  niece   Methods Explanation   Comprehension Verbalized understanding          PT Short Term Goals - 04/11/16 1312      PT SHORT TERM GOAL #1   Title Pt will initiate HEP in order to indicate improved functional mobility and decreased fall risk.  (Target Date:  following first visit)   Baseline met 04/11/16   Status Achieved     PT SHORT TERM GOAL #2   Title Pt will demonstrate compliance with use of RW and appropriate brace to best control R foot drag and improve safety.     Baseline met 04/11/16   Status Achieved           PT Long Term Goals - 03/24/16 1632      PT LONG TERM GOAL #1   Title Pt will be independent with HEP in order to indicate improved functional mobility and decreased fall risk.  (Target Date:  Following 3rd visit)   Baseline dependent     PT LONG TERM GOAL #2   Title Pt will perform 5TSS in less than 20 secs with single UE support only in order to indicate improved functional strenth.     Baseline 25.78 secs with BUE support     PT LONG TERM GOAL #3   Title Pt will ambulate 300' with RW and AFO at mod I level over indoor surfaces in order to indicate safe home negotiation.     Baseline 115' with rollator and AFO at min/guard level     PT LONG TERM GOAL #4   Title Pt will ambulate 500' over unlevel paved outdoor surfaces (including ramp and curb step) w/ RW and AFO at S level in order to indicate safe return to community.     Baseline unable to assess outdoor gait due to time               Plan - 05/09/16 2000    Clinical Impression Statement Skilled session focused on NMR for improved postural control and orientation in standing, improved weight shift onto LLE in standing and carrying all over to gait with and without AD.  Note marked improvement during session with repeated blocked practice of task.    Rehab Potential Good   Clinical Impairments Affecting Rehab Potential time out from CVA and visit limitations  per MCD   PT Frequency [place]   PT Duration Other (comment)  over 3 months   PT Treatment/Interventions ADLs/Self Care Home Management;Electrical Stimulation;DME Instruction;Gait training;Stair training;Functional mobility training;Therapeutic activities;Therapeutic exercise;Balance training;Neuromuscular re-education;Patient/family education;Orthotic Fit/Training;Energy conservation;Vestibular   PT Next Visit Plan LLE NMR, check goals, D/C   Consulted and Agree with Plan of Care Patient      Patient will benefit from skilled therapeutic intervention in order to improve the following deficits  and impairments:  Abnormal gait, Decreased activity tolerance, Decreased balance, Decreased cognition, Decreased coordination, Decreased endurance, Decreased knowledge of precautions, Decreased knowledge of use of DME, Decreased mobility, Decreased safety awareness, Decreased strength, Difficulty walking, Impaired perceived functional ability, Impaired sensation, Impaired tone, Improper body mechanics, Postural dysfunction  Visit Diagnosis: Hemiplegia and hemiparesis following cerebral infarction affecting left dominant side (HCC)  Unsteadiness on feet  Other abnormalities of gait and mobility  Abnormal posture     Problem List Patient Active Problem List   Diagnosis Date Noted  . Former smoker 01/11/2016  . 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 Left hip pain   . History of GI bleed   . Spastic bladder   . Chronic back pain   . ETOH abuse   . Hypokalemia   . PTSD (post-traumatic stress disorder)   . Gastroesophageal reflux disease without esophagitis   . Acute ischemic stroke ([place]) 09/27/2015  . Acute CVA (cerebrovascular accident) ([place]) 09/27/2015  . Smoker   . Left-sided weakness 09/26/2015  . Acute gastric ulcer   . GI bleed 05/22/2014  . Acute GI bleeding 05/22/2014  . Acute blood loss anemia 05/22/2014  . History of stroke  05/22/2014  . Mixed hyperlipidemia 05/22/2014  . Gastrointestinal hemorrhage with melena   . 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 cerebral artery ([place]) 10/09/2012  . Flank pain 10/09/2012  . Right pontine stroke ([place]) 10/09/2012  . ABDOMINAL PAIN OTHER SPECIFIED SITE 05/17/2010  . ALCOHOL ABUSE 06/16/2007  . COCAINE ABUSE 06/16/2007  . DEPRESSION 06/16/2007  . Essential hypertension 06/16/2007  . CEREBROVASCULAR ACCIDENT 06/16/2007  . ASTHMA 06/16/2007  . GERD 06/16/2007  . PANCREATITIS 11/09/2006  . ESOPHAGITIS, REFLUX 05/23/2006    Nilam Quakenbush, Betha Loa 05/09/2016, 8:02 PM  [facility] [address] [place], [place], [address] Phone: [phone]   Fax:  [phone]  Name: [name] MRN: [id number] Date of Birth: 1955-08-07

## 2016-05-16 ENCOUNTER — Encounter: Payer: Self-pay | Admitting: Physical Medicine & Rehabilitation

## 2016-05-16 ENCOUNTER — Encounter: Payer: Medicaid Other | Attending: Physical Medicine & Rehabilitation

## 2016-05-16 ENCOUNTER — Ambulatory Visit (HOSPITAL_BASED_OUTPATIENT_CLINIC_OR_DEPARTMENT_OTHER): Payer: Medicaid Other | Admitting: Physical Medicine & Rehabilitation

## 2016-05-16 VITALS — BP 134/82 | HR 80

## 2016-05-16 DIAGNOSIS — I69352 Hemiplegia and hemiparesis following cerebral infarction affecting left dominant side: Secondary | ICD-10-CM | POA: Diagnosis present

## 2016-05-16 DIAGNOSIS — G8929 Other chronic pain: Secondary | ICD-10-CM | POA: Insufficient documentation

## 2016-05-16 DIAGNOSIS — R269 Unspecified abnormalities of gait and mobility: Secondary | ICD-10-CM | POA: Diagnosis not present

## 2016-05-16 DIAGNOSIS — G8112 Spastic hemiplegia affecting left dominant side: Secondary | ICD-10-CM | POA: Diagnosis not present

## 2016-05-16 DIAGNOSIS — K760 Fatty (change of) liver, not elsewhere classified: Secondary | ICD-10-CM | POA: Insufficient documentation

## 2016-05-16 DIAGNOSIS — I639 Cerebral infarction, unspecified: Secondary | ICD-10-CM | POA: Diagnosis not present

## 2016-05-16 DIAGNOSIS — I638 Other cerebral infarction: Secondary | ICD-10-CM | POA: Insufficient documentation

## 2016-05-16 DIAGNOSIS — F1010 Alcohol abuse, uncomplicated: Secondary | ICD-10-CM | POA: Insufficient documentation

## 2016-05-16 DIAGNOSIS — Z87440 Personal history of urinary (tract) infections: Secondary | ICD-10-CM | POA: Insufficient documentation

## 2016-05-16 DIAGNOSIS — Z720 Tobacco use: Secondary | ICD-10-CM | POA: Diagnosis not present

## 2016-05-16 DIAGNOSIS — K5900 Constipation, unspecified: Secondary | ICD-10-CM | POA: Diagnosis not present

## 2016-05-16 DIAGNOSIS — M545 Low back pain: Secondary | ICD-10-CM | POA: Diagnosis not present

## 2016-05-16 DIAGNOSIS — N3289 Other specified disorders of bladder: Secondary | ICD-10-CM | POA: Insufficient documentation

## 2016-05-16 DIAGNOSIS — K859 Acute pancreatitis without necrosis or infection, unspecified: Secondary | ICD-10-CM | POA: Insufficient documentation

## 2016-05-16 DIAGNOSIS — IMO0002 Reserved for concepts with insufficient information to code with codable children: Secondary | ICD-10-CM

## 2016-05-16 DIAGNOSIS — F4310 Post-traumatic stress disorder, unspecified: Secondary | ICD-10-CM | POA: Insufficient documentation

## 2016-05-16 DIAGNOSIS — F329 Major depressive disorder, single episode, unspecified: Secondary | ICD-10-CM | POA: Diagnosis not present

## 2016-05-16 DIAGNOSIS — Z5189 Encounter for other specified aftercare: Secondary | ICD-10-CM | POA: Insufficient documentation

## 2016-05-16 NOTE — Progress Notes (Signed)
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  Informed consent was obtained after describing the risks and benefits of the procedure with the patient this includes bleeding bruising and infection as well as medication side effects. The patient elected to proceed and has given written consent. Patient placed in a prone position on the exam table. External DC stimulation was applied to the popliteal space using a nerve stimulator. Plantar flexion twitch was obtained. The popliteal region was prepped with Betadine and then entered with a 22-gauge 40 mm needle electrode under electrical stimulation guidance. Plantar flexion which was obtained and confirmed. Then 4 cc of 5% phenol were injected. The patient tolerated procedure well. Post procedure instructions and followup visit were given. 

## 2016-06-02 ENCOUNTER — Ambulatory Visit (HOSPITAL_BASED_OUTPATIENT_CLINIC_OR_DEPARTMENT_OTHER): Payer: Medicaid Other | Admitting: Physical Medicine & Rehabilitation

## 2016-06-02 ENCOUNTER — Encounter: Payer: Self-pay | Admitting: Physical Medicine & Rehabilitation

## 2016-06-02 VITALS — BP 154/79 | HR 69 | Resp 14

## 2016-06-02 DIAGNOSIS — IMO0002 Reserved for concepts with insufficient information to code with codable children: Secondary | ICD-10-CM

## 2016-06-02 DIAGNOSIS — I639 Cerebral infarction, unspecified: Secondary | ICD-10-CM

## 2016-06-02 DIAGNOSIS — G8112 Spastic hemiplegia affecting left dominant side: Secondary | ICD-10-CM | POA: Diagnosis not present

## 2016-06-02 DIAGNOSIS — I638 Other cerebral infarction: Secondary | ICD-10-CM | POA: Diagnosis not present

## 2016-06-02 NOTE — Patient Instructions (Signed)
Follow up with Dr Pearlean BrownieSethi [facility]  All meds from Dr Mikeal HawthorneGarba

## 2016-06-02 NOTE — Progress Notes (Signed)
Subjective:    Patient ID: [name], female    DOB: [birth year], 61 y.o.   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 She wears an AFO. She has trialed botulinum toxin injection 01/02/2016 FHL 75 FDL 75 MEDGASTROC 25 SOLEUS 25  In November, patient stated the injection was not helpful. 05/16/2016 underwent phenol, neural lysis, left tibial nerve without beneficial effect on spasticity.  Pain Inventory Average Pain 10 Pain Right Now 8 My pain is aching  In the last 24 hours, has pain interfered with the following? General activity 6 Relation with others 6 Enjoyment of life 1 What TIME of day is your pain at its worst? morning, night Sleep (in general) Poor  Pain is worse with: standing Pain improves with: injections Relief from Meds: 2  Mobility walk with assistance use a walker how many minutes can you walk? 10 ability to climb steps?  yes do you drive?  no transfers alone Do you have any goals in this area?  yes  Function disabled: date disabled . Do you have any goals in this area?  yes  Neuro/Psych trouble walking  Prior Studies Any changes since last visit?  no  Physicians involved in your care Any changes since last visit?  no   Family History  Problem Relation Age of Onset  . Stroke Mother   . Cancer Sister     type unknown  . Diabetes Father    Social History   Social History  . Marital status: Widowed    Spouse name: N/A  . Number of children: 0  . Years of education: N/A   Social History Main Topics  . Smoking status: Former Smoker    Packs/day: 0.50    Types: Cigarettes    Quit date: 09/26/2015  . Smokeless tobacco: Former NeurosurgeonUser  . Alcohol use No  . Drug use: No  . Sexual activity: Not Asked   Other Topics Concern  . None   Social History Narrative  . None   Past  Surgical History:  Procedure Laterality Date  . ABDOMINAL HYSTERECTOMY    . APPENDECTOMY    . COLONOSCOPY WITH PROPOFOL N/A 08/13/2015   Procedure: COLONOSCOPY WITH PROPOFOL;  Surgeon: Rachael Feeaniel P Jacobs, MD;  Location: [facility];  Service: Endoscopy;  Laterality: N/A;  . ESOPHAGOGASTRODUODENOSCOPY N/A 05/23/2014   Procedure: ESOPHAGOGASTRODUODENOSCOPY (EGD);  Surgeon: Beverley FiedlerJay M Pyrtle, MD;  [facility];  Service: Endoscopy;  Laterality: N/A;  . ESOPHAGOGASTRODUODENOSCOPY (EGD) WITH PROPOFOL N/A 08/13/2015   Procedure: ESOPHAGOGASTRODUODENOSCOPY (EGD) WITH PROPOFOL;  Surgeon: Rachael Feeaniel P Jacobs, MD;  Location: [facility];  Service: Endoscopy;  Laterality: N/A;  . ESOPHAGOGASTRODUODENOSCOPY ENDOSCOPY    . gall stone removed     Past Medical History:  Diagnosis Date  . Alcohol abuse   . Chronic back pain   . Colitis   . CVA (cerebral infarction)   . Fatty liver   . Gastric ulcer   . GI bleed   . Hepatitis    Hepatitis C  . MDD (major depressive disorder)   . Pancreatitis   . PTSD (post-traumatic stress disorder)   . Seizures (HCC)   . 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 Stroke (HCC)   . UTI (lower urinary tract infection)   . Vision abnormalities    BP (!) 154/79   Pulse 69  Resp 14   SpO2 95%   Opioid Risk Score:   Fall Risk Score:  `1  Depression screen PHQ 2/9  Depression screen PHQ 2/9 11/05/2015  Decreased Interest 0  Down, Depressed, Hopeless 0  PHQ - 2 Score 0  Altered sleeping 0  Tired, decreased energy 2  Change in appetite 0  Feeling bad or failure about yourself  0  Trouble concentrating 0  Moving slowly or fidgety/restless 0  Suicidal thoughts 0  PHQ-9 Score 2  Difficult doing work/chores Not difficult at all    Review of Systems  Constitutional: Negative.   HENT: Negative.   Eyes: Negative.   Respiratory: Negative.   Cardiovascular: Negative.   Gastrointestinal: Negative.   Endocrine: Negative.     Genitourinary: Negative.   Musculoskeletal: Positive for gait problem.  Skin: Negative.   Allergic/Immunologic: Negative.   Hematological: Negative.   Psychiatric/Behavioral: Negative.   All other systems reviewed and are negative.      Objective:   Physical Exam  Constitutional: She is oriented to person, place, and time. She appears well-developed and well-nourished.  HENT:  Head: Normocephalic and atraumatic.  Eyes: Conjunctivae and EOM are normal. Pupils are equal, round, and reactive to light.  Neurological: She is alert and oriented to person, place, and time.  Psychiatric: She has a normal mood and affect.  Nursing note and vitals reviewed.   Motor strength is 4 minus in the left deltoid, biceps, triceps, grip 4. At the left hip flexor, knee extensor and 3 minus, ankle dorsiflexor. She ambulates with a walker and AFO. Stifflegged gait. Speech with mild dysarthria      Assessment & Plan:  1. 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 She is greater than 6 months post. I do think her deficits are permanent. Has already completed both inpatient and outpatient rehabilitation. Would recommend continue home exercise program. Recommended follow-up with neurology. Recommend follow up with internal medicine, Dr. Mikeal Hawthorne will be prescribing all of her medications.

## 2016-06-06 ENCOUNTER — Ambulatory Visit: Payer: Medicaid Other | Attending: Neurology | Admitting: Rehabilitation

## 2016-07-11 ENCOUNTER — Encounter: Payer: Self-pay | Admitting: Nurse Practitioner

## 2016-09-05 ENCOUNTER — Encounter: Payer: Self-pay | Admitting: Rehabilitation

## 2016-09-05 NOTE — Therapy (Signed)
[facility] [address] [place], [place], [address] Phone: [phone]   Fax:  [phone]  Patient Details  Name: [name] MRN: [id number] Date of Birth: [birth year] Referring Provider:  No ref. provider found  Encounter Date: 09/05/2016   PHYSICAL THERAPY DISCHARGE SUMMARY  Visits from Start of Care: 3  Current functional level related to goals / functional outcomes:     PT Long Term Goals - 03/24/16 1632      PT LONG TERM GOAL #1   Title Pt will be independent with HEP in order to indicate improved functional mobility and decreased fall risk.  (Target Date:  Following 3rd visit)   Baseline dependent     PT LONG TERM GOAL #2   Title Pt will perform 5TSS in less than 20 secs with single UE support only in order to indicate improved functional strenth.     Baseline 25.78 secs with BUE support     PT LONG TERM GOAL #3   Title Pt will ambulate 300' with RW and AFO at mod I level over indoor surfaces in order to indicate safe home negotiation.     Baseline 115' with rollator and AFO at min/guard level     PT LONG TERM GOAL #4   Title Pt will ambulate 500' over unlevel paved outdoor surfaces (including ramp and curb step) w/ RW and AFO at S level in order to indicate safe return to community.     Baseline unable to assess outdoor gait due to time        Remaining deficits: Unsure as she did not return for last visit in [facility]CD approved time.    Education / Equipment: HEP  Plan: Patient agrees to discharge.  Patient goals were not met. Patient is being discharged due to not returning since the last visit.  ?????         Cameron Sprang, PT, MPT [facility] [address] [place], [place], [address] Phone: [phone]   Fax:  [phone] 09/05/16, 8:24 AM

## 2016-10-17 ENCOUNTER — Ambulatory Visit: Payer: Medicaid Other | Admitting: Nurse Practitioner

## 2016-10-31 NOTE — Progress Notes (Deleted)
[facility]  PATIENT: [name] DOB: [birth year]   REASON FOR VISIT: Follow-up for stroke  HISTORY FROM:    HISTORY OF PRESENT ILLNESS:History Summary Ms.Alexa McAdoois a 61 y.o.femalewith history of depression, PTSD, alcohol abuse, smoker, previous stroke and GI hemorrhage admitted on 09/26/15 for newonset left-sided weakness, slurred speechandleft facial droop. MRI showed right CR infarct but also old BG, thalamus and pontine infarcts, all consistent with small vessel disease. MRA, CUS, TTE, and A1C unremarkable. LDL 215. She was on ASA in the past, but took off due to GIB caused by gastric ulcer. Therefore, she was put on plavix and lipitor 80 for stroke prevention. She was later discharged to CIR.  01/11/16 follow up - the patient has been doing better. Left sided weakness much improved and able to walk with walker. Finished with home PT/OT and will start with outpt PT/OT soon. She has quit smoking since the stroke. Followed with PCP last month. On plavix and lipitor without side effect. BP today 104/67.     Interval History During the interval time, pt has been doing well. No recurrent stroke. Followed with Dr. Wynn Banker for left sided hemiparesis and received botox injection at left foot. Able to walk with cane at home although came in today with walker. However, she started to smoke again around last Christmas. BP 131/77 today, taking lisinopril at home.  ***  REVIEW OF SYSTEMS: Full 14 system review of systems performed and notable only for those listed, all others are neg:  Constitutional: neg  Cardiovascular: neg Ear/Nose/Throat: neg  Skin: neg Eyes: neg Respiratory: neg Gastroitestinal: neg  Hematology/Lymphatic: neg  Endocrine: neg Musculoskeletal:neg Allergy/Immunology: neg Neurological: neg Psychiatric: neg Sleep : neg   ALLERGIES: Allergies  Allergen Reactions  . Banana Other (See Comments)    Welts on skin and sores in mouth  .  Penicillins Other (See Comments)    Blister Has patient had a PCN reaction causing immediate rash, facial/tongue/throat swelling, SOB or lightheadedness with hypotension: no Has patient had a PCN reaction causing severe rash involving mucus membranes or skin necrosis: yes - blisters Has patient had a PCN reaction that required hospitalization no Has patient had a PCN reaction occurring within the last 10 years: no If all of the above answers are "NO", then may proceed with Cephalosporin use.   [name] [Brinzolamide-Brimonidine] Swelling    Eyes swell shut    HOME MEDICATIONS: Outpatient Medications Prior to Visit  Medication Sig Dispense Refill  . albuterol (PROVENTIL HFA;VENTOLIN HFA) 108 (90 BASE) MCG/ACT inhaler Inhale 2 puffs into the lungs every 8 (eight) hours as needed for wheezing or shortness of breath.    Marland Kitchen atorvastatin (LIPITOR) 80 MG tablet Take 1 tablet (80 mg total) by mouth daily at 6 PM. 30 tablet 0  . clopidogrel (PLAVIX) 75 MG tablet Take 1 tablet (75 mg total) by mouth daily. 30 tablet 0  . cycloSPORINE (RESTASIS) 0.05 % ophthalmic emulsion Place 1 drop into both eyes 2 (two) times daily. 0.4 mL 0  . famotidine (PEPCID) 20 MG tablet Take 1 tablet (20 mg total) by mouth 2 (two) times daily. 60 tablet 0  . fluticasone (FLONASE) 50 MCG/ACT nasal spray Place 1 spray into the nose daily as needed for allergies.   2  . gabapentin (NEURONTIN) 300 MG capsule Take 1 capsule (300 mg total) by mouth 3 (three) times daily. 90 capsule 1  . oxybutynin (DITROPAN-XL) 5 MG 24 hr tablet TAKE 1 TABLET BY MOUTH AT BEDTIME.  90 tablet 0  . pantoprazole (PROTONIX) 40 MG tablet Take 1 tablet (40 mg total) by mouth 2 (two) times daily before a meal. 60 tablet 0  . rOPINIRole (REQUIP) 0.25 MG tablet Take 1 tablet (0.25 mg total) by mouth 3 (three) times daily. 90 tablet 0  . senna-docusate (SENOKOT-S) 8.6-50 MG tablet Take 1 tablet by mouth 2 (two) times daily. 60 tablet 1  . sertraline  (ZOLOFT) 50 MG tablet Take 1 tablet (50 mg total) by mouth daily. 30 tablet 0  . temazepam (RESTORIL) 15 MG capsule Take 15 mg by mouth at bedtime.    . traMADol-acetaminophen (ULTRACET) 37.5-325 MG tablet Take 1 tablet by mouth every 4 (four) hours as needed for moderate pain. 30 tablet 0  . Travoprost, BAK Free, (TRAVATAN) 0.004 % SOLN ophthalmic solution Place 1 drop into both eyes at bedtime.      No facility-administered medications prior to visit.     PAST MEDICAL HISTORY: Past Medical History:  Diagnosis Date  . Alcohol abuse   . Chronic back pain   . Colitis   . CVA (cerebral infarction)   . Fatty liver   . Gastric ulcer   . GI bleed   . Hepatitis    Hepatitis C  . MDD (major depressive disorder)   . Pancreatitis   . PTSD (post-traumatic stress disorder)   . Seizures (HCC)   . 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 Stroke (HCC)   . UTI (lower urinary tract infection)   . Vision abnormalities     PAST SURGICAL HISTORY: Past Surgical History:  Procedure Laterality Date  . ABDOMINAL HYSTERECTOMY    . APPENDECTOMY    . COLONOSCOPY WITH PROPOFOL N/A 08/13/2015   Procedure: COLONOSCOPY WITH PROPOFOL;  Surgeon: Rachael Fee, MD;  Location: [facility];  Service: Endoscopy;  Laterality: N/A;  . ESOPHAGOGASTRODUODENOSCOPY N/A 05/23/2014   Procedure: ESOPHAGOGASTRODUODENOSCOPY (EGD);  Surgeon: Beverley Fiedler, MD;  Location: [facility] ENDOSCOPY;  Service: Endoscopy;  Laterality: N/A;  . ESOPHAGOGASTRODUODENOSCOPY (EGD) WITH PROPOFOL N/A 08/13/2015   Procedure: ESOPHAGOGASTRODUODENOSCOPY (EGD) WITH PROPOFOL;  Surgeon: Rachael Fee, MD;  Location: [facility];  Service: Endoscopy;  Laterality: N/A;  . ESOPHAGOGASTRODUODENOSCOPY ENDOSCOPY    . gall stone removed      FAMILY HISTORY: Family History  Problem Relation Age of Onset  . Stroke Mother   . Cancer Sister        type unknown  . Diabetes Father     SOCIAL HISTORY: Social History   Social  History  . Marital status: Widowed    Spouse name: N/A  . Number of children: 0  . Years of education: N/A   Occupational History  . Not on file.   Social History Main Topics  . Smoking status: Former Smoker    Packs/day: 0.50    Types: Cigarettes    Quit date: 09/26/2015  . Smokeless tobacco: Former Neurosurgeon  . Alcohol use No  . Drug use: No  . Sexual activity: Not on file   Other Topics Concern  . Not on file   Social History Narrative  . No narrative on file     PHYSICAL EXAM  There were no vitals filed for this visit. There is no height or weight on file to calculate BMI.  Generalized: Well developed, in no acute distress  Head: normocephalic and atraumatic,. Oropharynx benign  Neck: Supple, no carotid bruits  Cardiac: Regular rate rhythm, no murmur  Musculoskeletal: No deformity  Neurological examination   Mentation: Alert oriented to time, place, history taking. Attention span and concentration appropriate. Recent and remote memory intact.  Follows all commands speech and language fluent.   Cranial nerve II-XII: Fundoscopic exam reveals sharp disc margins.Pupils were equal round reactive to light extraocular movements were full, visual field were full on confrontational test. Facial sensation and strength were normal. hearing was intact to finger rubbing bilaterally. Uvula tongue midline. head turning and shoulder shrug were normal and symmetric.Tongue protrusion into cheek strength was normal. Motor: normal bulk and tone, full strength in the BUE, BLE, fine finger movements normal, no pronator drift. No focal weakness Sensory: normal and symmetric to light touch, pinprick, and  Vibration, proprioception  Coordination: finger-nose-finger, heel-to-shin bilaterally, no dysmetria Reflexes: Brachioradialis 2/2, biceps 2/2, triceps 2/2, patellar 2/2, Achilles 2/2, plantar responses were flexor bilaterally. Gait and Station: Rising up from seated position without  assistance, normal stance,  moderate stride, good arm swing, smooth turning, able to perform tiptoe, and heel walking without difficulty. Tandem gait is steady  DIAGNOSTIC DATA (LABS, IMAGING, TESTING) - I reviewed patient records, labs, notes, testing and imaging myself where available.  Lab Results  Component Value Date   WBC 7.2 12/12/2015   HGB 13.0 12/12/2015   HCT 39.9 12/12/2015   MCV 91.9 12/12/2015   PLT 169 12/12/2015      Component Value Date/Time   NA 138 12/12/2015 0750   K 3.5 12/12/2015 0750   CL 112 (H) 12/12/2015 0750   CO2 18 (L) 12/12/2015 0750   GLUCOSE 87 12/12/2015 0750   BUN 13 12/12/2015 0750   CREATININE 1.04 (H) 12/12/2015 0750   CALCIUM 8.4 (L) 12/12/2015 0750   PROT 6.5 12/12/2015 0750   ALBUMIN 3.3 (L) 12/12/2015 0750   AST 28 12/12/2015 0750   ALT 23 12/12/2015 0750   ALKPHOS 74 12/12/2015 0750   BILITOT 0.5 12/12/2015 0750   GFRNONAA 56 (L) 12/12/2015 0750   GFRAA >60 12/12/2015 0750   Lab Results  Component Value Date   CHOL 284 (H) 09/27/2015   HDL 40 (L) 09/27/2015   LDLCALC 215 (H) 09/27/2015   TRIG 144 09/27/2015   CHOLHDL 7.1 09/27/2015   Lab Results  Component Value Date   HGBA1C 5.3 09/27/2015   Lab Results  Component Value Date   VITAMINB12 461 01/01/2009   Lab Results  Component Value Date   TSH 3.043 ***Test methodology is 3rd generation TSH*** 01/01/2009     ASSESSMENT AND PLAN 61 y.o. African American female with PMH of depression, PTSD, alcohol abuse, smoker, previous stroke and GI hemorrhage admitted on 09/26/15 for right CR infarct but also old BG, thalamus and pontine infarcts on MRI, all consistent with small vessel disease. MRA, CUS, TTE, and A1C unremarkable. LDL 215. She was on ASA in the past, but took off due to GIB caused by gastric ulcer. She was put on plavix and lipitor 80 for stroke prevention and discharged to CIR. During the interval time, left sided weakness much improved and able to walk with cane at  home. Following with Dr. Wynn Banker for botox.   Plan:  - continue plavix and lipitor for stroke prevention - stop smoking - Follow up with your primary care physician for stroke risk factor modification. Recommend maintain blood pressure goal <130/80, diabetes with hemoglobin A1c goal below 7.0% and lipids with LDL cholesterol goal below 70 mg/dL.  - check BP at home and record - continue outpt PT/OT and aggressive home exercise  -  walk with cane at home and avoid fall - healthy diet - follow up with Dr. Wynn Banker for botox injection - Nilda Riggs [facility], [facility], APRN  [facility] [address] [place], [place] [address] [phone]

## 2016-11-01 ENCOUNTER — Ambulatory Visit (INDEPENDENT_AMBULATORY_CARE_PROVIDER_SITE_OTHER): Payer: Self-pay | Admitting: Nurse Practitioner

## 2016-11-01 ENCOUNTER — Encounter (INDEPENDENT_AMBULATORY_CARE_PROVIDER_SITE_OTHER): Payer: Self-pay

## 2016-11-01 VITALS — Ht 64.0 in

## 2016-11-01 DIAGNOSIS — I639 Cerebral infarction, unspecified: Secondary | ICD-10-CM

## 2016-11-02 NOTE — Progress Notes (Signed)
[facility]  PATIENT: [name] DOB: [birth year]   REASON FOR VISIT:  Follow-up for stroke HISTORY FROM: Patient and niece [name]    HISTORY OF PRESENT ILLNESS:Ms.[name] a 61 y.o.femalewith history of depression, PTSD, alcohol abuse, smoker, previous stroke and GI hemorrhage admitted on 09/26/15 for newonset left-sided weakness, slurred speechandleft facial droop. MRI showed right CR infarct but also old BG, thalamus and pontine infarcts, all consistent with small vessel disease. MRA, CUS, TTE, and A1C unremarkable. LDL 215. She was on ASA in the past, but took off due to GIB caused by gastric ulcer. Therefore, she was put on plavix and lipitor 80 for stroke prevention. She was later discharged to CIR.  01/11/16 follow up Dr. Roda Shutters- the patient has been doing better. Left sided weakness much improved and able to walk with walker. Finished with home PT/OT and will start with outpt PT/OT soon. She has quit smoking since the stroke. Followed with PCP last month. On plavix and lipitor without side effect. BP today 104/67.     Interval History2/5/18 Dr Roda Shutters During the interval time, pt has been doing well. No recurrent stroke. Followed with Dr. Wynn Banker for left sided hemiparesis and received botox injection at left foot. Able to walk with cane at home although came in today with walker. However, she started to smoke again around last Christmas. BP 131/77 today, taking lisinopril at home.   UPDATE 11/03/2016 CM Ms [name], 61 year old female returns for follow-up with history of stroke 09/26/2015 the onset of left-sided weakness slurred speech and left facial droop. MRI consistent with right CR infarct but also old BG thalamus and pontine infarcts which were consistent with small vessel disease. She had GI bleed from aspirin. She was on Plavix when last seen by Dr. however she was told to get off of her Plavix for Botox injections by Dr. Leslie Andrea  she claims that  he did not resume the medication after the injection. Her rehabilitation therapies have concluded. She is doing her home exercise program, uses AFO to left lower leg and ambulates with a walker. She remains on Lipitor without myalgias. Blood pressure in the office today 144/80. She has  Returned to s smoking strongly encouraged to quit.   She returns for reevaluation REVIEW OF SYSTEMS: Full 14 system review of systems performed and notable only for those listed, all others are neg:  Constitutional: neg  Cardiovascular: neg Ear/Nose/Throat: neg  Skin: neg Eyes: neg Respiratory: neg Gastroitestinal: neg  Hematology/Lymphatic: neg  Endocrine: neg Musculoskeleta Walking difficulty, left shoulder pain Allergy Immunology: neg Neurological: neg PsychiatricDepression Sleep  : neg   ALLERGIES: Allergies  Allergen Reactions  . Banana Other (See Comments)    Welts on skin and sores in mouth  . Penicillins Other (See Comments)    Blister Has patient had a PCN reaction causing immediate rash, facial/tongue/throat swelling, SOB or lightheadedness with hypotension: no Has patient had a PCN reaction causing severe rash involving mucus membranes or skin necrosis: yes - blisters Has patient had a PCN reaction that required hospitalization no Has patient had a PCN reaction occurring within the last 10 years: no If all of the above answers are "NO", then may proceed with Cephalosporin use.   Gwynneth Albright [Brinzolamide-Brimonidine] Swelling    Eyes swell shut    HOME MEDICATIONS: Outpatient Medications Prior to Visit  Medication Sig Dispense Refill  . albuterol (PROVENTIL HFA;VENTOLIN HFA) 108 (90 BASE) MCG/ACT inhaler Inhale 2 puffs into the lungs every 8 (eight)  hours as needed for wheezing or shortness of breath.    Marland Kitchen atorvastatin (LIPITOR) 80 MG tablet Take 1 tablet (80 mg total) by mouth daily at 6 PM. 30 tablet 0  . clopidogrel (PLAVIX) 75 MG tablet Take 1 tablet (75 mg total) by mouth daily.  30 tablet 0  . cycloSPORINE (RESTASIS) 0.05 % ophthalmic emulsion Place 1 drop into both eyes 2 (two) times daily. 0.4 mL 0  . famotidine (PEPCID) 20 MG tablet Take 1 tablet (20 mg total) by mouth 2 (two) times daily. 60 tablet 0  . fluticasone (FLONASE) 50 MCG/ACT nasal spray Place 1 spray into the nose daily as needed for allergies.   2  . gabapentin (NEURONTIN) 300 MG capsule Take 1 capsule (300 mg total) by mouth 3 (three) times daily. 90 capsule 1  . oxybutynin (DITROPAN-XL) 5 MG 24 hr tablet TAKE 1 TABLET BY MOUTH AT BEDTIME. 90 tablet 0  . pantoprazole (PROTONIX) 40 MG tablet Take 1 tablet (40 mg total) by mouth 2 (two) times daily before a meal. 60 tablet 0  . rOPINIRole (REQUIP) 0.25 MG tablet Take 1 tablet (0.25 mg total) by mouth 3 (three) times daily. 90 tablet 0  . senna-docusate (SENOKOT-S) 8.6-50 MG tablet Take 1 tablet by mouth 2 (two) times daily. 60 tablet 1  . sertraline (ZOLOFT) 50 MG tablet Take 1 tablet (50 mg total) by mouth daily. 30 tablet 0  . temazepam (RESTORIL) 15 MG capsule Take 15 mg by mouth at bedtime.    . traMADol-acetaminophen (ULTRACET) 37.5-325 MG tablet Take 1 tablet by mouth every 4 (four) hours as needed for moderate pain. 30 tablet 0  . Travoprost, BAK Free, (TRAVATAN) 0.004 % SOLN ophthalmic solution Place 1 drop into both eyes at bedtime.      No facility-administered medications prior to visit.     PAST MEDICAL HISTORY: Past Medical History:  Diagnosis Date  . Alcohol abuse   . Chronic back pain   . Colitis   . CVA (cerebral infarction)   . Fatty liver   . Gastric ulcer   . GI bleed   . Hepatitis    Hepatitis C  . MDD (major depressive disorder)   . Pancreatitis   . PTSD (post-traumatic stress disorder)   . Seizures (HCC)   . 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 Stroke (HCC)   . UTI (lower urinary tract infection)   . Vision abnormalities     PAST SURGICAL HISTORY: Past Surgical History:  Procedure  Laterality Date  . ABDOMINAL HYSTERECTOMY    . APPENDECTOMY    . COLONOSCOPY WITH PROPOFOL N/A 08/13/2015   Procedure: COLONOSCOPY WITH PROPOFOL;  Surgeon: Rachael Fee, MD;  Location: [facility] ENDOSCOPY;  Service: Endoscopy;  Laterality: N/A;  . ESOPHAGOGASTRODUODENOSCOPY N/A 05/23/2014   Procedure: ESOPHAGOGASTRODUODENOSCOPY (EGD);  Surgeon: Beverley Fiedler, MD;  Location: [facility] ENDOSCOPY;  Service: Endoscopy;  Laterality: N/A;  . ESOPHAGOGASTRODUODENOSCOPY (EGD) WITH PROPOFOL N/A 08/13/2015   Procedure: ESOPHAGOGASTRODUODENOSCOPY (EGD) WITH PROPOFOL;  Surgeon: Rachael Fee, MD;  Location: [facility] ENDOSCOPY;  Service: Endoscopy;  Laterality: N/A;  . ESOPHAGOGASTRODUODENOSCOPY ENDOSCOPY    . gall stone removed      FAMILY HISTORY: Family History  Problem Relation Age of Onset  . Stroke Mother   . Cancer Sister        type unknown  . Diabetes Father     SOCIAL HISTORY: Social History   Social History  . Marital status: Widowed  Spouse name: N/A  . Number of children: 0  . Years of education: N/A   Occupational History  . Not on file.   Social History Main Topics  . Smoking status: Current Every Day Smoker    Packs/day: 0.25    Types: Cigarettes    Last attempt to quit: 09/26/2015  . Smokeless tobacco: Former Neurosurgeon  . Alcohol use No  . Drug use: No  . Sexual activity: Not on file   Other Topics Concern  . Not on file   Social History Narrative   Lives home with family.  Using walker.  Caffeine cup in AM.  Niece, Gardiner Coins.       PHYSICAL EXAM  Vitals:   11/03/16 0929  BP: (!) 144/80  Pulse: 62  Weight: 137 lb (62.1 kg)  Height: 5\' 4"  (1.626 m)   Body mass index is 23.52 kg/m.  Generalized: Well developed, in no acute distress  Head: normocephalic and atraumatic,. Oropharynx benign  Neck: Supple, no carotid bruits  Cardiac: Regular rate rhythm, no murmur  Musculoskeletal: No deformity   Neurological examination   Mentation: Alert oriented to time, place,  history taking. Attention span and concentration appropriate. Recent and remote memory intact.  Follows all commands speech with mild dysarthia   Cranial nerve II-XII: Pupils were equal round reactive to light extraocular movements were full, visual field were full on confrontational test.  mild left facial droop . Hearing was intact to finger rubbing bilaterally. Uvula tongue midline. head turning and shoulder shrug were normal and symmetric.Tongue protrusion into cheek strength was normal. Motor: normal bulk and tone, full strength in the BUE, BLE,  on the right, left upper extremity 4 out of 5 and left lower extremity 4 out of 5 with leg brace. Sensory: normal and symmetric to light touch, pinprick, and  Vibrationin the upper and lower extremities Coordination : finger-nose-finger, heel-to-shin bilaterally, no dysmetria Reflexes1+ upper lower and symmetric plantar responses were flexor bilaterally. Gait and Station: Rising up from seated position without assistance, left hemiparetic gait with AFO, ambulates with walker, no difficulty with turns.  DIAGNOSTIC DATA (LABS, IMAGING, TESTING) - I reviewed patient records, labs, notes, testing and imaging myself where available.  Lab Results  Component Value Date   WBC 7.2 12/12/2015   HGB 13.0 12/12/2015   HCT 39.9 12/12/2015   MCV 91.9 12/12/2015   PLT 169 12/12/2015      Component Value Date/Time   NA 138 12/12/2015 0750   K 3.5 12/12/2015 0750   CL 112 (H) 12/12/2015 0750   CO2 18 (L) 12/12/2015 0750   GLUCOSE 87 12/12/2015 0750   BUN 13 12/12/2015 0750   CREATININE 1.04 (H) 12/12/2015 0750   CALCIUM 8.4 (L) 12/12/2015 0750   PROT 6.5 12/12/2015 0750   ALBUMIN 3.3 (L) 12/12/2015 0750   AST 28 12/12/2015 0750   ALT 23 12/12/2015 0750   ALKPHOS 74 12/12/2015 0750   BILITOT 0.5 12/12/2015 0750   GFRNONAA 56 (L) 12/12/2015 0750   GFRAA >60 12/12/2015 0750   Lab Results  Component Value Date   CHOL 284 (H) 09/27/2015   HDL 40 (L)  09/27/2015   LDLCALC 215 (H) 09/27/2015   TRIG 144 09/27/2015   CHOLHDL 7.1 09/27/2015   Lab Results  Component Value Date   HGBA1C 5.3 09/27/2015    ASSESSMENT AND PLAN 60 y.o. African American female with PMH of depression, PTSD, alcohol abuse, smoker, previous stroke and GI hemorrhage admitted on 09/26/15 for  right CR infarct but also old BG, thalamus and pontine infarcts on MRI, all consistent with small vessel disease. MRA, CUS, TTE, and A1C unremarkable. LDL 215. She was on ASA in the past, but took off due to GIB caused by gastric ulcer. She was put on plavix and lipitor 80 for stroke prevention and discharged to CIR. During the interval time, left sided weakness much improved and able to walk with walker . No longer seen by Dr. Wynn Banker for botox. He felt her left spastic hemiparesis is  a permanent deficit and no real benefit from Botox. The patient is a current patient of Dr. Roda Shutters  who is out of the office today . This note is sent to the work in doctor.      Plan:  Restart  plavix and continue lipitor for stroke prevention Stop smoking Follow up with your primary care physician for stroke risk factor modification. Recommend maintain blood pressure goal <130/80, diabetes with hemoglobin A1c goal below 7.0% and lipids with LDL cholesterol goal below 70 mg/dL.  B/P in the office 144/80 check BP at home and record Continue home exercise program and walking with walker for safe ambulation  Continue healthy diet Follow up in 6 months I spent 25 minutes in total face to face time with the patient more than 50% of which was spent counseling and coordination of care, reviewing test results reviewing medications and discussing and reviewing the diagnosis of stroke and management of risk factors. Discussed the importance of staying on Plavix for secondary stroke prevention Nilda Riggs, [facility], [facility], APRN  [facility] [address] [place], [place]  [address] [phone]

## 2016-11-02 NOTE — Progress Notes (Signed)
Pt not seen.

## 2016-11-03 ENCOUNTER — Encounter: Payer: Self-pay | Admitting: Nurse Practitioner

## 2016-11-03 ENCOUNTER — Ambulatory Visit (INDEPENDENT_AMBULATORY_CARE_PROVIDER_SITE_OTHER): Payer: Medicaid Other | Admitting: Nurse Practitioner

## 2016-11-03 VITALS — BP 144/80 | HR 62 | Ht 64.0 in | Wt 137.0 lb

## 2016-11-03 DIAGNOSIS — G8112 Spastic hemiplegia affecting left dominant side: Secondary | ICD-10-CM | POA: Diagnosis not present

## 2016-11-03 DIAGNOSIS — E785 Hyperlipidemia, unspecified: Secondary | ICD-10-CM | POA: Diagnosis not present

## 2016-11-03 DIAGNOSIS — I639 Cerebral infarction, unspecified: Secondary | ICD-10-CM

## 2016-11-03 DIAGNOSIS — I10 Essential (primary) hypertension: Secondary | ICD-10-CM | POA: Diagnosis not present

## 2016-11-03 DIAGNOSIS — F17200 Nicotine dependence, unspecified, uncomplicated: Secondary | ICD-10-CM | POA: Diagnosis not present

## 2016-11-03 DIAGNOSIS — IMO0002 Reserved for concepts with insufficient information to code with codable children: Secondary | ICD-10-CM

## 2016-11-03 MED ORDER — CLOPIDOGREL BISULFATE 75 MG PO TABS
75.0000 mg | ORAL_TABLET | Freq: Every day | ORAL | 6 refills | Status: AC
Start: 2016-11-03 — End: ?

## 2016-11-03 NOTE — Patient Instructions (Addendum)
Restartplavix and lipitor for stroke prevention Stop smoking Follow up with your primary care physician for stroke risk factor modification. Recommend maintain blood pressure goal <130/80, diabetes with hemoglobin A1c goal below 7.0% and lipids with LDL cholesterol goal below 70 mg/dL.  B/P in the office 144/80 check BP at home and record Continue home exercise program and walking with walker for safe ambulation  Continue healthy diet Follow up in 6 months

## 2016-11-03 NOTE — Progress Notes (Signed)
Fax confirmation received for plavix [phone] [facility].

## 2016-11-03 NOTE — Progress Notes (Signed)
I have read the note, and I agree with the clinical assessment and plan.  [name],[name]   

## 2017-01-20 ENCOUNTER — Emergency Department (HOSPITAL_COMMUNITY)
Admission: EM | Admit: 2017-01-20 | Discharge: 2017-01-21 | Disposition: A | Discharge status: Home / Self Care | Payer: Medicaid Other | Attending: Emergency Medicine | Admitting: Emergency Medicine

## 2017-01-20 ENCOUNTER — Other Ambulatory Visit: Payer: Self-pay

## 2017-01-20 ENCOUNTER — Encounter (HOSPITAL_COMMUNITY): Payer: Self-pay | Admitting: *Deleted

## 2017-01-20 ENCOUNTER — Emergency Department (HOSPITAL_COMMUNITY): Payer: Medicaid Other

## 2017-01-20 DIAGNOSIS — Z8673 Personal history of transient ischemic attack (TIA), and cerebral infarction without residual deficits: Secondary | ICD-10-CM | POA: Diagnosis not present

## 2017-01-20 DIAGNOSIS — K5900 Constipation, unspecified: Secondary | ICD-10-CM | POA: Diagnosis not present

## 2017-01-20 DIAGNOSIS — Z7901 Long term (current) use of anticoagulants: Secondary | ICD-10-CM | POA: Diagnosis not present

## 2017-01-20 DIAGNOSIS — F17210 Nicotine dependence, cigarettes, uncomplicated: Secondary | ICD-10-CM | POA: Diagnosis not present

## 2017-01-20 DIAGNOSIS — J45909 Unspecified asthma, uncomplicated: Secondary | ICD-10-CM | POA: Diagnosis not present

## 2017-01-20 DIAGNOSIS — Z79899 Other long term (current) drug therapy: Secondary | ICD-10-CM | POA: Diagnosis not present

## 2017-01-20 DIAGNOSIS — R630 Anorexia: Secondary | ICD-10-CM | POA: Diagnosis not present

## 2017-01-20 DIAGNOSIS — R1084 Generalized abdominal pain: Secondary | ICD-10-CM | POA: Insufficient documentation

## 2017-01-20 LAB — URINALYSIS, ROUTINE W REFLEX MICROSCOPIC
BILIRUBIN URINE: NEGATIVE
Glucose, UA: NEGATIVE mg/dL
HGB URINE DIPSTICK: NEGATIVE
Ketones, ur: NEGATIVE mg/dL
Leukocytes, UA: NEGATIVE
Nitrite: NEGATIVE
Protein, ur: NEGATIVE mg/dL
SPECIFIC GRAVITY, URINE: 1.01 (ref 1.005–1.030)
pH: 6 (ref 5.0–8.0)

## 2017-01-20 LAB — CBC
HCT: 39.9 % (ref 36.0–46.0)
HEMOGLOBIN: 13.1 g/dL (ref 12.0–15.0)
MCH: 30.6 pg (ref 26.0–34.0)
MCHC: 32.8 g/dL (ref 30.0–36.0)
MCV: 93.2 fL (ref 78.0–100.0)
PLATELETS: 146 10*3/uL — AB (ref 150–400)
RBC: 4.28 MIL/uL (ref 3.87–5.11)
RDW: 13.4 % (ref 11.5–15.5)
WBC: 7.5 10*3/uL (ref 4.0–10.5)

## 2017-01-20 LAB — COMPREHENSIVE METABOLIC PANEL
ALBUMIN: 4.4 g/dL (ref 3.5–5.0)
ALK PHOS: 80 U/L (ref 38–126)
ALT: 18 U/L (ref 14–54)
AST: 25 U/L (ref 15–41)
Anion gap: 6 (ref 5–15)
BUN: 15 mg/dL (ref 6–20)
CALCIUM: 9.4 mg/dL (ref 8.9–10.3)
CO2: 24 mmol/L (ref 22–32)
CREATININE: 0.91 mg/dL (ref 0.44–1.00)
Chloride: 109 mmol/L (ref 101–111)
GFR calc Af Amer: >60 mL/min (ref >60)
GFR calc non Af Amer: >60 mL/min (ref >60)
GLUCOSE: 73 mg/dL (ref 65–99)
Potassium: 3.8 mmol/L (ref 3.5–5.1)
SODIUM: 139 mmol/L (ref 135–145)
Total Bilirubin: 0.9 mg/dL (ref 0.3–1.2)
Total Protein: 7.1 g/dL (ref 6.5–8.1)

## 2017-01-20 LAB — LIPASE, BLOOD: Lipase: 46 U/L (ref 11–51)

## 2017-01-20 MED ORDER — GI COCKTAIL ~~LOC~~
30.0000 mL | Freq: Once | ORAL | Status: AC
  Administered 2017-01-20: 30 mL via ORAL
  Filled 2017-01-20: qty 30

## 2017-01-20 MED ORDER — SODIUM CHLORIDE 0.9 % IV BOLUS (SEPSIS)
500.0000 mL | Freq: Once | INTRAVENOUS | Status: AC
  Administered 2017-01-20: 500 mL via INTRAVENOUS

## 2017-01-20 MED ORDER — IOPAMIDOL (ISOVUE-300) INJECTION 61%
INTRAVENOUS | Status: AC
  Administered 2017-01-20: 100 mL
  Filled 2017-01-20: qty 100

## 2017-01-20 NOTE — ED Triage Notes (Signed)
Abdomen pain for one month no appetite  No nv or diarrhea

## 2017-01-20 NOTE — ED Provider Notes (Signed)
[facility]ENC[facility] Provider Note   CSN: [id number] Arrival date & time: 01/20/17  1647     History   Chief Complaint Chief Complaint  Patient presents with  . Abdominal Pain    HPI [name] is a 61 y.o. female with a hx of alcohol abuse, chronic back pain, colitis, CVA (with left-sided deficits 9 in 2017 now on Plavix), peptic ulcer disease, pancreatitis, seizures presents to the Emergency Department complaining of gradual, persistent, progressively worsening generalized and epigastric abdominal pain onset 3 weeks ago. Associated symptoms include decrease in appetite and nausea without vomiting or diarrhea.  Patient denies melena or hematochezia.  She reports that eating makes her symptoms worse but refraining from food does not make them better.  Patient reports for the last several years she has been regularly taking ibuprofen 2 times per day for a painful shoulder.  She reports normally taking this with food.  She reports she stopped taking this approximately 1 week ago.  Patient also reports she underwent an ultrasound ordered by her primary care 1 week ago.  She does not remember what the results were.  She does report that she was referred to gastroenterology after this but no appointment has been made for her yet.    The history is provided by the patient, medical records and a relative. No language interpreter was used.    Past Medical History:  Diagnosis Date  . Alcohol abuse   . Chronic back pain   . Colitis   . CVA (cerebral infarction)   . Fatty liver   . Gastric ulcer   . GI bleed   . Hepatitis    Hepatitis C  . MDD (major depressive disorder)   . Pancreatitis   . PTSD (post-traumatic stress disorder)   . Seizures (HCC)   . 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 Stroke (HCC)   . UTI (lower urinary tract infection)   . Vision abnormalities     Patient Active Problem List   Diagnosis Date Noted    . Hyperlipidemia 11/03/2016  . Former smoker 01/11/2016  . 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 Left hip pain   . History of GI bleed   . Spastic bladder   . Chronic back pain   . ETOH abuse   . Hypokalemia   . PTSD (post-traumatic stress disorder)   . Gastroesophageal reflux disease without esophagitis   . Acute ischemic stroke (HCC) 09/27/2015  . Acute CVA (cerebrovascular accident) (HCC) 09/27/2015  . Smoker   . Left-sided weakness 09/26/2015  . Acute gastric ulcer   . GI bleed 05/22/2014  . Acute GI bleeding 05/22/2014  . Acute blood loss anemia 05/22/2014  . History of stroke 05/22/2014  . Mixed hyperlipidemia 05/22/2014  . Gastrointestinal hemorrhage with melena   . 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 Flank pain 10/09/2012  . Right pontine stroke (HCC) 10/09/2012  . ABDOMINAL PAIN OTHER SPECIFIED SITE 05/17/2010  . ALCOHOL ABUSE 06/16/2007  . COCAINE ABUSE 06/16/2007  . DEPRESSION 06/16/2007  . Essential hypertension 06/16/2007  . CEREBROVASCULAR ACCIDENT 06/16/2007  . ASTHMA 06/16/2007  . GERD 06/16/2007  . PANCREATITIS 11/09/2006  . ESOPHAGITIS, REFLUX 05/23/2006    Past Surgical History:  Procedure Laterality Date  . ABDOMINAL HYSTERECTOMY    . APPENDECTOMY    . ESOPHAGOGASTRODUODENOSCOPY ENDOSCOPY    . gall stone removed  OB History    No data available       Home Medications    Prior to Admission medications   Medication Sig Start Date End Date Taking? Authorizing Provider  albuterol (PROVENTIL HFA;VENTOLIN HFA) 108 (90 BASE) MCG/ACT inhaler Inhale 2 puffs into the lungs every 8 (eight) hours as needed for wheezing or shortness of breath.   Yes [provider]  atorvastatin (LIPITOR) 80 MG tablet Take 1 tablet (80 mg total) by mouth daily at 6 PM. Patient taking differently: Take 20 mg daily at 6 PM by mouth.  10/26/15  Yes  Angiulli, Mcarthur Rossettianiel J, PA-C  clindamycin (CLEOCIN) 300 MG capsule Take 300 capsules every 6 (six) hours as needed by mouth. 01/13/17  Yes [provider]  clopidogrel (PLAVIX) 75 MG tablet Take 1 tablet (75 mg total) by mouth daily. 11/03/16  Yes Nilda RiggsMartin, Nancy Carolyn, NP  esomeprazole (NEXIUM) 40 MG capsule Take 40 mg daily by mouth. 12/26/16  Yes [provider]  fluticasone (FLONASE) 50 MCG/ACT nasal spray Place 1 spray into the nose daily as needed for allergies.  06/04/15  Yes [provider]  gabapentin (NEURONTIN) 300 MG capsule Take 1 capsule (300 mg total) by mouth 3 (three) times daily. Patient taking differently: Take 300 mg 3 (three) times daily by mouth. Filled 01/18/2017 10/26/15  Yes Angiulli, Mcarthur Rossettianiel J, PA-C  ibuprofen (ADVIL,MOTRIN) 800 MG tablet Take 800 mg as needed by mouth. 12/21/16  Yes [provider]  lisinopril (PRINIVIL,ZESTRIL) 20 MG tablet Take 20 mg daily by mouth. 11/15/16  Yes [provider]  naproxen (NAPROSYN) 500 MG tablet Take 500 mg 2 (two) times daily as needed by mouth. 11/28/16  Yes [provider]  rOPINIRole (REQUIP) 0.25 MG tablet Take 1 tablet (0.25 mg total) by mouth 3 (three) times daily. 12/02/15  Yes Kirsteins, Victorino SparrowAndrew E, MD  sertraline (ZOLOFT) 50 MG tablet Take 1 tablet (50 mg total) by mouth daily. 10/26/15  Yes Angiulli, Mcarthur Rossettianiel J, PA-C  traMADol-acetaminophen (ULTRACET) 37.5-325 MG tablet Take 1 tablet by mouth every 4 (four) hours as needed for moderate pain. 02/02/16  Yes Kirsteins, Victorino SparrowAndrew E, MD  traZODone (DESYREL) 50 MG tablet Take 50 mg at bedtime by mouth. 01/18/17  Yes [provider]  cycloSPORINE (RESTASIS) 0.05 % ophthalmic emulsion Place 1 drop into both eyes 2 (two) times daily. Patient not taking: Reported on 01/20/2017 10/26/15   Angiulli, Mcarthur Rossettianiel J, PA-C  famotidine (PEPCID) 20 MG tablet Take 1 tablet (20 mg total) by mouth 2 (two) times daily. Patient not taking: Reported on 01/20/2017  10/26/15   Angiulli, Mcarthur Rossettianiel J, PA-C  oxybutynin (DITROPAN-XL) 5 MG 24 hr tablet TAKE 1 TABLET BY MOUTH AT BEDTIME. 11/06/15   Marcello FennelPatel, Ankit Anil, MD  pantoprazole (PROTONIX) 40 MG tablet Take 1 tablet (40 mg total) by mouth 2 (two) times daily before a meal. 10/26/15   Angiulli, Mcarthur Rossettianiel J, PA-C  polyethylene glycol powder (GLYCOLAX/MIRALAX) powder Take 17 g 2 (two) times daily by mouth. 01/21/17   Kyrie Fludd, Dahlia ClientHannah, PA-C  ranitidine (ZANTAC) 150 MG tablet Take 1 tablet (150 mg total) 2 (two) times daily by mouth. 01/21/17   Oria Klimas, Dahlia ClientHannah, PA-C  senna-docusate (SENOKOT-S) 8.6-50 MG tablet Take 1 tablet by mouth 2 (two) times daily. 11/05/15   Marcello FennelPatel, Ankit Anil, MD  temazepam (RESTORIL) 15 MG capsule Take 15 mg by mouth at bedtime.    [provider]  Travoprost, BAK Free, (TRAVATAN) 0.004 % SOLN ophthalmic solution Place 1 drop into both  eyes at bedtime.     [provider]    Family History Family History  Problem Relation Age of Onset  . Stroke Mother   . Cancer Sister        type unknown  . Diabetes Father     Social History Social History   Tobacco Use  . Smoking status: Current Every Day Smoker    Packs/day: 0.25    Types: Cigarettes    Last attempt to quit: 09/26/2015    Years since quitting: 1.3  . Smokeless tobacco: Former Engineer, water Use Topics  . Alcohol use: No    Alcohol/week: 0.0 oz  . Drug use: No     Allergies   Banana; Penicillins; and Simbrinza [brinzolamide-brimonidine]   Review of Systems Review of Systems  Constitutional: Positive for appetite change ( decrease). Negative for diaphoresis, fatigue, fever and unexpected weight change.  HENT: Negative for mouth sores.   Eyes: Negative for visual disturbance.  Respiratory: Negative for cough, chest tightness, shortness of breath and wheezing.   Cardiovascular: Negative for chest pain.  Gastrointestinal: Positive for abdominal distention, abdominal pain and nausea. Negative for  constipation, diarrhea and vomiting.  Endocrine: Negative for polydipsia, polyphagia and polyuria.  Genitourinary: Negative for dysuria, frequency, hematuria and urgency.  Musculoskeletal: Negative for back pain and neck stiffness.  Skin: Negative for rash.  Allergic/Immunologic: Negative for immunocompromised state.  Neurological: Negative for syncope, light-headedness and headaches.  Hematological: Does not bruise/bleed easily.  Psychiatric/Behavioral: Negative for sleep disturbance. The patient is not nervous/anxious.      Physical Exam Updated Vital Signs BP (!) 147/73 (BP Location: Right Arm)   Pulse 63   Temp 98.7 F (37.1 C)   Resp (!) 22   Ht 5\' 4"  (1.626 m)   Wt 60.8 kg (134 lb)   SpO2 99%   BMI 23.00 kg/m   Physical Exam  Constitutional: She appears well-developed and well-nourished. No distress.  Awake, alert, nontoxic appearance  HENT:  Head: Normocephalic and atraumatic.  Mouth/Throat: Oropharynx is clear and moist. No oropharyngeal exudate.  Eyes: Conjunctivae are normal. No scleral icterus.  Neck: Normal range of motion. Neck supple.  Cardiovascular: Normal rate, regular rhythm and intact distal pulses.  Pulmonary/Chest: Effort normal and breath sounds normal. No respiratory distress. She has no wheezes.  Equal chest expansion  Abdominal: Soft. Bowel sounds are normal. She exhibits distension. She exhibits no mass. There is generalized tenderness and tenderness in the epigastric area. There is no rigidity, no rebound, no guarding and no CVA tenderness. No hernia.  Musculoskeletal: Normal range of motion. She exhibits no edema.  Neurological: She is alert.  Speech with dysarthria at baseline and goal oriented Left-sided facial droop Moves extremities without ataxia Left upper and lower extremity weakness with strength at 4/5; right upper extremity and lower extremity strength 5/5  Skin: Skin is warm and dry. She is not diaphoretic.  Psychiatric: She has a  normal mood and affect.  Nursing note and vitals reviewed.    ED Treatments / Results  Labs (all labs ordered are listed, but only abnormal results are displayed) Labs Reviewed  CBC - Abnormal; Notable for the following components:      Result Value   Platelets 146 (*)    All other components within normal limits  LIPASE, BLOOD  COMPREHENSIVE METABOLIC PANEL  URINALYSIS, ROUTINE W REFLEX MICROSCOPIC    EKG  EKG Interpretation None       Radiology Ct Abdomen Pelvis W Contrast  Result Date: 01/20/2017 CLINICAL DATA:  Abdominal pain for 1 month.  Loss of appetite. EXAM: CT ABDOMEN AND PELVIS WITH CONTRAST TECHNIQUE: Multidetector CT imaging of the abdomen and pelvis was performed using the standard protocol following bolus administration of intravenous contrast. CONTRAST:  50 cc ISOVUE-300 IOPAMIDOL (ISOVUE-300) INJECTION 61% COMPARISON:  12/12/2015 FINDINGS: Lower chest: Minimal bibasilar atelectasis. Normal heart size. Small stable pulmonary cyst or bulla in the left lower lobe. Hepatobiliary: Cholecystectomy.  No biliary mass or dilatation. Pancreas: Normal Spleen: Normal Adrenals/Urinary Tract: Adrenal glands are unremarkable. Kidneys are normal, without renal calculi, focal lesion, or hydronephrosis. Bladder is unremarkable. Stomach/Bowel: Status post appendectomy. Increased fecal retention throughout the colon consistent with constipation. No bowel obstruction or inflammation. Nondistended stomach. Vascular/Lymphatic: Aortic atherosclerosis. No enlarged abdominal or pelvic lymph nodes. Reproductive: Status post hysterectomy. No adnexal masses. Other: No abdominal wall hernia or abnormality. No abdominopelvic ascites. Musculoskeletal: No acute or significant osseous findings. Mild to moderate disc space narrowing L2-3. IMPRESSION: 1. Increased fecal retention throughout the colon consistent constipation. No bowel obstruction or inflammation. 2. Aortic atherosclerosis. 3. L2-3  degenerative disc disease. Electronically Signed   By: Tollie Eth M.D.   On: 01/20/2017 23:59    Procedures Procedures (including critical care time)  Medications Ordered in ED Medications  gi cocktail (Maalox,Lidocaine,Donnatal) (30 mLs Oral Given 01/20/17 2312)  sodium chloride 0.9 % bolus 500 mL (0 mLs Intravenous Stopped 01/21/17 0009)  iopamidol (ISOVUE-300) 61 % injection (100 mLs  Contrast Given 01/20/17 2336)     Initial Impression / Assessment and Plan / ED Course  I have reviewed the triage vital signs and the nursing notes.  Pertinent labs & imaging results that were available during my care of the patient were reviewed by me and considered in my medical decision making (see chart for details).     Patient presents with 3 weeks of generalized and epigastric abdominal pain.  Labs are reassuring.  No lab or clinical evidence of cholecystitis, cholelithiasis, pancreatitis.  Suspect possible gastric ulcer versus gastritis with chronic NSAID usage.  Patient denies melena, hematochezia and is without anemia today.  Doubt upper GI bleed.  Patient's abdomen is slightly distended with minimal tenderness.  No rebound or guarding.  Discussed risk versus benefit of CT scan as most likely causes of gastritis and peptic ulcer disease will not be visible.  Patient wishes for CT scan at this time.  1:12 AM CT scan shows evidence of constipation.  No evidence of acute abdominal process.  This likely contributes to patient's abdominal distention and decreased appetite.  Suspicious of potential gastritis versus peptic ulcer disease as well.  We will begin Zantac.  Patient has already been referred to gastroenterology for possible endoscopy and colonoscopy.  I have encouraged her to continue with this referral.  She is to follow-up with her primary care doctor in 1 week for further evaluation.  She is to remain off her NSAIDs until after she sees gastroenterology.  Patient and family state  understanding and are in agreement with the plan.  Final Clinical Impressions(s) / ED Diagnoses   Final diagnoses:  Generalized abdominal pain  Decreased appetite  Constipation, unspecified constipation type    ED Discharge Orders        Ordered    polyethylene glycol powder (GLYCOLAX/MIRALAX) powder  2 times daily     01/21/17 0112    ranitidine (ZANTAC) 150 MG tablet  2 times daily     01/21/17 0112  Jamarien Rodkey, Boyd Kerbs 01/21/17 0114    Charlynne Pander, MD 01/22/17 [phone]

## 2017-01-20 NOTE — ED Notes (Signed)
itriaged this pt earlier today no change in her condition

## 2017-01-20 NOTE — ED Notes (Signed)
To ct

## 2017-01-21 MED ORDER — POLYETHYLENE GLYCOL 3350 17 GM/SCOOP PO POWD: 17.0000 g | Freq: Two times a day (BID) | ORAL | 0 refills | Status: DC

## 2017-01-21 MED ORDER — RANITIDINE HCL 150 MG PO TABS: 150.0000 mg | ORAL_TABLET | Freq: Two times a day (BID) | ORAL | 0 refills | Status: DC

## 2017-01-21 NOTE — Discharge Instructions (Signed)
1. Medications: Zantac, Miralax usual home medications 2. Treatment: rest, drink plenty of fluids, advance diet slowly 3. Follow Up: Please followup with your primary doctor in 2 days for discussion of your diagnoses and further evaluation after today's visit; if you do not have a primary care doctor use the resource guide provided to find one; Please return to the ER for persistent vomiting, high fevers or worsening symptoms

## 2017-01-21 NOTE — ED Notes (Signed)
The pt came from c-t with iv blown from the iv contrast.  Iv cath  Removed   The pa reports not to reinsert the iv

## 2017-01-21 NOTE — ED Notes (Signed)
pts pain has not been better since she arrived no relief from the gi cocktail

## 2017-01-21 NOTE — ED Notes (Signed)
Pt verbalized understanding of d/c instructions and has no further questions. Pt is stable, A&Ox4, VSS.  

## 2017-02-13 ENCOUNTER — Ambulatory Visit (INDEPENDENT_AMBULATORY_CARE_PROVIDER_SITE_OTHER): Payer: Medicaid Other | Admitting: Podiatry

## 2017-02-13 ENCOUNTER — Encounter: Payer: Self-pay | Admitting: Podiatry

## 2017-02-13 VITALS — BP 131/74 | HR 73

## 2017-02-13 DIAGNOSIS — M79676 Pain in unspecified toe(s): Secondary | ICD-10-CM

## 2017-02-13 DIAGNOSIS — B351 Tinea unguium: Secondary | ICD-10-CM

## 2017-02-13 DIAGNOSIS — D689 Coagulation defect, unspecified: Secondary | ICD-10-CM

## 2017-02-13 NOTE — Progress Notes (Signed)
   Subjective:    Patient ID: [name], female    DOB: March 16, 1955, 61 y.o.   MRN: [id number]  HPI This patient presents today requesting nail debridement. Patient states that she's had a stroke and July 2017 and systems has difficulty and unable to trim her toenails and request toenail debridement. The nails of become progressively thicker and longer more difficult to trim over the past year. Patient on Plavix therapy   Review of Systems  All other systems reviewed and are negative.      Objective:   Physical Exam  Orientated 3  Vascular: DP pulses 2/4 right and 1/4 left PT pulses 2/4 right and 0/4 left Reflex within normal limits bilaterally  Neurological: Sensation to 10 g monofilament wire intact 6/8 right and 7/a left Vibratory sensation reactive bilaterally Ankle reflexes reactive bilaterally  Dermatological: No open skin lesions bilaterally Right hair growth bilaterally The toenails elongated, brittle 6-10  Musculoskeletal: Patient has AFO left Walks slowly with a roller walker HAV bilaterally Dorsi flexion 5/5 right and 3/5 left Plantar flexion 5/5 bilaterally       Assessment & Plan:   Assessment: 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

## 2017-02-28 ENCOUNTER — Ambulatory Visit (HOSPITAL_COMMUNITY)
Admission: EM | Admit: 2017-02-28 | Discharge: 2017-02-28 | Disposition: A | Discharge status: Home / Self Care | Payer: Medicaid Other | Attending: Family Medicine | Admitting: Family Medicine

## 2017-02-28 ENCOUNTER — Encounter (HOSPITAL_COMMUNITY): Payer: Self-pay | Admitting: Family Medicine

## 2017-02-28 DIAGNOSIS — M545 Low back pain, unspecified: Secondary | ICD-10-CM

## 2017-02-28 MED ORDER — PREDNISONE 20 MG PO TABS
40.0000 mg | ORAL_TABLET | Freq: Every day | ORAL | 0 refills | Status: AC
Start: 2017-02-28 — End: 2017-03-04

## 2017-02-28 MED ORDER — LIDOCAINE 5 % EX PTCH: 1.0000 | MEDICATED_PATCH | CUTANEOUS | 0 refills | Status: DC

## 2017-02-28 NOTE — Discharge Instructions (Signed)
Start lidocaine patches and prednisone as directed. Contact your PCP for further tramadol refills. Heat compresses as needed. Follow up with PCP if symptoms worsen, changes for reevaluation. If experience numbness/tingling of the inner thighs, loss of bladder or bowel control, go to the emergency department for evaluation.

## 2017-02-28 NOTE — ED Provider Notes (Signed)
[facility]    CSN: [id number] Arrival date & time: 02/28/17  1724     History   Chief Complaint Chief Complaint  Patient presents with  . Back Pain    HPI [name] is a 61 y.o. female.   61 year old female with history of alcohol abuse, cocaine abuse, gastric ulcer, CVA with left hemiplegia, chronic back pain, comes in for 4 day history of low back pain. Denies fall/injury. States it started when she was doing house chores. States pain radiates into the right lowe leg. Denies numbness/tingling, loss of bladder or bowel control. Denies fever, chills, night sweats. Denies urinary symptoms. States that her last refill of tramadol had the wrong prescriptions and therefore ran out of medications early. She has been taking motrin for the pain with little relief.       Past Medical History:  Diagnosis Date  . Alcohol abuse   . Chronic back pain   . Colitis   . CVA (cerebral infarction)   . Fatty liver   . Gastric ulcer   . GI bleed   . Hepatitis    Hepatitis C  . MDD (major depressive disorder)   . Pancreatitis   . PTSD (post-traumatic stress disorder)   . Seizures (HCC)   . 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 Stroke (HCC)   . UTI (lower urinary tract infection)   . Vision abnormalities     Patient Active Problem List   Diagnosis Date Noted  . Hyperlipidemia 11/03/2016  . Former smoker 01/11/2016  . 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 Left hip pain   . History of GI bleed   . Spastic bladder   . Chronic back pain   . ETOH abuse   . Hypokalemia   . PTSD (post-traumatic stress disorder)   . Gastroesophageal reflux disease without esophagitis   . Acute ischemic stroke (HCC) 09/27/2015  . Acute CVA (cerebrovascular accident) (HCC) 09/27/2015  . Smoker   . Left-sided weakness 09/26/2015  . Acute gastric ulcer   . GI bleed 05/22/2014  . Acute GI bleeding 05/22/2014    . Acute blood loss anemia 05/22/2014  . History of stroke 05/22/2014  . Mixed hyperlipidemia 05/22/2014  . Gastrointestinal hemorrhage with melena   . 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 Flank pain 10/09/2012  . Right pontine stroke (HCC) 10/09/2012  . ABDOMINAL PAIN OTHER SPECIFIED SITE 05/17/2010  . ALCOHOL ABUSE 06/16/2007  . COCAINE ABUSE 06/16/2007  . DEPRESSION 06/16/2007  . Essential hypertension 06/16/2007  . CEREBROVASCULAR ACCIDENT 06/16/2007  . ASTHMA 06/16/2007  . GERD 06/16/2007  . PANCREATITIS 11/09/2006  . ESOPHAGITIS, REFLUX 05/23/2006    Past Surgical History:  Procedure Laterality Date  . ABDOMINAL HYSTERECTOMY    . APPENDECTOMY    . COLONOSCOPY WITH PROPOFOL N/A 08/13/2015   Procedure: COLONOSCOPY WITH PROPOFOL;  Surgeon: Rachael Fee, MD;  Location: [facility];  Service: Endoscopy;  Laterality: N/A;  . ESOPHAGOGASTRODUODENOSCOPY N/A 05/23/2014   Procedure: ESOPHAGOGASTRODUODENOSCOPY (EGD);  Surgeon: Beverley Fiedler, MD;  Location: [facility] ENDOSCOPY;  Service: Endoscopy;  Laterality: N/A;  . ESOPHAGOGASTRODUODENOSCOPY (EGD) WITH PROPOFOL N/A 08/13/2015   Procedure: ESOPHAGOGASTRODUODENOSCOPY (EGD) WITH PROPOFOL;  Surgeon: Rachael Fee, MD;  Location: [facility];  Service: Endoscopy;  Laterality: N/A;  . ESOPHAGOGASTRODUODENOSCOPY ENDOSCOPY    . gall stone removed      OB History  No data available       Home Medications    Prior to Admission medications   Medication Sig Start Date End Date Taking? Authorizing Provider  albuterol (PROVENTIL HFA;VENTOLIN HFA) 108 (90 BASE) MCG/ACT inhaler Inhale 2 puffs into the lungs every 8 (eight) hours as needed for wheezing or shortness of breath.    [provider]  atorvastatin (LIPITOR) 80 MG tablet Take 1 tablet (80 mg total) by mouth daily at 6 PM. Patient taking differently: Take 20 mg daily at 6 PM by mouth.  10/26/15   Angiulli, Mcarthur Rossettianiel J, PA-C   clindamycin (CLEOCIN) 300 MG capsule Take 300 capsules every 6 (six) hours as needed by mouth. 01/13/17   [provider]  clopidogrel (PLAVIX) 75 MG tablet Take 1 tablet (75 mg total) by mouth daily. 11/03/16   Nilda RiggsMartin, Nancy Carolyn, NP  cycloSPORINE (RESTASIS) 0.05 % ophthalmic emulsion Place 1 drop into both eyes 2 (two) times daily. Patient not taking: Reported on 01/20/2017 10/26/15   Angiulli, Mcarthur Rossettianiel J, PA-C  esomeprazole (NEXIUM) 40 MG capsule Take 40 mg daily by mouth. 12/26/16   [provider]  famotidine (PEPCID) 20 MG tablet Take 1 tablet (20 mg total) by mouth 2 (two) times daily. Patient not taking: Reported on 01/20/2017 10/26/15   Angiulli, Mcarthur Rossettianiel J, PA-C  fluticasone [facility](FLONASE) 50 MCG/ACT nasal spray Place 1 spray into the nose daily as needed for allergies.  06/04/15   [provider]  gabapentin (NEURONTIN) 300 MG capsule Take 1 capsule (300 mg total) by mouth 3 (three) times daily. Patient taking differently: Take 300 mg 3 (three) times daily by mouth. Filled 01/18/2017 10/26/15   Angiulli, Mcarthur Rossettianiel J, PA-C  ibuprofen (ADVIL,MOTRIN) 800 MG tablet Take 800 mg as needed by mouth. 12/21/16   [provider]  lidocaine (LIDODERM) 5 % Place 1 patch onto the skin daily. Remove & Discard patch within 12 hours or as directed by MD 02/28/17   Cathie HoopsYu, Wenonah Milo V, PA-C  lisinopril (PRINIVIL,ZESTRIL) 20 MG tablet Take 20 mg daily by mouth. 11/15/16   [provider]  naproxen (NAPROSYN) 500 MG tablet Take 500 mg 2 (two) times daily as needed by mouth. 11/28/16   [provider]  oxybutynin (DITROPAN-XL) 5 MG 24 hr tablet TAKE 1 TABLET BY MOUTH AT BEDTIME. Patient not taking: Reported on 02/13/2017 11/06/15   Marcello FennelPatel, Ankit Anil, MD  pantoprazole (PROTONIX) 40 MG tablet Take 1 tablet (40 mg total) by mouth 2 (two) times daily before a meal. 10/26/15   Angiulli, Mcarthur Rossettianiel J, PA-C  polyethylene glycol powder (GLYCOLAX/MIRALAX) powder Take 17 g 2 (two) times daily by  mouth. 01/21/17   Muthersbaugh, Dahlia ClientHannah, PA-C  predniSONE (DELTASONE) 20 MG tablet Take 2 tablets (40 mg total) by mouth daily for 4 days. 02/28/17 03/04/17  Belinda FisherYu, Dawit Tankard V, PA-C  ranitidine (ZANTAC) 150 MG tablet Take 1 tablet (150 mg total) 2 (two) times daily by mouth. 01/21/17   Muthersbaugh, Dahlia ClientHannah, PA-C  rOPINIRole (REQUIP) 0.25 MG tablet Take 1 tablet (0.25 mg total) by mouth 3 (three) times daily. 12/02/15   Kirsteins, Victorino SparrowAndrew E, MD  senna-docusate (SENOKOT-S) 8.6-50 MG tablet Take 1 tablet by mouth 2 (two) times daily. Patient not taking: Reported on 02/13/2017 11/05/15   Marcello FennelPatel, Ankit Anil, MD  sertraline (ZOLOFT) 50 MG tablet Take 1 tablet (50 mg total) by mouth daily. 10/26/15   Angiulli, Mcarthur Rossettianiel J, PA-C  temazepam (RESTORIL) 15 MG capsule Take 15 mg by mouth at bedtime.    [provider]  traMADol-acetaminophen (ULTRACET) 37.5-325 MG tablet Take 1 tablet by mouth every 4 (four) hours as needed for moderate pain. 02/02/16   Kirsteins, Victorino SparrowAndrew E, MD  Travoprost, BAK Free, (TRAVATAN) 0.004 % SOLN ophthalmic solution Place 1 drop into both eyes at bedtime.     [provider]  traZODone (DESYREL) 50 MG tablet Take 50 mg at bedtime by mouth. 01/18/17   [provider]    Family History Family History  Problem Relation Age of Onset  . Stroke Mother   . Cancer Sister        type unknown  . Diabetes Father     Social History Social History   Tobacco Use  . Smoking status: Current Every Day Smoker    Packs/day: 0.25    Types: Cigarettes    Last attempt to quit: 09/26/2015    Years since quitting: 1.4  . Smokeless tobacco: Former Engineer, water[employer]  Substance Use Topics  . Alcohol use: No    Alcohol/week: 0.0 oz  . Drug use: No     Allergies   Banana; Penicillins; and Simbrinza [brinzolamide-brimonidine]   Review of Systems Review of Systems  Reason unable to perform ROS: See HPI as above.     Physical Exam Triage Vital Signs ED Triage Vitals  Enc Vitals Group      BP 02/28/17 1757 (!) 164/89     Pulse Rate 02/28/17 1757 65     Resp 02/28/17 1757 18     Temp 02/28/17 1757 98.6 F (37 C)     Temp src --      SpO2 02/28/17 1757 100 %     Weight --      Height --      Head Circumference --      Peak Flow --      Pain Score 02/28/17 1854 7     Pain Loc --      Pain Edu? --      Excl. in GC? --    No data found.  Updated Vital Signs BP (!) 164/89   Pulse 65   Temp 98.6 F (37 C)   Resp 18   SpO2 100%   Physical Exam  Constitutional: She is oriented to person, place, and time. She appears well-developed and well-nourished. No distress.  HENT:  Head: Normocephalic and atraumatic.  Eyes: Conjunctivae are normal. Pupils are equal, round, and reactive to light.  Cardiovascular: Normal rate, regular rhythm and normal heart sounds. Exam reveals no gallop and no friction rub.  No murmur heard. Pulmonary/Chest: Effort normal and breath sounds normal. She has no wheezes. She has no rales.  Musculoskeletal:  No tenderness on palpation of the spinous processes. Tenderness to palpation of right lowe back. No tenderness of right hip. Full ROM of back. Full passive ROM of hips. Patient with history of CVA, unable to keep leg up. Sensation intact and equal bilaterally. Negative straight leg raise.   Neurological: She is alert and oriented to person, place, and time.  Skin: Skin is warm and dry.     UC Treatments / Results  Labs (all labs ordered are listed, but only abnormal results are displayed) Labs Reviewed - No data to display  EKG  EKG Interpretation None       Radiology No results found.  Procedures Procedures (including critical care time)  Medications Ordered in UC Medications - No data to display   Initial Impression / Assessment and Plan / UC Course  I have  reviewed the triage vital signs and the nursing notes.  Pertinent labs & imaging results that were available during my care of the patient were reviewed by me and  considered in my medical decision making (see chart for details).    No alarming signs on exam. Discussed possible muscle strain. Given history of GI bleed from aspirin and CVA, will avoid NSAIDs. Will start lidocaine patches and prednisone. Patient to contact PCP for refill of tramadol. Return precautions given.    Final Clinical Impressions(s) / UC Diagnoses   Final diagnoses:  Acute right-sided low back pain without sciatica    ED Discharge Orders        Ordered    lidocaine (LIDODERM) 5 %  Every 24 hours     02/28/17 1844    predniSONE (DELTASONE) 20 MG tablet  Daily     02/28/17 1844        Belinda Fisher, PA-C 02/28/17 1904

## 2017-02-28 NOTE — ED Triage Notes (Signed)
Pt here for back pain that started Saturday. Denies any falling or injury. Reports lower back with radiation into legs. Denies any numbness or tingling. No weakness or loss of bowel or bladder.

## 2017-04-20 ENCOUNTER — Encounter: Payer: Self-pay | Admitting: Nurse Practitioner

## 2017-05-08 ENCOUNTER — Ambulatory Visit: Payer: Medicaid Other | Admitting: Nurse Practitioner

## 2017-06-12 ENCOUNTER — Telehealth: Payer: Self-pay

## 2017-06-12 NOTE — Telephone Encounter (Signed)
[facility] [phone]. Form receive and confirmed.

## 2017-06-16 ENCOUNTER — Ambulatory Visit: Payer: Medicaid Other | Admitting: Nurse Practitioner

## 2017-07-12 NOTE — Progress Notes (Deleted)
[facility]  PATIENT: [name] DOB: [birth year]   REASON FOR VISIT:  Follow-up for stroke HISTORY FROM: Patient and niece [name]    HISTORY OF PRESENT ILLNESS:[name] a 62 y.o.femalewith history of depression, PTSD, alcohol abuse, smoker, previous stroke and GI hemorrhage admitted on 09/26/15 for newonset left-sided weakness, slurred speechandleft facial droop. MRI showed right CR infarct but also old BG, thalamus and pontine infarcts, all consistent with small vessel disease. MRA, CUS, TTE, and A1C unremarkable. LDL 215. She was on ASA in the past, but took off due to GIB caused by gastric ulcer. Therefore, she was put on plavix and lipitor 80 for stroke prevention. She was later discharged to CIR.  01/11/16 follow up Dr. Roda Shutters- the patient has been doing better. Left sided weakness much improved and able to walk with walker. Finished with home PT/OT and will start with outpt PT/OT soon. She has quit smoking since the stroke. Followed with PCP last month. On plavix and lipitor without side effect. BP today 104/67.     Interval History2/5/18 Dr Roda Shutters During the interval time, pt has been doing well. No recurrent stroke. Followed with Dr. Wynn Banker for left sided hemiparesis and received botox injection at left foot. Able to walk with cane at home although came in today with walker. However, she started to smoke again around last Christmas. BP 131/77 today, taking lisinopril at home.   UPDATE 11/03/2016 CM Ms [name], 62 year old female returns for follow-up with history of stroke 09/26/2015 the onset of left-sided weakness slurred speech and left facial droop. MRI consistent with right CR infarct but also old BG thalamus and pontine infarcts which were consistent with small vessel disease. She had GI bleed from aspirin. She was on Plavix when last seen by Dr. however she was told to get off of her Plavix for Botox injections by Dr. Leslie Andrea  she claims that  he did not resume the medication after the injection. Her rehabilitation therapies have concluded. She is doing her home exercise program, uses AFO to left lower leg and ambulates with a walker. She remains on Lipitor without myalgias. Blood pressure in the office today 144/80. She has  Returned to s smoking strongly encouraged to quit.   She returns for reevaluation REVIEW OF SYSTEMS: Full 14 system review of systems performed and notable only for those listed, all others are neg:  Constitutional: neg  Cardiovascular: neg Ear/Nose/Throat: neg  Skin: neg Eyes: neg Respiratory: neg Gastroitestinal: neg  Hematology/Lymphatic: neg  Endocrine: neg Musculoskeleta Walking difficulty, left shoulder pain Allergy Immunology: neg Neurological: neg PsychiatricDepression Sleep  : neg   ALLERGIES: Allergies  Allergen Reactions  . Banana Other (See Comments)    Welts on skin and sores in mouth  . Penicillins Other (See Comments)    Blister Has patient had a PCN reaction causing immediate rash, facial/tongue/throat swelling, SOB or lightheadedness with hypotension: no Has patient had a PCN reaction causing severe rash involving mucus membranes or skin necrosis: yes - blisters Has patient had a PCN reaction that required hospitalization no Has patient had a PCN reaction occurring within the last 10 years: no If all of the above answers are "NO", then may proceed with Cephalosporin use.   Gwynneth Albright [Brinzolamide-Brimonidine] Swelling    Eyes swell shut    HOME MEDICATIONS: Outpatient Medications Prior to Visit  Medication Sig Dispense Refill  . albuterol (PROVENTIL HFA;VENTOLIN HFA) 108 (90 BASE) MCG/ACT inhaler Inhale 2 puffs into the lungs every 8 (eight)  hours as needed for wheezing or shortness of breath.    Marland Kitchen atorvastatin (LIPITOR) 80 MG tablet Take 1 tablet (80 mg total) by mouth daily at 6 PM. (Patient taking differently: Take 20 mg daily at 6 PM by mouth. ) 30 tablet 0  . clindamycin  (CLEOCIN) 300 MG capsule Take 300 capsules every 6 (six) hours as needed by mouth.  0  . clopidogrel (PLAVIX) 75 MG tablet Take 1 tablet (75 mg total) by mouth daily. 30 tablet 6  . cycloSPORINE (RESTASIS) 0.05 % ophthalmic emulsion Place 1 drop into both eyes 2 (two) times daily. (Patient not taking: Reported on 01/20/2017) 0.4 mL 0  . esomeprazole (NEXIUM) 40 MG capsule Take 40 mg daily by mouth.  3  . famotidine (PEPCID) 20 MG tablet Take 1 tablet (20 mg total) by mouth 2 (two) times daily. (Patient not taking: Reported on 01/20/2017) 60 tablet 0  . fluticasone (FLONASE) 50 MCG/ACT nasal spray Place 1 spray into the nose daily as needed for allergies.   2  . gabapentin (NEURONTIN) 300 MG capsule Take 1 capsule (300 mg total) by mouth 3 (three) times daily. (Patient taking differently: Take 300 mg 3 (three) times daily by mouth. Filled 01/18/2017) 90 capsule 1  . ibuprofen (ADVIL,MOTRIN) 800 MG tablet Take 800 mg as needed by mouth.  2  . lidocaine (LIDODERM) 5 % Place 1 patch onto the skin daily. Remove & Discard patch within 12 hours or as directed by MD 15 patch 0  . lisinopril (PRINIVIL,ZESTRIL) 20 MG tablet Take 20 mg daily by mouth.  3  . naproxen (NAPROSYN) 500 MG tablet Take 500 mg 2 (two) times daily as needed by mouth.  2  . oxybutynin (DITROPAN-XL) 5 MG 24 hr tablet TAKE 1 TABLET BY MOUTH AT BEDTIME. (Patient not taking: Reported on 02/13/2017) 90 tablet 0  . pantoprazole (PROTONIX) 40 MG tablet Take 1 tablet (40 mg total) by mouth 2 (two) times daily before a meal. 60 tablet 0  . polyethylene glycol powder (GLYCOLAX/MIRALAX) powder Take 17 g 2 (two) times daily by mouth. 255 g 0  . ranitidine (ZANTAC) 150 MG tablet Take 1 tablet (150 mg total) 2 (two) times daily by mouth. 60 tablet 0  . rOPINIRole (REQUIP) 0.25 MG tablet Take 1 tablet (0.25 mg total) by mouth 3 (three) times daily. 90 tablet 0  . senna-docusate (SENOKOT-S) 8.6-50 MG tablet Take 1 tablet by mouth 2 (two) times daily.  (Patient not taking: Reported on 02/13/2017) 60 tablet 1  . sertraline (ZOLOFT) 50 MG tablet Take 1 tablet (50 mg total) by mouth daily. 30 tablet 0  . temazepam (RESTORIL) 15 MG capsule Take 15 mg by mouth at bedtime.    . traMADol-acetaminophen (ULTRACET) 37.5-325 MG tablet Take 1 tablet by mouth every 4 (four) hours as needed for moderate pain. 30 tablet 0  . Travoprost, BAK Free, (TRAVATAN) 0.004 % SOLN ophthalmic solution Place 1 drop into both eyes at bedtime.     . traZODone (DESYREL) 50 MG tablet Take 50 mg at bedtime by mouth.  1   No facility-administered medications prior to visit.     PAST MEDICAL HISTORY: Past Medical History:  Diagnosis Date  . Alcohol abuse   . Chronic back pain   . Colitis   . CVA (cerebral infarction)   . Fatty liver   . Gastric ulcer   . GI bleed   . Hepatitis    Hepatitis C  . MDD (major depressive disorder)   .  Pancreatitis   . PTSD (post-traumatic stress disorder)   . Seizures (HCC)   . 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 Stroke (HCC)   . UTI (lower urinary tract infection)   . Vision abnormalities     PAST SURGICAL HISTORY: Past Surgical History:  Procedure Laterality Date  . ABDOMINAL HYSTERECTOMY    . APPENDECTOMY    . COLONOSCOPY WITH PROPOFOL N/A 08/13/2015   Procedure: COLONOSCOPY WITH PROPOFOL;  Surgeon: Rachael Fee, MD;  Location: [facility] ENDOSCOPY;  Service: Endoscopy;  Laterality: N/A;  . ESOPHAGOGASTRODUODENOSCOPY N/A 05/23/2014   Procedure: ESOPHAGOGASTRODUODENOSCOPY (EGD);  Surgeon: Beverley Fiedler, MD;  [facility] ENDOSCOPY;  Service: Endoscopy;  Laterality: N/A;  . ESOPHAGOGASTRODUODENOSCOPY (EGD) WITH PROPOFOL N/A 08/13/2015   Procedure: ESOPHAGOGASTRODUODENOSCOPY (EGD) WITH PROPOFOL;  Surgeon: Rachael Fee, MD;  Location: [facility] ENDOSCOPY;  Service: Endoscopy;  Laterality: N/A;  . ESOPHAGOGASTRODUODENOSCOPY ENDOSCOPY    . gall stone removed      FAMILY HISTORY: Family History  Problem  Relation Age of Onset  . Stroke Mother   . Cancer Sister        type unknown  . Diabetes Father     SOCIAL HISTORY: Social History   Socioeconomic History  . Marital status: Widowed    Spouse name: Not on file  . Number of children: 0  . Years of education: Not on file  . Highest education level: Not on file  Occupational History  . Not on file  Social Needs  . Financial resource strain: Not on file  . Food insecurity:    Worry: Not on file    Inability: Not on file  . Transportation needs:    Medical: Not on file    Non-medical: Not on file  Tobacco Use  . Smoking status: Current Every Day Smoker    Packs/day: 0.25    Types: Cigarettes    Last attempt to quit: 09/26/2015    Years since quitting: 1.7  . Smokeless tobacco: Former Engineer, water and Sexual Activity  . Alcohol use: No    Alcohol/week: 0.0 oz  . Drug use: No  . Sexual activity: Not on file  Lifestyle  . Physical activity:    Days per week: Not on file    Minutes per session: Not on file  . Stress: Not on file  Relationships  . Social connections:    Talks on phone: Not on file    Gets together: Not on file    Attends religious service: Not on file    Active member of club or organization: Not on file    Attends meetings of clubs or organizations: Not on file    Relationship status: Not on file  . Intimate partner violence:    Fear of current or ex partner: Not on file    Emotionally abused: Not on file    Physically abused: Not on file    Forced sexual activity: Not on file  Other Topics Concern  . Not on file  Social History Narrative   Lives home with family.  Using walker.  Caffeine cup in AM.  Niece, Gardiner Coins.       PHYSICAL EXAM  There were no vitals filed for this visit. There is no height or weight on file to calculate BMI.  Generalized: Well developed, in no acute distress  Head: normocephalic and atraumatic,. Oropharynx benign  Neck: Supple, no carotid bruits  Cardiac:  Regular rate rhythm, no murmur  Musculoskeletal:  No deformity   Neurological examination   Mentation: Alert oriented to time, place, history taking. Attention span and concentration appropriate. Recent and remote memory intact.  Follows all commands speech with mild dysarthia   Cranial nerve II-XII: Pupils were equal round reactive to light extraocular movements were full, visual field were full on confrontational test.  mild left facial droop . Hearing was intact to finger rubbing bilaterally. Uvula tongue midline. head turning and shoulder shrug were normal and symmetric.Tongue protrusion into cheek strength was normal. Motor: normal bulk and tone, full strength in the BUE, BLE,  on the right, left upper extremity 4 out of 5 and left lower extremity 4 out of 5 with leg brace. Sensory: normal and symmetric to light touch, pinprick, and  Vibrationin the upper and lower extremities Coordination : finger-nose-finger, heel-to-shin bilaterally, no dysmetria Reflexes1+ upper lower and symmetric plantar responses were flexor bilaterally. Gait and Station: Rising up from seated position without assistance, left hemiparetic gait with AFO, ambulates with walker, no difficulty with turns.  DIAGNOSTIC DATA (LABS, IMAGING, TESTING) - I reviewed patient records, labs, notes, testing and imaging myself where available.  Lab Results  Component Value Date   WBC 7.5 01/20/2017   HGB 13.1 01/20/2017   HCT 39.9 01/20/2017   MCV 93.2 01/20/2017   PLT 146 (L) 01/20/2017      Component Value Date/Time   NA 139 01/20/2017 1702   K 3.8 01/20/2017 1702   CL 109 01/20/2017 1702   CO2 24 01/20/2017 1702   GLUCOSE 73 01/20/2017 1702   BUN 15 01/20/2017 1702   CREATININE 0.91 01/20/2017 1702   CALCIUM 9.4 01/20/2017 1702   PROT 7.1 01/20/2017 1702   ALBUMIN 4.4 01/20/2017 1702   AST 25 01/20/2017 1702   ALT 18 01/20/2017 1702   ALKPHOS 80 01/20/2017 1702   BILITOT 0.9 01/20/2017 1702   GFRNONAA >60  01/20/2017 1702   GFRAA >60 01/20/2017 1702   Lab Results  Component Value Date   CHOL 284 (H) 09/27/2015   HDL 40 (L) 09/27/2015   LDLCALC 215 (H) 09/27/2015   TRIG 144 09/27/2015   CHOLHDL 7.1 09/27/2015   Lab Results  Component Value Date   HGBA1C 5.3 09/27/2015    ASSESSMENT AND PLAN 62 y.o. African American female with PMH of depression, PTSD, alcohol abuse, smoker, previous stroke and GI hemorrhage admitted on 09/26/15 for right CR infarct but also old BG, thalamus and pontine infarcts on MRI, all consistent with small vessel disease. MRA, CUS, TTE, and A1C unremarkable. LDL 215. She was on ASA in the past, but took off due to GIB caused by gastric ulcer. She was put on plavix and lipitor 80 for stroke prevention and discharged to CIR. During the interval time, left sided weakness much improved and able to walk with walker . No longer seen by Dr. Wynn Banker for botox. He felt her left spastic hemiparesis is  a permanent deficit and no real benefit from Botox. The patient is a current patient of Dr. Roda Shutters  who is out of the office today . This note is sent to the work in doctor.      Plan:  Restart  plavix and continue lipitor for stroke prevention Stop smoking Follow up with your primary care physician for stroke risk factor modification. Recommend maintain blood pressure goal <130/80, diabetes with hemoglobin A1c goal below 7.0% and lipids with LDL cholesterol goal below 70 mg/dL.  B/P in the office 144/80 check BP at home and  record Continue home exercise program and walking with walker for safe ambulation  Continue healthy diet Follow up in 6 months I spent 25 minutes in total face to face time with the patient more than 50% of which was spent counseling and coordination of care, reviewing test results reviewing medications and discussing and reviewing the diagnosis of stroke and management of risk factors. Discussed the importance of staying on Plavix for secondary stroke  prevention Nilda Riggs, [facility] APRN  [facility] [address], [address] [place], [place] [address] [phone]

## 2017-07-13 ENCOUNTER — Ambulatory Visit: Payer: Medicaid Other | Admitting: Nurse Practitioner

## 2017-07-17 ENCOUNTER — Encounter: Payer: Self-pay | Admitting: Nurse Practitioner

## 2017-09-26 ENCOUNTER — Encounter: Payer: Self-pay | Admitting: Podiatry

## 2017-09-26 ENCOUNTER — Ambulatory Visit: Payer: Medicaid Other | Admitting: Podiatry

## 2017-09-26 DIAGNOSIS — B351 Tinea unguium: Secondary | ICD-10-CM

## 2017-09-26 DIAGNOSIS — D689 Coagulation defect, unspecified: Secondary | ICD-10-CM

## 2017-09-26 DIAGNOSIS — M79676 Pain in unspecified toe(s): Secondary | ICD-10-CM

## 2017-09-26 NOTE — Progress Notes (Signed)
Complaint:  Visit Type: Patient returns to my office for continued preventative foot care services. Complaint: Patient states" my nails have grown long and thick and become painful to walk and wear shoes" Patient has been history of stroke leading to left side weakness.. The patient presents for preventative foot care services. No changes to ROS.  Patient is taking plavix.  Podiatric Exam: Vascular: dorsalis pedis and posterior tibial pulses are palpable bilateral. Capillary return is immediate. Temperature gradient is WNL. Skin turgor WNL  Sensorium:LOPS diminished left foot.  LOPS  WNL right foot. Left foot weakness. Nail Exam: Pt has thick disfigured discolored nails with subungual debris noted bilateral entire nail hallux through fifth toenails.  Pincer nail right hallux. Ulcer Exam: There is no evidence of ulcer or pre-ulcerative changes or infection. Orthopedic Exam: Muscle tone and strength are WNL. No limitations in general ROM. No crepitus or effusions noted. Foot type and digits show no abnormalities. HAV  B/L Skin: No Porokeratosis. No infection or ulcers  Diagnosis:  Onychomycosis, , Pain in right toe, pain in left toes  Treatment & Plan Procedures and Treatment: Consent by patient was obtained for treatment procedures.   Debridement of mycotic and hypertrophic toenails, 1 through 5 bilateral and clearing of subungual debris. No ulceration, no infection noted. ABN signed for 2019. Return Visit-Office Procedure: Patient instructed to return to the office for a follow up visit 3 months for continued evaluation and treatment.    Helane GuntherGregory Dimetri Armitage DPM

## 2017-10-24 NOTE — Progress Notes (Addendum)
[facility]  PATIENT: [name] DOB: 08-10-1955   REASON FOR VISIT:  Follow-up for stroke HISTORY FROM: Patient and niece Netty StarringBeth Ann    HISTORY OF PRESENT ILLNESS:Ms.Alexa Molelice McAdoois a 62 y.o.femalewith history of depression, PTSD, alcohol abuse, smoker, previous stroke and GI hemorrhage admitted on 09/26/15 for newonset left-sided weakness, slurred speechandleft facial droop. MRI showed right CR infarct but also old BG, thalamus and pontine infarcts, all consistent with small vessel disease. MRA, CUS, TTE, and A1C unremarkable. LDL 215. She was on ASA in the past, but took off due to GIB caused by gastric ulcer. Therefore, she was put on plavix and lipitor 80 for stroke prevention. She was later discharged to CIR.  01/11/16 follow up Dr. Roda ShuttersXu- the patient has been doing better. Left sided weakness much improved and able to walk with walker. Finished with home PT/OT and will start with outpt PT/OT soon. She has quit smoking since the stroke. Followed with PCP last month. On plavix and lipitor without side effect. BP today 104/67.     Interval History2/5/18 Dr Roda ShuttersXu During the interval time, pt has been doing well. No recurrent stroke. Followed with Dr. Wynn BankerKirsteins for left sided hemiparesis and received botox injection at left foot. Able to walk with cane at home although came in today with walker. However, she started to smoke again around last Christmas. BP 131/77 today, taking lisinopril at home.   UPDATE 11/03/2016 CM Ms [name], 62 year old female returns for follow-up with history of stroke 09/26/2015 the onset of left-sided weakness slurred speech and left facial droop. MRI consistent with right CR infarct but also old BG thalamus and pontine infarcts which were consistent with small vessel disease. She had GI bleed from aspirin. She was on Plavix when last seen by Dr. however she was told to get off of her Plavix for Botox injections by Dr. Leslie AndreaKirsreins  she claims that  he did not resume the medication after the injection. Her rehabilitation therapies have concluded. She is doing her home exercise program, uses AFO to left lower leg and ambulates with a walker. She remains on Lipitor without myalgias. Blood pressure in the office today 144/80. She has  Returned to s smoking strongly encouraged to quit.   She returns for reevaluation UPDATE 8/14/2019CM [name], 62 year old female returns for follow-up with history of stroke in July 2017 with onset of left-sided weakness slurred speech and left facial droop she is currently on Plavix for secondary stroke prevention without further stroke or TIA symptoms.  She has minimal bruising and no bleeding.  In addition she is on Lipitor without myalgias.  Blood pressure in the office today 139/85.  She continues to smoke 4 cigarettes a day.  She does not drink alcohol.  She continues to have AFO to left lower leg and ambulates with a walker.  No recent falls.  She no longer sees Dr. Wynn BankerKirsteins for Botox.  She returns for reevaluation REVIEW OF SYSTEMS: Full 14 system review of systems performed and notable only for those listed, all others are neg:  Constitutional: neg  Cardiovascular: neg Ear/Nose/Throat: neg  Skin: neg Eyes: neg Respiratory: neg Gastroitestinal: neg  Hematology/Lymphatic: neg  Endocrine: neg Musculoskeleta Walking difficulty,  Allergy Immunology: neg Neurological: neg Psychiatricneg Sleep  : neg   ALLERGIES: Allergies  Allergen Reactions  . Banana Other (See Comments)    Welts on skin and sores in mouth  . Penicillins Other (See Comments)    Blister Has patient had a PCN reaction  causing immediate rash, facial/tongue/throat swelling, SOB or lightheadedness with hypotension: no Has patient had a PCN reaction causing severe rash involving mucus membranes or skin necrosis: yes - blisters Has patient had a PCN reaction that required hospitalization no Has patient had a PCN reaction occurring within  the last 10 years: no If all of the above answers are "NO", then may proceed with Cephalosporin use.   Gwynneth Albright [Brinzolamide-Brimonidine] Swelling    Eyes swell shut    HOME MEDICATIONS: Outpatient Medications Prior to Visit  Medication Sig Dispense Refill  . albuterol (PROVENTIL HFA;VENTOLIN HFA) 108 (90 BASE) MCG/ACT inhaler Inhale 2 puffs into the lungs every 8 (eight) hours as needed for wheezing or shortness of breath.    Marland Kitchen atorvastatin (LIPITOR) 80 MG tablet Take 1 tablet (80 mg total) by mouth daily at 6 PM. (Patient taking differently: Take 20 mg daily at 6 PM by mouth. ) 30 tablet 0  . clindamycin (CLEOCIN) 300 MG capsule Take 300 capsules every 6 (six) hours as needed by mouth.  0  . clopidogrel (PLAVIX) 75 MG tablet Take 1 tablet (75 mg total) by mouth daily. 30 tablet 6  . cycloSPORINE (RESTASIS) 0.05 % ophthalmic emulsion Place 1 drop into both eyes 2 (two) times daily. 0.4 mL 0  . esomeprazole (NEXIUM) 40 MG capsule Take 40 mg daily by mouth.  3  . famotidine (PEPCID) 20 MG tablet Take 1 tablet (20 mg total) by mouth 2 (two) times daily. 60 tablet 0  . fluticasone (FLONASE) 50 MCG/ACT nasal spray Place 1 spray into the nose daily as needed for allergies.   2  . gabapentin (NEURONTIN) 300 MG capsule Take 1 capsule (300 mg total) by mouth 3 (three) times daily. (Patient taking differently: Take 300 mg 3 (three) times daily by mouth. Filled 01/18/2017) 90 capsule 1  . ibuprofen (ADVIL,MOTRIN) 800 MG tablet Take 800 mg as needed by mouth.  2  . lidocaine (LIDODERM) 5 % Place 1 patch onto the skin daily. Remove & Discard patch within 12 hours or as directed by MD 15 patch 0  . lisinopril (PRINIVIL,ZESTRIL) 20 MG tablet Take 20 mg daily by mouth.  3  . naproxen (NAPROSYN) 500 MG tablet Take 500 mg 2 (two) times daily as needed by mouth.  2  . oxybutynin (DITROPAN-XL) 5 MG 24 hr tablet TAKE 1 TABLET BY MOUTH AT BEDTIME. 90 tablet 0  . pantoprazole (PROTONIX) 40 MG tablet Take 1  tablet (40 mg total) by mouth 2 (two) times daily before a meal. 60 tablet 0  . polyethylene glycol powder (GLYCOLAX/MIRALAX) powder Take 17 g 2 (two) times daily by mouth. 255 g 0  . ranitidine (ZANTAC) 150 MG tablet Take 1 tablet (150 mg total) 2 (two) times daily by mouth. 60 tablet 0  . rOPINIRole (REQUIP) 0.25 MG tablet Take 1 tablet (0.25 mg total) by mouth 3 (three) times daily. 90 tablet 0  . [name]-docusate (SENOKOT-S) 8.6-50 MG tablet Take 1 tablet by mouth 2 (two) times daily. 60 tablet 1  . sertraline (ZOLOFT) 50 MG tablet Take 1 tablet (50 mg total) by mouth daily. 30 tablet 0  . temazepam (RESTORIL) 15 MG capsule Take 15 mg by mouth at bedtime.    . traMADol-acetaminophen (ULTRACET) 37.5-325 MG tablet Take 1 tablet by mouth every 4 (four) hours as needed for moderate pain. 30 tablet 0  . Travoprost, BAK Free, (TRAVATAN) 0.004 % SOLN ophthalmic solution Place 1 drop into both eyes at bedtime.     Marland Kitchen  traZODone (DESYREL) 50 MG tablet Take 50 mg at bedtime by mouth.  1   No facility-administered medications prior to visit.     PAST MEDICAL HISTORY: Past Medical History:  Diagnosis Date  . Alcohol abuse   . Chronic back pain   . Colitis   . CVA (cerebral infarction)   . Fatty liver   . Gastric ulcer   . GI bleed   . Hepatitis    Hepatitis C  . MDD (major depressive disorder)   . Pancreatitis   . PTSD (post-traumatic stress disorder)   . Seizures (HCC)   . 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 Stroke (HCC)   . UTI (lower urinary tract infection)   . Vision abnormalities     PAST SURGICAL HISTORY: Past Surgical History:  Procedure Laterality Date  . ABDOMINAL HYSTERECTOMY    . APPENDECTOMY    . COLONOSCOPY WITH PROPOFOL N/A 08/13/2015   Procedure: COLONOSCOPY WITH PROPOFOL;  Surgeon: Rachael Fee, MD;  Location: [facility] ENDOSCOPY;  Service: Endoscopy;  Laterality: N/A;  . ESOPHAGOGASTRODUODENOSCOPY N/A 05/23/2014   Procedure:  ESOPHAGOGASTRODUODENOSCOPY (EGD);  Surgeon: Beverley Fiedler, MD;  Location[facility] ENDOSCOPY;  Service: Endoscopy;  Laterality: N/A;  . ESOPHAGOGASTRODUODENOSCOPY (EGD) WITH PROPOFOL N/A 08/13/2015   Procedure: ESOPHAGOGASTRODUODENOSCOPY (EGD) WITH PROPOFOL;  Surgeon: Rachael Fee, MD;  Location: [facility] ENDOSCOPY;  Service: Endoscopy;  Laterality: N/A;  . ESOPHAGOGASTRODUODENOSCOPY ENDOSCOPY    . gall stone removed      FAMILY HISTORY: Family History  Problem Relation Age of Onset  . Stroke Mother   . Cancer Sister        type unknown  . Diabetes Father     SOCIAL HISTORY: Social History   Socioeconomic History  . Marital status: Widowed    Spouse name: Not on file  . Number of children: 0  . Years of education: Not on file  . Highest education level: Not on file  Occupational History  . Not on file  Social Needs  . Financial resource strain: Not on file  . Food insecurity:    Worry: Not on file    Inability: Not on file  . Transportation needs:    Medical: Not on file    Non-medical: Not on file  Tobacco Use  . Smoking status: Current Every Day Smoker    Packs/day: 0.12    Types: Cigarettes    Last attempt to quit: 09/26/2015    Years since quitting: 2.0  . Smokeless tobacco: Former Engineer, water and Sexual Activity  . Alcohol use: No    Alcohol/week: 0.0 standard drinks  . Drug use: No  . Sexual activity: Not on file  Lifestyle  . Physical activity:    Days per week: Not on file    Minutes per session: Not on file  . Stress: Not on file  Relationships  . Social connections:    Talks on phone: Not on file    Gets together: Not on file    Attends religious service: Not on file    Active member of club or organization: Not on file    Attends meetings of clubs or organizations: Not on file    Relationship status: Not on file  . Intimate partner violence:    Fear of current or ex partner: Not on file    Emotionally abused: Not on file    Physically abused: Not on  file    Forced sexual activity: Not on file  Other Topics Concern  . Not on file  Social History Narrative   Lives home with family.  Using walker.  Caffeine cup in AM.  Niece, Gardiner Coins.       PHYSICAL EXAM  Vitals:   10/25/17 1234  BP: 139/85  Pulse: 74  Weight: 146 lb 3.2 oz (66.3 kg)  Height: 5\' 4"  (1.626 m)   Body mass index is 25.1 kg/m.  Generalized: Well developed, in no acute distress  Head: normocephalic and atraumatic,. Oropharynx benign  Neck: Supple, no carotid bruits  Cardiac: Regular rate rhythm, no murmur  Musculoskeletal: No deformity   Neurological examination   Mentation: Alert oriented to time, place, history taking. Attention span and concentration appropriate. Recent and remote memory intact.  Follows all commands speech without dysarthria  Cranial nerve II-XII: Pupils were equal round reactive to light extraocular movements were full, visual field were full on confrontational test.  mild left facial droop . Hearing was intact to finger rubbing bilaterally. Uvula tongue midline. head turning and shoulder shrug were normal and symmetric.Tongue protrusion into cheek strength was normal. Motor: normal bulk and tone, full strength in the BUE, BLE,  on the right, left upper extremity 5 out of 5 and left lower extremity 4 out of 5 with leg brace. Sensory: normal and symmetric to light touch, pinprick, and  Vibrationin the upper and lower extremities Coordination : finger-nose-finger, heel-to-shin bilaterally, no dysmetria Reflexes1+ upper lower and symmetric plantar responses were flexor bilaterally. Gait and Station: Rising up from seated position without assistance, left hemiparetic gait with AFO, ambulates with walker, no difficulty with turns.  DIAGNOSTIC DATA (LABS, IMAGING, TESTING) - I reviewed patient records, labs, notes, testing and imaging myself where available.  Lab Results  Component Value Date   WBC 7.5 01/20/2017   HGB 13.1 01/20/2017    HCT 39.9 01/20/2017   MCV 93.2 01/20/2017   PLT 146 (L) 01/20/2017      Component Value Date/Time   NA 139 01/20/2017 1702   K 3.8 01/20/2017 1702   CL 109 01/20/2017 1702   CO2 24 01/20/2017 1702   GLUCOSE 73 01/20/2017 1702   BUN 15 01/20/2017 1702   CREATININE 0.91 01/20/2017 1702   CALCIUM 9.4 01/20/2017 1702   PROT 7.1 01/20/2017 1702   ALBUMIN 4.4 01/20/2017 1702   AST 25 01/20/2017 1702   ALT 18 01/20/2017 1702   ALKPHOS 80 01/20/2017 1702   BILITOT 0.9 01/20/2017 1702   GFRNONAA >60 01/20/2017 1702   GFRAA >60 01/20/2017 1702   Lab Results  Component Value Date   CHOL 284 (H) 09/27/2015   HDL 40 (L) 09/27/2015   LDLCALC 215 (H) 09/27/2015   TRIG 144 09/27/2015   CHOLHDL 7.1 09/27/2015   Lab Results  Component Value Date   HGBA1C 5.3 09/27/2015    ASSESSMENT AND PLAN 62 y.o. African American female with PMH of depression, PTSD, alcohol abuse, smoker, previous stroke and GI hemorrhage admitted on 09/26/15 for right CR infarct but also old BG, thalamus and pontine infarcts on MRI, all consistent with small vessel disease. MRA, CUS, TTE, and A1C unremarkable. LDL 215. She was on ASA in the past, but took off due to GIB caused by gastric ulcer. She was put on plavix and lipitor 80 for stroke prevention and discharged to CIR. During the interval time, left sided weakness much improved and able to walk with walker . No longer seen by Dr. Wynn Banker for botox. He felt her left spastic hemiparesis  is  a permanent deficit and no real benefit from Botox. The patient is a current patient of Dr. Roda ShuttersXu  who no longer works at this office.       Plan:  Continue  plavix and continue lipitor for stroke prevention Stop smoking completely down to 4 daily Follow up with your primary care physician for stroke risk factor modification. Recommend maintain blood pressure goal <130/80, diabetes with hemoglobin A1c goal below 7.0% and lipids with LDL cholesterol goal below 70 mg/dL.  B/P in the  office 139/85 Continue home exercise program and walking with walker for safe ambulation  Continue healthy diet No further stroke like symptoms since July 2017 Will discharge from stroke clinic I spent 25 minutes in total face to face time with the patient/niece more than 50% of which was spent counseling and coordination of care, reviewing test results reviewing medications and discussing and reviewing the diagnosis of stroke and management of risk factors. Discussed the importance of staying on Plavix for secondary stroke prevention.  Given written handout on stroke prevention and reviewed this with patient Nilda RiggsNancy Carolyn Martin, [facility] CenterGNP, [facility] PcBC, APRN  [facility] [address], [address] [place], [place] [address] [phone]  I reviewed the above note and documentation by the Nurse Practitioner and agree with the history, physical exam, assessment and plan as outlined above. I was immediately available for face-to-face consultation. Huston FoleySaima Athar, MD, PhD [facility])

## 2017-10-25 ENCOUNTER — Encounter: Payer: Self-pay | Admitting: Nurse Practitioner

## 2017-10-25 ENCOUNTER — Ambulatory Visit: Payer: Medicaid Other | Admitting: Nurse Practitioner

## 2017-10-25 VITALS — BP 139/85 | HR 74 | Ht 64.0 in | Wt 146.2 lb

## 2017-10-25 DIAGNOSIS — Z8673 Personal history of transient ischemic attack (TIA), and cerebral infarction without residual deficits: Secondary | ICD-10-CM

## 2017-10-25 DIAGNOSIS — E782 Mixed hyperlipidemia: Secondary | ICD-10-CM

## 2017-10-25 DIAGNOSIS — R531 Weakness: Secondary | ICD-10-CM | POA: Diagnosis not present

## 2017-10-25 DIAGNOSIS — I10 Essential (primary) hypertension: Secondary | ICD-10-CM | POA: Diagnosis not present

## 2017-10-25 NOTE — Patient Instructions (Addendum)
Continue  plavix and continue lipitor for stroke prevention Stop smoking completely down to 4 daily Follow up with your primary care physician for stroke risk factor modification. Recommend maintain blood pressure goal <130/80, diabetes with hemoglobin A1c goal below 7.0% and lipids with LDL cholesterol goal below 70 mg/dL.  B/P in the office 139/85 Continue home exercise program and walking with walker for safe ambulation  Continue healthy diet No further stroke like symptoms  Will dischargefrom stroke clinic  Stroke Prevention Some health problems and behaviors may make it more likely for you to have a stroke. Below are ways to lessen your risk of having a stroke.  Be active for at least 30 minutes on most or all days.  Do not smoke. Try not to be around others who smoke.  Do not drink too much alcohol. ? Do not have more than 2 drinks a day if you are a man. ? Do not have more than 1 drink a day if you are a woman and are not pregnant.  Eat healthy foods, such as fruits and vegetables. If you were put on a specific diet, follow the diet as told.  Keep your cholesterol levels under control through diet and medicines. Look for foods that are low in saturated fat, trans fat, cholesterol, and are high in fiber.  If you have diabetes, follow all diet plans and take your medicine as told.  Ask your doctor if you need treatment to lower your blood pressure. If you have high blood pressure (hypertension), follow all diet plans and take your medicine as told by your doctor.  If you are 1818-64 years old, have your blood pressure checked every 3-5 years. If you are age 64 or older, have your blood pressure checked every year.  Keep a healthy weight. Eat foods that are low in calories, salt, saturated fat, trans fat, and cholesterol.  Do not take drugs.  Avoid birth control pills, if this applies. Talk to your doctor about the risks of taking birth control pills.  Talk to your doctor if you  have sleep problems (sleep apnea).  Take all medicine as told by your doctor. ? You may be told to take aspirin or blood thinner medicine. Take this medicine as told by your doctor. ? Understand your medicine instructions.  Make sure any other conditions you have are being taken care of.  Get help right away if:  You suddenly lose feeling (you feel numb) or have weakness in your face, arm, or leg.  Your face or eyelid hangs down to one side.  You suddenly feel confused.  You have trouble talking (aphasia) or understanding what people are saying.  You suddenly have trouble seeing in one or both eyes.  You suddenly have trouble walking.  You are dizzy.  You lose your balance or your movements are clumsy (uncoordinated).  You suddenly have a very bad headache and you do not know the cause.  You have new chest pain.  Your heart feels like it is fluttering or skipping a beat (irregular heartbeat). Do not wait to see if the symptoms above go away. Get help right away. Call your local emergency services (911 in [place] Do not drive yourself to the hospital. This information is not intended to replace advice given to you by your health care provider. Make sure you discuss any questions you have with your health care provider. Document Released: 08/30/2011 Document Revised: 08/06/2015 Document Reviewed: 08/31/2012 Elsevier Interactive Patient Education  2018 [employer]  [employer]  

## 2017-12-26 ENCOUNTER — Ambulatory Visit: Payer: Medicaid Other | Admitting: Podiatry

## 2017-12-27 ENCOUNTER — Ambulatory Visit (INDEPENDENT_AMBULATORY_CARE_PROVIDER_SITE_OTHER): Payer: Medicaid Other | Admitting: Podiatry

## 2017-12-27 ENCOUNTER — Encounter: Payer: Self-pay | Admitting: Podiatry

## 2017-12-27 DIAGNOSIS — B351 Tinea unguium: Secondary | ICD-10-CM | POA: Diagnosis not present

## 2017-12-27 DIAGNOSIS — D689 Coagulation defect, unspecified: Secondary | ICD-10-CM

## 2017-12-27 DIAGNOSIS — M79676 Pain in unspecified toe(s): Secondary | ICD-10-CM | POA: Diagnosis not present

## 2018-01-11 NOTE — Progress Notes (Signed)
Subjective:  Patient ID: [name], female    DOB: [birth year],  MRN: [id number]  Chief Complaint  Patient presents with  . Nail Care    bilateral nail trim    61 y.o. female presents with the above complaint.  Reports painfully elongated nails to both feet.  Review of Systems: Negative except as noted in the HPI. Denies N/V/F/Ch.  Past Medical History:  Diagnosis Date  . Alcohol abuse   . Chronic back pain   . Colitis   . CVA (cerebral infarction)   . Fatty liver   . Gastric ulcer   . GI bleed   . Hepatitis    Hepatitis C  . MDD (major depressive disorder)   . Pancreatitis   . PTSD (post-traumatic stress disorder)   . Seizures (HCC)   . 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 Stroke (HCC)   . UTI (lower urinary tract infection)   . Vision abnormalities     Current Outpatient Medications:  .  albuterol (PROVENTIL HFA;VENTOLIN HFA) 108 (90 BASE) MCG/ACT inhaler, Inhale 2 puffs into the lungs every 8 (eight) hours as needed for wheezing or shortness of breath., Disp: , Rfl:  .  atorvastatin (LIPITOR) 80 MG tablet, Take 1 tablet (80 mg total) by mouth daily at 6 PM. (Patient taking differently: Take 20 mg daily at 6 PM by mouth. ), Disp: 30 tablet, Rfl: 0 .  clindamycin (CLEOCIN) 300 MG capsule, Take 300 capsules every 6 (six) hours as needed by mouth., Disp: , Rfl: 0 .  clopidogrel (PLAVIX) 75 MG tablet, Take 1 tablet (75 mg total) by mouth daily., Disp: 30 tablet, Rfl: 6 .  cycloSPORINE (RESTASIS) 0.05 % ophthalmic emulsion, Place 1 drop into both eyes 2 (two) times daily., Disp: 0.4 mL, Rfl: 0 .  esomeprazole (NEXIUM) 40 MG capsule, Take 40 mg daily by mouth., Disp: , Rfl: 3 .  famotidine (PEPCID) 20 MG tablet, Take 1 tablet (20 mg total) by mouth 2 (two) times daily., Disp: 60 tablet, Rfl: 0 .  fluticasone (FLONASE) 50 MCG/ACT nasal spray, Place 1 spray into the nose daily as needed for allergies. , Disp: , Rfl: 2 .  gabapentin (NEURONTIN) 300  MG capsule, Take 1 capsule (300 mg total) by mouth 3 (three) times daily. (Patient taking differently: Take 300 mg 3 (three) times daily by mouth. Filled 01/18/2017), Disp: 90 capsule, Rfl: 1 .  ibuprofen (ADVIL,MOTRIN) 800 MG tablet, Take 800 mg as needed by mouth., Disp: , Rfl: 2 .  lidocaine (LIDODERM) 5 %, Place 1 patch onto the skin daily. Remove & Discard patch within 12 hours or as directed by MD, Disp: 15 patch, Rfl: 0 .  lisinopril (PRINIVIL,ZESTRIL) 20 MG tablet, Take 20 mg daily by mouth., Disp: , Rfl: 3 .  naproxen (NAPROSYN) 500 MG tablet, Take 500 mg 2 (two) times daily as needed by mouth., Disp: , Rfl: 2 .  oxybutynin (DITROPAN-XL) 5 MG 24 hr tablet, TAKE 1 TABLET BY MOUTH AT BEDTIME., Disp: 90 tablet, Rfl: 0 .  pantoprazole (PROTONIX) 40 MG tablet, Take 1 tablet (40 mg total) by mouth 2 (two) times daily before a meal., Disp: 60 tablet, Rfl: 0 .  polyethylene glycol powder (GLYCOLAX/MIRALAX) powder, Take 17 g 2 (two) times daily by mouth., Disp: 255 g, Rfl: 0 .  ranitidine (ZANTAC) 150 MG tablet, Take 1 tablet (150 mg total) 2 (two) times daily by mouth., Disp: 60 tablet, Rfl: 0 .  rOPINIRole (REQUIP) 0.25 MG tablet,  Take 1 tablet (0.25 mg total) by mouth 3 (three) times daily., Disp: 90 tablet, Rfl: 0 .  senna-docusate (SENOKOT-S) 8.6-50 MG tablet, Take 1 tablet by mouth 2 (two) times daily., Disp: 60 tablet, Rfl: 1 .  sertraline (ZOLOFT) 50 MG tablet, Take 1 tablet (50 mg total) by mouth daily., Disp: 30 tablet, Rfl: 0 .  temazepam (RESTORIL) 15 MG capsule, Take 15 mg by mouth at bedtime., Disp: , Rfl:  .  traMADol-acetaminophen (ULTRACET) 37.5-325 MG tablet, Take 1 tablet by mouth every 4 (four) hours as needed for moderate pain., Disp: 30 tablet, Rfl: 0 .  Travoprost, BAK Free, (TRAVATAN) 0.004 % SOLN ophthalmic solution, Place 1 drop into both eyes at bedtime. , Disp: , Rfl:  .  traZODone (DESYREL) 50 MG tablet, Take 50 mg at bedtime by mouth., Disp: , Rfl: 1  Social History    Tobacco Use  Smoking Status Current Every Day Smoker  . Packs/day: 0.12  . Types: Cigarettes  . Last attempt to quit: 09/26/2015  . Years since quitting: 2.2  Smokeless Tobacco Former User    Allergies  Allergen Reactions  . Banana Other (See Comments)    Welts on skin and sores in mouth  . Penicillins Other (See Comments)    Blister Has patient had a PCN reaction causing immediate rash, facial/tongue/throat swelling, SOB or lightheadedness with hypotension: no Has patient had a PCN reaction causing severe rash involving mucus membranes or skin necrosis: yes - blisters Has patient had a PCN reaction that required hospitalization no Has patient had a PCN reaction occurring within the last 10 years: no If all of the above answers are "NO", then may proceed with Cephalosporin use.   Gwynneth Albright [Brinzolamide-Brimonidine] Swelling    Eyes swell shut   Objective:  There were no vitals filed for this visit. There is no height or weight on file to calculate BMI. Constitutional Well developed. Well nourished.  Vascular Dorsalis pedis pulses palpable bilaterally. Posterior tibial pulses palpable bilaterally. Capillary refill normal to all digits.  No cyanosis or clubbing noted. Pedal hair growth normal.  Neurologic Normal speech. Oriented to person, place, and time. Epicritic sensation to light touch grossly present bilaterally.  Dermatologic Nails elongated dystrophic pain to palpation No open wounds. No skin lesions.  Orthopedic: Normal joint ROM without pain or crepitus bilaterally. No visible deformities. No bony tenderness.   Radiographs: None Assessment:   1. Mycotic toenails   2. Blood clotting disorder (HCC)    Plan:  Patient was evaluated and treated and all questions answered.  Onychomycosis with coagulation defect -Nails palliatively debridement as below -Educated on self-care  Procedure: Nail Debridement Rationale: Pain Type of Debridement: manual,  sharp debridement. Instrumentation: Nail nipper, rotary burr. Number of Nails: 10   Return in about 3 months (around 03/29/2018).

## 2018-03-12 ENCOUNTER — Other Ambulatory Visit: Payer: Self-pay | Admitting: Optometry

## 2018-03-12 DIAGNOSIS — H5347 Heteronymous bilateral field defects: Secondary | ICD-10-CM

## 2018-03-23 ENCOUNTER — Other Ambulatory Visit: Payer: Medicaid Other

## 2018-03-26 ENCOUNTER — Ambulatory Visit
Admission: RE | Admit: 2018-03-26 | Discharge: 2018-03-26 | Disposition: A | Discharge status: Home / Self Care | Payer: Medicaid Other | Source: Ambulatory Visit | Attending: Optometry | Admitting: Optometry

## 2018-03-26 ENCOUNTER — Other Ambulatory Visit: Payer: Self-pay | Admitting: Optometry

## 2018-03-26 DIAGNOSIS — H5347 Heteronymous bilateral field defects: Secondary | ICD-10-CM

## 2018-03-26 MED ORDER — GADOBENATE DIMEGLUMINE 529 MG/ML IV SOLN
9.0000 mL | Freq: Once | INTRAVENOUS | Status: AC | PRN
  Administered 2018-03-26: 9 mL via INTRAVENOUS

## 2018-03-30 ENCOUNTER — Encounter: Payer: Self-pay | Admitting: Podiatry

## 2018-03-30 ENCOUNTER — Ambulatory Visit: Payer: Medicaid Other | Admitting: Podiatry

## 2018-03-30 DIAGNOSIS — B351 Tinea unguium: Secondary | ICD-10-CM | POA: Diagnosis not present

## 2018-03-30 DIAGNOSIS — M79674 Pain in right toe(s): Secondary | ICD-10-CM | POA: Diagnosis not present

## 2018-03-30 DIAGNOSIS — D689 Coagulation defect, unspecified: Secondary | ICD-10-CM

## 2018-03-30 DIAGNOSIS — M79675 Pain in left toe(s): Secondary | ICD-10-CM | POA: Diagnosis not present

## 2018-03-31 NOTE — Progress Notes (Addendum)
Complaint:  Visit Type: Patient returns to my office for continued preventative foot care services. 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   The patient presents for preventative foot care services. No changes to ROS.  Patient is taking plavix.  Podiatric Exam: Vascular: dorsalis pedis and posterior tibial pulses are palpable bilateral. Capillary return is immediate. Temperature gradient is WNL. Skin turgor WNL  Sensorium: Normal Semmes Weinstein monofilament test.right foot.  LOPS diminished left foot  Normal tactile sensation bilaterally. Nail Exam: Pt has thick disfigured discolored nails with subungual debris noted bilateral entire nail hallux through fifth toenails Ulcer Exam: There is no evidence of ulcer or pre-ulcerative changes or infection. Orthopedic Exam: Muscle tone and strength are WNL. No limitations in general ROM. No crepitus or effusions noted. Foot type and digits show no abnormalities. HAV  B/L. Skin: No Porokeratosis. No infection or ulcers  Diagnosis:  Onychomycosis, , Pain in right toe, pain in left toes  Treatment & Plan Procedures and Treatment: Consent by patient was obtained for treatment procedures.   Debridement of mycotic and hypertrophic toenails, 1 through 5 bilateral and clearing of subungual debris. No ulceration, no infection noted. ABN signed for 2020. Return Visit-Office Procedure: Patient instructed to return to the office for a follow up visit 3 months for continued evaluation and treatment.    Helane GuntherGregory Merlon Alcorta DPM

## 2018-06-27 ENCOUNTER — Ambulatory Visit (INDEPENDENT_AMBULATORY_CARE_PROVIDER_SITE_OTHER): Payer: Medicaid Other | Admitting: Podiatry

## 2018-06-27 ENCOUNTER — Other Ambulatory Visit: Payer: Self-pay

## 2018-06-27 ENCOUNTER — Encounter: Payer: Self-pay | Admitting: Podiatry

## 2018-06-27 DIAGNOSIS — M79675 Pain in left toe(s): Secondary | ICD-10-CM

## 2018-06-27 DIAGNOSIS — L84 Corns and callosities: Secondary | ICD-10-CM

## 2018-06-27 DIAGNOSIS — D689 Coagulation defect, unspecified: Secondary | ICD-10-CM

## 2018-06-27 DIAGNOSIS — M79674 Pain in right toe(s): Secondary | ICD-10-CM

## 2018-06-27 DIAGNOSIS — B351 Tinea unguium: Secondary | ICD-10-CM

## 2018-06-27 NOTE — Progress Notes (Signed)
Complaint:  Visit Type: Patient returns to my office for continued preventative foot care services. 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   The patient presents for preventative foot care services. No changes to ROS.  Patient is taking plavix. Painful callus tip second toe right foot.  Podiatric Exam: Vascular: dorsalis pedis and posterior tibial pulses are palpable bilateral. Capillary return is immediate. Temperature gradient is WNL. Skin turgor WNL  Sensorium: Normal Semmes Weinstein monofilament test.right foot.  LOPS diminished left foot  Normal tactile sensation bilaterally. Nail Exam: Pt has thick disfigured discolored nails with subungual debris noted bilateral entire nail hallux through fifth toenails Ulcer Exam: There is no evidence of ulcer or pre-ulcerative changes or infection. Orthopedic Exam: Muscle tone and strength are WNL. No limitations in general ROM. No crepitus or effusions noted. Foot type and digits show no abnormalities. HAV  B/L. Skin: No Porokeratosis. No infection or ulcers  Diagnosis:  Onychomycosis, , Pain in right toe, pain in left toes. Clavi  Treatment & Plan Procedures and Treatment: Consent by patient was obtained for treatment procedures.   Debridement of mycotic and hypertrophic toenails, 1 through 5 bilateral and clearing of subungual debris. No ulceration, no infection noted. ABN signed for 2020. Debride clavi second toe right foot Return Visit-Office Procedure: Patient instructed to return to the office for a follow up visit 3 months for continued evaluation and treatment.    Helane Gunther DPM

## 2018-06-29 ENCOUNTER — Ambulatory Visit: Payer: Medicaid Other | Admitting: Podiatry

## 2018-09-26 ENCOUNTER — Other Ambulatory Visit: Payer: Self-pay

## 2018-09-26 ENCOUNTER — Encounter: Payer: Self-pay | Admitting: Podiatry

## 2018-09-26 ENCOUNTER — Ambulatory Visit (INDEPENDENT_AMBULATORY_CARE_PROVIDER_SITE_OTHER): Payer: Medicaid Other | Admitting: Podiatry

## 2018-09-26 VITALS — Temp 97.2°F

## 2018-09-26 DIAGNOSIS — L84 Corns and callosities: Secondary | ICD-10-CM

## 2018-09-26 DIAGNOSIS — M79675 Pain in left toe(s): Secondary | ICD-10-CM

## 2018-09-26 DIAGNOSIS — B351 Tinea unguium: Secondary | ICD-10-CM

## 2018-09-26 DIAGNOSIS — M79674 Pain in right toe(s): Secondary | ICD-10-CM | POA: Diagnosis not present

## 2018-09-26 DIAGNOSIS — D689 Coagulation defect, unspecified: Secondary | ICD-10-CM

## 2018-09-26 NOTE — Progress Notes (Signed)
Complaint:  Visit Type: Patient returns to my office for continued preventative foot care services. 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   The patient presents for preventative foot care services. No changes to ROS.  Patient is taking plavix. Painful callus tip second toe right foot.  Podiatric Exam: Vascular: dorsalis pedis and posterior tibial pulses are palpable bilateral. Capillary return is immediate. Temperature gradient is WNL. Skin turgor WNL  Sensorium: Normal Semmes Weinstein monofilament test.right foot.  LOPS diminished left foot  Normal tactile sensation bilaterally. Nail Exam: Pt has thick disfigured discolored nails with subungual debris noted bilateral entire nail hallux through fifth toenails Ulcer Exam: There is no evidence of ulcer or pre-ulcerative changes or infection. Orthopedic Exam: Muscle tone and strength are WNL. No limitations in general ROM. No crepitus or effusions noted. Foot type and digits show no abnormalities. HAV  B/L. Skin: No Porokeratosis. No infection or ulcers  Diagnosis:  Onychomycosis, , Pain in right toe, pain in left toes. Clavi second right.  Treatment & Plan Procedures and Treatment: Consent by patient was obtained for treatment procedures.   Debridement of mycotic and hypertrophic toenails, 1 through 5 bilateral and clearing of subungual debris. No ulceration, no infection noted.  Debride clavi second toe right foot Return Visit-Office Procedure: Patient instructed to return to the office for a follow up visit 3 months for continued evaluation and treatment.    Gardiner Barefoot DPM

## 2019-01-02 ENCOUNTER — Other Ambulatory Visit: Payer: Self-pay

## 2019-01-02 ENCOUNTER — Ambulatory Visit (INDEPENDENT_AMBULATORY_CARE_PROVIDER_SITE_OTHER): Payer: Medicaid Other | Admitting: Podiatry

## 2019-01-02 ENCOUNTER — Encounter: Payer: Self-pay | Admitting: Podiatry

## 2019-01-02 DIAGNOSIS — M79675 Pain in left toe(s): Secondary | ICD-10-CM | POA: Diagnosis not present

## 2019-01-02 DIAGNOSIS — B351 Tinea unguium: Secondary | ICD-10-CM

## 2019-01-02 DIAGNOSIS — L84 Corns and callosities: Secondary | ICD-10-CM

## 2019-01-02 DIAGNOSIS — M79674 Pain in right toe(s): Secondary | ICD-10-CM

## 2019-01-02 DIAGNOSIS — D689 Coagulation defect, unspecified: Secondary | ICD-10-CM

## 2019-01-02 NOTE — Progress Notes (Signed)
Complaint:  Visit Type: Patient returns to my office for continued preventative foot care services. 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   The patient presents for preventative foot care services. No changes to ROS.  Patient is taking plavix. Painful callus tip second toe right foot.  Podiatric Exam: Vascular: dorsalis pedis and posterior tibial pulses are palpable bilateral. Capillary return is immediate. Temperature gradient is WNL. Skin turgor WNL  Sensorium: Normal Semmes Weinstein monofilament test.right foot.  LOPS diminished left foot  Normal tactile sensation bilaterally. Nail Exam: Pt has thick disfigured discolored nails with subungual debris noted bilateral entire nail hallux through fifth toenails Ulcer Exam: There is no evidence of ulcer or pre-ulcerative changes or infection. Orthopedic Exam: Muscle tone and strength are WNL. No limitations in general ROM. No crepitus or effusions noted. Foot type and digits show no abnormalities. HAV  B/L. Skin: No Porokeratosis. No infection or ulcers  Diagnosis:  Onychomycosis, , Pain in right toe, pain in left toes. Clavi second right.  Treatment & Plan Procedures and Treatment: Consent by patient was obtained for treatment procedures.   Debridement of mycotic and hypertrophic toenails, 1 through 5 bilateral and clearing of subungual debris. No ulceration, no infection noted.  Debride clavi second toe right foot Return Visit-Office Procedure: Patient instructed to return to the office for a follow up visit 3 months for continued evaluation and treatment.    Gardiner Barefoot DPM

## 2019-03-20 ENCOUNTER — Ambulatory Visit (INDEPENDENT_AMBULATORY_CARE_PROVIDER_SITE_OTHER): Payer: Medicaid Other | Admitting: Podiatry

## 2019-03-20 ENCOUNTER — Encounter: Payer: Self-pay | Admitting: Podiatry

## 2019-03-20 ENCOUNTER — Other Ambulatory Visit: Payer: Self-pay

## 2019-03-20 DIAGNOSIS — B351 Tinea unguium: Secondary | ICD-10-CM | POA: Diagnosis not present

## 2019-03-20 DIAGNOSIS — M79674 Pain in right toe(s): Secondary | ICD-10-CM

## 2019-03-20 DIAGNOSIS — D689 Coagulation defect, unspecified: Secondary | ICD-10-CM

## 2019-03-20 DIAGNOSIS — M79675 Pain in left toe(s): Secondary | ICD-10-CM

## 2019-03-20 DIAGNOSIS — L84 Corns and callosities: Secondary | ICD-10-CM

## 2019-03-20 NOTE — Progress Notes (Signed)
Complaint:  Visit Type: Patient returns to my office for continued preventative foot care services. 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   The patient presents for preventative foot care services. No changes to ROS.  Patient is taking plavix. Painful callus tip second toe right foot.  Podiatric Exam: Vascular: dorsalis pedis and posterior tibial pulses are palpable bilateral. Capillary return is immediate. Temperature gradient is WNL. Skin turgor WNL  Sensorium: Normal Semmes Weinstein monofilament test.right foot.  LOPS diminished left foot  Normal tactile sensation bilaterally. Nail Exam: Pt has thick disfigured discolored nails with subungual debris noted bilateral entire nail hallux through fifth toenails Ulcer Exam: There is no evidence of ulcer or pre-ulcerative changes or infection. Orthopedic Exam: Muscle tone and strength are WNL. No limitations in general ROM. No crepitus or effusions noted. Foot type and digits show no abnormalities. HAV  B/L. Skin: No Porokeratosis. No infection or ulcers  Diagnosis:  Onychomycosis, , Pain in right toe, pain in left toes. Clavi second right.  Treatment & Plan Procedures and Treatment: Consent by patient was obtained for treatment procedures.   Debridement of mycotic and hypertrophic toenails, 1 through 5 bilateral and clearing of subungual debris. No ulceration, no infection noted.  Debride clavi second toe right foot.  Padding dispensed. Return Visit-Office Procedure: Patient instructed to return to the office for a follow up visit 10 weeks  for continued evaluation and treatment.    Helane Gunther DPM

## 2019-06-07 ENCOUNTER — Encounter: Payer: Self-pay | Admitting: Podiatry

## 2019-06-07 ENCOUNTER — Other Ambulatory Visit: Payer: Self-pay

## 2019-06-07 ENCOUNTER — Ambulatory Visit (INDEPENDENT_AMBULATORY_CARE_PROVIDER_SITE_OTHER): Payer: Medicare Other | Admitting: Podiatry

## 2019-06-07 DIAGNOSIS — L84 Corns and callosities: Secondary | ICD-10-CM | POA: Diagnosis not present

## 2019-06-07 DIAGNOSIS — D689 Coagulation defect, unspecified: Secondary | ICD-10-CM

## 2019-06-07 DIAGNOSIS — M79675 Pain in left toe(s): Secondary | ICD-10-CM

## 2019-06-07 DIAGNOSIS — B351 Tinea unguium: Secondary | ICD-10-CM | POA: Diagnosis not present

## 2019-06-07 DIAGNOSIS — M79674 Pain in right toe(s): Secondary | ICD-10-CM | POA: Diagnosis not present

## 2019-06-08 NOTE — Progress Notes (Addendum)
This patient returns to my office for at risk foot care.  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  Patient is taking plavix.  This patient is unable to cut nails herself since the patient cannot reach her  nails.These nails are painful walking and wearing shoes.  This patient presents for at risk foot care today.  Patient also has painful callus on the tips of right  second toes.  General Appearance  Alert, conversant and in no acute stress.  Vascular  Dorsalis pedis and posterior tibial  pulses are palpable  bilaterally.  Capillary return is within normal limits  bilaterally. Temperature is within normal limits  bilaterally.  Neurologic  Senn-Weinstein monofilament wire test within normal limits right foot.  LOPS diminished left foot.. Muscle power within normal limits bilaterally.  Nails Thick disfigured discolored nails with subungual debris  from hallux to fifth toes bilaterally. No evidence of bacterial infection or drainage bilaterally.  Orthopedic  No limitations of motion  feet .  No crepitus or effusions noted.  No bony pathology or digital deformities noted.HAV  B/L.    Skin  normotropic skin with no porokeratosis noted bilaterally.  No signs of infections or ulcers noted.   Clavi right foot.  Onychomycosis  Pain in right toes  Pain in left toes  Clavi second right  Consent was obtained for treatment procedures.   Mechanical debridement of nails 1-5  bilaterally performed with a nail nipper.  Filed with dremel without incident. Debride clavi with # 15  Blade.   Return office visit   10 weeks                   Told patient to return for periodic foot care and evaluation due to potential at risk complications.   Helane Gunther DPM

## 2019-08-21 ENCOUNTER — Other Ambulatory Visit: Payer: Self-pay

## 2019-08-21 ENCOUNTER — Encounter: Payer: Self-pay | Admitting: Podiatry

## 2019-08-21 ENCOUNTER — Ambulatory Visit (INDEPENDENT_AMBULATORY_CARE_PROVIDER_SITE_OTHER): Payer: Medicare Other | Admitting: Podiatry

## 2019-08-21 DIAGNOSIS — M79674 Pain in right toe(s): Secondary | ICD-10-CM

## 2019-08-21 DIAGNOSIS — D689 Coagulation defect, unspecified: Secondary | ICD-10-CM

## 2019-08-21 DIAGNOSIS — M79675 Pain in left toe(s): Secondary | ICD-10-CM

## 2019-08-21 DIAGNOSIS — B351 Tinea unguium: Secondary | ICD-10-CM

## 2019-08-21 NOTE — Progress Notes (Signed)
This patient returns to my office for at risk foot care.  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  Patient is taking plavix.  This patient is unable to cut nails herself since the patient cannot reach her  nails.These nails are painful walking and wearing shoes.  This patient presents for at risk foot care today.  Patient also has  callus on the tips of right  second toes.  General Appearance  Alert, conversant and in no acute stress.  Vascular  Dorsalis pedis and posterior tibial  pulses are palpable  bilaterally.  Capillary return is within normal limits  bilaterally. Temperature is within normal limits  bilaterally.  Neurologic  Senn-Weinstein monofilament wire test within normal limits right foot.  LOPS diminished left foot.. Muscle power within normal limits bilaterally.  Nails Thick disfigured discolored nails with subungual debris  from hallux to fifth toes bilaterally. No evidence of bacterial infection or drainage bilaterally.  Orthopedic  No limitations of motion  feet .  No crepitus or effusions noted.  No bony pathology or digital deformities noted.HAV  B/L.    Skin  normotropic skin with no porokeratosis noted bilaterally.  No signs of infections or ulcers noted.   Clavi right foot.  Onychomycosis  Pain in right toes  Pain in left toes  Clavi second right  Consent was obtained for treatment procedures.   Mechanical debridement of nails 1-5  bilaterally performed with a nail nipper.  Filed with dremel without incident.    Return office visit   12 weeks                   Told patient to return for periodic foot care and evaluation due to potential at risk complications.   Helane Gunther DPM

## 2019-11-26 ENCOUNTER — Ambulatory Visit: Payer: Medicare Other | Admitting: Podiatry

## 2020-02-25 ENCOUNTER — Encounter: Payer: Self-pay | Admitting: Podiatry

## 2020-02-25 ENCOUNTER — Ambulatory Visit (INDEPENDENT_AMBULATORY_CARE_PROVIDER_SITE_OTHER): Payer: Medicare Other | Admitting: Podiatry

## 2020-02-25 ENCOUNTER — Other Ambulatory Visit: Payer: Self-pay

## 2020-02-25 DIAGNOSIS — D689 Coagulation defect, unspecified: Secondary | ICD-10-CM

## 2020-02-25 DIAGNOSIS — M79674 Pain in right toe(s): Secondary | ICD-10-CM

## 2020-02-25 DIAGNOSIS — B351 Tinea unguium: Secondary | ICD-10-CM | POA: Diagnosis not present

## 2020-02-25 DIAGNOSIS — M79675 Pain in left toe(s): Secondary | ICD-10-CM

## 2020-02-25 DIAGNOSIS — L84 Corns and callosities: Secondary | ICD-10-CM

## 2020-02-25 NOTE — Progress Notes (Signed)
This patient returns to my office for at risk foot care.  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  Patient is taking plavix.  This patient is unable to cut nails herself since the patient cannot reach her  nails.These nails are painful walking and wearing shoes.  This patient presents for at risk foot care today.  Patient also has  callus on the tips of right  second toes.  General Appearance  Alert, conversant and in no acute stress.  Vascular  Dorsalis pedis and posterior tibial  pulses are palpable  bilaterally.  Capillary return is within normal limits  bilaterally. Temperature is within normal limits  bilaterally.  Neurologic  Senn-Weinstein monofilament wire test within normal limits right foot.  LOPS diminished left foot.. Muscle power within normal limits bilaterally.  Nails Thick disfigured discolored nails with subungual debris  from hallux to fifth toes bilaterally. No evidence of bacterial infection or drainage bilaterally.  Orthopedic  No limitations of motion  feet .  No crepitus or effusions noted.  No bony pathology or digital deformities noted.HAV  B/L.    Skin  normotropic skin with no porokeratosis noted bilaterally.  No signs of infections or ulcers noted.   Clavi second toe right foot.  Onychomycosis  Pain in right toes  Pain in left toes  Clavi second right  Consent was obtained for treatment procedures.   Mechanical debridement of nails 1-5  bilaterally performed with a nail nipper.  Filed with dremel without incident.  Debride clavi second toe right foot.   Return office visit   12 weeks                   Told patient to return for periodic foot care and evaluation due to potential at risk complications.   Helane Gunther DPM

## 2020-03-27 ENCOUNTER — Ambulatory Visit: Admit: 2020-03-27 | Payer: Medicaid Other | Admitting: Oral Surgery

## 2020-03-27 SURGERY — DENTAL RESTORATION/EXTRACTIONS
Anesthesia: General

## 2020-06-03 ENCOUNTER — Other Ambulatory Visit: Payer: Self-pay

## 2020-06-03 ENCOUNTER — Ambulatory Visit (INDEPENDENT_AMBULATORY_CARE_PROVIDER_SITE_OTHER): Payer: Medicare Other | Admitting: Podiatry

## 2020-06-03 ENCOUNTER — Other Ambulatory Visit: Payer: Self-pay | Admitting: Podiatry

## 2020-06-03 ENCOUNTER — Encounter: Payer: Self-pay | Admitting: Podiatry

## 2020-06-03 ENCOUNTER — Ambulatory Visit (INDEPENDENT_AMBULATORY_CARE_PROVIDER_SITE_OTHER): Payer: Medicare Other

## 2020-06-03 DIAGNOSIS — M898X9 Other specified disorders of bone, unspecified site: Secondary | ICD-10-CM

## 2020-06-03 DIAGNOSIS — M79671 Pain in right foot: Secondary | ICD-10-CM

## 2020-06-03 DIAGNOSIS — M79674 Pain in right toe(s): Secondary | ICD-10-CM

## 2020-06-03 DIAGNOSIS — B351 Tinea unguium: Secondary | ICD-10-CM | POA: Diagnosis not present

## 2020-06-03 DIAGNOSIS — D689 Coagulation defect, unspecified: Secondary | ICD-10-CM

## 2020-06-03 DIAGNOSIS — M79675 Pain in left toe(s): Secondary | ICD-10-CM

## 2020-06-03 DIAGNOSIS — L84 Corns and callosities: Secondary | ICD-10-CM | POA: Diagnosis not present

## 2020-06-03 NOTE — Progress Notes (Signed)
This patient returns to my office for at risk foot care.  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  Patient is taking plavix.  This patient is unable to cut nails herself since the patient cannot reach her  nails.These nails are painful walking and wearing shoes.  This patient presents for at risk foot care today.  Patient also has  callus on the tips of right  second toe right foot which is extremely painful..  General Appearance  Alert, conversant and in no acute stress.  Vascular  Dorsalis pedis and posterior tibial  pulses are palpable  bilaterally.  Capillary return is within normal limits  bilaterally. Temperature is within normal limits  bilaterally.  Neurologic  Senn-Weinstein monofilament wire test within normal limits right foot.  LOPS diminished left foot.. Muscle power within normal limits bilaterally.  Nails Thick disfigured discolored nails with subungual debris  from hallux to fifth toes bilaterally. No evidence of bacterial infection or drainage bilaterally.  Orthopedic  No limitations of motion  feet .  No crepitus or effusions noted.  No bony pathology or digital deformities noted.HAV  B/L.    Skin  normotropic skin with no porokeratosis noted bilaterally.  No signs of infections or ulcers noted.   Clavi second toe right foot.  Onychomycosis  Pain in right toes  Pain in left toes  Clavi second right  Consent was obtained for treatment procedures.   Mechanical debridement of nails 1-5  bilaterally performed with a nail nipper.  Filed with dremel without incident.  Debride clavi second toe right foot. The clavi second toe right foot is thickened with callus tissue and pain is significant.  Therefore an xray was taken revealing an enlarged distal tuft distal phalanx second toe right foot.  The tuft appears to enlarged mushroom.  Patient has marked improvement of second toe after callus was  debrided.   Return office visit   12 weeks                   Told patient to return for periodic foot care and evaluation due to potential at risk complications.      Helane Gunther DPM

## 2020-09-04 ENCOUNTER — Ambulatory Visit: Payer: Medicare Other | Admitting: Podiatry

## 2021-01-15 ENCOUNTER — Other Ambulatory Visit: Payer: Self-pay

## 2021-01-15 ENCOUNTER — Ambulatory Visit (INDEPENDENT_AMBULATORY_CARE_PROVIDER_SITE_OTHER): Payer: Medicare Other | Admitting: Podiatry

## 2021-01-15 ENCOUNTER — Encounter: Payer: Self-pay | Admitting: Podiatry

## 2021-01-15 DIAGNOSIS — B351 Tinea unguium: Secondary | ICD-10-CM | POA: Diagnosis not present

## 2021-01-15 DIAGNOSIS — D689 Coagulation defect, unspecified: Secondary | ICD-10-CM | POA: Diagnosis not present

## 2021-01-15 DIAGNOSIS — M79675 Pain in left toe(s): Secondary | ICD-10-CM | POA: Diagnosis not present

## 2021-01-15 DIAGNOSIS — M79674 Pain in right toe(s): Secondary | ICD-10-CM | POA: Diagnosis not present

## 2021-01-15 DIAGNOSIS — M722 Plantar fascial fibromatosis: Secondary | ICD-10-CM | POA: Insufficient documentation

## 2021-01-15 NOTE — Progress Notes (Signed)
This patient returns to my office for at risk foot care.  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  Patient is taking plavix.  This patient is unable to cut nails herself since the patient cannot reach her  nails.These nails are painful walking and wearing shoes.  This patient presents for at risk foot care today.  Patient has pain in the arch right foot.    General Appearance  Alert, conversant and in no acute stress.  Vascular  Dorsalis pedis and posterior tibial  pulses are palpable  bilaterally.  Capillary return is within normal limits  bilaterally. Temperature is within normal limits  bilaterally.  Neurologic  Senn-Weinstein monofilament wire test within normal limits right foot.  LOPS diminished left foot.. Muscle power within normal limits bilaterally.  Nails Thick disfigured discolored nails with subungual debris  from hallux to fifth toes bilaterally. No evidence of bacterial infection or drainage bilaterally.  Orthopedic  No limitations of motion  feet .  No crepitus or effusions noted.  No bony pathology or digital deformities noted.HAV  B/L.    Skin  normotropic skin with no porokeratosis noted bilaterally.  No signs of infections or ulcers noted.     Onychomycosis  Pain in right toes  Pain in left toes  Clavi second right  Consent was obtained for treatment procedures.   Mechanical debridement of nails 1-5  bilaterally performed with a nail nipper.  Filed with dremel without incident. Padding applied to right shoe in arch.  Patient says it feels better.   Return office visit   12 weeks                   Told patient to return for periodic foot care and evaluation due to potential at risk complications.      Helane Gunther DPM

## 2021-01-28 ENCOUNTER — Inpatient Hospital Stay (HOSPITAL_COMMUNITY): Payer: Medicare Other

## 2021-01-28 ENCOUNTER — Other Ambulatory Visit: Payer: Self-pay

## 2021-01-28 ENCOUNTER — Emergency Department (HOSPITAL_COMMUNITY): Payer: Medicare Other

## 2021-01-28 ENCOUNTER — Inpatient Hospital Stay (HOSPITAL_COMMUNITY)
Admission: EM | Admit: 2021-01-28 | Discharge: 2021-01-31 | DRG: 641 | Disposition: A | Discharge status: Home / Self Care | Payer: Medicare Other | Attending: Family Medicine | Admitting: Family Medicine

## 2021-01-28 DIAGNOSIS — F17210 Nicotine dependence, cigarettes, uncomplicated: Secondary | ICD-10-CM | POA: Diagnosis present

## 2021-01-28 DIAGNOSIS — I959 Hypotension, unspecified: Secondary | ICD-10-CM | POA: Diagnosis present

## 2021-01-28 DIAGNOSIS — Z888 Allergy status to other drugs, medicaments and biological substances status: Secondary | ICD-10-CM

## 2021-01-28 DIAGNOSIS — Z8673 Personal history of transient ischemic attack (TIA), and cerebral infarction without residual deficits: Secondary | ICD-10-CM | POA: Diagnosis not present

## 2021-01-28 DIAGNOSIS — Z833 Family history of diabetes mellitus: Secondary | ICD-10-CM

## 2021-01-28 DIAGNOSIS — J101 Influenza due to other identified influenza virus with other respiratory manifestations: Secondary | ICD-10-CM | POA: Diagnosis present

## 2021-01-28 DIAGNOSIS — Z9181 History of falling: Secondary | ICD-10-CM

## 2021-01-28 DIAGNOSIS — F4310 Post-traumatic stress disorder, unspecified: Secondary | ICD-10-CM | POA: Diagnosis present

## 2021-01-28 DIAGNOSIS — Z90710 Acquired absence of both cervix and uterus: Secondary | ICD-10-CM

## 2021-01-28 DIAGNOSIS — Z880 Allergy status to penicillin: Secondary | ICD-10-CM | POA: Diagnosis not present

## 2021-01-28 DIAGNOSIS — E0781 Sick-euthyroid syndrome: Secondary | ICD-10-CM | POA: Diagnosis present

## 2021-01-28 DIAGNOSIS — J111 Influenza due to unidentified influenza virus with other respiratory manifestations: Secondary | ICD-10-CM

## 2021-01-28 DIAGNOSIS — E860 Dehydration: Secondary | ICD-10-CM | POA: Diagnosis present

## 2021-01-28 DIAGNOSIS — I9589 Other hypotension: Secondary | ICD-10-CM | POA: Diagnosis not present

## 2021-01-28 DIAGNOSIS — Z823 Family history of stroke: Secondary | ICD-10-CM | POA: Diagnosis not present

## 2021-01-28 DIAGNOSIS — N179 Acute kidney failure, unspecified: Secondary | ICD-10-CM | POA: Diagnosis present

## 2021-01-28 DIAGNOSIS — E861 Hypovolemia: Secondary | ICD-10-CM

## 2021-01-28 DIAGNOSIS — J45909 Unspecified asthma, uncomplicated: Secondary | ICD-10-CM | POA: Diagnosis present

## 2021-01-28 DIAGNOSIS — R55 Syncope and collapse: Secondary | ICD-10-CM | POA: Diagnosis present

## 2021-01-28 DIAGNOSIS — D696 Thrombocytopenia, unspecified: Secondary | ICD-10-CM | POA: Diagnosis present

## 2021-01-28 DIAGNOSIS — N1831 Chronic kidney disease, stage 3a: Secondary | ICD-10-CM | POA: Diagnosis present

## 2021-01-28 DIAGNOSIS — D649 Anemia, unspecified: Secondary | ICD-10-CM | POA: Diagnosis present

## 2021-01-28 DIAGNOSIS — R52 Pain, unspecified: Secondary | ICD-10-CM

## 2021-01-28 DIAGNOSIS — M25571 Pain in right ankle and joints of right foot: Secondary | ICD-10-CM | POA: Diagnosis present

## 2021-01-28 DIAGNOSIS — R9431 Abnormal electrocardiogram [ECG] [EKG]: Secondary | ICD-10-CM | POA: Diagnosis present

## 2021-01-28 DIAGNOSIS — I69354 Hemiplegia and hemiparesis following cerebral infarction affecting left non-dominant side: Secondary | ICD-10-CM | POA: Diagnosis not present

## 2021-01-28 DIAGNOSIS — Z7902 Long term (current) use of antithrombotics/antiplatelets: Secondary | ICD-10-CM | POA: Diagnosis not present

## 2021-01-28 DIAGNOSIS — Z20822 Contact with and (suspected) exposure to covid-19: Secondary | ICD-10-CM | POA: Diagnosis present

## 2021-01-28 DIAGNOSIS — I129 Hypertensive chronic kidney disease with stage 1 through stage 4 chronic kidney disease, or unspecified chronic kidney disease: Secondary | ICD-10-CM | POA: Diagnosis present

## 2021-01-28 DIAGNOSIS — G8929 Other chronic pain: Secondary | ICD-10-CM | POA: Diagnosis present

## 2021-01-28 DIAGNOSIS — R571 Hypovolemic shock: Secondary | ICD-10-CM

## 2021-01-28 DIAGNOSIS — R531 Weakness: Secondary | ICD-10-CM | POA: Diagnosis present

## 2021-01-28 DIAGNOSIS — E876 Hypokalemia: Secondary | ICD-10-CM | POA: Diagnosis present

## 2021-01-28 DIAGNOSIS — Z8711 Personal history of peptic ulcer disease: Secondary | ICD-10-CM

## 2021-01-28 LAB — COMPREHENSIVE METABOLIC PANEL
ALT: 19 U/L (ref 0–44)
AST: 30 U/L (ref 15–41)
Albumin: 4.2 g/dL (ref 3.5–5.0)
Alkaline Phosphatase: 59 U/L (ref 38–126)
Anion gap: 13 (ref 5–15)
BUN: 18 mg/dL (ref 8–23)
CO2: 24 mmol/L (ref 22–32)
Calcium: 9.4 mg/dL (ref 8.9–10.3)
Chloride: 99 mmol/L (ref 98–111)
Creatinine, Ser: 2.17 mg/dL — ABNORMAL HIGH (ref 0.44–1.00)
GFR, Estimated: 24 mL/min — ABNORMAL LOW (ref >60)
Glucose, Bld: 110 mg/dL — ABNORMAL HIGH (ref 70–99)
Potassium: 2.9 mmol/L — ABNORMAL LOW (ref 3.5–5.1)
Sodium: 136 mmol/L (ref 135–145)
Total Bilirubin: 0.9 mg/dL (ref 0.3–1.2)
Total Protein: 7.6 g/dL (ref 6.5–8.1)

## 2021-01-28 LAB — TSH: TSH: 7.715 u[IU]/mL — ABNORMAL HIGH (ref 0.350–4.500)

## 2021-01-28 LAB — CBC WITH DIFFERENTIAL/PLATELET
Abs Immature Granulocytes: 0.05 10*3/uL (ref 0.00–0.07)
Basophils Absolute: 0 10*3/uL (ref 0.0–0.1)
Basophils Relative: 0 %
Eosinophils Absolute: 0.1 10*3/uL (ref 0.0–0.5)
Eosinophils Relative: 1 %
HCT: 41.2 % (ref 36.0–46.0)
Hemoglobin: 13.1 g/dL (ref 12.0–15.0)
Immature Granulocytes: 1 %
Lymphocytes Relative: 15 %
Lymphs Abs: 1.3 10*3/uL (ref 0.7–4.0)
MCH: 29.8 pg (ref 26.0–34.0)
MCHC: 31.8 g/dL (ref 30.0–36.0)
MCV: 93.6 fL (ref 80.0–100.0)
Monocytes Absolute: 0.6 10*3/uL (ref 0.1–1.0)
Monocytes Relative: 7 %
Neutro Abs: 6.6 10*3/uL (ref 1.7–7.7)
Neutrophils Relative %: 76 %
Platelets: 144 10*3/uL — ABNORMAL LOW (ref 150–400)
RBC: 4.4 MIL/uL (ref 3.87–5.11)
RDW: 13.1 % (ref 11.5–15.5)
WBC: 8.7 10*3/uL (ref 4.0–10.5)
nRBC: 0 % (ref 0.0–0.2)

## 2021-01-28 LAB — URINALYSIS, ROUTINE W REFLEX MICROSCOPIC
Bilirubin Urine: NEGATIVE
Glucose, UA: NEGATIVE mg/dL
Ketones, ur: NEGATIVE mg/dL
Leukocytes,Ua: NEGATIVE
Nitrite: NEGATIVE
Protein, ur: 30 mg/dL — AB
Specific Gravity, Urine: 1.012 (ref 1.005–1.030)
pH: 6 (ref 5.0–8.0)

## 2021-01-28 LAB — I-STAT VENOUS BLOOD GAS, ED
Acid-Base Excess: 2 mmol/L (ref 0.0–2.0)
Bicarbonate: 27.3 mmol/L (ref 20.0–28.0)
Calcium, Ion: 1.06 mmol/L — ABNORMAL LOW (ref 1.15–1.40)
HCT: 42 % (ref 36.0–46.0)
Hemoglobin: 14.3 g/dL (ref 12.0–15.0)
O2 Saturation: 90 %
Potassium: 4.4 mmol/L (ref 3.5–5.1)
Sodium: 138 mmol/L (ref 135–145)
TCO2: 29 mmol/L (ref 22–32)
pCO2, Ven: 43.2 mmHg — ABNORMAL LOW (ref 44.0–60.0)
pH, Ven: 7.408 (ref 7.250–7.430)
pO2, Ven: 58 mmHg — ABNORMAL HIGH (ref 32.0–45.0)

## 2021-01-28 LAB — I-STAT CHEM 8, ED
BUN: 25 mg/dL — ABNORMAL HIGH (ref 8–23)
Calcium, Ion: 1.06 mmol/L — ABNORMAL LOW (ref 1.15–1.40)
Chloride: 104 mmol/L (ref 98–111)
Creatinine, Ser: 2.2 mg/dL — ABNORMAL HIGH (ref 0.44–1.00)
Glucose, Bld: 99 mg/dL (ref 70–99)
HCT: 45 % (ref 36.0–46.0)
Hemoglobin: 15.3 g/dL — ABNORMAL HIGH (ref 12.0–15.0)
Potassium: 4.4 mmol/L (ref 3.5–5.1)
Sodium: 138 mmol/L (ref 135–145)
TCO2: 26 mmol/L (ref 22–32)

## 2021-01-28 LAB — CBG MONITORING, ED: Glucose-Capillary: 89 mg/dL (ref 70–99)

## 2021-01-28 LAB — LACTIC ACID, PLASMA: Lactic Acid, Venous: 1.8 mmol/L (ref 0.5–1.9)

## 2021-01-28 LAB — APTT: aPTT: 24 seconds (ref 24–36)

## 2021-01-28 LAB — RESP PANEL BY RT-PCR (FLU A&B, COVID) ARPGX2
Influenza A by PCR: POSITIVE — AB
Influenza B by PCR: NEGATIVE
SARS Coronavirus 2 by RT PCR: NEGATIVE

## 2021-01-28 LAB — PROTIME-INR
INR: 1.1 (ref 0.8–1.2)
Prothrombin Time: 14.4 seconds (ref 11.4–15.2)

## 2021-01-28 LAB — HIV ANTIBODY (ROUTINE TESTING W REFLEX): HIV Screen 4th Generation wRfx: NONREACTIVE

## 2021-01-28 LAB — T4, FREE: Free T4: 0.77 ng/dL (ref 0.61–1.12)

## 2021-01-28 LAB — MAGNESIUM: Magnesium: 1.8 mg/dL (ref 1.7–2.4)

## 2021-01-28 LAB — ETHANOL: Alcohol, Ethyl (B): <10 mg/dL (ref <10)

## 2021-01-28 LAB — AMMONIA: Ammonia: 18 umol/L (ref 9–35)

## 2021-01-28 MED ORDER — OSELTAMIVIR PHOSPHATE 75 MG PO CAPS
75.0000 mg | ORAL_CAPSULE | Freq: Once | ORAL | Status: AC
  Administered 2021-01-28: 23:00:00 75 mg via ORAL
  Filled 2021-01-28: qty 1

## 2021-01-28 MED ORDER — SODIUM CHLORIDE 0.9 % IV SOLN: Freq: Once | INTRAVENOUS | Status: DC

## 2021-01-28 MED ORDER — SODIUM CHLORIDE 0.9% FLUSH
3.0000 mL | Freq: Two times a day (BID) | INTRAVENOUS | Status: DC
  Administered 2021-01-29 – 2021-01-31 (×4): 3 mL via INTRAVENOUS

## 2021-01-28 MED ORDER — LACTATED RINGERS IV SOLN
INTRAVENOUS | Status: AC
Start: 2021-01-28 — End: 2021-01-29

## 2021-01-28 MED ORDER — OSELTAMIVIR PHOSPHATE 75 MG PO CAPS
75.0000 mg | ORAL_CAPSULE | Freq: Two times a day (BID) | ORAL | Status: DC
  Administered 2021-01-29: 75 mg via ORAL
  Filled 2021-01-28 (×2): qty 1

## 2021-01-28 MED ORDER — HEPARIN SODIUM (PORCINE) 5000 UNIT/ML IJ SOLN
5000.0000 [IU] | Freq: Three times a day (TID) | INTRAMUSCULAR | Status: DC
  Administered 2021-01-28 – 2021-01-31 (×8): 5000 [IU] via SUBCUTANEOUS
  Filled 2021-01-28 (×8): qty 1

## 2021-01-28 MED ORDER — POTASSIUM CHLORIDE 10 MEQ/100ML IV SOLN
10.0000 meq | INTRAVENOUS | Status: AC
  Administered 2021-01-28 (×2): 10 meq via INTRAVENOUS
  Filled 2021-01-28 (×2): qty 100

## 2021-01-28 MED ORDER — SODIUM CHLORIDE 0.9 % IV BOLUS (SEPSIS)
2000.0000 mL | Freq: Once | INTRAVENOUS | Status: AC
  Administered 2021-01-28: 17:00:00 2000 mL via INTRAVENOUS

## 2021-01-28 NOTE — ED Notes (Addendum)
Family at bedside denies any needs at this time.

## 2021-01-28 NOTE — ED Notes (Signed)
Pt adjusted in bed and sheets changed at this time

## 2021-01-28 NOTE — ED Provider Notes (Signed)
[facility] EMERGENCY DEPARTMENT Provider Note   CSN: [id number] Arrival date & time: 01/28/21  1603     History No chief complaint on file.   [name] is a 65 y.o. female.  65 yo F with a cc of a syncopal event.  The patient unfortunately has been ill over the past week or so.  Multiple close contacts with influenza.  She had an episode today where she ended up collapsing at home.  Per EMS she became unresponsive and landed flat on her face.  She was responsive when they got there but she lost consciousness again upon arrival to the ED.  She stops breathing as well and received bag-valve-mask.  IV access was initiated but the had difficulty pushing fluid through it.  No hypoxia reported by them.  Blood sugar was normal.  The history is provided by the patient.  Illness Severity:  Moderate Onset quality:  Gradual Duration:  2 days Timing:  Constant Progression:  Worsening Chronicity:  New Associated symptoms: no chest pain, no congestion, no fever, no headaches, no myalgias, no nausea, no rhinorrhea, no shortness of breath, no vomiting and no wheezing       Past Medical History:  Diagnosis Date   Alcohol abuse    Chronic back pain    Colitis    CVA (cerebral infarction)    Fatty liver    Gastric ulcer    GI bleed    Hepatitis    Hepatitis C   MDD (major depressive disorder)    Pancreatitis    PTSD (post-traumatic stress disorder)    Seizures (HCC)    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) 11/05/2015   Stroke ([place])    UTI (lower urinary tract infection)    Vision abnormalities     Patient Active Problem List   Diagnosis Date Noted   Hypotension 01/28/2021   Plantar fasciitis of right foot 01/15/2021   Hyperlipidemia 11/03/2016   Former smoker 01/11/2016   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/05/2015   Left hip pain    History of GI bleed    Spastic bladder    Chronic back pain    ETOH  abuse    Hypokalemia    PTSD (post-traumatic stress disorder)    Gastroesophageal reflux disease without esophagitis    Acute ischemic stroke ([place]) 09/27/2015   Acute CVA (cerebrovascular accident) ([place]) 09/27/2015   Smoker    Left-sided weakness 09/26/2015   Acute gastric ulcer    GI bleed 05/22/2014   Acute GI bleeding 05/22/2014   Acute blood loss anemia 05/22/2014   History of stroke 05/22/2014   Mixed hyperlipidemia 05/22/2014   Gastrointestinal hemorrhage with melena    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 cerebral artery ([place]) 10/09/2012   Flank pain 10/09/2012   Right pontine stroke ([place]) 10/09/2012   ABDOMINAL PAIN OTHER SPECIFIED SITE 05/17/2010   ALCOHOL ABUSE 06/16/2007   COCAINE ABUSE 06/16/2007   DEPRESSION 06/16/2007   Essential hypertension 06/16/2007   CEREBROVASCULAR ACCIDENT 06/16/2007   ASTHMA 06/16/2007   GERD 06/16/2007   PANCREATITIS 11/09/2006   ESOPHAGITIS, REFLUX 05/23/2006    Past Surgical History:  Procedure Laterality Date   ABDOMINAL HYSTERECTOMY     APPENDECTOMY     COLONOSCOPY WITH PROPOFOL N/A 08/13/2015   Procedure: COLONOSCOPY WITH PROPOFOL;  Surgeon: Milus Banister, MD;  Location: [facility];  Service: Endoscopy;  Laterality: N/A;   ESOPHAGOGASTRODUODENOSCOPY N/A 05/23/2014  Procedure: ESOPHAGOGASTRODUODENOSCOPY (EGD);  Surgeon: Jerene Bears, MD;  Location: [facility] ENDOSCOPY;  Service: Endoscopy;  Laterality: N/A;   ESOPHAGOGASTRODUODENOSCOPY (EGD) WITH PROPOFOL N/A 08/13/2015   Procedure: ESOPHAGOGASTRODUODENOSCOPY (EGD) WITH PROPOFOL;  Surgeon: Milus Banister, MD;  Location: [facility] ENDOSCOPY;  Service: Endoscopy;  Laterality: N/A;   ESOPHAGOGASTRODUODENOSCOPY ENDOSCOPY     gall stone removed       OB History   No obstetric history on file.     Family History  Problem Relation Age of Onset   Stroke Mother    Cancer Sister        type unknown   Diabetes Father     Social History   Tobacco Use   Smoking status:  Every Day    Packs/day: 0.12    Types: Cigarettes    Last attempt to quit: 09/26/2015    Years since quitting: 5.3   Smokeless tobacco: Former  Substance Use Topics   Alcohol use: No    Alcohol/week: 0.0 standard drinks   Drug use: No    Home Medications Prior to Admission medications   Medication Sig Start Date End Date Taking? Authorizing Provider  albuterol (PROVENTIL HFA;VENTOLIN HFA) 108 (90 BASE) MCG/ACT inhaler Inhale 2 puffs into the lungs every 8 (eight) hours as needed for wheezing or shortness of breath.    [provider]  atorvastatin (LIPITOR) 20 MG tablet TK 1 T PO D 03/08/18   [provider]  clindamycin (CLEOCIN) 300 MG capsule Take 300 capsules every 6 (six) hours as needed by mouth. 01/13/17   [provider]  clopidogrel (PLAVIX) 75 MG tablet Take 1 tablet (75 mg total) by mouth daily. 11/03/16   Dennie Bible, NP  cycloSPORINE (RESTASIS) 0.05 % ophthalmic emulsion Place 1 drop into both eyes 2 (two) times daily. 10/26/15   Angiulli, Lavon Paganini, PA-C  esomeprazole (NEXIUM) 40 MG capsule Take 40 mg daily by mouth. 12/26/16   [provider]  famotidine (PEPCID) 20 MG tablet Take 1 tablet (20 mg total) by mouth 2 (two) times daily. 10/26/15   Angiulli, Lavon Paganini, PA-C  fluticasone (FLONASE) 50 MCG/ACT nasal spray Place 1 spray into the nose daily as needed for allergies.  06/04/15   [provider]  folic acid (FOLVITE) 1 MG tablet TK 1 T PO D 08/28/18   [provider]  gabapentin (NEURONTIN) 300 MG capsule Take 1 capsule (300 mg total) by mouth 3 (three) times daily. Patient taking differently: Take 300 mg by mouth 3 (three) times daily. Filled 01/18/2017 10/26/15   Angiulli, Lavon Paganini, PA-C  hydrochlorothiazide (HYDRODIURIL) 25 MG tablet Take 25 mg by mouth daily. 09/20/18   [provider]  ibuprofen (ADVIL,MOTRIN) 800 MG tablet Take 800 mg as needed by mouth. 12/21/16   [provider]  lidocaine  (LIDODERM) 5 % Place 1 patch onto the skin daily. Remove & Discard patch within 12 hours or as directed by MD 02/28/17   Tasia Catchings, Amy V, PA-C  lisinopril (PRINIVIL,ZESTRIL) 20 MG tablet Take 20 mg daily by mouth. 11/15/16   [provider]  naproxen (NAPROSYN) 500 MG tablet Take 500 mg 2 (two) times daily as needed by mouth. 11/28/16   [provider]  omeprazole (PRILOSEC) 40 MG capsule TK 1 C PO D 03/12/18   [provider]  oxybutynin (DITROPAN-XL) 5 MG 24 hr tablet TAKE 1 TABLET BY MOUTH AT BEDTIME. 11/06/15   Jamse Arn, MD  pantoprazole (PROTONIX) 40 MG tablet Take 1  tablet (40 mg total) by mouth 2 (two) times daily before a meal. 10/26/15   Angiulli, Mcarthur Rossetti, PA-C  polyethylene glycol powder (GLYCOLAX/MIRALAX) powder Take 17 g 2 (two) times daily by mouth. 01/21/17   Muthersbaugh, Dahlia Client, PA-C  ranitidine (ZANTAC) 150 MG tablet Take 1 tablet (150 mg total) 2 (two) times daily by mouth. 01/21/17   Muthersbaugh, Dahlia Client, PA-C  rOPINIRole (REQUIP) 0.25 MG tablet Take 1 tablet (0.25 mg total) by mouth 3 (three) times daily. 12/02/15   Kirsteins, Victorino Sparrow, MD  senna-docusate (SENOKOT-S) 8.6-50 MG tablet Take 1 tablet by mouth 2 (two) times daily. 11/05/15   Marcello Fennel, MD  sertraline (ZOLOFT) 50 MG tablet Take 1 tablet (50 mg total) by mouth daily. 10/26/15   Angiulli, Mcarthur Rossetti, PA-C  temazepam (RESTORIL) 15 MG capsule Take 15 mg by mouth at bedtime.    [provider]  thiamine (VITAMIN B-1) 100 MG tablet TK 1 T PO D 08/28/18   [provider]  traMADol (ULTRAM) 50 MG tablet TK 1 T PO BID 03/21/18   [provider]  traMADol-acetaminophen (ULTRACET) 37.5-325 MG tablet Take 1 tablet by mouth every 4 (four) hours as needed for moderate pain. 02/02/16   Kirsteins, Victorino Sparrow, MD  Travoprost, BAK Free, (TRAVATAN) 0.004 % SOLN ophthalmic solution Place 1 drop into both eyes at bedtime.     [provider]  traZODone (DESYREL) 50 MG tablet Take  50 mg at bedtime by mouth. 01/18/17   [provider]  UNABLE TO FIND TK 1 T PO D 11/07/18   [provider]    Allergies    Banana, Penicillins, and Simbrinza [brinzolamide-brimonidine]  Review of Systems   Review of Systems  Constitutional:  Negative for chills and fever.  HENT:  Negative for congestion and rhinorrhea.   Eyes:  Negative for redness and visual disturbance.  Respiratory:  Negative for shortness of breath and wheezing.   Cardiovascular:  Negative for chest pain and palpitations.  Gastrointestinal:  Negative for nausea and vomiting.  Genitourinary:  Negative for dysuria and urgency.  Musculoskeletal:  Negative for arthralgias and myalgias.  Skin:  Negative for pallor and wound.  Neurological:  Negative for dizziness and headaches.   Physical Exam Updated Vital Signs BP (!) 104/58   Pulse 65   Temp (!) 96.8 F (36 C) (Rectal)   Resp 17   SpO2 99%   Physical Exam Vitals and nursing note reviewed.  Constitutional:      General: She is not in acute distress.    Appearance: She is well-developed. She is not diaphoretic.  HENT:     Head: Normocephalic and atraumatic.  Eyes:     Pupils: Pupils are equal, round, and reactive to light.  Cardiovascular:     Rate and Rhythm: Normal rate and regular rhythm.     Heart sounds: No murmur heard.   No friction rub. No gallop.  Pulmonary:     Effort: Pulmonary effort is normal.     Breath sounds: No wheezing or rales.  Abdominal:     General: There is no distension.     Palpations: Abdomen is soft.     Tenderness: There is no abdominal tenderness.  Musculoskeletal:        General: No tenderness.     Cervical back: Normal range of motion and neck supple.  Skin:    General: Skin is warm and dry.  Neurological:     Mental Status: She is alert.  Comments: Confused response.  Psychiatric:        Behavior: Behavior normal.    ED Results / Procedures / Treatments   Labs (all labs ordered are  listed, but only abnormal results are displayed) Labs Reviewed  RESP PANEL BY RT-PCR (FLU A&B, COVID) ARPGX2 - Abnormal; Notable for the following components:      Result Value   Influenza A by PCR POSITIVE (*)    All other components within normal limits  COMPREHENSIVE METABOLIC PANEL - Abnormal; Notable for the following components:   Potassium 2.9 (*)    Glucose, Bld 110 (*)    Creatinine, Ser 2.17 (*)    GFR, Estimated 24 (*)    All other components within normal limits  CBC WITH DIFFERENTIAL/PLATELET - Abnormal; Notable for the following components:   Platelets 144 (*)    All other components within normal limits  TSH - Abnormal; Notable for the following components:   TSH 7.715 (*)    All other components within normal limits  I-STAT CHEM 8, ED - Abnormal; Notable for the following components:   BUN 25 (*)    Creatinine, Ser 2.20 (*)    Calcium, Ion 1.06 (*)    Hemoglobin 15.3 (*)    All other components within normal limits  I-STAT VENOUS BLOOD GAS, ED - Abnormal; Notable for the following components:   pCO2, Ven 43.2 (*)    pO2, Ven 58.0 (*)    Calcium, Ion 1.06 (*)    All other components within normal limits  CULTURE, BLOOD (ROUTINE X 2)  CULTURE, BLOOD (ROUTINE X 2)  URINE CULTURE  LACTIC ACID, PLASMA  PROTIME-INR  APTT  ETHANOL  T4, FREE  LACTIC ACID, PLASMA  URINALYSIS, ROUTINE W REFLEX MICROSCOPIC  BLOOD GAS, VENOUS  AMMONIA  CBG MONITORING, ED    EKG EKG Interpretation  Date/Time:  Thursday January 28 2021 16:04:32 EST Ventricular Rate:  61 PR Interval:  132 QRS Duration: 89 QT Interval:  499 QTC Calculation: 503 R Axis:   74 Text Interpretation: Sinus rhythm Prominent P waves, nondiagnostic Prolonged QT interval Otherwise no significant change Confirmed by Deno Etienne [phone]) on 01/28/2021 4:18:24 PM  Radiology CT Head Wo Contrast  Result Date: 01/28/2021 CLINICAL DATA:  Syncope.  Delirium. EXAM: CT HEAD WITHOUT CONTRAST TECHNIQUE:  Contiguous axial images were obtained from the base of the skull through the vertex without intravenous contrast. COMPARISON:  09/26/2015 FINDINGS: Brain: Periventricular low-density on the right could reflect lacunar infarct or chronic small vessel disease. This appears chronic. No acute intracranial abnormality. Specifically, no hemorrhage, hydrocephalus, mass lesion, acute infarction, or significant intracranial injury. Vascular: No hyperdense vessel or unexpected calcification. Skull: No acute calvarial abnormality. Sinuses/Orbits: Mucosal thickening in the left maxillary sinus and ethmoid air cells. No air-fluid levels. Other: None IMPRESSION: Chronic small vessel disease versus chronic right periventricular lacunar infarct. No acute intracranial abnormality. Chronic sinusitis. Electronically Signed   By: Rolm Baptise M.D.   On: 01/28/2021 18:23   CT Cervical Spine Wo Contrast  Result Date: 01/28/2021 CLINICAL DATA:  Syncope.  Neck trauma (Age >= 65y) EXAM: CT CERVICAL SPINE WITHOUT CONTRAST TECHNIQUE: Multidetector CT imaging of the cervical spine was performed without intravenous contrast. Multiplanar CT image reconstructions were also generated. COMPARISON:  None. FINDINGS: Alignment: Normal. Skull base and vertebrae: No acute fracture. No primary bone lesion or focal pathologic process. Soft tissues and spinal canal: No prevertebral fluid or swelling. No visible canal hematoma. Disc levels: Slight disc space narrowing and  early spurring at C5-6. Upper chest: No acute findings Other: None IMPRESSION: No acute bony abnormality. Electronically Signed   By: Charlett Nose M.D.   On: 01/28/2021 18:24   DG Chest Port 1 View  Result Date: 01/28/2021 CLINICAL DATA:  Questionable sepsis EXAM: PORTABLE CHEST 1 VIEW COMPARISON:  12/12/2015 FINDINGS: Heart and mediastinal contours are within normal limits. No focal opacities or effusions. No acute bony abnormality. IMPRESSION: No active disease. Electronically  Signed   By: Charlett Nose M.D.   On: 01/28/2021 18:42    Procedures Procedures  Procedure note: Ultrasound Guided Peripheral IV Ultrasound guided peripheral 1.88 inch angiocath IV placement performed by me. Indications: Nursing unable to place IV. Details: The antecubital fossa and upper arm were evaluated with a multifrequency linear probe. Patent brachial veins were noted. 1 attempt was made to cannulate a vein under realtime US guidance with successful cannulation of the vein and catheter placement. There is return of non-pulsatile dark red blood. The patient tolerated the procedure well without complications. Images archived electronically.  CPT codes: 79150 and [phone]  Medications Ordered in ED Medications  0.9 %  sodium chloride infusion (has no administration in time range)  oseltamivir (TAMIFLU) capsule 75 mg (has no administration in time range)  sodium chloride 0.9 % bolus 2,000 mL (0 mLs Intravenous Stopped 01/28/21 1739)    ED Course  I have reviewed the triage vital signs and the nursing notes.  Pertinent labs & imaging results that were available during my care of the patient were reviewed by me and considered in my medical decision making (see chart for details).    MDM Rules/Calculators/A&P                           65 yo F with a cc of a syncopal event.  Sick over the past week.  Multiple family members with influenza.  Patient was reportedly unresponsive but upon sternal rub she woke up spontaneously until me that hurt.  She has a history of a stroke and has chronic left-sided weakness.  We will obtain a laboratory evaluation bolus of IV fluids.  Hypotensive upon arrival with a blood pressure in the 80s systolic.  Work-up is largely unremarkable.  Patient has improved significantly with IV fluids.  Suspect her cause of hypotension was hypovolemia.  The patient is noted to have a MAP's <65/ SBP's <90. With the current information available to me, I don't think the  patient is in septic shock. The MAP's <65/ SBP's <90, is related to OTHER SHOCKhypovolemia .  Discussed with hospitalist for admission.   CRITICAL CARE Performed by: Rae Roam   Total critical care time: 35 minutes  Critical care time was exclusive of separately billable procedures and treating other patients.  Critical care was necessary to treat or prevent imminent or life-threatening deterioration.  Critical care was time spent personally by me on the following activities: development of treatment plan with patient and/or surrogate as well as nursing, discussions with consultants, evaluation of patient's response to treatment, examination of patient, obtaining history from patient or surrogate, ordering and performing treatments and interventions, ordering and review of laboratory studies, ordering and review of radiographic studies, pulse oximetry and re-evaluation of patient's condition.  The patients results and plan were reviewed and discussed.   Any x-rays performed were independently reviewed by myself.   Differential diagnosis were considered with the presenting HPI.  Medications  0.9 %  sodium chloride  infusion (has no administration in time range)  oseltamivir (TAMIFLU) capsule 75 mg (has no administration in time range)  sodium chloride 0.9 % bolus 2,000 mL (0 mLs Intravenous Stopped 01/28/21 1739)    Vitals:   01/28/21 1845 01/28/21 1900 01/28/21 1915 01/28/21 1930  BP: (!) 97/56 (!) 99/53 (!) 101/58 (!) 104/58  Pulse: 66 65 66 65  Resp: 17 17 17 17   Temp:      TempSrc:      SpO2: 97% 100% 99% 99%    Final diagnoses:  Syncope and collapse  Influenza-like illness  Hypovolemic shock (HCC)    Admission/ observation were discussed with the admitting physician, patient and/or family and they are comfortable with the plan.    Final Clinical Impression(s) / ED Diagnoses Final diagnoses:  Syncope and collapse  Influenza-like illness  Hypovolemic  shock [facility])    Rx / DC Orders ED Discharge Orders     None        Deno Etienne, DO 01/28/21 1952

## 2021-01-28 NOTE — ED Notes (Signed)
Radiology at bedside

## 2021-01-28 NOTE — ED Notes (Signed)
Received verbal report from Crystal B RN at this time 

## 2021-01-28 NOTE — H&P (Signed)
History and Physical    [name] CZY:[id number] DOB: 06-16-1955 DOA: 01/28/2021  PCP: Rometta Emery, MD  Patient coming from: Home  I have personally briefly reviewed patient's old medical records in [facility]  Chief Complaint: Syncope with loss of consciousness  HPI: [name] is a 65 y.o. female with medical history significant for CVA with left-sided hemiparesis, hypertension, asthma, GI bleed, PTSD, who presents with following a syncopal episode.  Patient has been feeling unwell for the past 3 days with coughing, runny nose, diarrhea.  Also had an episode of vomiting.  Family member at home has the flu.  She has been having decreased appetite and especially does not want to drink fluid since she does not want to get out of bed to use bathroom. Has felt hot but no objective fever. Today she remembers having a cup of coffee and then walking out of her room.  The Next thing she remembers afterwards is being on the ambulance.  Family found her face first on the ground and picked her up and brought her to bed.  They felt like she seemed confused and had labored respirations. She also notes right ankle pain at this time.  On EMS arrival, she was reported to have blood pressure in the systolic 70s.  Also reportedly became less responsive in the ambulance requiring bag-valve mask.In the ED, she was afebrile and hypotensive with blood pressure in 80s with improvement of 90s over 50s following 2 L IV fluid resuscitation. No leukocytosis or anemia.  Lactate 1.8.  Sodium of 136, K of 2.9, creatinine of 2.17, BG of 110. Influenza A PCR was positive.  COVID-negative.  Ammonia level was normal at 29.  EtOH was less than 10. TSH of 7.715 with normal free T4  CT head showing chronic vessel disease but no acute findings.  Negative C-spine.  Review of Systems: Constitutional: No Weight Change, + Fever ENT/Mouth: No sore throat, + Rhinorrhea Eyes: No Eye Pain, No Vision  Changes Cardiovascular: No Chest Pain, no SOB Respiratory: + Cough, No Sputum, No Wheezing, no Dyspnea  Gastrointestinal: No Nausea, + Vomiting, + Diarrhea, No Constipation, No Pain Genitourinary: no Urinary Incontinence Musculoskeletal: No Arthralgias, No Myalgias Skin: No Skin Lesions, No Pruritus, Neuro: no Weakness, No Numbness,  + Loss of Consciousness, + Syncope Psych: No Anxiety/Panic, No Depression, no decrease appetite Heme/Lymph: No Bruising, No Bleeding  Past Medical History:  Diagnosis Date   Alcohol abuse    Chronic back pain    Colitis    CVA (cerebral infarction)    Fatty liver    Gastric ulcer    GI bleed    Hepatitis    Hepatitis C   MDD (major depressive disorder)    Pancreatitis    PTSD (post-traumatic stress disorder)    Seizures (HCC)    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) 11/05/2015   Stroke (HCC)    UTI (lower urinary tract infection)    Vision abnormalities     Past Surgical History:  Procedure Laterality Date   ABDOMINAL HYSTERECTOMY     APPENDECTOMY     COLONOSCOPY WITH PROPOFOL N/A 08/13/2015   Procedure: COLONOSCOPY WITH PROPOFOL;  Surgeon: Rachael Fee, MD;  Location: [facility] ENDOSCOPY;  Service: Endoscopy;  Laterality: N/A;   ESOPHAGOGASTRODUODENOSCOPY N/A 05/23/2014   Procedure: ESOPHAGOGASTRODUODENOSCOPY (EGD);  Surgeon: Beverley Fiedler, MD;  Location: [facility] ENDOSCOPY;  Service: Endoscopy;  Laterality: N/A;   ESOPHAGOGASTRODUODENOSCOPY (EGD) WITH PROPOFOL N/A 08/13/2015   Procedure: ESOPHAGOGASTRODUODENOSCOPY (  EGD) WITH PROPOFOL;  Surgeon: Milus Banister, MD;  Location: [facility] ENDOSCOPY;  Service: Endoscopy;  Laterality: N/A;   ESOPHAGOGASTRODUODENOSCOPY ENDOSCOPY     gall stone removed       reports that she has been smoking cigarettes. She has been smoking an average of .12 packs per day. She has quit using smokeless tobacco. She reports that she does not drink alcohol and does not use drugs. Social History  Allergies  Allergen  Reactions   Banana Other (See Comments)    Welts on skin and sores in mouth   Penicillins Other (See Comments)    Blister Has patient had a PCN reaction causing immediate rash, facial/tongue/throat swelling, SOB or lightheadedness with hypotension: no Has patient had a PCN reaction causing severe rash involving mucus membranes or skin necrosis: yes - blisters Has patient had a PCN reaction that required hospitalization no Has patient had a PCN reaction occurring within the last 10 years: no If all of the above answers are "NO", then may proceed with Cephalosporin use.    Simbrinza [Brinzolamide-Brimonidine] Swelling    Eyes swell shut    Family History  Problem Relation Age of Onset   Stroke Mother    Cancer Sister        type unknown   Diabetes Father      Prior to Admission medications   Medication Sig Start Date End Date Taking? Authorizing Provider  albuterol (PROVENTIL HFA;VENTOLIN HFA) 108 (90 BASE) MCG/ACT inhaler Inhale 2 puffs into the lungs every 8 (eight) hours as needed for wheezing or shortness of breath.    [provider]  atorvastatin (LIPITOR) 20 MG tablet TK 1 T PO D 03/08/18   [provider]  clindamycin (CLEOCIN) 300 MG capsule Take 300 capsules every 6 (six) hours as needed by mouth. 01/13/17   [provider]  clopidogrel (PLAVIX) 75 MG tablet Take 1 tablet (75 mg total) by mouth daily. 11/03/16   Dennie Bible, NP  cycloSPORINE (RESTASIS) 0.05 % ophthalmic emulsion Place 1 drop into both eyes 2 (two) times daily. 10/26/15   Angiulli, Lavon Paganini, PA-C  esomeprazole (NEXIUM) 40 MG capsule Take 40 mg daily by mouth. 12/26/16   [provider]  famotidine (PEPCID) 20 MG tablet Take 1 tablet (20 mg total) by mouth 2 (two) times daily. 10/26/15   Angiulli, Lavon Paganini, PA-C  fluticasone (FLONASE) 50 MCG/ACT nasal spray Place 1 spray into the nose daily as needed for allergies.  06/04/15   [provider]  folic acid  (FOLVITE) 1 MG tablet TK 1 T PO D 08/28/18   [provider]  gabapentin (NEURONTIN) 300 MG capsule Take 1 capsule (300 mg total) by mouth 3 (three) times daily. Patient taking differently: Take 300 mg by mouth 3 (three) times daily. Filled 01/18/2017 10/26/15   Angiulli, Lavon Paganini, PA-C  hydrochlorothiazide (HYDRODIURIL) 25 MG tablet Take 25 mg by mouth daily. 09/20/18   [provider]  ibuprofen (ADVIL,MOTRIN) 800 MG tablet Take 800 mg as needed by mouth. 12/21/16   [provider]  lidocaine (LIDODERM) 5 % Place 1 patch onto the skin daily. Remove & Discard patch within 12 hours or as directed by MD 02/28/17   Tasia Catchings, Amy V, PA-C  lisinopril (PRINIVIL,ZESTRIL) 20 MG tablet Take 20 mg daily by mouth. 11/15/16   [provider]  naproxen (NAPROSYN) 500 MG tablet Take 500 mg 2 (two) times daily as needed by mouth. 11/28/16   [provider]  omeprazole (PRILOSEC) 40 MG capsule TK 1 C PO D 03/12/18   [provider]  oxybutynin (DITROPAN-XL) 5 MG 24 hr tablet TAKE 1 TABLET BY MOUTH AT BEDTIME. 11/06/15   Jamse Arn, MD  pantoprazole (PROTONIX) 40 MG tablet Take 1 tablet (40 mg total) by mouth 2 (two) times daily before a meal. 10/26/15   Angiulli, Lavon Paganini, PA-C  polyethylene glycol powder (GLYCOLAX/MIRALAX) powder Take 17 g 2 (two) times daily by mouth. 01/21/17   Muthersbaugh, Jarrett Soho, PA-C  ranitidine (ZANTAC) 150 MG tablet Take 1 tablet (150 mg total) 2 (two) times daily by mouth. 01/21/17   Muthersbaugh, Jarrett Soho, PA-C  rOPINIRole (REQUIP) 0.25 MG tablet Take 1 tablet (0.25 mg total) by mouth 3 (three) times daily. 12/02/15   Kirsteins, Luanna Salk, MD  senna-docusate (SENOKOT-S) 8.6-50 MG tablet Take 1 tablet by mouth 2 (two) times daily. 11/05/15   Jamse Arn, MD  sertraline (ZOLOFT) 50 MG tablet Take 1 tablet (50 mg total) by mouth daily. 10/26/15   Angiulli, Lavon Paganini, PA-C  temazepam (RESTORIL) 15 MG capsule Take 15 mg by mouth at bedtime.     [provider]  thiamine (VITAMIN B-1) 100 MG tablet TK 1 T PO D 08/28/18   [provider]  traMADol (ULTRAM) 50 MG tablet TK 1 T PO BID 03/21/18   [provider]  traMADol-acetaminophen (ULTRACET) 37.5-325 MG tablet Take 1 tablet by mouth every 4 (four) hours as needed for moderate pain. 02/02/16   Kirsteins, Luanna Salk, MD  Travoprost, BAK Free, (TRAVATAN) 0.004 % SOLN ophthalmic solution Place 1 drop into both eyes at bedtime.     [provider]  traZODone (DESYREL) 50 MG tablet Take 50 mg at bedtime by mouth. 01/18/17   [provider]  UNABLE TO FIND TK 1 T PO D 11/07/18   [provider]    Physical Exam: Vitals:   01/28/21 1845 01/28/21 1900 01/28/21 1915 01/28/21 1930  BP: (!) 97/56 (!) 99/53 (!) 101/58 (!) 104/58  Pulse: 66 65 66 65  Resp: 17 17 17 17   Temp:      TempSrc:      SpO2: 97% 100% 99% 99%    Constitutional: NAD, calm, comfortable, nontoxic appearing thin frail elderly female laying approximately 30 degree incline in bed Vitals:   01/28/21 1845 01/28/21 1900 01/28/21 1915 01/28/21 1930  BP: (!) 97/56 (!) 99/53 (!) 101/58 (!) 104/58  Pulse: 66 65 66 65  Resp: 17 17 17 17   Temp:      TempSrc:      SpO2: 97% 100% 99% 99%   Eyes: PERRL, lids and conjunctivae normal ENMT: Mucous membranes are moist.  Neck: normal, supple Respiratory: clear to auscultation bilaterally, no wheezing, no crackles. Normal respiratory effort. No accessory muscle use.  Cardiovascular: Regular rate and rhythm, no murmurs / rubs / gallops. No extremity edema.  Abdomen: no tenderness, no masses palpated.  Bowel sounds positive.  Musculoskeletal: no clubbing / cyanosis. No joint deformity upper and lower extremities.  Pain with palpation of the distal border of the lateral malleolus Skin: no rashes, lesions, ulcers. No induration Neurologic: CN 2-12 grossly intact. Sensation intact, Strength 4/5 in bilateral lower extremity  psychiatric:  Normal judgment and insight. Alert and oriented x 3. Normal mood.     Labs on Admission: I have personally reviewed following labs and imaging studies  CBC: Recent Labs  Lab 01/28/21 1617 01/28/21 1620 01/28/21 1622  WBC 8.7  --   --  NEUTROABS 6.6  --   --   HGB 13.1 15.3* 14.3  HCT 41.2 45.0 42.0  MCV 93.6  --   --   PLT 144*  --   --    Basic Metabolic Panel: Recent Labs  Lab 01/28/21 1617 01/28/21 1620 01/28/21 1622  NA 136 138 138  K 2.9* 4.4 4.4  CL 99 104  --   CO2 24  --   --   GLUCOSE 110* 99  --   BUN 18 25*  --   CREATININE 2.17* 2.20*  --   CALCIUM 9.4  --   --    GFR: CrCl cannot be calculated (Unknown ideal weight.). Liver Function Tests: Recent Labs  Lab 01/28/21 1617  AST 30  ALT 19  ALKPHOS 59  BILITOT 0.9  PROT 7.6  ALBUMIN 4.2   No results for input(s): LIPASE, AMYLASE in the last 168 hours. No results for input(s): AMMONIA in the last 168 hours. Coagulation Profile: Recent Labs  Lab 01/28/21 1617  INR 1.1   Cardiac Enzymes: No results for input(s): CKTOTAL, CKMB, CKMBINDEX, TROPONINI in the last 168 hours. BNP (last 3 results) No results for input(s): PROBNP in the last 8760 hours. HbA1C: No results for input(s): HGBA1C in the last 72 hours. CBG: Recent Labs  Lab 01/28/21 1818  GLUCAP 89   Lipid Profile: No results for input(s): CHOL, HDL, LDLCALC, TRIG, CHOLHDL, LDLDIRECT in the last 72 hours. Thyroid Function Tests: Recent Labs    01/28/21 1658  TSH 7.715*  FREET4 0.77   Anemia Panel: No results for input(s): VITAMINB12, FOLATE, FERRITIN, TIBC, IRON, RETICCTPCT in the last 72 hours. Urine analysis:    Component Value Date/Time   COLORURINE YELLOW 01/20/2017 1720   APPEARANCEUR CLEAR 01/20/2017 1720   APPEARANCEUR Clear 12/10/2015 1023   LABSPEC 1.010 01/20/2017 1720   PHURINE 6.0 01/20/2017 1720   GLUCOSEU NEGATIVE 01/20/2017 1720   HGBUR NEGATIVE 01/20/2017 1720   BILIRUBINUR NEGATIVE 01/20/2017 1720    BILIRUBINUR Negative 12/10/2015 1023   KETONESUR NEGATIVE 01/20/2017 1720   PROTEINUR NEGATIVE 01/20/2017 1720   UROBILINOGEN 0.2 05/22/2014 0451   NITRITE NEGATIVE 01/20/2017 1720   LEUKOCYTESUR NEGATIVE 01/20/2017 1720   LEUKOCYTESUR Negative 12/10/2015 1023    Radiological Exams on Admission: CT Head Wo Contrast  Result Date: 01/28/2021 CLINICAL DATA:  Syncope.  Delirium. EXAM: CT HEAD WITHOUT CONTRAST TECHNIQUE: Contiguous axial images were obtained from the base of the skull through the vertex without intravenous contrast. COMPARISON:  09/26/2015 FINDINGS: Brain: Periventricular low-density on the right could reflect lacunar infarct or chronic small vessel disease. This appears chronic. No acute intracranial abnormality. Specifically, no hemorrhage, hydrocephalus, mass lesion, acute infarction, or significant intracranial injury. Vascular: No hyperdense vessel or unexpected calcification. Skull: No acute calvarial abnormality. Sinuses/Orbits: Mucosal thickening in the left maxillary sinus and ethmoid air cells. No air-fluid levels. Other: None IMPRESSION: Chronic small vessel disease versus chronic right periventricular lacunar infarct. No acute intracranial abnormality. Chronic sinusitis. Electronically Signed   By: Rolm Baptise M.D.   On: 01/28/2021 18:23   CT Cervical Spine Wo Contrast  Result Date: 01/28/2021 CLINICAL DATA:  Syncope.  Neck trauma (Age >= 65y) EXAM: CT CERVICAL SPINE WITHOUT CONTRAST TECHNIQUE: Multidetector CT imaging of the cervical spine was performed without intravenous contrast. Multiplanar CT image reconstructions were also generated. COMPARISON:  None. FINDINGS: Alignment: Normal. Skull base and vertebrae: No acute fracture. No primary bone lesion or focal pathologic process. Soft tissues and spinal canal: No prevertebral  fluid or swelling. No visible canal hematoma. Disc levels: Slight disc space narrowing and early spurring at C5-6. Upper chest: No acute findings  Other: None IMPRESSION: No acute bony abnormality. Electronically Signed   By: Rolm Baptise M.D.   On: 01/28/2021 18:24   DG Chest Port 1 View  Result Date: 01/28/2021 CLINICAL DATA:  Questionable sepsis EXAM: PORTABLE CHEST 1 VIEW COMPARISON:  12/12/2015 FINDINGS: Heart and mediastinal contours are within normal limits. No focal opacities or effusions. No acute bony abnormality. IMPRESSION: No active disease. Electronically Signed   By: Rolm Baptise M.D.   On: 01/28/2021 18:42      Assessment/Plan  Syncope secondary to hypotension from dehydration with influenza A -Initial blood pressure systolic of [id number].  Has received aggressive 2 L IV fluid bolus.  Continuous IV 75 cc/h fluid overnight -Continue to monitor on telemetry -Hold home antihypertensives-HCTZ, lisinopril  Hypokalemia Replete with IV K 10 mEq x 2 Check magnesium and replete as needed  AKI Creatinine of 2.17 Follow repeat creatinine tomorrow after continuous IV fluid hydration. Avoid nephrotoxic agent pretension and renally dose medications  Prolonged QT Qtc of 503. Avoid QT prolongation medication.  Repeat EKG in the morning following repletion of electrolyte  Influenza A - Continue oseltamivir  Reactive hypothyroidism TSH of 7.715 with normal free T4 of 0.77.  Likely reactive to her influenza illness.  Recommends repeat TSH outpatient in 4 to 6 weeks.  Right ankle pain s/p fall -Obtain right foot x-ray  History of CVA with left hemiparesis Patient normally ambulates with a walker Continue Plavix  Asthma not in acute exacerbation  DVT prophylaxis:.Subcu heparin Code Status: Full Family Communication: Plan discussed with patient and daughter at bedside  disposition Plan: Home with at least 2 midnight stays  Consults called:  Admission status: inpatient  Level of care: Progressive  Status is: Inpatient  Remains inpatient appropriate because: Severe hypotension causing syncope requiring aggressive IV  fluid resuscitation         Orene Desanctis DO [facility]   If 7PM-7AM, please contact night-coverage [email or web address]   01/28/2021, 8:29 PM

## 2021-01-28 NOTE — ED Triage Notes (Signed)
Pt bib ems from home after syncopal event. Unsure if pt hit head, currently taking plavix. Pt alert X2 with ems. Family told ems that she has been exposed to the flu and not feeling well since yesterday. + emesis with ems. Pt with agonal event on arrival. EDP to bedside.

## 2021-01-29 ENCOUNTER — Inpatient Hospital Stay (HOSPITAL_COMMUNITY): Payer: Medicare Other

## 2021-01-29 ENCOUNTER — Encounter (HOSPITAL_COMMUNITY): Payer: Self-pay | Admitting: Family Medicine

## 2021-01-29 DIAGNOSIS — R55 Syncope and collapse: Secondary | ICD-10-CM | POA: Diagnosis not present

## 2021-01-29 DIAGNOSIS — N179 Acute kidney failure, unspecified: Secondary | ICD-10-CM | POA: Diagnosis not present

## 2021-01-29 DIAGNOSIS — E860 Dehydration: Secondary | ICD-10-CM | POA: Diagnosis not present

## 2021-01-29 DIAGNOSIS — J101 Influenza due to other identified influenza virus with other respiratory manifestations: Secondary | ICD-10-CM | POA: Diagnosis not present

## 2021-01-29 DIAGNOSIS — E876 Hypokalemia: Secondary | ICD-10-CM | POA: Diagnosis not present

## 2021-01-29 DIAGNOSIS — I9589 Other hypotension: Secondary | ICD-10-CM | POA: Diagnosis not present

## 2021-01-29 LAB — CBC
HCT: 32.6 % — ABNORMAL LOW (ref 36.0–46.0)
Hemoglobin: 10.5 g/dL — ABNORMAL LOW (ref 12.0–15.0)
MCH: 29.9 pg (ref 26.0–34.0)
MCHC: 32.2 g/dL (ref 30.0–36.0)
MCV: 92.9 fL (ref 80.0–100.0)
Platelets: 121 10*3/uL — ABNORMAL LOW (ref 150–400)
RBC: 3.51 MIL/uL — ABNORMAL LOW (ref 3.87–5.11)
RDW: 13.2 % (ref 11.5–15.5)
WBC: 6.3 10*3/uL (ref 4.0–10.5)
nRBC: 0 % (ref 0.0–0.2)

## 2021-01-29 LAB — CBG MONITORING, ED: Glucose-Capillary: 73 mg/dL (ref 70–99)

## 2021-01-29 LAB — BASIC METABOLIC PANEL
Anion gap: 6 (ref 5–15)
BUN: 13 mg/dL (ref 8–23)
CO2: 23 mmol/L (ref 22–32)
Calcium: 8.6 mg/dL — ABNORMAL LOW (ref 8.9–10.3)
Chloride: 111 mmol/L (ref 98–111)
Creatinine, Ser: 1.07 mg/dL — ABNORMAL HIGH (ref 0.44–1.00)
GFR, Estimated: 57 mL/min — ABNORMAL LOW (ref >60)
Glucose, Bld: 76 mg/dL (ref 70–99)
Potassium: 3 mmol/L — ABNORMAL LOW (ref 3.5–5.1)
Sodium: 140 mmol/L (ref 135–145)

## 2021-01-29 LAB — ECHOCARDIOGRAM COMPLETE
Area-P 1/2: 3.51 cm2
Height: 63 in
S' Lateral: 2.3 cm
Weight: 2192 oz

## 2021-01-29 LAB — MRSA NEXT GEN BY PCR, NASAL: MRSA by PCR Next Gen: NOT DETECTED

## 2021-01-29 MED ORDER — ACETAMINOPHEN 325 MG PO TABS: 650.0000 mg | ORAL_TABLET | Freq: Four times a day (QID) | ORAL | Status: DC | PRN

## 2021-01-29 MED ORDER — TRAZODONE HCL 50 MG PO TABS
100.0000 mg | ORAL_TABLET | Freq: Every day | ORAL | Status: DC
  Administered 2021-01-29 – 2021-01-30 (×2): 100 mg via ORAL
  Filled 2021-01-29 (×2): qty 2

## 2021-01-29 MED ORDER — IBUPROFEN 200 MG PO TABS: 800.0000 mg | ORAL_TABLET | Freq: Three times a day (TID) | ORAL | Status: DC | PRN

## 2021-01-29 MED ORDER — TRAMADOL HCL 50 MG PO TABS
50.0000 mg | ORAL_TABLET | Freq: Every day | ORAL | Status: DC
  Administered 2021-01-29: 50 mg via ORAL
  Filled 2021-01-29: qty 1

## 2021-01-29 MED ORDER — LATANOPROST 0.005 % OP SOLN
1.0000 [drp] | Freq: Every day | OPHTHALMIC | Status: DC
  Administered 2021-01-29 – 2021-01-30 (×2): 1 [drp] via OPHTHALMIC
  Filled 2021-01-29: qty 2.5

## 2021-01-29 MED ORDER — ZOLPIDEM TARTRATE 5 MG PO TABS
5.0000 mg | ORAL_TABLET | Freq: Every day | ORAL | Status: DC
  Administered 2021-01-29 – 2021-01-30 (×3): 5 mg via ORAL
  Filled 2021-01-29 (×3): qty 1

## 2021-01-29 MED ORDER — ZOLPIDEM TARTRATE 5 MG PO TABS: 5.0000 mg | ORAL_TABLET | Freq: Every evening | ORAL | Status: DC | PRN

## 2021-01-29 MED ORDER — ROPINIROLE HCL 0.25 MG PO TABS
0.2500 mg | ORAL_TABLET | Freq: Three times a day (TID) | ORAL | Status: DC
  Administered 2021-01-29 – 2021-01-31 (×7): 0.25 mg via ORAL
  Filled 2021-01-29 (×9): qty 1

## 2021-01-29 MED ORDER — SERTRALINE HCL 25 MG PO TABS
50.0000 mg | ORAL_TABLET | Freq: Every day | ORAL | Status: DC
  Administered 2021-01-29 – 2021-01-30 (×2): 50 mg via ORAL
  Filled 2021-01-29 (×2): qty 2

## 2021-01-29 MED ORDER — POTASSIUM CHLORIDE CRYS ER 20 MEQ PO TBCR
40.0000 meq | EXTENDED_RELEASE_TABLET | ORAL | Status: AC
  Administered 2021-01-29 (×2): 40 meq via ORAL
  Filled 2021-01-29 (×2): qty 2

## 2021-01-29 MED ORDER — OSELTAMIVIR PHOSPHATE 30 MG PO CAPS
30.0000 mg | ORAL_CAPSULE | Freq: Two times a day (BID) | ORAL | Status: DC
  Administered 2021-01-29 – 2021-01-31 (×4): 30 mg via ORAL
  Filled 2021-01-29 (×5): qty 1

## 2021-01-29 MED ORDER — TRAMADOL HCL 50 MG PO TABS
50.0000 mg | ORAL_TABLET | Freq: Four times a day (QID) | ORAL | Status: DC | PRN
  Administered 2021-01-29 – 2021-01-31 (×2): 50 mg via ORAL
  Filled 2021-01-29 (×3): qty 1

## 2021-01-29 MED ORDER — GABAPENTIN 300 MG PO CAPS
300.0000 mg | ORAL_CAPSULE | Freq: Three times a day (TID) | ORAL | Status: DC
  Administered 2021-01-29 – 2021-01-31 (×7): 300 mg via ORAL
  Filled 2021-01-29 (×7): qty 1

## 2021-01-29 MED ORDER — ATORVASTATIN CALCIUM 10 MG PO TABS
20.0000 mg | ORAL_TABLET | Freq: Every day | ORAL | Status: DC
  Administered 2021-01-29 – 2021-01-31 (×3): 20 mg via ORAL
  Filled 2021-01-29 (×3): qty 2

## 2021-01-29 MED ORDER — LACTATED RINGERS IV SOLN: INTRAVENOUS | Status: AC

## 2021-01-29 MED ORDER — FLUTICASONE PROPIONATE 50 MCG/ACT NA SUSP
1.0000 | Freq: Every day | NASAL | Status: DC | PRN
  Filled 2021-01-29: qty 16

## 2021-01-29 MED ORDER — ACETAMINOPHEN 325 MG PO TABS
650.0000 mg | ORAL_TABLET | Freq: Four times a day (QID) | ORAL | Status: DC | PRN
  Administered 2021-01-29: 650 mg via ORAL
  Filled 2021-01-29: qty 2

## 2021-01-29 MED ORDER — CLOPIDOGREL BISULFATE 75 MG PO TABS
75.0000 mg | ORAL_TABLET | Freq: Every day | ORAL | Status: DC
  Administered 2021-01-29 – 2021-01-31 (×3): 75 mg via ORAL
  Filled 2021-01-29 (×3): qty 1

## 2021-01-29 MED ORDER — THIAMINE HCL 100 MG PO TABS
100.0000 mg | ORAL_TABLET | Freq: Every day | ORAL | Status: DC
  Administered 2021-01-29 – 2021-01-31 (×3): 100 mg via ORAL
  Filled 2021-01-29 (×3): qty 1

## 2021-01-29 MED ORDER — TIMOLOL MALEATE 0.5 % OP SOLN
1.0000 [drp] | Freq: Every morning | OPHTHALMIC | Status: DC
  Administered 2021-01-29 – 2021-01-31 (×3): 1 [drp] via OPHTHALMIC
  Filled 2021-01-29: qty 5

## 2021-01-29 NOTE — Progress Notes (Addendum)
PROGRESS NOTE   [name]  RWE:[id number]    DOB: Aug 06, 1955    DOA: 01/28/2021  PCP: Rometta Emery, MD   I have briefly reviewed patients previous medical records in [facility].  Chief Complaint  Patient presents with   Near Syncope    Brief Narrative:  65 year old female, lives with her adult niece, medical history significant for CVA with residual left hemiparesis, ambulates with the help of a walker or cane, hypertension, asthma, GI bleed, PTSD, ongoing tobacco use disorder, who presented to the ED following a syncopal episode.  She also had 3 days history of feeling unwell, cough, runny nose, diarrhea, nausea, vomiting and decreased appetite.  On EMS arrival, SBP's reportedly in the 70s, reportedly became less responsive in the ambulance requiring bag-valve-mask.  In ED, afebrile, hypotensive with SBP in the 80s.  Admitted for syncope likely secondary to hypotension from dehydration related to influenza A acute bronchitis.   Assessment & Plan:  Principal Problem:   Hypotension Active Problems:   Asthma   History of stroke   Hypokalemia   AKI (acute kidney injury) (HCC)   Prolonged QT interval   Influenza A   Syncope and collapse, secondary to hypotension from dehydration - SBP's in the 70s by EMS and 80s in the ED - Resuscitated with aggressive IV fluids and BP has improved.  Continue IV fluids. - Telemetry shows sinus rhythm.  Continue telemetry x24 hours. - CT head and neck without acute findings. - Given history of HTN, will check 2D echo to ensure EF is preserved and no other abnormalities. - Holding home antihypertensives. - PT evaluation. - Prior to discharge, patient must be counseled that she should not drive x6 months.  Influenza A acute bronchitis - Her respiratory and GI symptoms likely related to this.  Likely contracted this from her 80-year-old nephew who had it and was visiting her. - Continue Tamiflu and supportive care.  Currently not  hypoxic. - Chest x-ray without acute findings.  Dehydration and hypotension - Secondary to GI losses and poor oral intake - Improved after IV fluids, continue for additional 24 hours  Acute kidney injury - Due to dehydration, HCTZ and lisinopril - Presented with creatinine of 2.17.  Resolved after IV fluids and creatinine down to 1.07.  Hypokalemia - Replace aggressively and follow BMP in AM.  Magnesium 1.8.  Acute anemia - Baseline hemoglobin probably in the 13 g range.  Presented with hemoglobin of 15.3 likely related to dehydration which is now dropped to 10.5 post hydration.  No acute bleeding. Follow CBC in AM.  Thrombocytopenia - Appears chronic.  Follow CBC.  Essential hypertension - Continue to hold HCTZ and lisinopril for now and consider resuming at discharge.  Abnormal TSH - TSH 7.715 but free T4 0.77.  Likely sick euthyroid. - Recommend repeating in 4 to 6 weeks.  Right ankle pain s/p fall - Right foot x-ray without acute abnormality.  Supportive care.  History of CVA with residual left hemiparesis - PT and OT evaluation. - Continue Plavix  Asthma - Not in acute exacerbation.  Prolonged QTC - EKG on 11/17 with QTC of 503 ms. - Replace potassium.  Magnesium okay. - Continue telemetry monitoring.  Follow EKG in AM.  Avoid QT prolonging meds.  Body mass index is 24.27 kg/m.    DVT prophylaxis: heparin injection 5,000 Units Start: 01/28/21 2200     Code Status: Full Code Family Communication: None at bedside Disposition:  Status is: Inpatient  Remains  inpatient appropriate because: Ongoing nausea, poor oral intake, diarrhea, presentation with syncope related to hypotension, ongoing IV fluids and electrolyte replacement.        Consultants:   None  Procedures:     Antimicrobials:      Subjective:  Seen this morning in ED.  Reports that she lives with her 49 year old niece who takes care of her.  Ambulates with the help of a cane or a  walker.  Has residual left-sided weakness from prior stroke.  Her 91-year-old nephew recently visited her and he had the flu and she feels like she contracted it from him.  Reports cough, mostly nonproductive.  Denies dyspnea.  Has intermittent nausea, vomiting, poor oral intake.  Multiple loose stools up to this morning.  No abdominal pain.  Objective:   Vitals:   01/29/21 0900 01/29/21 0909 01/29/21 1100 01/29/21 1315  BP: 109/71  (!) 120/57 101/62  Pulse: 62 61 60 (!) 58  Resp: Temp:  98.5 F (36.9 C)  98.2 F (36.8 C)  TempSrc:  Oral  Oral  SpO2: 98% 97% 97% 99%  Weight:      Height:        General exam: Middle-age female, looks older than stated age, moderately built and frail, somewhat ill looking lying propped up in bed.  Has emesis bag at bedside.  Oral mucosa dry. Respiratory system: Breath sounds somewhat harsh.  No wheezing, rhonchi or crackles heard.  No increased work of breathing.  On 2 L/min nasal cannula oxygen but saturating in the high 90s to 100. Cardiovascular system: S1 & S2 heard, RRR. No JVD, murmurs, rubs, gallops or clicks. No pedal edema.  Telemetry personally reviewed: Sinus rhythm. Gastrointestinal system: Abdomen is nondistended, soft and nontender. No organomegaly or masses felt. Normal bowel sounds heard. Central nervous system: Alert and oriented. No focal neurological deficits. Extremities: Symmetric 5 x 5 power. Skin: No rashes, lesions or ulcers Psychiatry: Judgement and insight appear somewhat impaired. Mood & affect appropriate.     Data Reviewed:   I have personally reviewed following labs and imaging studies   CBC: Recent Labs  Lab 01/28/21 1617 01/28/21 1620 01/28/21 1622 01/29/21 0615  WBC 8.7  --   --  6.3  NEUTROABS 6.6  --   --   --   HGB 13.1 15.3* 14.3 10.5*  HCT 41.2 45.0 42.0 32.6*  MCV 93.6  --   --  92.9  PLT 144*  --   --  121*    Basic Metabolic Panel: Recent Labs  Lab 01/28/21 1617 01/28/21 1620  01/28/21 1622 01/28/21 2204 01/29/21 0615  NA 136 138 138  --  140  K 2.9* 4.4 4.4  --  3.0*  CL 99 104  --   --  111  CO2 24  --   --   --  23  GLUCOSE 110* 99  --   --  76  BUN 18 25*  --   --  13  CREATININE 2.17* 2.20*  --   --  1.07*  CALCIUM 9.4  --   --   --  8.6*  MG  --   --   --  1.8  --     Liver Function Tests: Recent Labs  Lab 01/28/21 1617  AST 30  ALT 19  ALKPHOS 59  BILITOT 0.9  PROT 7.6  ALBUMIN 4.2    CBG: Recent Labs  Lab 01/28/21 1818 01/29/21 0608  GLUCAP  89 73    Microbiology Studies:   Recent Results (from the past 240 hour(s))  Resp Panel by RT-PCR (Flu A&B, Covid) Nasopharyngeal Swab     Status: Abnormal   Collection Time: 01/28/21  4:20 PM   Specimen: Nasopharyngeal Swab; Nasopharyngeal(NP) swabs in vial transport medium  Result Value Ref Range Status   SARS Coronavirus 2 by RT PCR NEGATIVE NEGATIVE Final    Comment: (NOTE) SARS-CoV-2 target nucleic acids are NOT DETECTED.  The SARS-CoV-2 RNA is generally detectable in upper respiratory specimens during the acute phase of infection. The lowest concentration of SARS-CoV-2 viral copies this assay can detect is 138 copies/mL. A negative result does not preclude SARS-Cov-2 infection and should not be used as the sole basis for treatment or other patient management decisions. A negative result may occur with  improper specimen collection/handling, submission of specimen other than nasopharyngeal swab, presence of viral mutation(s) within the areas targeted by this assay, and inadequate number of viral copies(<138 copies/mL). A negative result must be combined with clinical observations, patient history, and epidemiological information. The expected result is Negative.  Fact Sheet for Patients:  BloggerCourse.com  Fact Sheet for Healthcare Providers:  SeriousBroker.it  This test is no t yet approved or cleared by the [place] FDA  and  has been authorized for detection and/or diagnosis of SARS-CoV-2 by FDA under an Emergency Use Authorization (EUA). This EUA will remain  in effect (meaning this test can be used) for the duration of the COVID-19 declaration under Section 564(b)(1) of the Act, 21 U.S.C.section 360bbb-3(b)(1), unless the authorization is terminated  or revoked sooner.       Influenza A by PCR POSITIVE (A) NEGATIVE Final   Influenza B by PCR NEGATIVE NEGATIVE Final    Comment: (NOTE) The Xpert Xpress SARS-CoV-2/FLU/RSV plus assay is intended as an aid in the diagnosis of influenza from Nasopharyngeal swab specimens and should not be used as a sole basis for treatment. Nasal washings and aspirates are unacceptable for Xpert Xpress SARS-CoV-2/FLU/RSV testing.  Fact Sheet for Patients: BloggerCourse.com  Fact Sheet for Healthcare Providers: SeriousBroker.it  This test is not yet approved or cleared by the [place] FDA and has been authorized for detection and/or diagnosis of SARS-CoV-2 by FDA under an Emergency Use Authorization (EUA). This EUA will remain in effect (meaning this test can be used) for the duration of the COVID-19 declaration under Section 564(b)(1) of the Act, 21 U.S.C. section 360bbb-3(b)(1), unless the authorization is terminated or revoked.  Performed at [facility], [address], [place], [place] [address]   Blood Culture (routine x 2)     Status: None (Preliminary result)   Collection Time: 01/28/21  4:42 PM   Specimen: BLOOD  Result Value Ref Range Status   Specimen Description BLOOD LEFT ANTECUBITAL  Final   Special Requests   Final    BOTTLES DRAWN AEROBIC AND ANAEROBIC Blood Culture adequate volume   Culture   Final    NO GROWTH < 24 HOURS Performed at [facility], [address], [place], [place] [address]    Report Status PENDING  Incomplete  Blood Culture (routine x 2)     Status: None  (Preliminary result)   Collection Time: 01/28/21 10:04 PM   Specimen: BLOOD  Result Value Ref Range Status   Specimen Description BLOOD SITE NOT SPECIFIED  Final   Special Requests   Final    BOTTLES DRAWN AEROBIC AND ANAEROBIC Blood Culture results may not  be optimal due to an inadequate volume of blood received in culture bottles   Culture   Final    NO GROWTH < 24 HOURS Performed at [facility], [address], [place], [place] [address]    Report Status PENDING  Incomplete    Radiology Studies:  CT Head Wo Contrast  Result Date: 01/28/2021 CLINICAL DATA:  Syncope.  Delirium. EXAM: CT HEAD WITHOUT CONTRAST TECHNIQUE: Contiguous axial images were obtained from the base of the skull through the vertex without intravenous contrast. COMPARISON:  09/26/2015 FINDINGS: Brain: Periventricular low-density on the right could reflect lacunar infarct or chronic small vessel disease. This appears chronic. No acute intracranial abnormality. Specifically, no hemorrhage, hydrocephalus, mass lesion, acute infarction, or significant intracranial injury. Vascular: No hyperdense vessel or unexpected calcification. Skull: No acute calvarial abnormality. Sinuses/Orbits: Mucosal thickening in the left maxillary sinus and ethmoid air cells. No air-fluid levels. Other: None IMPRESSION: Chronic small vessel disease versus chronic right periventricular lacunar infarct. No acute intracranial abnormality. Chronic sinusitis. Electronically Signed   By: Charlett Nose M.D.   On: 01/28/2021 18:23   CT Cervical Spine Wo Contrast  Result Date: 01/28/2021 CLINICAL DATA:  Syncope.  Neck trauma (Age >= 65y) EXAM: CT CERVICAL SPINE WITHOUT CONTRAST TECHNIQUE: Multidetector CT imaging of the cervical spine was performed without intravenous contrast. Multiplanar CT image reconstructions were also generated. COMPARISON:  None. FINDINGS: Alignment: Normal. Skull base and vertebrae: No acute fracture. No primary bone lesion or  focal pathologic process. Soft tissues and spinal canal: No prevertebral fluid or swelling. No visible canal hematoma. Disc levels: Slight disc space narrowing and early spurring at C5-6. Upper chest: No acute findings Other: None IMPRESSION: No acute bony abnormality. Electronically Signed   By: Charlett Nose M.D.   On: 01/28/2021 18:24   DG Chest Port 1 View  Result Date: 01/28/2021 CLINICAL DATA:  Questionable sepsis EXAM: PORTABLE CHEST 1 VIEW COMPARISON:  12/12/2015 FINDINGS: Heart and mediastinal contours are within normal limits. No focal opacities or effusions. No acute bony abnormality. IMPRESSION: No active disease. Electronically Signed   By: Charlett Nose M.D.   On: 01/28/2021 18:42   DG Foot 2 Views Right  Result Date: 01/28/2021 CLINICAL DATA:  Right foot pain. EXAM: RIGHT FOOT - 2 VIEW COMPARISON:  Right foot radiograph dated 03/06/2014. FINDINGS: There is no acute fracture or dislocation. The bones are osteopenic. No significant arthritic changes. The soft tissues are grossly unremarkable. IMPRESSION: No acute fracture or dislocation. Electronically Signed   By: Elgie Collard M.D.   On: 01/28/2021 20:49    Scheduled Meds:    atorvastatin  20 mg Oral Daily   clopidogrel  75 mg Oral Daily   gabapentin  300 mg Oral TID   heparin  5,000 Units Subcutaneous Q8H   latanoprost  1 drop Both Eyes QHS   oseltamivir  75 mg Oral BID   rOPINIRole  0.25 mg Oral Q8H   sertraline  50 mg Oral QHS   sodium chloride flush  3 mL Intravenous Q12H   thiamine  100 mg Oral Daily   timolol  1 drop Both Eyes q morning   traZODone  100 mg Oral QHS   zolpidem  5 mg Oral QHS    Continuous Infusions:     LOS: 1 day     Marcellus Scott, MD,  FACP, [facility], [facility], [facility] (Care Management Physician Certified) [facility] [place]  To contact the attending provider between 7A-7P or  the covering provider during after hours 7P-7A, please log into the web site [email or web address]  and access using universal [place] password for that web site. If you do not have the password, please call the hospital operator.  01/29/2021, 2:25 PM

## 2021-01-29 NOTE — Progress Notes (Signed)
Started crying claiming  she has left leg pain scale 10/10 Repositioned for comfort. Ultram po given. Continue to monitor.

## 2021-01-29 NOTE — Evaluation (Signed)
Physical Therapy Evaluation Patient Details Name: [name] MRN: [id number] DOB: 1955-12-11 Today's Date: 01/29/2021  History of Present Illness  Pt is a 65 y.o. female who presented 01/28/21 s/p fall with syncopal episode likely secondary to hypotension from dehydration related to influenza A acute bronchitis. All imaging negative for acute intracranial processes and fxs as of 11/18. PMH: CVA with residual left hemiparesis, hypertension, asthma, GI bleed, PTSD, ongoing tobacco use disorder, seizures   Clinical Impression  Pt presents with condition above and deficits mentioned below, see PT Problem List. PTA, she was mod I using a rollator, intermittently a quad-cane, for mobility and her power w/c for community mobility. Pt requires supervision for safety on stairs though, but denies any recent falls other than this syncope episode. Pt with hx of L-sided weakness, noted upon assessment this session, due to prior CVA. Pt with deficits in balance, weight-bearing tolerance on her L leg due to pain, and activity tolerance primarily due to pain this date. Pt was able to perform all functional mobility using a RW at a min guard assist level without LOB today. Pt is primarily limited by pain, but otherwise is functioning close to her baseline. I expect pt will quickly return to her baseline as her pain improves. Thus, no follow-up PT necessary at d/c. Will continue to follow acutely to maximize pt's return to baseline prior to d/c.   BP:  117/61 supine 111/62 sitting (lightheaded) 98/67 standing 115/60 standing ~3 minutes     Recommendations for follow up therapy are one component of a multi-disciplinary discharge planning process, led by the attending physician.  Recommendations may be updated based on patient status, additional functional criteria and insurance authorization.  Follow Up Recommendations No PT follow up    Assistance Recommended at Discharge Intermittent Supervision/Assistance   Functional Status Assessment Patient has had a recent decline in their functional status and demonstrates the ability to make significant improvements in function in a reasonable and predictable amount of time.  Equipment Recommendations  None recommended by PT    Recommendations for Other Services       Precautions / Restrictions Precautions Precautions: Fall;Other (comment) Precaution Comments: monitor BP Restrictions Weight Bearing Restrictions: No      Mobility  Bed Mobility Overal bed mobility: Needs Assistance Bed Mobility: Supine to Sit     Supine to sit: Min guard;HOB elevated     General bed mobility comments: Extra time and cues to bring legs off L EOB, HOB elevated, min guard for safety.    Transfers Overall transfer level: Needs assistance Equipment used: Rolling walker (2 wheels) Transfers: Sit to/from Stand Sit to Stand: Min guard           General transfer comment: Extra time, but pt able to power up to stand 2x from EOB to RW with min gaurd assist, no LOB. Cues provided for hand placement prior to transfers sit <> stand as pt tends to keep hands on RW.    Ambulation/Gait Ambulation/Gait assistance: Min guard Gait Distance (Feet): 10 Feet Assistive device: Rolling walker (2 wheels) Gait Pattern/deviations: Step-to pattern;Decreased stance time - left;Decreased step length - right;Decreased stride length;Decreased dorsiflexion - left;Decreased weight shift to left;Trunk flexed;Antalgic Gait velocity: reduced Gait velocity interpretation: <1.31 ft/sec, indicative of household ambulator   General Gait Details: Pt with slow, antalgic gait pattern with pt reporting pain with weight bearing on L. Step-to pattern with R foot due to poor L weight shift and stance time. No LOB, min guard for safety.  Stairs            Wheelchair Mobility    Modified Rankin (Stroke Patients Only) Modified Rankin (Stroke Patients Only) Pre-Morbid Rankin Score:  Slight disability Modified Rankin: Moderately severe disability     Balance Overall balance assessment: Needs assistance Sitting-balance support: No upper extremity supported;Feet supported Sitting balance-Leahy Scale: Good     Standing balance support: Reliant on assistive device for balance Standing balance-Leahy Scale: Poor Standing balance comment: Reliant on RW.                             Pertinent Vitals/Pain Pain Assessment: Faces Faces Pain Scale: Hurts little more Pain Location: L leg/ankle/foot, R knee Pain Descriptors / Indicators: Discomfort;Grimacing;Guarding Pain Intervention(s): Limited activity within patient's tolerance;Monitored during session;Premedicated before session;Repositioned    Home Living Family/patient expects to be discharged to:: Private residence Living Arrangements: Other relatives Available Help at Discharge: Family;Available 24 hours/day Type of Home: House Home Access: Stairs to enter Entrance Stairs-Rails: None Entrance Stairs-Number of Steps: 1 Alternate Level Stairs-Number of Steps: 14 Home Layout: Two level;Able to live on main level with bedroom/bathroom;1/2 bath on main level (does not go upstairs without assistance, laundry room and shower is upstairs) Home Equipment: Rollator (4 wheels);Cane - quad;Shower seat;Wheelchair - power;BSC/3in1      Prior Function Prior Level of Function : Needs assist       Physical Assist : Mobility (physical) Mobility (physical): Stairs   Mobility Comments: Supervision for stairs, but mod I using rollator primarily but intermittently uses quad-cane. Denies any recent falls other than this syncope episode. ADLs Comments: Mod I unless pt asks for help if she is feeling weaker than normal. Family normally checks in often, especially in showers.     Hand Dominance   Dominant Hand: Left    Extremity/Trunk Assessment   Upper Extremity Assessment Upper Extremity Assessment: LUE  deficits/detail LUE Deficits / Details: Weakness noted grossly compared to R, hx of CVA affecting L side, MMT scores of 4+ shoulder flexion, 4 elbow flexion, 4 elbow extension on L, grossly 4+ to 5 on R; denies numbness/tingling currently LUE Coordination: decreased gross motor    Lower Extremity Assessment Lower Extremity Assessment: LLE deficits/detail (bruising/scabs at R knee following fall) LLE Deficits / Details: Weakness noted grossly compared to R, hx of CVA affecting L side, MMT scores of 4 hip flexion, 4+ knee extension, 3+ ankle dorsiflexion, grossly 4+ to 5 on R; denies numbness/tingling currently LLE Coordination: decreased gross motor    Cervical / Trunk Assessment Cervical / Trunk Assessment: Normal  Communication   Communication: No difficulties  Cognition Arousal/Alertness: Awake/alert Behavior During Therapy: WFL for tasks assessed/performed Overall Cognitive Status: Within Functional Limits for tasks assessed                                          General Comments General comments (skin integrity, edema, etc.): Educated pt on performing seated leg exercises prior to standing and waiting with each transition to ensure less lightheadedness and risk of syncope episode; HR 59-70s, SpO2 >/= 95% on RA; BP 117/61 supine, 111/62 sitting (lightheaded), 98/67 standing, 115/60 standing ~3 minutes    Exercises     Assessment/Plan    PT Assessment Patient needs continued PT services  PT Problem List Decreased strength;Decreased activity tolerance;Decreased balance;Decreased mobility;Decreased coordination;Cardiopulmonary status limiting activity;Pain  PT Treatment Interventions DME instruction;Gait training;Stair training;Functional mobility training;Balance training;Therapeutic exercise;Therapeutic activities;Neuromuscular re-education;Patient/family education    PT Goals (Current goals can be found in the Care Plan section)  Acute Rehab PT  Goals Patient Stated Goal: to feel better and go home PT Goal Formulation: With patient/family Time For Goal Achievement: 02/12/21 Potential to Achieve Goals: Good    Frequency Min 3X/week   Barriers to discharge        Co-evaluation               AM-PAC PT "6 Clicks" Mobility  Outcome Measure Help needed turning from your back to your side while in a flat bed without using bedrails?: A Little Help needed moving from lying on your back to sitting on the side of a flat bed without using bedrails?: A Little Help needed moving to and from a bed to a chair (including a wheelchair)?: A Little Help needed standing up from a chair using your arms (e.g., wheelchair or bedside chair)?: A Little Help needed to walk in hospital room?: A Little Help needed climbing 3-5 steps with a railing? : A Lot 6 Click Score: 17    End of Session   Activity Tolerance: Patient tolerated treatment well;Patient limited by pain Patient left: in chair;with call bell/phone within reach;with chair alarm set Nurse Communication: Mobility status;Other (comment) (vitals) PT Visit Diagnosis: Unsteadiness on feet (R26.81);Other abnormalities of gait and mobility (R26.89);Muscle weakness (generalized) (M62.81);History of falling (Z91.81);Difficulty in walking, not elsewhere classified (R26.2);Other symptoms and signs involving the nervous system (R29.898);Pain Pain - Right/Left: Left Pain - part of body: Leg    Time: 1062-6948 PT Time Calculation (min) (ACUTE ONLY): 33 min   Charges:   PT Evaluation $PT Eval Low Complexity: 1 Low PT Treatments $Therapeutic Activity: 8-22 mins        Raymond Gurney, PT, DPT Acute Rehabilitation Services  Pager: [phone] Office: [phone]   Jewel Baize 01/29/2021, 4:07 PM

## 2021-01-29 NOTE — Progress Notes (Signed)
  Echocardiogram 2D Echocardiogram has been performed.  Delcie Roch 01/29/2021, 4:49 PM

## 2021-01-29 NOTE — ED Notes (Signed)
ED TO INPATIENT HANDOFF REPORT  ED Nurse Name and Phone #: Baxter Flattery, RN, 5  S Name/Age/Gender [name] 65 y.o. female Room/Bed: 029C/029C  Code Status   Code Status: Full Code  Home/SNF/Other Home Patient oriented to: self, place, time, and situation Is this baseline? Yes   Triage Complete: Triage complete  Chief Complaint Hypotension [I95.9]  Triage Note Pt bib ems from home after syncopal event. Unsure if pt hit head, currently taking plavix. Pt alert X2 with ems. Family told ems that she has been exposed to the flu and not feeling well since yesterday. + emesis with ems. Pt with agonal event on arrival. EDP to bedside.    Allergies Allergies  Allergen Reactions   Banana Other (See Comments)    Welts on skin and sores in mouth   Penicillins Other (See Comments)    Blister Has patient had a PCN reaction causing immediate rash, facial/tongue/throat swelling, SOB or lightheadedness with hypotension: no Has patient had a PCN reaction causing severe rash involving mucus membranes or skin necrosis: yes - blisters Has patient had a PCN reaction that required hospitalization no Has patient had a PCN reaction occurring within the last 10 years: no If all of the above answers are "NO", then may proceed with Cephalosporin use.    Simbrinza [Brinzolamide-Brimonidine] Swelling    Eyes swell shut    Level of Care/Admitting Diagnosis ED Disposition     ED Disposition  Admit   Condition  --   [place]: [place] [[id number]]  Level of Care: Progressive [102]  Admit to Progressive based on following criteria: CARDIOVASCULAR & THORACIC of moderate stability with acute coronary syndrome symptoms/low risk myocardial infarction/hypertensive urgency/arrhythmias/heart failure potentially compromising stability and stable post cardiovascular intervention patients.  May admit patient to [facility] or [facility] if equivalent level of care is available::  No  Covid Evaluation: Asymptomatic Screening Protocol (No Symptoms)  Diagnosis: Hypotension [id number]  Admitting Physician: Orene Desanctis [id number]  Attending Physician: Orene Desanctis [id number]  Estimated length of stay: past midnight tomorrow  Certification:: I certify this patient will need inpatient services for at least 2 midnights          B Medical/Surgery History Past Medical History:  Diagnosis Date   Alcohol abuse    Chronic back pain    Colitis    CVA (cerebral infarction)    Fatty liver    Gastric ulcer    GI bleed    Hepatitis    Hepatitis C   MDD (major depressive disorder)    Pancreatitis    PTSD (post-traumatic stress disorder)    Seizures ([place])    Spastic hemiplegia affecting left dominant side ([place]) 11/05/2015   Stroke ([place])    UTI (lower urinary tract infection)    Vision abnormalities    Past Surgical History:  Procedure Laterality Date   ABDOMINAL HYSTERECTOMY     APPENDECTOMY     COLONOSCOPY WITH PROPOFOL N/A 08/13/2015   Procedure: COLONOSCOPY WITH PROPOFOL;  Surgeon: Milus Banister, MD;  Location: [facility] ENDOSCOPY;  Service: Endoscopy;  Laterality: N/A;   ESOPHAGOGASTRODUODENOSCOPY N/A 05/23/2014   Procedure: ESOPHAGOGASTRODUODENOSCOPY (EGD);  Surgeon: Jerene Bears, MD;  Location: [facility] ENDOSCOPY;  Service: Endoscopy;  Laterality: N/A;   ESOPHAGOGASTRODUODENOSCOPY (EGD) WITH PROPOFOL N/A 08/13/2015   Procedure: ESOPHAGOGASTRODUODENOSCOPY (EGD) WITH PROPOFOL;  Surgeon: Milus Banister, MD;  Location: [facility] ENDOSCOPY;  Service: Endoscopy;  Laterality: N/A;   ESOPHAGOGASTRODUODENOSCOPY ENDOSCOPY     gall  stone removed       A IV Location/Drains/Wounds Patient Lines/Drains/Airways Status     Active Line/Drains/Airways     Name Placement date Placement time Site Days   Peripheral IV 01/28/21 20 G Anterior;Left;Upper Arm 01/28/21  1630  Arm  1   Peripheral IV 01/28/21 20 G Anterior;Right Forearm 01/28/21  2157  Forearm  1            Intake/Output Last 24  hours  Intake/Output Summary (Last 24 hours) at 01/29/2021 1254 Last data filed at 01/29/2021 1144 Gross per 24 hour  Intake 832.5 ml  Output 950 ml  Net -117.5 ml    Labs/Imaging Results for orders placed or performed during the hospital encounter of 01/28/21 (from the past 48 hour(s))  Lactic acid, plasma     Status: None   Collection Time: 01/28/21  4:17 PM  Result Value Ref Range   Lactic Acid, Venous 1.8 0.5 - 1.9 mmol/L    Comment: Performed at [facility], [address] [place], [place] [address]  Comprehensive metabolic panel     Status: Abnormal   Collection Time: 01/28/21  4:17 PM  Result Value Ref Range   Sodium 136 135 - 145 mmol/L   Potassium 2.9 (L) 3.5 - 5.1 mmol/L   Chloride 99 98 - 111 mmol/L   CO2 24 22 - 32 mmol/L   Glucose, Bld 110 (H) 70 - 99 mg/dL    Comment: Glucose reference range applies only to samples taken after fasting for at least 8 hours.   BUN 18 8 - 23 mg/dL   Creatinine, Ser 2.17 (H) 0.44 - 1.00 mg/dL   Calcium 9.4 8.9 - 10.3 mg/dL   Total Protein 7.6 6.5 - 8.1 g/dL   Albumin 4.2 3.5 - 5.0 g/dL   AST 30 15 - 41 U/L   ALT 19 0 - 44 U/L   Alkaline Phosphatase 59 38 - 126 U/L   Total Bilirubin 0.9 0.3 - 1.2 mg/dL   GFR, Estimated 24 (L) >60 mL/min    Comment: (NOTE) Calculated using the CKD-EPI Creatinine Equation (2021)    Anion gap 13 5 - 15    Comment: Performed at [facility] [address], [place], [place] [address]  CBC WITH DIFFERENTIAL     Status: Abnormal   Collection Time: 01/28/21  4:17 PM  Result Value Ref Range   WBC 8.7 4.0 - 10.5 K/uL   RBC 4.40 3.87 - 5.11 MIL/uL   Hemoglobin 13.1 12.0 - 15.0 g/dL   HCT 41.2 36.0 - 46.0 %   MCV 93.6 80.0 - 100.0 fL   MCH 29.8 26.0 - 34.0 pg   MCHC 31.8 30.0 - 36.0 g/dL   RDW 13.1 11.5 - 15.5 %   Platelets 144 (L) 150 - 400 K/uL   nRBC 0.0 0.0 - 0.2 %   Neutrophils Relative % 76 %   Neutro Abs 6.6 1.7 - 7.7 K/uL   Lymphocytes Relative 15 %   Lymphs Abs 1.3 0.7 -  4.0 K/uL   Monocytes Relative 7 %   Monocytes Absolute 0.6 0.1 - 1.0 K/uL   Eosinophils Relative 1 %   Eosinophils Absolute 0.1 0.0 - 0.5 K/uL   Basophils Relative 0 %   Basophils Absolute 0.0 0.0 - 0.1 K/uL   Immature Granulocytes 1 %   Abs Immature Granulocytes 0.05 0.00 - 0.07 K/uL    Comment: Performed at [facility] [address],  [place], [place] [address]  Protime-INR     Status: None   Collection Time: 01/28/21  4:17 PM  Result Value Ref Range   Prothrombin Time 14.4 11.4 - 15.2 seconds   INR 1.1 0.8 - 1.2    Comment: (NOTE) INR goal varies based on device and disease states. Performed at [facility], [place] [address] [place], [place] [address]   APTT     Status: None   Collection Time: 01/28/21  4:17 PM  Result Value Ref Range   aPTT 24 24 - 36 seconds    Comment: Performed at [facility] [address] [place], [place] [address]  Urinalysis, Routine w reflex microscopic     Status: Abnormal   Collection Time: 01/28/21  4:17 PM  Result Value Ref Range   Color, Urine YELLOW YELLOW   APPearance HAZY (A) CLEAR   Specific Gravity, Urine 1.012 1.005 - 1.030   pH 6.0 5.0 - 8.0   Glucose, UA NEGATIVE NEGATIVE mg/dL   Hgb urine dipstick SMALL (A) NEGATIVE   Bilirubin Urine NEGATIVE NEGATIVE   Ketones, ur NEGATIVE NEGATIVE mg/dL   Protein, ur 30 (A) NEGATIVE mg/dL   Nitrite NEGATIVE NEGATIVE   Leukocytes,Ua NEGATIVE NEGATIVE   RBC / HPF 0-5 0 - 5 RBC/hpf   WBC, UA 0-5 0 - 5 WBC/hpf   Bacteria, UA RARE (A) NONE SEEN   Squamous Epithelial / LPF 0-5 0 - 5    Comment: Performed at [facility] [address] [place], [place] [address]  I-Stat Chem 8, ED     Status: Abnormal   Collection Time: 01/28/21  4:20 PM  Result Value Ref Range   Sodium 138 135 - 145 mmol/L   Potassium 4.4 3.5 - 5.1 mmol/L   Chloride 104 98 - 111 mmol/L   BUN 25 (H) 8 - 23 mg/dL   Creatinine, Ser 2.20 (H) 0.44 - 1.00 mg/dL   Glucose, Bld 99 70 - 99 mg/dL     Comment: Glucose reference range applies only to samples taken after fasting for at least 8 hours.   Calcium, Ion 1.06 (L) 1.15 - 1.40 mmol/L   TCO2 26 22 - 32 mmol/L   Hemoglobin 15.3 (H) 12.0 - 15.0 g/dL   HCT 45.0 36.0 - 46.0 %  Resp Panel by RT-PCR (Flu A&B, Covid) Nasopharyngeal Swab     Status: Abnormal   Collection Time: 01/28/21  4:20 PM   Specimen: Nasopharyngeal Swab; Nasopharyngeal(NP) swabs in vial transport medium  Result Value Ref Range   SARS Coronavirus 2 by RT PCR NEGATIVE NEGATIVE    Comment: (NOTE) SARS-CoV-2 target nucleic acids are NOT DETECTED.  The SARS-CoV-2 RNA is generally detectable in upper respiratory specimens during the acute phase of infection. The lowest concentration of SARS-CoV-2 viral copies this assay can detect is 138 copies/mL. A negative result does not preclude SARS-Cov-2 infection and should not be used as the sole basis for treatment or other patient management decisions. A negative result may occur with  improper specimen collection/handling, submission of specimen other than nasopharyngeal swab, presence of viral mutation(s) within the areas targeted by this assay, and inadequate number of viral copies(<138 copies/mL). A negative result must be combined with clinical observations, patient history, and epidemiological information. The expected result is Negative.  Fact Sheet for Patients:  EntrepreneurPulse.com.au  Fact Sheet for Healthcare Providers:  IncredibleEmployment.be  This test is no t yet approved or cleared by the [place] and  has been authorized for detection and/or diagnosis of SARS-CoV-2 by FDA under an Emergency Use Authorization (EUA). This EUA will remain  in effect (meaning this test can be used) for the duration of the COVID-19 declaration under Section 564(b)(1) of the Act, 21 U.S.C.section 360bbb-3(b)(1), unless the authorization is terminated  or revoked sooner.        Influenza A by PCR POSITIVE (A) NEGATIVE   Influenza B by PCR NEGATIVE NEGATIVE    Comment: (NOTE) The Xpert Xpress SARS-CoV-2/FLU/RSV plus assay is intended as an aid in the diagnosis of influenza from Nasopharyngeal swab specimens and should not be used as a sole basis for treatment. Nasal washings and aspirates are unacceptable for Xpert Xpress SARS-CoV-2/FLU/RSV testing.  Fact Sheet for Patients: EntrepreneurPulse.com.au  Fact Sheet for Healthcare Providers: IncredibleEmployment.be  This test is not yet approved or cleared by the [place] FDA and has been authorized for detection and/or diagnosis of SARS-CoV-2 by FDA under an Emergency Use Authorization (EUA). This EUA will remain in effect (meaning this test can be used) for the duration of the COVID-19 declaration under Section 564(b)(1) of the Act, 21 U.S.C. section 360bbb-3(b)(1), unless the authorization is terminated or revoked.  Performed at [facility], [place] [address], [place], [place] [address]   I-Stat venous blood gas, ED     Status: Abnormal   Collection Time: 01/28/21  4:22 PM  Result Value Ref Range   pH, Ven 7.408 7.250 - 7.430   pCO2, Ven 43.2 (L) 44.0 - 60.0 mmHg   pO2, Ven 58.0 (H) 32.0 - 45.0 mmHg   Bicarbonate 27.3 20.0 - 28.0 mmol/L   TCO2 29 22 - 32 mmol/L   O2 Saturation 90.0 %   Acid-Base Excess 2.0 0.0 - 2.0 mmol/L   Sodium 138 135 - 145 mmol/L   Potassium 4.4 3.5 - 5.1 mmol/L   Calcium, Ion 1.06 (L) 1.15 - 1.40 mmol/L   HCT 42.0 36.0 - 46.0 %   Hemoglobin 14.3 12.0 - 15.0 g/dL   Sample type VENOUS   Ethanol     Status: None   Collection Time: 01/28/21  4:49 PM  Result Value Ref Range   Alcohol, Ethyl (B) <10 <10 mg/dL    Comment: (NOTE) Lowest detectable limit for serum alcohol is 10 mg/dL.  For medical purposes only. Performed at [facility], [place] [address] [place], [place] [address]   TSH     Status: Abnormal   Collection  Time: 01/28/21  4:58 PM  Result Value Ref Range   TSH 7.715 (H) 0.350 - 4.500 uIU/mL    Comment: Performed by a 3rd Generation assay with a functional sensitivity of <=0.01 uIU/mL. Performed at [facility], [place] [address], [place], [place] [address]   T4, free     Status: None   Collection Time: 01/28/21  4:58 PM  Result Value Ref Range   Free T4 0.77 0.61 - 1.12 ng/dL    Comment: (NOTE) Biotin ingestion may interfere with free T4 tests. If the results are inconsistent with the TSH level, previous test results, or the clinical presentation, then consider biotin interference. If needed, order repeat testing after stopping biotin. Performed at [facility], [place] [address], [place], [place] [address]   CBG monitoring, ED     Status: None   Collection Time: 01/28/21  6:18 PM  Result Value Ref Range   Glucose-Capillary 89 70 - 99 mg/dL    Comment: Glucose reference range  applies only to samples taken after fasting for at least 8 hours.  Ammonia     Status: None   Collection Time: 01/28/21  7:14 PM  Result Value Ref Range   Ammonia 18 9 - 35 umol/L    Comment: Performed at [facility], [place] [address], [place], [place] [address]  HIV Antibody (routine testing w rflx)     Status: None   Collection Time: 01/28/21 10:04 PM  Result Value Ref Range   HIV Screen 4th Generation wRfx Non Reactive Non Reactive    Comment: Performed at [facility], [place] [address], [place], [place] [address]  Magnesium     Status: None   Collection Time: 01/28/21 10:04 PM  Result Value Ref Range   Magnesium 1.8 1.7 - 2.4 mg/dL    Comment: Performed at [facility], [place] [address], [place], [place] [address]  CBG monitoring, ED     Status: None   Collection Time: 01/29/21  6:08 AM  Result Value Ref Range   Glucose-Capillary 73 70 - 99 mg/dL    Comment: Glucose reference range applies only to samples taken after fasting for at least 8 hours.  CBC     Status: Abnormal    Collection Time: 01/29/21  6:15 AM  Result Value Ref Range   WBC 6.3 4.0 - 10.5 K/uL   RBC 3.51 (L) 3.87 - 5.11 MIL/uL   Hemoglobin 10.5 (L) 12.0 - 15.0 g/dL   HCT 32.6 (L) 36.0 - 46.0 %   MCV 92.9 80.0 - 100.0 fL   MCH 29.9 26.0 - 34.0 pg   MCHC 32.2 30.0 - 36.0 g/dL   RDW 13.2 11.5 - 15.5 %   Platelets 121 (L) 150 - 400 K/uL   nRBC 0.0 0.0 - 0.2 %    Comment: Performed at [facility] [address], [place], [place] [id number]  Basic metabolic panel     Status: Abnormal   Collection Time: 01/29/21  6:15 AM  Result Value Ref Range   Sodium 140 135 - 145 mmol/L   Potassium 3.0 (L) 3.5 - 5.1 mmol/L   Chloride 111 98 - 111 mmol/L   CO2 23 22 - 32 mmol/L   Glucose, Bld 76 70 - 99 mg/dL    Comment: Glucose reference range applies only to samples taken after fasting for at least 8 hours.   BUN 13 8 - 23 mg/dL   Creatinine, Ser 1.07 (H) 0.44 - 1.00 mg/dL    Comment: DELTA CHECK NOTED   Calcium 8.6 (L) 8.9 - 10.3 mg/dL   GFR, Estimated 57 (L) >60 mL/min    Comment: (NOTE) Calculated using the CKD-EPI Creatinine Equation (2021)    Anion gap 6 5 - 15    Comment: Performed at [facility] [address], [place], [place] [address]   CT Head Wo Contrast  Result Date: 01/28/2021 CLINICAL DATA:  Syncope.  Delirium. EXAM: CT HEAD WITHOUT CONTRAST TECHNIQUE: Contiguous axial images were obtained from the base of the skull through the vertex without intravenous contrast. COMPARISON:  09/26/2015 FINDINGS: Brain: Periventricular low-density on the right could reflect lacunar infarct or chronic small vessel disease. This appears chronic. No acute intracranial abnormality. Specifically, no hemorrhage, hydrocephalus, mass lesion, acute infarction, or significant intracranial injury. Vascular: No hyperdense vessel or unexpected calcification. Skull: No acute calvarial abnormality. Sinuses/Orbits: Mucosal thickening in the left maxillary sinus and ethmoid air cells. No air-fluid levels.  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 No acute intracranial abnormality. Chronic sinusitis. Electronically Signed   By: Charlett Nose M.D.   On: 01/28/2021 18:23   CT Cervical Spine Wo Contrast  Result Date: 01/28/2021 CLINICAL DATA:  Syncope.  Neck trauma (Age >= 65y) EXAM: CT CERVICAL SPINE WITHOUT CONTRAST TECHNIQUE: Multidetector CT imaging of the cervical spine was performed without intravenous contrast. Multiplanar CT image reconstructions were also generated. COMPARISON:  None. FINDINGS: Alignment: Normal. Skull base and vertebrae: No acute fracture. No primary bone lesion or focal pathologic process. Soft tissues and spinal canal: No prevertebral fluid or swelling. No visible canal hematoma. Disc levels: Slight disc space narrowing and early spurring at C5-6. Upper chest: No acute findings Other: None IMPRESSION: No acute bony abnormality. Electronically Signed   By: Charlett Nose M.D.   On: 01/28/2021 18:24   DG Chest Port 1 View  Result Date: 01/28/2021 CLINICAL DATA:  Questionable sepsis EXAM: PORTABLE CHEST 1 VIEW COMPARISON:  12/12/2015 FINDINGS: Heart and mediastinal contours are within normal limits. No focal opacities or effusions. No acute bony abnormality. IMPRESSION: No active disease. Electronically Signed   By: Charlett Nose M.D.   On: 01/28/2021 18:42   DG Foot 2 Views Right  Result Date: 01/28/2021 CLINICAL DATA:  Right foot pain. EXAM: RIGHT FOOT - 2 VIEW COMPARISON:  Right foot radiograph dated 03/06/2014. FINDINGS: There is no acute fracture or dislocation. The bones are osteopenic. No significant arthritic changes. The soft tissues are grossly unremarkable. IMPRESSION: No acute fracture or dislocation. Electronically Signed   By: Elgie Collard M.D.   On: 01/28/2021 20:49    Pending Labs Unresulted Labs (From admission, onward)     Start     Ordered   01/28/21 1619  Blood gas, venous  Once,   R         01/28/21 1618   01/28/21 1617  Lactic acid, plasma  (Undifferentiated presentation (screening labs and basic nursing orders))  Now then every 2 hours,   STAT      01/28/21 1616   01/28/21 1617  Blood Culture (routine x 2)  (Undifferentiated presentation (screening labs and basic nursing orders))  BLOOD CULTURE X 2,   STAT      01/28/21 1616   01/28/21 1617  Urine Culture  (Undifferentiated presentation (screening labs and basic nursing orders))  ONCE - STAT,   STAT       Question:  Indication  Answer:  Sepsis   01/28/21 1616            Vitals/Pain Today's Vitals   01/29/21 0900 01/29/21 0909 01/29/21 1011 01/29/21 1100  BP: 109/71   (!) 120/57  Pulse: 62 61  60  Resp: 16 17  11   Temp:  98.5 F (36.9 C)    TempSrc:  Oral    SpO2: 98% 97%  97%  Weight:      Height:      PainSc:  9  5      Isolation Precautions Droplet precaution  Medications Medications  oseltamivir (TAMIFLU) capsule 75 mg (75 mg Oral Given 01/29/21 0908)  lactated ringers infusion (0 mLs Intravenous Stopped 01/29/21 0908)  sodium chloride flush (NS) 0.9 % injection 3 mL (3 mLs Intravenous Not Given 01/29/21 0904)  heparin injection 5,000 Units (5,000 Units Subcutaneous Given 01/29/21 0605)  clopidogrel (PLAVIX) tablet 75 mg (75 mg Oral Given 01/29/21 0903)  zolpidem (AMBIEN) tablet 5 mg (5 mg Oral Given 01/29/21 0237)  atorvastatin (LIPITOR) tablet 20 mg (20 mg Oral Given 01/29/21 1159)  fluticasone (FLONASE) 50 MCG/ACT nasal spray 1 spray (has no administration in time range)  gabapentin (NEURONTIN) capsule 300 mg (300 mg Oral Given 01/29/21 1159)  ibuprofen (ADVIL) tablet 800 mg (has no administration in time range)  rOPINIRole (REQUIP) tablet 0.25 mg (has no administration in time range)  sertraline (ZOLOFT) tablet 50 mg (has no administration in time range)  thiamine tablet 100 mg (100 mg Oral Given 01/29/21 1159)  timolol (TIMOPTIC) 0.5 % ophthalmic solution 1 drop (1 drop Both Eyes Given 01/29/21  1200)  latanoprost (XALATAN) 0.005 % ophthalmic solution 1 drop (has no administration in time range)  traZODone (DESYREL) tablet 100 mg (has no administration in time range)  acetaminophen (TYLENOL) tablet 650 mg (has no administration in time range)  traMADol (ULTRAM) tablet 50 mg (has no administration in time range)  sodium chloride 0.9 % bolus 2,000 mL (0 mLs Intravenous Stopped 01/28/21 1739)  oseltamivir (TAMIFLU) capsule 75 mg (75 mg Oral Given 01/28/21 2233)  potassium chloride 10 mEq in 100 mL IVPB (0 mEq Intravenous Stopped 01/29/21 0005)  potassium chloride SA (KLOR-CON) CR tablet 40 mEq (40 mEq Oral Given 01/29/21 1135)    Mobility walks Low fall risk   Focused Assessments Cardiac Assessment Handoff:    Lab Results  Component Value Date   CKTOTAL 246 (H) 01/01/2009   CKMB 2.1 01/01/2009   TROPONINI <0.03 05/22/2014   Lab Results  Component Value Date   DDIMER  02/02/2007    0.32        AT THE INHOUSE ESTABLISHED CUTOFF VALUE OF 0.48 ug/mL FEU, THIS ASSAY HAS BEEN DOCUMENTED IN THE LITERATURE TO HAVE   Does the Patient currently have chest pain? No    R Recommendations: See Admitting Provider Note  Report given to:   Additional Notes:

## 2021-01-29 NOTE — Progress Notes (Signed)
Admission from the ED by bed awake and alert. 

## 2021-01-30 DIAGNOSIS — I9589 Other hypotension: Secondary | ICD-10-CM | POA: Diagnosis not present

## 2021-01-30 DIAGNOSIS — E860 Dehydration: Secondary | ICD-10-CM | POA: Diagnosis not present

## 2021-01-30 DIAGNOSIS — J45909 Unspecified asthma, uncomplicated: Secondary | ICD-10-CM | POA: Diagnosis not present

## 2021-01-30 DIAGNOSIS — N179 Acute kidney failure, unspecified: Secondary | ICD-10-CM | POA: Diagnosis not present

## 2021-01-30 DIAGNOSIS — R55 Syncope and collapse: Secondary | ICD-10-CM | POA: Diagnosis not present

## 2021-01-30 DIAGNOSIS — J101 Influenza due to other identified influenza virus with other respiratory manifestations: Secondary | ICD-10-CM | POA: Diagnosis not present

## 2021-01-30 LAB — CBC
HCT: 33.6 % — ABNORMAL LOW (ref 36.0–46.0)
Hemoglobin: 11 g/dL — ABNORMAL LOW (ref 12.0–15.0)
MCH: 29.9 pg (ref 26.0–34.0)
MCHC: 32.7 g/dL (ref 30.0–36.0)
MCV: 91.3 fL (ref 80.0–100.0)
Platelets: 123 10*3/uL — ABNORMAL LOW (ref 150–400)
RBC: 3.68 MIL/uL — ABNORMAL LOW (ref 3.87–5.11)
RDW: 13.2 % (ref 11.5–15.5)
WBC: 5.9 10*3/uL (ref 4.0–10.5)
nRBC: 0 % (ref 0.0–0.2)

## 2021-01-30 LAB — GLUCOSE, CAPILLARY
Glucose-Capillary: 80 mg/dL (ref 70–99)
Glucose-Capillary: 72 mg/dL (ref 70–99)

## 2021-01-30 LAB — BASIC METABOLIC PANEL
Anion gap: 7 (ref 5–15)
BUN: 7 mg/dL — ABNORMAL LOW (ref 8–23)
CO2: 23 mmol/L (ref 22–32)
Calcium: 9 mg/dL (ref 8.9–10.3)
Chloride: 112 mmol/L — ABNORMAL HIGH (ref 98–111)
Creatinine, Ser: 0.79 mg/dL (ref 0.44–1.00)
GFR, Estimated: >60 mL/min (ref >60)
Glucose, Bld: 81 mg/dL (ref 70–99)
Potassium: 3.7 mmol/L (ref 3.5–5.1)
Sodium: 142 mmol/L (ref 135–145)

## 2021-01-30 LAB — URINE CULTURE

## 2021-01-30 MED ORDER — IBUPROFEN 200 MG PO TABS: 400.0000 mg | ORAL_TABLET | Freq: Three times a day (TID) | ORAL | Status: DC | PRN

## 2021-01-30 MED ORDER — LACTATED RINGERS IV SOLN: INTRAVENOUS | Status: AC

## 2021-01-30 NOTE — Progress Notes (Signed)
Physical Therapy Treatment Patient Details Name: [name] MRN: [id number] DOB: [birth year] Today's Date: 01/30/2021   History of Present Illness Pt is a 65 y.o. female admitted 01/28/21 s/p fall with syncope. Pt with hypotension, dehydration, flu A, bronchitis. All imaging negative for fx. PMH: CVA with residual left hemiparesis, HTN, GIB, asthma, PTSD, ongoing tobacco use, seizures    PT Comments    Pt very pleasant and eager to be up out of bed. Pt able to walk in hall this session with reliance on RW and noted baseline weakness of LLE. Pt educated for HEP and reports she does perform at home. Pt encouraged to be OOB for toileting and meals with RN aware. Will continue to follow.   Supine 105/66 Sitting 113/66 (79) Standing 97/62 (73)  HR 55-60 during  gait with SPO2 97% on RA   Recommendations for follow up therapy are one component of a multi-disciplinary discharge planning process, led by the attending physician.  Recommendations may be updated based on patient status, additional functional criteria and insurance authorization.  Follow Up Recommendations  No PT follow up     Assistance Recommended at Discharge Intermittent Supervision/Assistance  Equipment Recommendations  None recommended by PT    Recommendations for Other Services       Precautions / Restrictions Precautions Precautions: Fall;Other (comment) Precaution Comments: monitor BP, left hemiparesis     Mobility  Bed Mobility Overal bed mobility: Modified Independent Bed Mobility: Supine to Sit           General bed mobility comments: HOB 20 degrees with increased time    Transfers Overall transfer level: Needs assistance   Transfers: Sit to/from Stand Sit to Stand: Min guard           General transfer comment: cues for hand placement with assist for lines to rise from bed and lower to recliner    Ambulation/Gait Ambulation/Gait assistance: Min guard Gait Distance (Feet): 140  Feet Assistive device: Rolling walker (2 wheels) Gait Pattern/deviations: Step-through pattern;Decreased stride length;Decreased stance time - left   Gait velocity interpretation: 1.31 - 2.62 ft/sec, indicative of limited community ambulator   General Gait Details: pt with slow gait with decreased stance and swing on LLE due to hemiparesis. cues for proximity to RW and direction with pt able to advance gait this session without physical assist   Stairs             Wheelchair Mobility    Modified Rankin (Stroke Patients Only)       Balance Overall balance assessment: Needs assistance Sitting-balance support: No upper extremity supported;Feet supported Sitting balance-Leahy Scale: Good Sitting balance - Comments: EOB without assist   Standing balance support: Bilateral upper extremity supported Standing balance-Leahy Scale: Poor Standing balance comment: Reliant on RW.                            Cognition Arousal/Alertness: Awake/alert Behavior During Therapy: WFL for tasks assessed/performed Overall Cognitive Status: Within Functional Limits for tasks assessed                                          Exercises General Exercises - Lower Extremity Long Arc Quad: AROM;Both;Seated;15 reps Hip Flexion/Marching: AROM;Both;Seated;15 reps    General Comments        Pertinent Vitals/Pain Pain Assessment: No/denies pain    Home Living  Prior Function            PT Goals (current goals can now be found in the care plan section) Progress towards PT goals: Progressing toward goals    Frequency    Min 3X/week      PT Plan Current plan remains appropriate    Co-evaluation              AM-PAC PT "6 Clicks" Mobility   Outcome Measure  Help needed turning from your back to your side while in a flat bed without using bedrails?: None Help needed moving from lying on your back to sitting on  the side of a flat bed without using bedrails?: None Help needed moving to and from a bed to a chair (including a wheelchair)?: A Little Help needed standing up from a chair using your arms (e.g., wheelchair or bedside chair)?: A Little Help needed to walk in hospital room?: A Little Help needed climbing 3-5 steps with a railing? : A Little 6 Click Score: 20    End of Session   Activity Tolerance: Patient tolerated treatment well Patient left: in chair;with call bell/phone within reach;with chair alarm set Nurse Communication: Mobility status PT Visit Diagnosis: Other abnormalities of gait and mobility (R26.89);Muscle weakness (generalized) (M62.81);Difficulty in walking, not elsewhere classified (R26.2)     Time: 4259-5638 PT Time Calculation (min) (ACUTE ONLY): 23 min  Charges:  $Gait Training: 8-22 mins $Therapeutic Exercise: 8-22 mins                     Yasser Hepp P, PT Acute Rehabilitation Services Pager: [phone] Office: [phone]    [name] 01/30/2021, 12:29 PM

## 2021-01-30 NOTE — Progress Notes (Signed)
PROGRESS NOTE  [name]  YSA:[id number] DOB: 22-Feb-1956 DOA: 01/28/2021 PCP: Rometta Emery, MD   Brief Narrative: 65 year old female, lives with her adult niece, medical history significant for CVA with residual left hemiparesis, ambulates with the help of a walker or cane, hypertension, asthma, GI bleed, PTSD, ongoing tobacco use disorder, who presented to the ED following a syncopal episode.  She also had 3 days history of feeling unwell, cough, runny nose, diarrhea, nausea, vomiting and decreased appetite.  On EMS arrival, SBP's reportedly in the 70s, reportedly became less responsive in the ambulance requiring bag-valve-mask.  In ED, afebrile, hypotensive with SBP in the 80s.  Admitted for syncope likely secondary to hypotension from dehydration related to influenza A acute bronchitis.  Assessment & Plan: Principal Problem:   Hypotension Active Problems:   Asthma   History of stroke   Hypokalemia   AKI (acute kidney injury) (HCC)   Prolonged QT interval   Influenza A  Syncope and collapse, secondary to hypotension from dehydration: No WMA or LV or RV dysfunction or severe valvular heart disease on echocardiogram. No significant dysrhythmias dueing hospitalization thus far. CT head and neck without acute findings. - Remains hypotensive, will continue IVF for now.  - Hold home antihypertensives - PT/OT to continue.    Influenza A acute bronchitis: Her respiratory and GI symptoms likely related to this.  Likely contracted this from her 52-year-old nephew who had it and was visiting her. No hypoxia or pneumonia on CXR.  - Continue renally-dosed tamiflu 30mg  po BID to complete 10 doses.  - Symptomatic management otherwise   Dehydration and hypotension - Continues, continue IVF, Renal function still improving.  - Improved after IV fluids, continue for additional 24 hours  Acute kidney injury: Due to dehydration, HCTZ and lisinopril -  Presented with creatinine of 2.17, continues  to trend downward. Monitor in AM. CrCl estimated to be 44ml/min today.  Hypokalemia: Resolved with supplementation.  Acute anemia, unclear etiology. Anemia panel in past wnl. Unmasked by rehydration.  - Normocytic, no bleeding. Consider anemia work up as outpatient.   Thrombocytopenia: Chronic, stable  Essential hypertension - Holding home meds with soft BPs for now.   Abnormal TSH: TSH 7.715 but free T4 0.77.  Likely sick euthyroid. - Recommend repeating in 4 to 6 weeks.  Right ankle pain s/p fall: Right foot x-ray without acute abnormality.  - Supportive care.  History of CVA with residual left hemiparesis - Continue plavix, statin  Asthma: Not in acute exacerbation.  Prolonged QTC:  EKG on 11/17 with QTC of 503 ms. - Continue telemetry monitoring. - Avoid QT prolonging meds.  DVT prophylaxis: Heparin Code Status: Full Family Communication: None at bedside Disposition Plan:  Status is: Inpatient  Remains inpatient appropriate because: Continues to have such poor oral intake it's contributing to hypotension in patient who presented with syncope. Remaining on IVF at least another 24 hours.  Consultants:  None  Procedures:  None  Antimicrobials: Tamiflu   Subjective: Feels diffusely weak worse than her baseline of L > RLE's. No chest pain or dyspnea. Feels lightheaded at rest and worse with standing. Having nausea limiting oral intake.   Objective: Vitals:   01/30/21 0700 01/30/21 0800 01/30/21 0900 01/30/21 1131  BP:  115/64 113/63 121/61  Pulse:  (!) 56 65 (!) 51  Resp:  17 19 18   Temp: 98.1 F (36.7 C) 98.1 F (36.7 C)  98.1 F (36.7 C)  TempSrc:  Oral  Oral  SpO2:  96%  96% 98%  Weight:      Height:        Intake/Output Summary (Last 24 hours) at 01/30/2021 1233 Last data filed at 01/30/2021 0800 Gross per 24 hour  Intake 465 ml  Output 2375 ml  Net -1910 ml   Filed Weights   01/28/21 2156 01/30/21 0659  Weight: 62.1 kg 56 kg    Gen: 65  y.o. female in no distress, fatigued-appearance Pulm: Non-labored breathing. mild rhonchi bilaterally.  CV: Regular rate and rhythm. No murmur, rub, or gallop. No JVD, no pitting pedal edema. GI: Abdomen soft, non-tender, non-distended, with normoactive bowel sounds. No organomegaly or masses felt. Ext: Warm, no deformities Skin: No rashes, lesions or ulcers on visualized skin Neuro: Alert and oriented. No new focal neurological deficits. Psych: Judgement and insight appear normal. Mood & affect appropriate.   Data Reviewed: I have personally reviewed following labs and imaging studies  CBC: Recent Labs  Lab 01/28/21 1617 01/28/21 1620 01/28/21 1622 01/29/21 0615 01/30/21 0057  WBC 8.7  --   --  6.3 5.9  NEUTROABS 6.6  --   --   --   --   HGB 13.1 15.3* 14.3 10.5* 11.0*  HCT 41.2 45.0 42.0 32.6* 33.6*  MCV 93.6  --   --  92.9 91.3  PLT 144*  --   --  121* 123*   Basic Metabolic Panel: Recent Labs  Lab 01/28/21 1617 01/28/21 1620 01/28/21 1622 01/28/21 2204 01/29/21 0615 01/30/21 0057  NA 136 138 138  --  140 142  K 2.9* 4.4 4.4  --  3.0* 3.7  CL 99 104  --   --  111 112*  CO2 24  --   --   --  23 23  GLUCOSE 110* 99  --   --  76 81  BUN 18 25*  --   --  13 7*  CREATININE 2.17* 2.20*  --   --  1.07* 0.79  CALCIUM 9.4  --   --   --  8.6* 9.0  MG  --   --   --  1.8  --   --    GFR: Estimated Creatinine Clearance: 56.4 mL/min (by C-G formula based on SCr of 0.79 mg/dL). Liver Function Tests: Recent Labs  Lab 01/28/21 1617  AST 30  ALT 19  ALKPHOS 59  BILITOT 0.9  PROT 7.6  ALBUMIN 4.2   No results for input(s): LIPASE, AMYLASE in the last 168 hours. Recent Labs  Lab 01/28/21 1914  AMMONIA 18   Coagulation Profile: Recent Labs  Lab 01/28/21 1617  INR 1.1   Cardiac Enzymes: No results for input(s): CKTOTAL, CKMB, CKMBINDEX, TROPONINI in the last 168 hours. BNP (last 3 results) No results for input(s): PROBNP in the last 8760 hours. HbA1C: No  results for input(s): HGBA1C in the last 72 hours. CBG: Recent Labs  Lab 01/28/21 1818 01/29/21 0608 01/30/21 0647  GLUCAP 89 73 80   Lipid Profile: No results for input(s): CHOL, HDL, LDLCALC, TRIG, CHOLHDL, LDLDIRECT in the last 72 hours. Thyroid Function Tests: Recent Labs    01/28/21 1658  TSH 7.715*  FREET4 0.77   Anemia Panel: No results for input(s): VITAMINB12, FOLATE, FERRITIN, TIBC, IRON, RETICCTPCT in the last 72 hours. Urine analysis:    Component Value Date/Time   COLORURINE YELLOW 01/28/2021 1617   APPEARANCEUR HAZY (A) 01/28/2021 1617   APPEARANCEUR Clear 12/10/2015 1023   LABSPEC 1.012 01/28/2021 1617   PHURINE 6.0 01/28/2021  1617   GLUCOSEU NEGATIVE 01/28/2021 1617   HGBUR SMALL (A) 01/28/2021 1617   BILIRUBINUR NEGATIVE 01/28/2021 1617   BILIRUBINUR Negative 12/10/2015 1023   KETONESUR NEGATIVE 01/28/2021 1617   PROTEINUR 30 (A) 01/28/2021 1617   UROBILINOGEN 0.2 05/22/2014 0451   NITRITE NEGATIVE 01/28/2021 1617   LEUKOCYTESUR NEGATIVE 01/28/2021 1617   Recent Results (from the past 240 hour(s))  Urine Culture     Status: Abnormal   Collection Time: 01/28/21  4:17 PM   Specimen: In/Out Cath Urine  Result Value Ref Range Status   Specimen Description IN/OUT CATH URINE  Final   Special Requests   Final    NONE Performed at [facility], [address] [place], [place] [address]    Culture MULTIPLE SPECIES PRESENT, SUGGEST RECOLLECTION (A)  Final   Report Status 01/30/2021 FINAL  Final  Resp Panel by RT-PCR (Flu A&B, Covid) Nasopharyngeal Swab     Status: Abnormal   Collection Time: 01/28/21  4:20 PM   Specimen: Nasopharyngeal Swab; Nasopharyngeal(NP) swabs in vial transport medium  Result Value Ref Range Status   SARS Coronavirus 2 by RT PCR NEGATIVE NEGATIVE Final    Comment: (NOTE) SARS-CoV-2 target nucleic acids are NOT DETECTED.  The SARS-CoV-2 RNA is generally detectable in upper respiratory specimens during the acute phase of  infection. The lowest concentration of SARS-CoV-2 viral copies this assay can detect is 138 copies/mL. A negative result does not preclude SARS-Cov-2 infection and should not be used as the sole basis for treatment or other patient management decisions. A negative result may occur with  improper specimen collection/handling, submission of specimen other than nasopharyngeal swab, presence of viral mutation(s) within the areas targeted by this assay, and inadequate number of viral copies(<138 copies/mL). A negative result must be combined with clinical observations, patient history, and epidemiological information. The expected result is Negative.  Fact Sheet for Patients:  BloggerCourse.com  Fact Sheet for Healthcare Providers:  SeriousBroker.it  This test is no t yet approved or cleared by the [place] FDA and  has been authorized for detection and/or diagnosis of SARS-CoV-2 by FDA under an Emergency Use Authorization (EUA). This EUA will remain  in effect (meaning this test can be used) for the duration of the COVID-19 declaration under Section 564(b)(1) of the Act, 21 U.S.C.section 360bbb-3(b)(1), unless the authorization is terminated  or revoked sooner.       Influenza A by PCR POSITIVE (A) NEGATIVE Final   Influenza B by PCR NEGATIVE NEGATIVE Final    Comment: (NOTE) The Xpert Xpress SARS-CoV-2/FLU/RSV plus assay is intended as an aid in the diagnosis of influenza from Nasopharyngeal swab specimens and should not be used as a sole basis for treatment. Nasal washings and aspirates are unacceptable for Xpert Xpress SARS-CoV-2/FLU/RSV testing.  Fact Sheet for Patients: BloggerCourse.com  Fact Sheet for Healthcare Providers: SeriousBroker.it  This test is not yet approved or cleared by the [place] FDA and has been authorized for detection and/or diagnosis of  SARS-CoV-2 by FDA under an Emergency Use Authorization (EUA). This EUA will remain in effect (meaning this test can be used) for the duration of the COVID-19 declaration under Section 564(b)(1) of the Act, 21 U.S.C. section 360bbb-3(b)(1), unless the authorization is terminated or revoked.  Performed at [facility], [address], [place], [place] [address]   Blood Culture (routine x 2)     Status: None (Preliminary result)   Collection Time: 01/28/21  4:42 PM  Specimen: BLOOD  Result Value Ref Range Status   Specimen Description BLOOD LEFT ANTECUBITAL  Final   Special Requests   Final    BOTTLES DRAWN AEROBIC AND ANAEROBIC Blood Culture adequate volume   Culture   Final    NO GROWTH < 24 HOURS Performed at [facility], [address] [place], [place] [address]    Report Status PENDING  Incomplete  Blood Culture (routine x 2)     Status: None (Preliminary result)   Collection Time: 01/28/21 10:04 PM   Specimen: BLOOD  Result Value Ref Range Status   Specimen Description BLOOD SITE NOT SPECIFIED  Final   Special Requests   Final    BOTTLES DRAWN AEROBIC AND ANAEROBIC Blood Culture results may not be optimal due to an inadequate volume of blood received in culture bottles   Culture   Final    NO GROWTH < 24 HOURS Performed at [facility], [address], [place], [place] [address]    Report Status PENDING  Incomplete  MRSA Next Gen by PCR, Nasal     Status: None   Collection Time: 01/29/21  1:50 PM   Specimen: Nasal Mucosa; Nasal Swab  Result Value Ref Range Status   MRSA by PCR Next Gen NOT DETECTED NOT DETECTED Final    Comment: (NOTE) The GeneXpert MRSA Assay (FDA approved for NASAL specimens only), is one component of a comprehensive MRSA colonization surveillance program. It is not intended to diagnose MRSA infection nor to guide or monitor treatment for MRSA infections. Test performance is not FDA approved in patients less than 65  years old. Performed at [facility], [address] [place], [place] [address]       Radiology Studies: CT Head Wo Contrast  Result Date: 01/28/2021 CLINICAL DATA:  Syncope.  Delirium. EXAM: CT HEAD WITHOUT CONTRAST TECHNIQUE: Contiguous axial images were obtained from the base of the skull through the vertex without intravenous contrast. COMPARISON:  09/26/2015 FINDINGS: Brain: Periventricular low-density on the right could reflect lacunar infarct or chronic small vessel disease. This appears chronic. No acute intracranial abnormality. Specifically, no hemorrhage, hydrocephalus, mass lesion, acute infarction, or significant intracranial injury. Vascular: No hyperdense vessel or unexpected calcification. Skull: No acute calvarial abnormality. Sinuses/Orbits: Mucosal thickening in the left maxillary sinus and ethmoid air cells. No air-fluid levels. Other: None IMPRESSION: Chronic small vessel disease versus chronic right periventricular lacunar infarct. No acute intracranial abnormality. Chronic sinusitis. Electronically Signed   By: Charlett Nose M.D.   On: 01/28/2021 18:23   CT Cervical Spine Wo Contrast  Result Date: 01/28/2021 CLINICAL DATA:  Syncope.  Neck trauma (Age >= 65y) EXAM: CT CERVICAL SPINE WITHOUT CONTRAST TECHNIQUE: Multidetector CT imaging of the cervical spine was performed without intravenous contrast. Multiplanar CT image reconstructions were also generated. COMPARISON:  None. FINDINGS: Alignment: Normal. Skull base and vertebrae: No acute fracture. No primary bone lesion or focal pathologic process. Soft tissues and spinal canal: No prevertebral fluid or swelling. No visible canal hematoma. Disc levels: Slight disc space narrowing and early spurring at C5-6. Upper chest: No acute findings Other: None IMPRESSION: No acute bony abnormality. Electronically Signed   By: Charlett Nose M.D.   On: 01/28/2021 18:24   DG Chest Port 1 View  Result Date: 01/28/2021 CLINICAL DATA:   Questionable sepsis EXAM: PORTABLE CHEST 1 VIEW COMPARISON:  12/12/2015 FINDINGS: Heart and mediastinal contours are within normal limits. No focal opacities or effusions. No acute bony abnormality.  IMPRESSION: No active disease. Electronically Signed   By: Charlett Nose M.D.   On: 01/28/2021 18:42   DG Foot 2 Views Right  Result Date: 01/28/2021 CLINICAL DATA:  Right foot pain. EXAM: RIGHT FOOT - 2 VIEW COMPARISON:  Right foot radiograph dated 03/06/2014. FINDINGS: There is no acute fracture or dislocation. The bones are osteopenic. No significant arthritic changes. The soft tissues are grossly unremarkable. IMPRESSION: No acute fracture or dislocation. Electronically Signed   By: Elgie Collard M.D.   On: 01/28/2021 20:49   ECHOCARDIOGRAM COMPLETE  Result Date: 01/29/2021    ECHOCARDIOGRAM REPORT   Patient Name:   [name] Date of Exam: 01/29/2021 Medical Rec #:  [id number]    Height:       63.0 in Accession #:    [id number]   Weight:       137.0 lb Date of Birth:  [birth year]    BSA:          1.646 m Patient Age:    65 years     BP:           101/62 mmHg Patient Gender: F            HR:           61 bpm. Exam Location:  Inpatient Procedure: 2D Echo Indications:    syncope  History:        Patient has prior history of Echocardiogram examinations, most                 recent 09/27/2015.  Sonographer:    Delcie Roch RDCS Referring Phys: [id number] ANAND D HONGALGI IMPRESSIONS  1. Left ventricular ejection fraction, by estimation, is 60 to 65%. The left ventricle has normal function. The left ventricle has no regional wall motion abnormalities. Left ventricular diastolic parameters were normal.  2. Right ventricular systolic function is normal. The right ventricular size is normal. There is mildly elevated pulmonary artery systolic pressure.  3. The mitral valve is normal in structure. No evidence of mitral valve regurgitation. No evidence of mitral stenosis.  4. The aortic valve is tricuspid. Aortic  valve regurgitation is not visualized. No aortic stenosis is present.  5. The inferior vena cava is normal in size with greater than 50% respiratory variability, suggesting right atrial pressure of 3 mmHg. FINDINGS  Left Ventricle: Left ventricular ejection fraction, by estimation, is 60 to 65%. The left ventricle has normal function. The left ventricle has no regional wall motion abnormalities. The left ventricular internal cavity size was normal in size. There is  no left ventricular hypertrophy. Left ventricular diastolic parameters were normal. Indeterminate filling pressures. Right Ventricle: The right ventricular size is normal. No increase in right ventricular wall thickness. Right ventricular systolic function is normal. There is mildly elevated pulmonary artery systolic pressure. The tricuspid regurgitant velocity is 2.89  m/s, and with an assumed right atrial pressure of 3 mmHg, the estimated right ventricular systolic pressure is 36.4 mmHg. Left Atrium: Left atrial size was normal in size. Right Atrium: Right atrial size was normal in size. Pericardium: There is no evidence of pericardial effusion. Mitral Valve: The mitral valve is normal in structure. No evidence of mitral valve regurgitation. No evidence of mitral valve stenosis. Tricuspid Valve: The tricuspid valve is normal in structure. Tricuspid valve regurgitation is trivial. No evidence of tricuspid stenosis. Aortic Valve: The aortic valve is tricuspid. Aortic valve regurgitation is not visualized. No aortic stenosis is present. Pulmonic Valve: The pulmonic valve was  normal in structure. Pulmonic valve regurgitation is not visualized. No evidence of pulmonic stenosis. Aorta: The aortic root is normal in size and structure. Venous: The inferior vena cava is normal in size with greater than 50% respiratory variability, suggesting right atrial pressure of 3 mmHg. IAS/Shunts: No atrial level shunt detected by color flow Doppler.  LEFT VENTRICLE PLAX  2D LVIDd:         4.00 cm   Diastology LVIDs:         2.30 cm   LV e' medial:    7.94 cm/s LV PW:         0.80 cm   LV E/e' medial:  12.4 LV IVS:        0.70 cm   LV e' lateral:   12.40 cm/s LVOT diam:     1.60 cm   LV E/e' lateral: 7.9 LV SV:         47 LV SV Index:   28 LVOT Area:     2.01 cm  RIGHT VENTRICLE             IVC RV S prime:     12.70 cm/s  IVC diam: 1.40 cm TAPSE (M-mode): 2.1 cm LEFT ATRIUM             Index        RIGHT ATRIUM          Index LA diam:        3.00 cm 1.82 cm/m   RA Area:     9.55 cm LA Vol (A2C):   31.4 ml 19.07 ml/m  RA Volume:   18.90 ml 11.48 ml/m LA Vol (A4C):   27.1 ml 16.46 ml/m LA Biplane Vol: 29.2 ml 17.74 ml/m  AORTIC VALVE LVOT Vmax:   97.90 cm/s LVOT Vmean:  61.400 cm/s LVOT VTI:    0.232 m  AORTA Ao Root diam: 2.50 cm Ao Asc diam:  3.00 cm MITRAL VALVE               TRICUSPID VALVE MV Area (PHT): 3.51 cm    TR Peak grad:   33.4 mmHg MV Decel Time: 216 msec    TR Vmax:        289.00 cm/s MV E velocity: 98.10 cm/s MV A velocity: 74.40 cm/s  SHUNTS MV E/A ratio:  1.32        Systemic VTI:  0.23 m                            Systemic Diam: 1.60 cm Chilton Si MD Electronically signed by Chilton Si MD Signature Date/Time: 01/29/2021/7:15:53 PM    Final     Scheduled Meds:  atorvastatin  20 mg Oral Daily   clopidogrel  75 mg Oral Daily   gabapentin  300 mg Oral TID   heparin  5,000 Units Subcutaneous Q8H   latanoprost  1 drop Both Eyes QHS   oseltamivir  30 mg Oral BID   rOPINIRole  0.25 mg Oral Q8H   sertraline  50 mg Oral QHS   sodium chloride flush  3 mL Intravenous Q12H   thiamine  100 mg Oral Daily   timolol  1 drop Both Eyes q morning   traZODone  100 mg Oral QHS   zolpidem  5 mg Oral QHS   Continuous Infusions:   LOS: 2 days   Time spent: 25 minutes.  Tyrone Nine, MD [facility] [email or web address]  01/30/2021, 12:33 PM

## 2021-01-31 DIAGNOSIS — J45909 Unspecified asthma, uncomplicated: Secondary | ICD-10-CM | POA: Diagnosis not present

## 2021-01-31 DIAGNOSIS — R55 Syncope and collapse: Secondary | ICD-10-CM | POA: Diagnosis not present

## 2021-01-31 DIAGNOSIS — N179 Acute kidney failure, unspecified: Secondary | ICD-10-CM | POA: Diagnosis not present

## 2021-01-31 DIAGNOSIS — Z8673 Personal history of transient ischemic attack (TIA), and cerebral infarction without residual deficits: Secondary | ICD-10-CM | POA: Diagnosis not present

## 2021-01-31 DIAGNOSIS — I9589 Other hypotension: Secondary | ICD-10-CM | POA: Diagnosis not present

## 2021-01-31 DIAGNOSIS — E860 Dehydration: Secondary | ICD-10-CM | POA: Diagnosis not present

## 2021-01-31 LAB — GLUCOSE, CAPILLARY
Glucose-Capillary: 82 mg/dL (ref 70–99)
Glucose-Capillary: 100 mg/dL — ABNORMAL HIGH (ref 70–99)

## 2021-01-31 LAB — RENAL FUNCTION PANEL
Albumin: 3.2 g/dL — ABNORMAL LOW (ref 3.5–5.0)
Anion gap: 11 (ref 5–15)
BUN: 5 mg/dL — ABNORMAL LOW (ref 8–23)
CO2: 23 mmol/L (ref 22–32)
Calcium: 9.1 mg/dL (ref 8.9–10.3)
Chloride: 111 mmol/L (ref 98–111)
Creatinine, Ser: 0.74 mg/dL (ref 0.44–1.00)
GFR, Estimated: >60 mL/min (ref >60)
Glucose, Bld: 98 mg/dL (ref 70–99)
Phosphorus: 3.4 mg/dL (ref 2.5–4.6)
Potassium: 3.4 mmol/L — ABNORMAL LOW (ref 3.5–5.1)
Sodium: 145 mmol/L (ref 135–145)

## 2021-01-31 MED ORDER — POTASSIUM CHLORIDE CRYS ER 20 MEQ PO TBCR
20.0000 meq | EXTENDED_RELEASE_TABLET | Freq: Once | ORAL | Status: AC
  Administered 2021-01-31: 20 meq via ORAL
  Filled 2021-01-31: qty 1

## 2021-01-31 MED ORDER — OSELTAMIVIR PHOSPHATE 30 MG PO CAPS
30.0000 mg | ORAL_CAPSULE | Freq: Two times a day (BID) | ORAL | 0 refills | Status: DC
Start: 2021-01-31 — End: 2023-02-21

## 2021-01-31 MED ORDER — ACETAMINOPHEN 325 MG PO TABS: 650.0000 mg | ORAL_TABLET | Freq: Four times a day (QID) | ORAL | 0 refills | Status: AC | PRN

## 2021-01-31 NOTE — Discharge Summary (Addendum)
Physician Discharge Summary  [name] GEX:[id number] DOB: [birth year] DOA: 01/28/2021  PCP: Rometta Emery, MD  Admit date: 01/28/2021 Discharge date: 01/31/2021  Admitted From: Home Disposition: Home   Recommendations for Outpatient Follow-up:  Follow up with PCP in 1-2 weeks with recheck BMP. Avoidance of NSAIDs advised.  Recheck TSH, T4 in 4-6 weeks after acute illness.  Home Health: None Equipment/Devices: None Discharge Condition: Stable CODE STATUS: Full Diet recommendation: Return to heart healthy now that taking adequate oral intake.  Brief/Interim Summary: 65 year old female, lives with her adult niece, medical history significant for CVA with residual left hemiparesis, ambulates with the help of a walker or cane, hypertension, asthma, GI bleed, PTSD, ongoing tobacco use disorder, who presented to the ED 11/17 after a syncopal episode in the setting of 3 days of feeling unwell, cough, runny nose, diarrhea, nausea, vomiting and decreased appetite. On EMS arrival, SBP's reportedly in the 70s, reportedly became less responsive in the ambulance requiring bag-valve-mask. In ED, afebrile, hypotensive with SBP in the 80s improving with IV fluids and found to have influenza A. Tamiflu was started, IV fluids continued with resolution of syncope/presyncope. Oral intake has improved as of 11/20 and the patient is stable for discharge with plans to complete tamiflu at home.  Discharge Diagnoses:  Principal Problem:   Hypotension Active Problems:   Asthma   History of stroke   Hypokalemia   AKI (acute kidney injury) (HCC)   Prolonged QT interval   Influenza A  Syncope and collapse, secondary to hypotension from dehydration: No WMA or LV or RV dysfunction or severe valvular heart disease on echocardiogram. No significant dysrhythmias dueing hospitalization thus far. CT head and neck without acute findings. Since etiology is known and seizure and cardiogenic syncope are not strongly  suspected, no restrictions are necessary at this time.  - Symptoms resolved, ambulating at baseline.  - No ongoing PT recommended after evaluation.   Influenza A acute bronchitis: Her respiratory and GI symptoms likely related to this.  Likely contracted this from her 100-year-old nephew who had it and was visiting her. No hypoxia or pneumonia on CXR.  - Continue renally-dosed tamiflu 30mg  po BID to complete 10 doses.  - Symptomatic management otherwise   Dehydration and hypotension: Resolved. Pt now able to maintain hydration enterally.   Acute kidney injury: Due to dehydration, HCTZ and lisinopril -  CrCl continued to improve with Cr 0.74 on day of discharge with estimated CrCl of 53ml/min consistent with stage IIIa CKD in addition to AKI.  Hypokalemia: Supplemented.  - Suggest recheck BMP at follow up.   Acute anemia, unclear etiology. Anemia panel in past wnl. Unmasked by rehydration.  - Normocytic, no bleeding. Consider anemia work up as outpatient.   Thrombocytopenia: Chronic, stable  Essential hypertension - Ok to restart home medications at discharge.  Abnormal TSH: TSH 7.715 but free T4 0.77.  Likely sick euthyroid. - Recommend repeating in 4 to 6 weeks.  Right ankle pain s/p fall: Right foot x-ray without acute abnormality.  - Supportive care.  History of CVA with residual left hemiparesis - Continue plavix, statin  Asthma: Not in acute exacerbation.  Prolonged QTC:  EKG on 11/17 with QTC of 503 ms. - Continue telemetry monitoring. - Avoid QT prolonging meds.  Discharge Instructions Discharge Instructions     Diet general   Complete by: As directed    Discharge instructions   Complete by: As directed    Continue taking tamiflu 30mg  twice daily for 5 more  doses to complete treatment for the flu.   Continue eating and drinking normally to avoid dehydration. You no longer require IV fluids as your kidneys have returned to baseline. You may restart your blood  pressure medications tomorrow. To avoid kidney damage right now, take tylenol and/or tramadol if you have been taking that at home, for pain - not ibuprofen.   Follow up with Dr. Mikeal Hawthorne in the next week for recheck. You have been without fever and do not require continue isolation for the flu.      Allergies as of 01/31/2021       Reactions   Banana Other (See Comments)   Welts on skin and sores in mouth   Penicillins Other (See Comments)   Blister Has patient had a PCN reaction causing immediate rash, facial/tongue/throat swelling, SOB or lightheadedness with hypotension: no Has patient had a PCN reaction causing severe rash involving mucus membranes or skin necrosis: yes - blisters Has patient had a PCN reaction that required hospitalization no Has patient had a PCN reaction occurring within the last 10 years: no If all of the above answers are "NO", then may proceed with Cephalosporin use.   Simbrinza [brinzolamide-brimonidine] Swelling   Eyes swell shut        Medication List     STOP taking these medications    ibuprofen 800 MG tablet Commonly known as: ADVIL       TAKE these medications    acetaminophen 325 MG tablet Commonly known as: TYLENOL Take 2 tablets (650 mg total) by mouth every 6 (six) hours as needed for mild pain, headache, fever or moderate pain.   albuterol 108 (90 Base) MCG/ACT inhaler Commonly known as: VENTOLIN HFA Inhale 2 puffs into the lungs every 8 (eight) hours as needed for wheezing or shortness of breath.   atorvastatin 20 MG tablet Commonly known as: LIPITOR Take 20 mg by mouth daily.   clopidogrel 75 MG tablet Commonly known as: PLAVIX Take 1 tablet (75 mg total) by mouth daily.   fluticasone 50 MCG/ACT nasal spray Commonly known as: FLONASE Place 1 spray into the nose daily as needed for allergies.   gabapentin 300 MG capsule Commonly known as: NEURONTIN Take 1 capsule (300 mg total) by mouth 3 (three) times daily.    hydrochlorothiazide 25 MG tablet Commonly known as: HYDRODIURIL Take 25 mg by mouth daily.   lisinopril 20 MG tablet Commonly known as: ZESTRIL Take 20 mg daily by mouth.   oseltamivir 30 MG capsule Commonly known as: TAMIFLU Take 1 capsule (30 mg total) by mouth 2 (two) times daily.   rOPINIRole 0.25 MG tablet Commonly known as: REQUIP Take 1 tablet (0.25 mg total) by mouth 3 (three) times daily. What changed: when to take this   senna-docusate 8.6-50 MG tablet Commonly known as: Senokot-S Take 1 tablet by mouth 2 (two) times daily. What changed:  when to take this reasons to take this   sertraline 50 MG tablet Commonly known as: ZOLOFT Take 1 tablet (50 mg total) by mouth daily. What changed: when to take this   thiamine 100 MG tablet Commonly known as: Vitamin B-1 Take 100 mg by mouth daily.   timolol 0.5 % ophthalmic solution Commonly known as: TIMOPTIC 1 drop every morning.   traMADol 50 MG tablet Commonly known as: ULTRAM Take 50 mg by mouth every 6 (six) hours as needed for moderate pain.   Travoprost (BAK Free) 0.004 % Soln ophthalmic solution Commonly known as: TRAVATAN Place  1 drop into both eyes at bedtime.   traZODone 100 MG tablet Commonly known as: DESYREL Take 100 mg by mouth at bedtime. What changed: Another medication with the same name was removed. Continue taking this medication, and follow the directions you see here.   zolpidem 5 MG tablet Commonly known as: AMBIEN Take 5 mg by mouth at bedtime as needed for sleep.        Follow-up Information     Rometta Emery, MD. Schedule an appointment as soon as possible for a visit in 1 week(s).   Specialty: Internal Medicine Contact information: [address] [place] [address] [phone]                Allergies  Allergen Reactions   Banana Other (See Comments)    Welts on skin and sores in mouth   Penicillins Other (See Comments)    Blister Has patient had  a PCN reaction causing immediate rash, facial/tongue/throat swelling, SOB or lightheadedness with hypotension: no Has patient had a PCN reaction causing severe rash involving mucus membranes or skin necrosis: yes - blisters Has patient had a PCN reaction that required hospitalization no Has patient had a PCN reaction occurring within the last 10 years: no If all of the above answers are "NO", then may proceed with Cephalosporin use.    Simbrinza [Brinzolamide-Brimonidine] Swelling    Eyes swell shut    Consultations: None  Procedures/Studies: CT Head Wo Contrast  Result Date: 01/28/2021 CLINICAL DATA:  Syncope.  Delirium. EXAM: CT HEAD WITHOUT CONTRAST TECHNIQUE: Contiguous axial images were obtained from the base of the skull through the vertex without intravenous contrast. COMPARISON:  09/26/2015 FINDINGS: Brain: Periventricular low-density on the right could reflect lacunar infarct or chronic small vessel disease. This appears chronic. No acute intracranial abnormality. Specifically, no hemorrhage, hydrocephalus, mass lesion, acute infarction, or significant intracranial injury. Vascular: No hyperdense vessel or unexpected calcification. Skull: No acute calvarial abnormality. Sinuses/Orbits: Mucosal thickening in the left maxillary sinus and ethmoid air cells. No air-fluid levels. Other: None IMPRESSION: Chronic small vessel disease versus chronic right periventricular lacunar infarct. No acute intracranial abnormality. Chronic sinusitis. Electronically Signed   By: Charlett Nose M.D.   On: 01/28/2021 18:23   CT Cervical Spine Wo Contrast  Result Date: 01/28/2021 CLINICAL DATA:  Syncope.  Neck trauma (Age >= 65y) EXAM: CT CERVICAL SPINE WITHOUT CONTRAST TECHNIQUE: Multidetector CT imaging of the cervical spine was performed without intravenous contrast. Multiplanar CT image reconstructions were also generated. COMPARISON:  None. FINDINGS: Alignment: Normal. Skull base and vertebrae: No acute  fracture. No primary bone lesion or focal pathologic process. Soft tissues and spinal canal: No prevertebral fluid or swelling. No visible canal hematoma. Disc levels: Slight disc space narrowing and early spurring at C5-6. Upper chest: No acute findings Other: None IMPRESSION: No acute bony abnormality. Electronically Signed   By: Charlett Nose M.D.   On: 01/28/2021 18:24   DG Chest Port 1 View  Result Date: 01/28/2021 CLINICAL DATA:  Questionable sepsis EXAM: PORTABLE CHEST 1 VIEW COMPARISON:  12/12/2015 FINDINGS: Heart and mediastinal contours are within normal limits. No focal opacities or effusions. No acute bony abnormality. IMPRESSION: No active disease. Electronically Signed   By: Charlett Nose M.D.   On: 01/28/2021 18:42   DG Foot 2 Views Right  Result Date: 01/28/2021 CLINICAL DATA:  Right foot pain. EXAM: RIGHT FOOT - 2 VIEW COMPARISON:  Right foot radiograph dated 03/06/2014. FINDINGS: There is no acute  fracture or dislocation. The bones are osteopenic. No significant arthritic changes. The soft tissues are grossly unremarkable. IMPRESSION: No acute fracture or dislocation. Electronically Signed   By: Elgie Collard M.D.   On: 01/28/2021 20:49   ECHOCARDIOGRAM COMPLETE  Result Date: 01/29/2021    ECHOCARDIOGRAM REPORT   Patient Name:   [name] Date of Exam: 01/29/2021 Medical Rec #:  [id number]    Height:       63.0 in Accession #:    [id number]   Weight:       137.0 lb Date of Birth:  November 24, 1955    BSA:          1.646 m Patient Age:    65 years     BP:           101/62 mmHg Patient Gender: F            HR:           61 bpm. Exam Location:  Inpatient Procedure: 2D Echo Indications:    syncope  History:        Patient has prior history of Echocardiogram examinations, most                 recent 09/27/2015.  Sonographer:    Delcie Roch RDCS Referring Phys: [id number] ANAND D HONGALGI IMPRESSIONS  1. Left ventricular ejection fraction, by estimation, is 60 to 65%. The left ventricle has  normal function. The left ventricle has no regional wall motion abnormalities. Left ventricular diastolic parameters were normal.  2. Right ventricular systolic function is normal. The right ventricular size is normal. There is mildly elevated pulmonary artery systolic pressure.  3. The mitral valve is normal in structure. No evidence of mitral valve regurgitation. No evidence of mitral stenosis.  4. The aortic valve is tricuspid. Aortic valve regurgitation is not visualized. No aortic stenosis is present.  5. The inferior vena cava is normal in size with greater than 50% respiratory variability, suggesting right atrial pressure of 3 mmHg. FINDINGS  Left Ventricle: Left ventricular ejection fraction, by estimation, is 60 to 65%. The left ventricle has normal function. The left ventricle has no regional wall motion abnormalities. The left ventricular internal cavity size was normal in size. There is  no left ventricular hypertrophy. Left ventricular diastolic parameters were normal. Indeterminate filling pressures. Right Ventricle: The right ventricular size is normal. No increase in right ventricular wall thickness. Right ventricular systolic function is normal. There is mildly elevated pulmonary artery systolic pressure. The tricuspid regurgitant velocity is 2.89  m/s, and with an assumed right atrial pressure of 3 mmHg, the estimated right ventricular systolic pressure is 36.4 mmHg. Left Atrium: Left atrial size was normal in size. Right Atrium: Right atrial size was normal in size. Pericardium: There is no evidence of pericardial effusion. Mitral Valve: The mitral valve is normal in structure. No evidence of mitral valve regurgitation. No evidence of mitral valve stenosis. Tricuspid Valve: The tricuspid valve is normal in structure. Tricuspid valve regurgitation is trivial. No evidence of tricuspid stenosis. Aortic Valve: The aortic valve is tricuspid. Aortic valve regurgitation is not visualized. No aortic  stenosis is present. Pulmonic Valve: The pulmonic valve was normal in structure. Pulmonic valve regurgitation is not visualized. No evidence of pulmonic stenosis. Aorta: The aortic root is normal in size and structure. Venous: The inferior vena cava is normal in size with greater than 50% respiratory variability, suggesting right atrial pressure of 3 mmHg. IAS/Shunts: No atrial level shunt detected  by color flow Doppler.  LEFT VENTRICLE PLAX 2D LVIDd:         4.00 cm   Diastology LVIDs:         2.30 cm   LV e' medial:    7.94 cm/s LV PW:         0.80 cm   LV E/e' medial:  12.4 LV IVS:        0.70 cm   LV e' lateral:   12.40 cm/s LVOT diam:     1.60 cm   LV E/e' lateral: 7.9 LV SV:         47 LV SV Index:   28 LVOT Area:     2.01 cm  RIGHT VENTRICLE             IVC RV S prime:     12.70 cm/s  IVC diam: 1.40 cm TAPSE (M-mode): 2.1 cm LEFT ATRIUM             Index        RIGHT ATRIUM          Index LA diam:        3.00 cm 1.82 cm/m   RA Area:     9.55 cm LA Vol (A2C):   31.4 ml 19.07 ml/m  RA Volume:   18.90 ml 11.48 ml/m LA Vol (A4C):   27.1 ml 16.46 ml/m LA Biplane Vol: 29.2 ml 17.74 ml/m  AORTIC VALVE LVOT Vmax:   97.90 cm/s LVOT Vmean:  61.400 cm/s LVOT VTI:    0.232 m  AORTA Ao Root diam: 2.50 cm Ao Asc diam:  3.00 cm MITRAL VALVE               TRICUSPID VALVE MV Area (PHT): 3.51 cm    TR Peak grad:   33.4 mmHg MV Decel Time: 216 msec    TR Vmax:        289.00 cm/s MV E velocity: 98.10 cm/s MV A velocity: 74.40 cm/s  SHUNTS MV E/A ratio:  1.32        Systemic VTI:  0.23 m                            Systemic Diam: 1.60 cm Chilton Si MD Electronically signed by Chilton Si MD Signature Date/Time: 01/29/2021/7:15:53 PM    Final      Subjective: Feels well, ambulating at baseline, ate a full breakfast without nausea or vomiting. No orthostatic symptoms, chest pain or dyspnea.   Discharge Exam: Vitals:   01/31/21 0500 01/31/21 0733  BP: 126/76 122/79  Pulse: (!) 56 64  Resp: 15 19  Temp:  98 F (36.7 C) 98.2 F (36.8 C)  SpO2: 98% 97%   General: Pt is alert, awake, not in acute distress Cardiovascular: RRR, S1/S2 +, no rubs, no gallops Respiratory: CTA bilaterally, no wheezing, no rhonchi Abdominal: Soft, NT, ND, bowel sounds + Extremities: No edema, no cyanosis  Labs: BNP (last 3 results) No results for input(s): BNP in the last 8760 hours. Basic Metabolic Panel: Recent Labs  Lab 01/28/21 1617 01/28/21 1620 01/28/21 1622 01/28/21 2204 01/29/21 0615 01/30/21 0057 01/31/21 0054  NA 136 138 138  --  140 142 145  K 2.9* 4.4 4.4  --  3.0* 3.7 3.4*  CL 99 104  --   --  111 112* 111  CO2 24  --   --   --  23 23 23  GLUCOSE 110* 99  --   --  76 81 98  BUN 18 25*  --   --  13 7* 5*  CREATININE 2.17* 2.20*  --   --  1.07* 0.79 0.74  CALCIUM 9.4  --   --   --  8.6* 9.0 9.1  MG  --   --   --  1.8  --   --   --   PHOS  --   --   --   --   --   --  3.4   Liver Function Tests: Recent Labs  Lab 01/28/21 1617 01/31/21 0054  AST 30  --   ALT 19  --   ALKPHOS 59  --   BILITOT 0.9  --   PROT 7.6  --   ALBUMIN 4.2 3.2*   No results for input(s): LIPASE, AMYLASE in the last 168 hours. Recent Labs  Lab 01/28/21 1914  AMMONIA 18   CBC: Recent Labs  Lab 01/28/21 1617 01/28/21 1620 01/28/21 1622 01/29/21 0615 01/30/21 0057  WBC 8.7  --   --  6.3 5.9  NEUTROABS 6.6  --   --   --   --   HGB 13.1 15.3* 14.3 10.5* 11.0*  HCT 41.2 45.0 42.0 32.6* 33.6*  MCV 93.6  --   --  92.9 91.3  PLT 144*  --   --  121* 123*   Cardiac Enzymes: No results for input(s): CKTOTAL, CKMB, CKMBINDEX, TROPONINI in the last 168 hours. BNP: Invalid input(s): POCBNP CBG: Recent Labs  Lab 01/28/21 1818 01/29/21 0608 01/30/21 0647 01/30/21 1507 01/31/21 0627  GLUCAP 89 73 80 72 82   D-Dimer No results for input(s): DDIMER in the last 72 hours. Hgb A1c No results for input(s): HGBA1C in the last 72 hours. Lipid Profile No results for input(s): CHOL, HDL, LDLCALC, TRIG,  CHOLHDL, LDLDIRECT in the last 72 hours. Thyroid function studies Recent Labs    01/28/21 1658  TSH 7.715*   Anemia work up No results for input(s): VITAMINB12, FOLATE, FERRITIN, TIBC, IRON, RETICCTPCT in the last 72 hours. Urinalysis    Component Value Date/Time   COLORURINE YELLOW 01/28/2021 1617   APPEARANCEUR HAZY (A) 01/28/2021 1617   APPEARANCEUR Clear 12/10/2015 1023   LABSPEC 1.012 01/28/2021 1617   PHURINE 6.0 01/28/2021 1617   GLUCOSEU NEGATIVE 01/28/2021 1617   HGBUR SMALL (A) 01/28/2021 1617   BILIRUBINUR NEGATIVE 01/28/2021 1617   BILIRUBINUR Negative 12/10/2015 1023   KETONESUR NEGATIVE 01/28/2021 1617   PROTEINUR 30 (A) 01/28/2021 1617   UROBILINOGEN 0.2 05/22/2014 0451   NITRITE NEGATIVE 01/28/2021 1617   LEUKOCYTESUR NEGATIVE 01/28/2021 1617    Microbiology Recent Results (from the past 240 hour(s))  Urine Culture     Status: Abnormal   Collection Time: 01/28/21  4:17 PM   Specimen: In/Out Cath Urine  Result Value Ref Range Status   Specimen Description IN/OUT CATH URINE  Final   Special Requests   Final    NONE Performed at [facility], [address] [place], [place] [address]    Culture MULTIPLE SPECIES PRESENT, SUGGEST RECOLLECTION (A)  Final   Report Status 01/30/2021 FINAL  Final  Resp Panel by RT-PCR (Flu A&B, Covid) Nasopharyngeal Swab     Status: Abnormal   Collection Time: 01/28/21  4:20 PM   Specimen: Nasopharyngeal Swab; Nasopharyngeal(NP) swabs in vial transport medium  Result Value Ref Range Status   SARS Coronavirus 2 by RT PCR NEGATIVE NEGATIVE  Final    Comment: (NOTE) SARS-CoV-2 target nucleic acids are NOT DETECTED.  The SARS-CoV-2 RNA is generally detectable in upper respiratory specimens during the acute phase of infection. The lowest concentration of SARS-CoV-2 viral copies this assay can detect is 138 copies/mL. A negative result does not preclude SARS-Cov-2 infection and should not be used as the sole basis for  treatment or other patient management decisions. A negative result may occur with  improper specimen collection/handling, submission of specimen other than nasopharyngeal swab, presence of viral mutation(s) within the areas targeted by this assay, and inadequate number of viral copies(<138 copies/mL). A negative result must be combined with clinical observations, patient history, and epidemiological information. The expected result is Negative.  Fact Sheet for Patients:  BloggerCourse.com  Fact Sheet for Healthcare Providers:  SeriousBroker.it  This test is no t yet approved or cleared by the [place] FDA and  has been authorized for detection and/or diagnosis of SARS-CoV-2 by FDA under an Emergency Use Authorization (EUA). This EUA will remain  in effect (meaning this test can be used) for the duration of the COVID-19 declaration under Section 564(b)(1) of the Act, 21 U.S.C.section 360bbb-3(b)(1), unless the authorization is terminated  or revoked sooner.       Influenza A by PCR POSITIVE (A) NEGATIVE Final   Influenza B by PCR NEGATIVE NEGATIVE Final    Comment: (NOTE) The Xpert Xpress SARS-CoV-2/FLU/RSV plus assay is intended as an aid in the diagnosis of influenza from Nasopharyngeal swab specimens and should not be used as a sole basis for treatment. Nasal washings and aspirates are unacceptable for Xpert Xpress SARS-CoV-2/FLU/RSV testing.  Fact Sheet for Patients: BloggerCourse.com  Fact Sheet for Healthcare Providers: SeriousBroker.it  This test is not yet approved or cleared by the [place] FDA and has been authorized for detection and/or diagnosis of SARS-CoV-2 by FDA under an Emergency Use Authorization (EUA). This EUA will remain in effect (meaning this test can be used) for the duration of the COVID-19 declaration under Section 564(b)(1) of the Act, 21  U.S.C. section 360bbb-3(b)(1), unless the authorization is terminated or revoked.  Performed at [facility], [address] [place], [place] [address]   Blood Culture (routine x 2)     Status: None (Preliminary result)   Collection Time: 01/28/21  4:42 PM   Specimen: BLOOD  Result Value Ref Range Status   Specimen Description BLOOD LEFT ANTECUBITAL  Final   Special Requests   Final    BOTTLES DRAWN AEROBIC AND ANAEROBIC Blood Culture adequate volume   Culture   Final    NO GROWTH 2 DAYS Performed at [facility], [address] [place], [place] [address]    Report Status PENDING  Incomplete  Blood Culture (routine x 2)     Status: None (Preliminary result)   Collection Time: 01/28/21 10:04 PM   Specimen: BLOOD  Result Value Ref Range Status   Specimen Description BLOOD SITE NOT SPECIFIED  Final   Special Requests   Final    BOTTLES DRAWN AEROBIC AND ANAEROBIC Blood Culture results may not be optimal due to an inadequate volume of blood received in culture bottles   Culture   Final    NO GROWTH 2 DAYS Performed at [facility], [address], [place], [place] [address]    Report Status PENDING  Incomplete  MRSA Next Gen by PCR, Nasal     Status: None   Collection Time: 01/29/21  1:50 PM  Specimen: Nasal Mucosa; Nasal Swab  Result Value Ref Range Status   MRSA by PCR Next Gen NOT DETECTED NOT DETECTED Final    Comment: (NOTE) The GeneXpert MRSA Assay (FDA approved for NASAL specimens only), is one component of a comprehensive MRSA colonization surveillance program. It is not intended to diagnose MRSA infection nor to guide or monitor treatment for MRSA infections. Test performance is not FDA approved in patients less than 72 years old. Performed at [facility], [address], [place], [place] [address]     Time coordinating discharge: Approximately 40 minutes  Tyrone Nine, MD  [facility] 01/31/2021, 8:13 AM

## 2021-01-31 NOTE — Plan of Care (Signed)
  Problem: Education: Goal: Knowledge of General Education information will improve Description: Including pain rating scale, medication(s)/side effects and non-pharmacologic comfort measures Outcome: Completed/Met   Problem: Health Behavior/Discharge Planning: Goal: Ability to manage health-related needs will improve Outcome: Completed/Met   Problem: Clinical Measurements: Goal: Ability to maintain clinical measurements within normal limits will improve Outcome: Completed/Met Goal: Will remain free from infection Outcome: Completed/Met Goal: Diagnostic test results will improve Outcome: Completed/Met Goal: Respiratory complications will improve Outcome: Completed/Met Goal: Cardiovascular complication will be avoided Outcome: Completed/Met   Problem: Activity: Goal: Risk for activity intolerance will decrease Outcome: Completed/Met   Problem: Nutrition: Goal: Adequate nutrition will be maintained Outcome: Completed/Met   Problem: Coping: Goal: Level of anxiety will decrease Outcome: Completed/Met   Problem: Elimination: Goal: Will not experience complications related to bowel motility Outcome: Completed/Met Goal: Will not experience complications related to urinary retention Outcome: Completed/Met   Problem: Pain Managment: Goal: General experience of comfort will improve Outcome: Completed/Met   Problem: Safety: Goal: Ability to remain free from injury will improve Outcome: Completed/Met   Problem: Skin Integrity: Goal: Risk for impaired skin integrity will decrease Outcome: Completed/Met   Problem: Acute Rehab PT Goals(only PT should resolve) Goal: Pt Will Go Supine/Side To Sit Outcome: Completed/Met Goal: Pt Will Go Sit To Supine/Side Outcome: Completed/Met Goal: Patient Will Transfer Sit To/From Stand Outcome: Completed/Met Goal: Pt Will Transfer Bed To Chair/Chair To Bed Outcome: Completed/Met Goal: Pt Will Ambulate Outcome: Completed/Met Goal: Pt  Will Go Up/Down Stairs Outcome: Completed/Met

## 2021-01-31 NOTE — Progress Notes (Signed)
Pt got discharged to home, discharge instructions provided and patient showed understanding to it, IV taken out,Telemonitor DC,pt left unit in wheelchair with all of the belongings accompanied with a family member (Niece)  Lonia Farber, Charity fundraiser

## 2021-02-02 LAB — CULTURE, BLOOD (ROUTINE X 2)
Culture: NO GROWTH
Culture: NO GROWTH
Special Requests: ADEQUATE

## 2021-02-23 ENCOUNTER — Other Ambulatory Visit (HOSPITAL_COMMUNITY): Payer: Self-pay | Admitting: Internal Medicine

## 2021-02-23 ENCOUNTER — Other Ambulatory Visit: Payer: Self-pay | Admitting: *Deleted

## 2021-02-23 ENCOUNTER — Other Ambulatory Visit: Payer: Self-pay | Admitting: Internal Medicine

## 2021-02-23 DIAGNOSIS — K838 Other specified diseases of biliary tract: Secondary | ICD-10-CM

## 2021-03-03 ENCOUNTER — Ambulatory Visit (HOSPITAL_COMMUNITY): Admission: RE | Admit: 2021-03-03 | Payer: Medicare Other | Source: Ambulatory Visit

## 2021-03-03 ENCOUNTER — Encounter (HOSPITAL_COMMUNITY): Payer: Self-pay

## 2021-04-19 ENCOUNTER — Ambulatory Visit: Payer: Medicare Other | Admitting: Podiatry

## 2021-04-20 ENCOUNTER — Other Ambulatory Visit (HOSPITAL_COMMUNITY): Payer: Self-pay | Admitting: Internal Medicine

## 2021-04-20 DIAGNOSIS — R1319 Other dysphagia: Secondary | ICD-10-CM

## 2021-05-19 ENCOUNTER — Other Ambulatory Visit: Payer: Self-pay

## 2021-05-19 ENCOUNTER — Encounter: Payer: Self-pay | Admitting: Podiatry

## 2021-05-19 ENCOUNTER — Ambulatory Visit (INDEPENDENT_AMBULATORY_CARE_PROVIDER_SITE_OTHER): Payer: Medicare Other | Admitting: Podiatry

## 2021-05-19 DIAGNOSIS — L608 Other nail disorders: Secondary | ICD-10-CM | POA: Diagnosis not present

## 2021-05-19 DIAGNOSIS — M79675 Pain in left toe(s): Secondary | ICD-10-CM | POA: Diagnosis not present

## 2021-05-19 DIAGNOSIS — M79674 Pain in right toe(s): Secondary | ICD-10-CM

## 2021-05-19 DIAGNOSIS — D689 Coagulation defect, unspecified: Secondary | ICD-10-CM | POA: Diagnosis not present

## 2021-05-19 DIAGNOSIS — B351 Tinea unguium: Secondary | ICD-10-CM

## 2021-05-19 NOTE — Progress Notes (Signed)
This patient returns to my office for at risk foot care.  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  Patient is taking plavix.  This patient is unable to cut nails herself since the patient cannot reach her  nails.These nails are painful walking and wearing shoes.  This patient presents for at risk foot care today.   ? ?General Appearance  Alert, conversant and in no acute stress. ? ?Vascular  Dorsalis pedis and posterior tibial  pulses are palpable  bilaterally.  Capillary return is within normal limits  bilaterally. Temperature is within normal limits  bilaterally. ? ?Neurologic  Senn-Weinstein monofilament wire test within normal limits right foot.  LOPS diminished left foot.. Muscle power within normal limits bilaterally. ? ?Nails Thick disfigured discolored nails with subungual debris  from hallux to fifth toes bilaterally. No evidence of bacterial infection or drainage bilaterally. ? ?Orthopedic  No limitations of motion  feet .  No crepitus or effusions noted.  No bony pathology or digital deformities noted.HAV  B/L.   ? ?Skin  normotropic skin with no porokeratosis noted bilaterally.  No signs of infections or ulcers noted.    ? ?Onychomycosis  Pain in right toes  Pain in left toes  Clavi second right ? ?Consent was obtained for treatment procedures.   Mechanical debridement of nails 1-5  bilaterally performed with a nail nipper.  Filed with dremel without incident. ? ? ?Return office visit   12 weeks                   Told patient to return for periodic foot care and evaluation due to potential at risk complications. ? ? ? ? ? ?Helane Gunther DPM  ?

## 2021-05-31 ENCOUNTER — Ambulatory Visit (HOSPITAL_COMMUNITY)
Admission: RE | Admit: 2021-05-31 | Discharge: 2021-05-31 | Disposition: A | Discharge status: Home / Self Care | Payer: Medicare Other | Source: Ambulatory Visit | Attending: Internal Medicine | Admitting: Internal Medicine

## 2021-05-31 ENCOUNTER — Other Ambulatory Visit: Payer: Self-pay

## 2021-05-31 ENCOUNTER — Encounter (HOSPITAL_COMMUNITY): Payer: Self-pay

## 2021-05-31 DIAGNOSIS — R1319 Other dysphagia: Secondary | ICD-10-CM

## 2021-08-23 ENCOUNTER — Ambulatory Visit (INDEPENDENT_AMBULATORY_CARE_PROVIDER_SITE_OTHER): Payer: Medicare Other | Admitting: Podiatry

## 2021-08-23 ENCOUNTER — Encounter: Payer: Self-pay | Admitting: Podiatry

## 2021-08-23 DIAGNOSIS — D689 Coagulation defect, unspecified: Secondary | ICD-10-CM

## 2021-08-23 DIAGNOSIS — L84 Corns and callosities: Secondary | ICD-10-CM | POA: Diagnosis not present

## 2021-08-23 DIAGNOSIS — M79675 Pain in left toe(s): Secondary | ICD-10-CM | POA: Diagnosis not present

## 2021-08-23 DIAGNOSIS — L608 Other nail disorders: Secondary | ICD-10-CM

## 2021-08-23 DIAGNOSIS — M79674 Pain in right toe(s): Secondary | ICD-10-CM | POA: Diagnosis not present

## 2021-08-23 DIAGNOSIS — B351 Tinea unguium: Secondary | ICD-10-CM | POA: Diagnosis not present

## 2021-08-23 NOTE — Progress Notes (Signed)
This patient returns to my office for at risk foot care.  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  Patient is taking plavix.  This patient is unable to cut nails herself since the patient cannot reach her  nails.These nails are painful walking and wearing shoes.  This patient presents for at risk foot care today.    General Appearance  Alert, conversant and in no acute stress.  Vascular  Dorsalis pedis and posterior tibial  pulses are palpable  bilaterally.  Capillary return is within normal limits  bilaterally. Temperature is within normal limits  bilaterally.  Neurologic  Senn-Weinstein monofilament wire test within normal limits right foot.  LOPS diminished left foot.. Muscle power within normal limits bilaterally.  Nails Thick disfigured discolored nails with subungual debris  from hallux to fifth toes bilaterally. No evidence of bacterial infection or drainage bilaterally.  Orthopedic  No limitations of motion  feet .  No crepitus or effusions noted.  No bony pathology or digital deformities noted.  HAV  B/L.    Skin  normotropic skin with no porokeratosis noted bilaterally.  No signs of infections or ulcers noted.   Callus sub 5th met right foot.  Onychomycosis  Pain in right toes  Pain in left toes  Callus sub 5th right foot.  Consent was obtained for treatment procedures.   Mechanical debridement of nails 1-5  bilaterally performed with a nail nipper.  Filed with dremel without incident. Debride callus with # 15 blade followed by dremel tool usage.   Return office visit   12 weeks                   Told patient to return for periodic foot care and evaluation due to potential at risk complications.      Gardiner Barefoot DPM

## 2021-09-15 ENCOUNTER — Ambulatory Visit (HOSPITAL_COMMUNITY)
Admission: EM | Admit: 2021-09-15 | Discharge: 2021-09-15 | Disposition: A | Discharge status: Home / Self Care | Payer: Medicare Other | Attending: Family Medicine | Admitting: Family Medicine

## 2021-09-15 ENCOUNTER — Encounter (HOSPITAL_COMMUNITY): Payer: Self-pay

## 2021-09-15 DIAGNOSIS — L239 Allergic contact dermatitis, unspecified cause: Secondary | ICD-10-CM

## 2021-09-15 MED ORDER — TRIAMCINOLONE ACETONIDE 0.1 % EX CREA: 1.0000 | TOPICAL_CREAM | Freq: Three times a day (TID) | CUTANEOUS | 0 refills | Status: DC

## 2021-09-15 MED ORDER — MUPIROCIN 2 % EX OINT: 1.0000 | TOPICAL_OINTMENT | Freq: Two times a day (BID) | CUTANEOUS | 0 refills | Status: DC

## 2021-09-15 MED ORDER — TRIAMCINOLONE ACETONIDE 40 MG/ML IJ SUSP
INTRAMUSCULAR | Status: AC
  Filled 2021-09-15: qty 1

## 2021-09-15 MED ORDER — TRIAMCINOLONE ACETONIDE 40 MG/ML IJ SUSP
40.0000 mg | Freq: Once | INTRAMUSCULAR | Status: AC
  Administered 2021-09-15: 40 mg via INTRAMUSCULAR

## 2021-09-15 NOTE — ED Provider Notes (Signed)
[facility]    CSN: [id number] Arrival date & time: 09/15/21  1053      History   Chief Complaint Chief Complaint  Patient presents with   Rash    HPI [name] is a 66 y.o. female.    Rash  Here for a 3-week history of rash on her palms bilaterally.  At first it was more dry and peeling, and then has advanced to a fairly bumpy rash it is pruritic.  She now feels like she has some on her frontal area also.  No shortness of breath and no fever.  Past medical history negative for diabetes.  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   Hypotension 01/28/2021   AKI (acute kidney injury) (HCC) 01/28/2021   Prolonged QT interval 01/28/2021   Influenza A 01/28/2021   Plantar fasciitis of right foot 01/15/2021   Hyperlipidemia 11/03/2016   Former smoker 01/11/2016   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/05/2015   Left hip pain    History of GI bleed    Spastic bladder    Chronic back pain    ETOH abuse    Hypokalemia    PTSD (post-traumatic stress disorder)    Gastroesophageal reflux disease without esophagitis    Acute ischemic stroke (HCC) 09/27/2015   Acute CVA (cerebrovascular accident) (HCC) 09/27/2015   Smoker    Left-sided weakness 09/26/2015   Acute gastric ulcer    GI bleed 05/22/2014   Acute GI bleeding 05/22/2014   Acute blood loss  anemia 05/22/2014   History of stroke 05/22/2014   Mixed hyperlipidemia 05/22/2014   Gastrointestinal hemorrhage with melena    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) 10/09/2012   Flank pain 10/09/2012   Right pontine stroke (HCC) 10/09/2012   ABDOMINAL PAIN OTHER SPECIFIED SITE 05/17/2010   ALCOHOL ABUSE 06/16/2007   COCAINE ABUSE 06/16/2007   DEPRESSION 06/16/2007   Essential hypertension 06/16/2007   CEREBROVASCULAR ACCIDENT 06/16/2007   Asthma 06/16/2007   GERD 06/16/2007   PANCREATITIS 11/09/2006   ESOPHAGITIS, REFLUX 05/23/2006    Past Surgical History:  Procedure Laterality Date   ABDOMINAL HYSTERECTOMY     APPENDECTOMY     COLONOSCOPY WITH PROPOFOL N/A 08/13/2015   Procedure: COLONOSCOPY WITH PROPOFOL;  Surgeon: Rachael Fee, MD;  Location: [facility];  Service: Endoscopy;  Laterality: N/A;   ESOPHAGOGASTRODUODENOSCOPY N/A 05/23/2014   Procedure: ESOPHAGOGASTRODUODENOSCOPY (EGD);  Surgeon: Beverley Fiedler, MD;  Location: [facility];  Service: Endoscopy;  Laterality: N/A;   ESOPHAGOGASTRODUODENOSCOPY (EGD) WITH PROPOFOL N/A 08/13/2015   Procedure: ESOPHAGOGASTRODUODENOSCOPY (EGD) WITH PROPOFOL;  Surgeon: Rachael Fee, MD;  Location: [facility];  Service: Endoscopy;  Laterality: N/A;  ESOPHAGOGASTRODUODENOSCOPY ENDOSCOPY     gall stone removed      OB History   No obstetric history on file.      Home Medications    Prior to Admission medications   Medication Sig Start Date End Date Taking? Authorizing Provider  mupirocin ointment (BACTROBAN) 2 % Apply 1 Application topically 2 (two) times daily. To yellow spot on hand till better 09/15/21  Yes Ornella Coderre, Janace Aris, MD  triamcinolone cream (KENALOG) 0.1 % Apply 1 Application topically 3 (three) times daily. To rash area till better 09/15/21  Yes Sarrinah Gardin, Janace Aris, MD  acetaminophen (TYLENOL) 325 MG tablet Take 2 tablets (650 mg total) by mouth every 6 (six) hours as needed for  mild pain, headache, fever or moderate pain. 01/31/21   Tyrone Nine, MD  albuterol (PROVENTIL HFA;VENTOLIN HFA) 108 (90 BASE) MCG/ACT inhaler Inhale 2 puffs into the lungs every 8 (eight) hours as needed for wheezing or shortness of breath.    [provider]  atorvastatin (LIPITOR) 20 MG tablet Take 20 mg by mouth daily. 03/08/18   [provider]  clopidogrel (PLAVIX) 75 MG tablet Take 1 tablet (75 mg total) by mouth daily. 11/03/16   Nilda Riggs, NP  fluticasone [facility]) 50 MCG/ACT nasal spray Place 1 spray into the nose daily as needed for allergies.  06/04/15   [provider]  gabapentin (NEURONTIN) 300 MG capsule Take 1 capsule (300 mg total) by mouth 3 (three) times daily. 10/26/15   Angiulli, Mcarthur Rossetti, PA-C  hydrochlorothiazide (HYDRODIURIL) 25 MG tablet Take 25 mg by mouth daily. 09/20/18   [provider]  lisinopril (PRINIVIL,ZESTRIL) 20 MG tablet Take 20 mg daily by mouth. 11/15/16   [provider]  oseltamivir (TAMIFLU) 30 MG capsule Take 1 capsule (30 mg total) by mouth 2 (two) times daily. 01/31/21   Tyrone Nine, MD  rOPINIRole (REQUIP) 0.25 MG tablet Take 1 tablet (0.25 mg total) by mouth 3 (three) times daily. Patient taking differently: Take 0.25 mg by mouth every 8 (eight) hours. 12/02/15   Kirsteins, Victorino Sparrow, MD  senna-docusate (SENOKOT-S) 8.6-50 MG tablet Take 1 tablet by mouth 2 (two) times daily. Patient taking differently: Take 1 tablet by mouth daily as needed for mild constipation. 11/05/15   Marcello Fennel, MD  sertraline (ZOLOFT) 50 MG tablet Take 1 tablet (50 mg total) by mouth daily. Patient taking differently: Take 50 mg by mouth at bedtime. 10/26/15   Angiulli, Mcarthur Rossetti, PA-C  thiamine (VITAMIN B-1) 100 MG tablet Take 100 mg by mouth daily. 08/28/18   [provider]  timolol (TIMOPTIC) 0.5 % ophthalmic solution 1 drop every morning. 08/14/20   [provider]  traMADol (ULTRAM) 50 MG tablet Take  50 mg by mouth every 6 (six) hours as needed for moderate pain. 03/21/18   [provider]  Travoprost, BAK Free, (TRAVATAN) 0.004 % SOLN ophthalmic solution Place 1 drop into both eyes at bedtime.     [provider]  traZODone (DESYREL) 100 MG tablet Take 100 mg by mouth at bedtime. 01/07/21   [provider]  zolpidem (AMBIEN) 5 MG tablet Take 5 mg by mouth at bedtime as needed for sleep. 01/07/21   [provider]    Family History Family History  Problem Relation Age of Onset   Stroke Mother    Cancer Sister        type unknown   Diabetes Father     Social History Social  History   Tobacco Use   Smoking status: Every Day    Packs/day: 0.12    Types: Cigarettes    Last attempt to quit: 09/26/2015    Years since quitting: 5.9   Smokeless tobacco: Former  Substance Use Topics   Alcohol use: No    Alcohol/week: 0.0 standard drinks of alcohol   Drug use: No     Allergies   Banana, Penicillins, and Simbrinza [brinzolamide-brimonidine]   Review of Systems Review of Systems  Skin:  Positive for rash.     Physical Exam Triage Vital Signs ED Triage Vitals  Enc Vitals Group     BP 09/15/21 1202 111/69     Pulse Rate 09/15/21 1202 (!) 49     Resp 09/15/21 1202 18     Temp 09/15/21 1202 98 F (36.7 C)     Temp Source 09/15/21 1202 Oral     SpO2 09/15/21 1202 95 %     Weight --      Height --      Head Circumference --      Peak Flow --      Pain Score 09/15/21 1203 8     Pain Loc --      Pain Edu? --      Excl. in [place]? --    No data found.  Updated Vital Signs BP 111/69 (BP Location: Left Arm)   Pulse (!) 49   Temp 98 F (36.7 C) (Oral)   Resp 18   SpO2 95%   Visual Acuity Right Eye Distance:   Left Eye Distance:   Bilateral Distance:    Right Eye Near:   Left Eye Near:    Bilateral Near:     Physical Exam Vitals reviewed.  Constitutional:      General: She is not in acute distress.    Appearance: She is not  ill-appearing, toxic-appearing or diaphoretic.  Cardiovascular:     Rate and Rhythm: Normal rate and regular rhythm.     Heart sounds: No murmur heard. Pulmonary:     Effort: Pulmonary effort is normal.     Breath sounds: Normal breath sounds.  Skin:    Coloration: Skin is not pale.     Comments: There is a bumpy slightly scaly rash on both palms.  It extends about 4 x 5 cm on each palm.  There is no induration.  On her left palm on the thenar eminence there is 1 spot about half a centimeter in diameter that has some yellow fluid in it.  Also has some bumpy rash on her central forehead.  Neurological:     Mental Status: She is oriented to person, place, and time.  Psychiatric:        Behavior: Behavior normal.      UC Treatments / Results  Labs (all labs ordered are listed, but only abnormal results are displayed) Labs Reviewed - No data to display  EKG   Radiology No results found.  Procedures Procedures (including critical care time)  Medications Ordered in UC Medications  triamcinolone acetonide (KENALOG-40) injection 40 mg (has no administration in time range)    Initial Impression / Assessment and Plan / UC Course  I have reviewed the triage vital signs and the nursing notes.  Pertinent labs & imaging results that were available during my care of the patient were reviewed by me and considered in my medical decision making (see chart for details).     We will treat for contact  dermatitis with triamcinolone.  With that slightly pustular area and when I have her apply mupirocin.  She will follow-up with her PCP Final Clinical Impressions(s) / UC Diagnoses   Final diagnoses:  None     Discharge Instructions      You have been given a shot of triamcinolone 40 mg  Triamcinolone ointment on the rash area 3 times daily until it is improved, for up to about 2 weeks  Put mupirocin ointment on the yellow spot 2 times daily until improved.  You can also do a warm  compress on that spot       ED Prescriptions     Medication Sig Dispense Auth. Provider   triamcinolone cream (KENALOG) 0.1 % Apply 1 Application topically 3 (three) times daily. To rash area till better 80 g Barrett Henle, MD   mupirocin ointment (BACTROBAN) 2 % Apply 1 Application topically 2 (two) times daily. To yellow spot on hand till better 22 g Windy Carina Gwenlyn Perking, MD      PDMP not reviewed this encounter.   Barrett Henle, MD 09/15/21 1225

## 2021-09-15 NOTE — ED Triage Notes (Signed)
Pt c/o itchy dry rash to bilateral palms of her hands for over 3 weeks. States now has blisters. States put Vaseline on it.

## 2021-09-15 NOTE — Discharge Instructions (Addendum)
You have been given a shot of triamcinolone 40 mg  Triamcinolone ointment on the rash area 3 times daily until it is improved, for up to about 2 weeks  Put mupirocin ointment on the yellow spot 2 times daily until improved.  You can also do a warm compress on that spot

## 2021-11-26 ENCOUNTER — Ambulatory Visit (INDEPENDENT_AMBULATORY_CARE_PROVIDER_SITE_OTHER): Payer: Medicare Other | Admitting: Podiatry

## 2021-11-26 ENCOUNTER — Encounter: Payer: Self-pay | Admitting: Podiatry

## 2021-11-26 DIAGNOSIS — L84 Corns and callosities: Secondary | ICD-10-CM

## 2021-11-26 DIAGNOSIS — L608 Other nail disorders: Secondary | ICD-10-CM | POA: Diagnosis not present

## 2021-11-26 DIAGNOSIS — M79675 Pain in left toe(s): Secondary | ICD-10-CM | POA: Diagnosis not present

## 2021-11-26 DIAGNOSIS — M79674 Pain in right toe(s): Secondary | ICD-10-CM

## 2021-11-26 DIAGNOSIS — M205X9 Other deformities of toe(s) (acquired), unspecified foot: Secondary | ICD-10-CM | POA: Insufficient documentation

## 2021-11-26 DIAGNOSIS — D689 Coagulation defect, unspecified: Secondary | ICD-10-CM

## 2021-11-26 DIAGNOSIS — B351 Tinea unguium: Secondary | ICD-10-CM

## 2021-11-26 NOTE — Progress Notes (Addendum)
This patient returns to my office for at risk foot care.  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  Patient is taking plavix.  This patient is unable to cut nails herself since the patient cannot reach her  nails.These nails are painful walking and wearing shoes.  This patient presents for at risk foot care today.    General Appearance  Alert, conversant and in no acute stress.  Vascular  Dorsalis pedis and posterior tibial  pulses are palpable  bilaterally.  Capillary return is within normal limits  bilaterally. Temperature is within normal limits  bilaterally.  Neurologic  Senn-Weinstein monofilament wire test within normal limits right foot.  LOPS diminished left foot.. Muscle power within normal limits bilaterally.  Nails Thick disfigured discolored nails with subungual debris  from hallux to fifth toes bilaterally. No evidence of bacterial infection or drainage bilaterally.  Orthopedic  No limitations of motion  feet .  No crepitus or effusions noted.  No bony pathology or digital deformities noted.  HAV  B/L.    Skin  normotropic skin with no porokeratosis noted bilaterally.  No signs of infections or ulcers noted.   Callus sub 5th met right foot.  Onychomycosis  Pain in right toes  Pain in left toes  Callus sub 5th right foot.  Consent was obtained for treatment procedures.   Mechanical debridement of nails 1-5  bilaterally performed with a nail nipper.  Filed with dremel without incident. Debride callus with # 15 blade followed by dremel tool usage.  Her niece requests an appointment for surgical correction of second toe  both feet.   Return office visit   12 weeks                   Told patient to return for periodic foot care and evaluation due to potential at risk complications.      Gregory Mayer DPM  

## 2021-12-23 ENCOUNTER — Ambulatory Visit (INDEPENDENT_AMBULATORY_CARE_PROVIDER_SITE_OTHER): Payer: Medicare Other | Admitting: Podiatry

## 2021-12-23 DIAGNOSIS — D689 Coagulation defect, unspecified: Secondary | ICD-10-CM | POA: Diagnosis not present

## 2021-12-23 DIAGNOSIS — M205X9 Other deformities of toe(s) (acquired), unspecified foot: Secondary | ICD-10-CM | POA: Diagnosis not present

## 2021-12-23 DIAGNOSIS — I739 Peripheral vascular disease, unspecified: Secondary | ICD-10-CM | POA: Diagnosis not present

## 2021-12-23 DIAGNOSIS — L84 Corns and callosities: Secondary | ICD-10-CM

## 2021-12-23 NOTE — Progress Notes (Signed)
  Subjective:  Patient ID: [name], female    DOB: 1956/01/30,  MRN: [id number]  Chief Complaint  Patient presents with   Toe Pain    Room 16  Pt states she is having pain in 2 toe on left foot , this has been going on for months. Causes her to have trouble walking     66 y.o. female presents with the above complaint. History confirmed with patient. Patient presenting related to callus at the left second toe.  She has previously seen Dr. Prudence Davidson.  He referred her to me for discussion about possible correction of the toe deformity.  She is also hoping have the callus shaved at this visit.  This causes her having difficulty with ambulation.  Objective:  Physical Exam: warm, good capillary refill, DP and PT pulses not on palpable bilateral nail exam normal nails without lesions DP pulses non palpable, PT pulses non palpable, and protective sensation intact Left Foot: Mallet toe deformity of the second toe with flexion contracture at the DIPJ.  Hyperkeratotic lesion present distal aspect left second toe Right Foot: Mallet toe deformity right second toe.  Assessment:   1. Pre-ulcerative calluses   2. Mallet toe, acquired, unspecified laterality   3. Blood clotting disorder ([place])      Plan:  Patient was evaluated and treated and all questions answered.  #Hyperkeratotic lesions/pre ulcerative calluses present distal aspect left 2nd toe, mallet toe deformity All symptomatic hyperkeratoses x 1 separate lesions were safely debrided with a sterile #10 blade to patient's level of comfort without incident. We discussed preventative and palliative care of these lesions including supportive and accommodative shoegear, padding, prefabricated and custom molded accommodative orthoses, use of a pumice stone and lotions/creams daily. -Discussed with the patient that surgery could be considered for correction of mallet toe left second toe.  Discussed what surgery involved including arthroplasty or  arthrodesis of the interphalangeal joints of the toe.  Discussed possible risk complications and benefits of this procedure. -Patient is interested in surgical correction at some point time.  #Concern for peripheral arterial disease left lower extremity -Given diminished pulses on the left lower extremity and possible surgical intervention to the left second toe I recommend referral for ABI studies at this time. -Patient will have these done and follow-up in 1 month at which time we will determine if she needs vascular consultation or if there are any abnormalities on the ABI studies if not we can proceed with surgical planning at that point time.  Return in about 4 weeks (around 01/20/2022) for Follow up L 2nd toe callus.         Everitt Amber, DPM [facility]

## 2022-01-20 ENCOUNTER — Ambulatory Visit (INDEPENDENT_AMBULATORY_CARE_PROVIDER_SITE_OTHER): Payer: Self-pay | Admitting: Podiatry

## 2022-01-20 ENCOUNTER — Ambulatory Visit (HOSPITAL_COMMUNITY)
Admission: RE | Admit: 2022-01-20 | Discharge: 2022-01-20 | Disposition: A | Discharge status: Home / Self Care | Payer: Medicare Other | Source: Ambulatory Visit | Attending: Podiatry | Admitting: Podiatry

## 2022-01-20 DIAGNOSIS — Z91199 Patient's noncompliance with other medical treatment and regimen due to unspecified reason: Secondary | ICD-10-CM

## 2022-01-20 DIAGNOSIS — M205X9 Other deformities of toe(s) (acquired), unspecified foot: Secondary | ICD-10-CM | POA: Diagnosis not present

## 2022-01-20 DIAGNOSIS — L84 Corns and callosities: Secondary | ICD-10-CM | POA: Insufficient documentation

## 2022-01-20 NOTE — Progress Notes (Signed)
Pt was a no show for appointment, charge generated 

## 2022-02-17 ENCOUNTER — Ambulatory Visit (INDEPENDENT_AMBULATORY_CARE_PROVIDER_SITE_OTHER): Payer: Medicare Other | Admitting: Podiatry

## 2022-02-17 DIAGNOSIS — D689 Coagulation defect, unspecified: Secondary | ICD-10-CM

## 2022-02-17 DIAGNOSIS — L84 Corns and callosities: Secondary | ICD-10-CM | POA: Diagnosis not present

## 2022-02-17 DIAGNOSIS — I739 Peripheral vascular disease, unspecified: Secondary | ICD-10-CM | POA: Diagnosis not present

## 2022-02-17 DIAGNOSIS — M205X9 Other deformities of toe(s) (acquired), unspecified foot: Secondary | ICD-10-CM

## 2022-02-17 MED ORDER — UREA 10 % EX CREA: TOPICAL_CREAM | CUTANEOUS | 0 refills | Status: DC | PRN

## 2022-02-17 NOTE — Progress Notes (Signed)
  Subjective:  Patient ID: [name], female    DOB: 12-Jan-1956,  MRN: [id number]  Chief Complaint  Patient presents with   Follow-up    Follow up left 2nd toe callus.    66 y.o. female presents with the above complaint. History confirmed with patient. Patient presenting related to callus at the left second toe. Also has been using gel toe caps and lotion. Reports also new areas of callus to right 2nd toe, r 5ht met head area, right heel. Pain with pressure of the lesions. Denies history DM but does have PAD, as well as history clotting disorder.  Objective:  Physical Exam: warm, good capillary refill, DP and PT pulses not on palpable bilateral nail exam normal nails without lesions DP pulses non palpable, PT pulses non palpable, and protective sensation intact Left Foot: Mallet toe deformity of the second toe with flexion contracture at the DIPJ.  Hyperkeratotic lesion present distal aspect left second toe Right Foot: Mallet toe deformity right second toe with hyperkeratotic lesion present without underlying ulcer. R lateral heel callus and right plantar 5th met head hyperkeratotic lesion.    Assessment:   1. Pre-ulcerative calluses   2. Mallet toe, acquired, unspecified laterality   3. PAD (peripheral artery disease) ([place])   4. Blood clotting disorder ([place])       Plan:  Patient was evaluated and treated and all questions answered.  #Hyperkeratotic lesions/pre ulcerative calluses present distal aspect left 2nd toe,  distal aspect right 2nd toe, right plantar 5th met head, right lateral heel All symptomatic hyperkeratoses x 4 lesions were safely debrided with a sterile #15 blade to patient's level of comfort without incident. We discussed preventative and palliative care of these lesions including supportive and accommodative shoegear, padding, prefabricated and custom molded accommodative orthoses, use of a pumice stone and lotions/creams daily. - Will send Rx for urea 10% cream  to the patients pharamcy to apply on callused areas prn for callus buildup.     Return in about 3 months (around 05/19/2022) for Callus / hammertoe.         Everitt Amber, DPM [facility]

## 2022-03-02 ENCOUNTER — Ambulatory Visit: Payer: Medicare Other | Admitting: Podiatry

## 2022-03-03 ENCOUNTER — Emergency Department (HOSPITAL_COMMUNITY): Payer: Medicare Other

## 2022-03-03 ENCOUNTER — Emergency Department (HOSPITAL_COMMUNITY)
Admission: EM | Admit: 2022-03-03 | Discharge: 2022-03-03 | Disposition: A | Discharge status: Home / Self Care | Payer: Medicare Other | Attending: Emergency Medicine | Admitting: Emergency Medicine

## 2022-03-03 ENCOUNTER — Ambulatory Visit (HOSPITAL_COMMUNITY)
Admission: EM | Admit: 2022-03-03 | Discharge: 2022-03-03 | Disposition: A | Discharge status: Home / Self Care | Payer: Medicare Other

## 2022-03-03 ENCOUNTER — Other Ambulatory Visit: Payer: Self-pay

## 2022-03-03 DIAGNOSIS — E876 Hypokalemia: Secondary | ICD-10-CM | POA: Diagnosis not present

## 2022-03-03 DIAGNOSIS — R001 Bradycardia, unspecified: Secondary | ICD-10-CM | POA: Diagnosis not present

## 2022-03-03 DIAGNOSIS — I10 Essential (primary) hypertension: Secondary | ICD-10-CM | POA: Diagnosis not present

## 2022-03-03 DIAGNOSIS — W1830XA Fall on same level, unspecified, initial encounter: Secondary | ICD-10-CM | POA: Insufficient documentation

## 2022-03-03 DIAGNOSIS — Z7902 Long term (current) use of antithrombotics/antiplatelets: Secondary | ICD-10-CM | POA: Insufficient documentation

## 2022-03-03 DIAGNOSIS — S060X9A Concussion with loss of consciousness of unspecified duration, initial encounter: Secondary | ICD-10-CM | POA: Insufficient documentation

## 2022-03-03 DIAGNOSIS — S72111A Displaced fracture of greater trochanter of right femur, initial encounter for closed fracture: Secondary | ICD-10-CM | POA: Diagnosis not present

## 2022-03-03 DIAGNOSIS — S72101A Unspecified trochanteric fracture of right femur, initial encounter for closed fracture: Secondary | ICD-10-CM

## 2022-03-03 DIAGNOSIS — W19XXXA Unspecified fall, initial encounter: Secondary | ICD-10-CM

## 2022-03-03 DIAGNOSIS — R4020 Unspecified coma: Secondary | ICD-10-CM

## 2022-03-03 DIAGNOSIS — M25551 Pain in right hip: Secondary | ICD-10-CM | POA: Diagnosis present

## 2022-03-03 LAB — CBC WITH DIFFERENTIAL/PLATELET
Abs Immature Granulocytes: 0.01 10*3/uL (ref 0.00–0.07)
Basophils Absolute: 0 10*3/uL (ref 0.0–0.1)
Basophils Relative: 1 %
Eosinophils Absolute: 0 10*3/uL (ref 0.0–0.5)
Eosinophils Relative: 1 %
HCT: 37.8 % (ref 36.0–46.0)
Hemoglobin: 12.4 g/dL (ref 12.0–15.0)
Immature Granulocytes: 0 %
Lymphocytes Relative: 35 %
Lymphs Abs: 2 10*3/uL (ref 0.7–4.0)
MCH: 30.5 pg (ref 26.0–34.0)
MCHC: 32.8 g/dL (ref 30.0–36.0)
MCV: 93.1 fL (ref 80.0–100.0)
Monocytes Absolute: 0.6 10*3/uL (ref 0.1–1.0)
Monocytes Relative: 10 %
Neutro Abs: 3 10*3/uL (ref 1.7–7.7)
Neutrophils Relative %: 53 %
Platelets: 169 10*3/uL (ref 150–400)
RBC: 4.06 MIL/uL (ref 3.87–5.11)
RDW: 13.6 % (ref 11.5–15.5)
WBC: 5.7 10*3/uL (ref 4.0–10.5)
nRBC: 0 % (ref 0.0–0.2)

## 2022-03-03 LAB — BASIC METABOLIC PANEL
Anion gap: 7 (ref 5–15)
BUN: 10 mg/dL (ref 8–23)
CO2: 27 mmol/L (ref 22–32)
Calcium: 9.8 mg/dL (ref 8.9–10.3)
Chloride: 107 mmol/L (ref 98–111)
Creatinine, Ser: 0.96 mg/dL (ref 0.44–1.00)
GFR, Estimated: >60 mL/min (ref >60)
Glucose, Bld: 92 mg/dL (ref 70–99)
Potassium: 2.7 mmol/L — CL (ref 3.5–5.1)
Sodium: 141 mmol/L (ref 135–145)

## 2022-03-03 LAB — PROTIME-INR
INR: 1.1 (ref 0.8–1.2)
Prothrombin Time: 14.3 seconds (ref 11.4–15.2)

## 2022-03-03 LAB — MAGNESIUM: Magnesium: 2 mg/dL (ref 1.7–2.4)

## 2022-03-03 MED ORDER — POTASSIUM CHLORIDE CRYS ER 20 MEQ PO TBCR
40.0000 meq | EXTENDED_RELEASE_TABLET | Freq: Once | ORAL | Status: AC
  Administered 2022-03-03: 40 meq via ORAL
  Filled 2022-03-03: qty 2

## 2022-03-03 MED ORDER — POTASSIUM CHLORIDE 10 MEQ/100ML IV SOLN
10.0000 meq | Freq: Once | INTRAVENOUS | Status: AC
  Administered 2022-03-03: 10 meq via INTRAVENOUS
  Filled 2022-03-03: qty 100

## 2022-03-03 MED ORDER — POTASSIUM CHLORIDE CRYS ER 20 MEQ PO TBCR: 20.0000 meq | EXTENDED_RELEASE_TABLET | Freq: Every day | ORAL | 0 refills | Status: DC

## 2022-03-03 MED ORDER — HYALURONIDASE HUMAN 150 UNIT/ML IJ SOLN
150.0000 [IU] | Freq: Once | INTRAMUSCULAR | Status: AC
  Administered 2022-03-03: 150 [IU] via SUBCUTANEOUS
  Filled 2022-03-03: qty 1

## 2022-03-03 NOTE — ED Provider Notes (Signed)
[facility] EMERGENCY DEPARTMENT Provider Note   CSN: [id number] Arrival date & time: 03/03/22  0848     History  Chief Complaint  Patient presents with   Fall   Head Injury    [name] is a 66 y.o. female.  [name] is a 66 y.o. 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  Per patient's niece who is patient's primary caregiver she was straining to have a bowel movement yesterday around 2 PM and had a loss of consciousness causing her to fall from the toilet with her head stuck between the toilet and the wall.  She reports she was ultimately able to get herself up but fell onto her right side and has also complained of some right hip pain.  She initially tried to take her to urgent care this morning to get this checked out but was directed to the emergency department as patient is reported to be on blood thinners.  Medical records include clopidogrel on patient's medication list, daughter stated at 1 point the patient was on Coumadin.  Aside from hip pain patient denies any other pain from fall.  No other aggravating or alleviating factors.  No additional syncopal episodes since using the bathroom, denies any chest pain or shortness of breath.  The history is provided by the patient and a relative.  Fall Pertinent negatives include no chest pain, no abdominal pain, no headaches and no shortness of breath.  Head Injury Associated symptoms: no headaches, no numbness and no seizures        Home Medications Prior to Admission medications   Medication Sig Start Date End Date Taking? Authorizing Provider  acetaminophen (TYLENOL) 325 MG tablet Take 2 tablets (650 mg total) by mouth every 6 (six) hours as needed for mild pain, headache, fever or moderate pain. 01/31/21   Tyrone Nine, MD  albuterol (PROVENTIL HFA;VENTOLIN HFA) 108 (90  BASE) MCG/ACT inhaler Inhale 2 puffs into the lungs every 8 (eight) hours as needed for wheezing or shortness of breath.    [provider]  atorvastatin (LIPITOR) 20 MG tablet Take 20 mg by mouth daily. 03/08/18   [provider]  betamethasone dipropionate 0.05 % cream Apply topically 2 (two) times daily as needed. 12/08/21   [provider]  buPROPion (WELLBUTRIN) 75 MG tablet Take 75 mg by mouth daily. 11/27/21   [provider]  clopidogrel (PLAVIX) 75 MG tablet Take 1 tablet (75 mg total) by mouth daily. 11/03/16   Nilda Riggs, NP  fluticasone [facility]) 50 MCG/ACT nasal spray Place 1 spray into the nose daily as needed for allergies.  06/04/15   [provider]  gabapentin (NEURONTIN) 300 MG capsule Take 1 capsule (300 mg total) by mouth 3 (three) times daily. 10/26/15   Angiulli, Mcarthur Rossetti, PA-C  hydrochlorothiazide (HYDRODIURIL) 25 MG tablet Take 25 mg by mouth daily. 09/20/18   [provider]  lisinopril (PRINIVIL,ZESTRIL) 20 MG tablet Take 20 mg daily by mouth. 11/15/16   [provider]  mupirocin ointment (BACTROBAN) 2 % Apply 1 Application topically 2 (two) times daily. To yellow spot on hand till better 09/15/21   Zenia Resides, MD  omeprazole (PRILOSEC) 40 MG capsule Take 40 mg by mouth daily. 10/03/21   [provider]  oseltamivir (TAMIFLU) 30 MG capsule Take 1 capsule (30 mg total) by mouth 2 (  two) times daily. 01/31/21   Tyrone Nine, MD  predniSONE (STERAPRED UNI-PAK 21 TAB) 10 MG (21) TBPK tablet Take by mouth as directed. 12/18/21   [provider]  rOPINIRole (REQUIP) 0.25 MG tablet Take 1 tablet (0.25 mg total) by mouth 3 (three) times daily. Patient taking differently: Take 0.25 mg by mouth every 8 (eight) hours. 12/02/15   Kirsteins, Victorino Sparrow, MD  senna-docusate (SENOKOT-S) 8.6-50 MG tablet Take 1 tablet by mouth 2 (two) times daily. Patient taking differently: Take 1 tablet by mouth daily as  needed for mild constipation. 11/05/15   Marcello Fennel, MD  sertraline (ZOLOFT) 50 MG tablet Take 1 tablet (50 mg total) by mouth daily. Patient taking differently: Take 50 mg by mouth at bedtime. 10/26/15   Angiulli, Mcarthur Rossetti, PA-C  thiamine (VITAMIN B-1) 100 MG tablet Take 100 mg by mouth daily. 08/28/18   [provider]  timolol (TIMOPTIC) 0.5 % ophthalmic solution 1 drop every morning. 08/14/20   [provider]  traMADol (ULTRAM) 50 MG tablet Take 50 mg by mouth every 6 (six) hours as needed for moderate pain. 03/21/18   [provider]  Travoprost, BAK Free, (TRAVATAN) 0.004 % SOLN ophthalmic solution Place 1 drop into both eyes at bedtime.     [provider]  traZODone (DESYREL) 100 MG tablet Take 100 mg by mouth at bedtime. 01/07/21   [provider]  triamcinolone cream (KENALOG) 0.1 % Apply 1 Application topically 3 (three) times daily. To rash area till better 09/15/21   Zenia Resides, MD  urea (CARMOL) 10 % cream Apply topically as needed. 02/17/22   Standiford, Jenelle Mages, DPM  zolpidem (AMBIEN) 5 MG tablet Take 5 mg by mouth at bedtime as needed for sleep. 01/07/21   [provider]      Allergies    Banana, Penicillins, and Simbrinza [brinzolamide-brimonidine]    Review of Systems   Review of Systems  Constitutional:  Negative for chills and fever.  HENT: Negative.    Respiratory:  Negative for cough and shortness of breath.   Cardiovascular:  Negative for chest pain.  Gastrointestinal:  Negative for abdominal pain.  Genitourinary:  Negative for dysuria.  Musculoskeletal:  Positive for arthralgias.  Neurological:  Positive for syncope. Negative for dizziness, seizures, weakness, numbness and headaches.    Physical Exam Updated Vital Signs BP 105/60 (BP Location: Right Arm)   Pulse (!) 58   Temp 98.4 F (36.9 C) (Oral)   Resp 16   SpO2 97%  Physical Exam Vitals and nursing note reviewed.  Constitutional:       General: She is not in acute distress.    Appearance: Normal appearance. She is well-developed. She is not diaphoretic.  HENT:     Head: Normocephalic and atraumatic.     Comments: No hematoma, step-off or deformity    Mouth/Throat:     Mouth: Mucous membranes are moist.     Pharynx: Oropharynx is clear.  Eyes:     General:        Right eye: No discharge.        Left eye: No discharge.     Pupils: Pupils are equal, round, and reactive to light.  Neck:     Comments: No midline C-spine tenderness, normal range of motion Cardiovascular:     Rate and Rhythm: Normal rate and regular rhythm.     Pulses: Normal pulses.     Heart sounds: Normal heart sounds.  Pulmonary:  Effort: Pulmonary effort is normal. No respiratory distress.     Breath sounds: Normal breath sounds. No wheezing or rales.     Comments: Respirations equal and unlabored, patient able to speak in full sentences, lungs clear to auscultation bilaterally  Chest:     Chest wall: No tenderness.  Abdominal:     General: Bowel sounds are normal. There is no distension.     Palpations: Abdomen is soft. There is no mass.     Tenderness: There is no abdominal tenderness. There is no guarding.     Comments: Abdomen soft, nondistended, nontender to palpation in all quadrants without guarding or peritoneal signs  Musculoskeletal:        General: Tenderness present. No deformity.     Cervical back: Neck supple.     Comments: Tenderness over the lateral right hip without overlying skin changes or palpable deformity, no shortening or rotation of the right lower extremity, all other joints supple and easily movable, all compartments soft, distal pulses 2+ in bilateral lower extremities  Skin:    General: Skin is warm and dry.     Capillary Refill: Capillary refill takes less than 2 seconds.  Neurological:     Mental Status: She is alert and oriented to person, place, and time.     Coordination: Coordination normal.     Comments:  Speech is clear, able to follow commands CN III-XII intact Normal strength in upper and lower extremities bilaterally including dorsiflexion and plantar flexion, strong and equal grip strength Sensation normal to light and sharp touch Moves extremities without ataxia, coordination intact  Psychiatric:        Mood and Affect: Mood normal.        Behavior: Behavior normal.     ED Results / Procedures / Treatments   Labs (all labs ordered are listed, but only abnormal results are displayed) Labs Reviewed  BASIC METABOLIC PANEL - Abnormal; Notable for the following components:      Result Value   Potassium 2.7 (*)    All other components within normal limits  CBC WITH DIFFERENTIAL/PLATELET  MAGNESIUM    EKG EKG Interpretation  Date/Time:  Thursday March 03 2022 08:56:53 EST Ventricular Rate:  56 PR Interval:  148 QRS Duration: 78 QT Interval:  444 QTC Calculation: 428 R Axis:   18 Text Interpretation: Sinus bradycardia  nonspecific T waves, no significant change with 2022 Confirmed by Pricilla LovelessGoldston, Scott [phone]) on 03/03/2022 12:00:46 PM  Radiology DG Hip Unilat W or Wo Pelvis 2-3 Views Right  Result Date: 03/03/2022 CLINICAL DATA:  Right hip pain status post fall. EXAM: DG HIP (WITH OR WITHOUT PELVIS) 2-3V RIGHT COMPARISON:  None Available. FINDINGS: There is no evidence of femoral neck/head fracture or dislocation. Small curvilinear osseous fragment about the greater trochanter. Mild superolateral hip joint space narrowing and marginal spurring. IMPRESSION: 1. Small curvilinear osseous fragment about the greater trochanter, which may represent a small avulsion fracture. Correlate with localized pain. 2. Mild right hip osteoarthritis. Electronically Signed   By: Larose HiresImran  Ahmed D.O.   On: 03/03/2022 10:14    Procedures Procedures    Medications Ordered in ED Medications - No data to display  ED Course/ Medical Decision Making/ A&P Clinical Course as of 03/03/22 1550  Thu  Mar 03, 2022  1533 Obs her hyaluronic acid after it is given, otherwise needs to bear weight with walker prior to dc. Otherwise unremarkable. Will go home with oral potassium tablets rx. [CP]    Clinical  Course User Index [CP] Olene Floss, PA-C                           Medical Decision Making  66 y.o. female presents to the ED with complaints of fall, LOC, head injury, this involves an extensive number of treatment options, and is a complaint that carries with it a high risk of complications and morbidity.  The differential diagnosis includes intracranial bleeding, skull fracture, hip fracture, vasovagal syncope, electrolyte derangement, AKI  On arrival pt is nontoxic, vitals significant for mild hypertension, otherwise normal.   Additional history obtained from niece at bedside. Previous records obtained and reviewed    Lab Tests:  I Ordered, reviewed, and interpreted labs, which included: hypokalemia of 2.7, no other electrolyte derangements, normal mag, no leukocytosis.  Patient was initially reported to be on Coumadin so INR level was drawn which is normal at 1.1, after further research into patient's records and medical history it appears she is on clopidogrel and not Coumadin and I confirmed this with family at bedside.  Imaging Studies ordered:  I ordered imaging studies which included CT of the head and cervical spine as well as x-rays of the right hip, I independently visualized and interpreted imaging which showed no acute bleeding or skull fracture, no C-spine fracture.  Hip showed possible lucency suggestive of avulsion fracture over the right greater trochanter, CT ordered to better characterize, small bony fragment suggestive of avulsion but no other hip fracture noted.  ED Course:   Fortunately imaging is overall been reassuring.  Patient does have a very small avulsion fracture to the right hip, will ensure that patient is able to bear weight, she walks with a  walker at baseline and has mild pain in this area currently.  Imaging of the head and cervical spine is negative.  Discussed hip fracture with Dr. Criss Alvine, weightbearing as tolerated, patient already ambulates with walker  Patient with mild hypokalemia, 2.7, normal mag, given IV and p.o. potassium replacement.  Notified by nursing staff that unfortunately patient's IV infiltrated while potassium and fluid was running.  Patient has some swelling and discomfort to the forearm.  Pharmacist recommended hyaluronic acid and monitoring for 1 hour if swelling and pain is worsening to the forearm will need additional dose of hyaluronidase  At shift change care signed out to PA Luther Hearing who will reevaluate arm after hyaluronic acid and ensure that patient is able to bear weight at which point she would be appropriate for discharge home, prescription for p.o. potassium prescribed.  Patient will need PCP and orthopedic follow-up.   Portions of this note were generated with Scientist, clinical (histocompatibility and immunogenetics). Dictation errors may occur despite best attempts at proofreading.         Final Clinical Impression(s) / ED Diagnoses Final diagnoses:  Fall, initial encounter  LOC (loss of consciousness) (HCC)  Trochanteric avulsion fracture of femur, right, closed, initial encounter [facility])    Rx / DC Orders ED Discharge Orders     None         Legrand Rams 03/03/22 1610    Pricilla Loveless, MD 03/05/22 1524

## 2022-03-03 NOTE — ED Notes (Signed)
I placed an ice pack on pt's left forearm

## 2022-03-03 NOTE — ED Notes (Signed)
Patient is being discharged from the Urgent Care and sent to the Emergency Department via private vehicle with family member . Per E. Raspet, patient is in need of higher level of care due to fall with possible LOC, pt on anticoagulant. Patient is aware and verbalizes understanding of plan of care.  Vitals:   03/03/22 0830  BP: (!) 96/53  Pulse: (!) 56  SpO2: 97%

## 2022-03-03 NOTE — ED Provider Notes (Signed)
Accepted handoff at shift change from [facility] Please see prior provider note for more detail.   Briefly: Patient is 66 y.o.   DDX: concern for Fall, hypokalemia. Workup complete prior to handoff, however IV potassium infiltrated just prior to DC.  Plan: Obs her hyaluronic acid after it is given, otherwise needs to bear weight with walker prior to dc. Otherwise unremarkable. Will go home with oral potassium tablets rx. She is able to ambulate with walker at this time and is stable for DC.  Arm looks improved after hyaluronic acid, patient reports pain significantly improved.  She is stable for discharge at this time.        RISR  EDTHIS    Olene Floss, PA-C 03/03/22 1726    Rexford Maus, DO 03/03/22 2131

## 2022-03-03 NOTE — ED Notes (Signed)
Pt walked well with a wheeled walker

## 2022-03-03 NOTE — ED Notes (Signed)
I assessed pt's left forearm and the area has not spread past the marked line.

## 2022-03-03 NOTE — ED Notes (Signed)
Heat pack placed on left forearm at site of extravasation

## 2022-03-03 NOTE — ED Notes (Signed)
Patient transported to CT 

## 2022-03-03 NOTE — ED Provider Triage Note (Signed)
Emergency Medicine Provider Triage Evaluation Note  [name] , a 66 y.o. female  was evaluated in triage.  Pt complains of fall secondary to a syncopal episode that occurred yesterday.  Daughter is at bedside provides some of the history.  She states that the patient was having a bowel movement and was straining when she lost consciousness.  Patient was found wedged between the toilet and the wall.  Patient is anticoagulated.  Was seen at urgent care and sent here for further evaluation.  Patient complaining now of right hip pain, dizziness, generalized weakness.  Review of Systems  Positive:  Negative: See above   Physical Exam  BP 103/61 (BP Location: Right Arm)   Pulse (!) 56   Temp 98.7 F (37.1 C) (Oral)   Resp 18   SpO2 95%  Gen:   Awake, no distress   Resp:  Normal effort  MSK:   Moves extremities without difficulty  Other:  Anterior right hip is tender to palpation.   Medical Decision Making  Medically screening exam initiated at 9:34 AM.  Appropriate orders placed.  [name] was informed that the remainder of the evaluation will be completed by another provider, this initial triage assessment does not replace that evaluation, and the importance of remaining in the ED until their evaluation is complete.  Imaging ordered of the head and cervical spine.  She is answering all my questions appropriately at this time.  Based on medication history, there is questionable polypharmacy.   [name], [place] 03/03/22 [phone]

## 2022-03-03 NOTE — ED Notes (Signed)
Pt ambulated with walker.

## 2022-03-03 NOTE — ED Triage Notes (Signed)
Niece presents with pt. Niece states pt fell yesterday after going to bathroom to have BM, pt c/o continued c/o back and neck pain along with HA. Pt is currently taking Plavix. Niece states she was told she did not have loss of consciousness, but today pt states she did.   Eval in triage per E. Raspet, PA.

## 2022-03-03 NOTE — ED Triage Notes (Signed)
Niece stated, she was on the toilet yesterday and I fell off the toilet and hit my head and hurt my back. Her head was stuck between the toilet and the wall and got herself undone.Takes coumadin. She had a stroke in 2017.

## 2022-03-03 NOTE — ED Notes (Signed)
Pt's family member came out and stated pt was complaining of pain in left forearm where potassium was going. Once I finished with pt I was dealing with I went and checked on pt and see her left forearm was swollen. I stopped the IV potassium and notified the Provider [place], [place] and the pharmacist and followed the extravasation protocol of potassium and monitored pt.

## 2022-03-03 NOTE — ED Provider Notes (Signed)
I was called into triage to evaluate patient.  Reports that yesterday she had a possible syncopal episode while in the bathroom and hit her head on the wall.  She believes that she might have lost consciousness but is unsure.  She does report a severe headache which is rated 9 on a 0-10 pain scale and she reports this is the worst of her life.  She is on Plavix.  Based on her [place] head CT rule she requires imaging which we do not have capability of an urgent care.  Recommend she go to the emergency room to which she and family were agreeable.  They declined EMS transport and family will take her directly to [facility], ER for further evaluation and management.   Jeani Hawking, PA-C 03/03/22 2440

## 2022-05-19 ENCOUNTER — Ambulatory Visit (INDEPENDENT_AMBULATORY_CARE_PROVIDER_SITE_OTHER): Payer: Medicare Other | Admitting: Podiatry

## 2022-05-19 DIAGNOSIS — M205X9 Other deformities of toe(s) (acquired), unspecified foot: Secondary | ICD-10-CM

## 2022-05-19 DIAGNOSIS — L84 Corns and callosities: Secondary | ICD-10-CM | POA: Diagnosis not present

## 2022-05-19 DIAGNOSIS — D689 Coagulation defect, unspecified: Secondary | ICD-10-CM | POA: Diagnosis not present

## 2022-05-19 DIAGNOSIS — B351 Tinea unguium: Secondary | ICD-10-CM | POA: Diagnosis not present

## 2022-05-19 DIAGNOSIS — M79674 Pain in right toe(s): Secondary | ICD-10-CM

## 2022-05-19 DIAGNOSIS — I739 Peripheral vascular disease, unspecified: Secondary | ICD-10-CM

## 2022-05-19 DIAGNOSIS — M79675 Pain in left toe(s): Secondary | ICD-10-CM

## 2022-05-19 MED ORDER — UREA 10 % EX CREA: TOPICAL_CREAM | CUTANEOUS | 0 refills | Status: DC | PRN

## 2022-05-19 NOTE — Progress Notes (Signed)
  Subjective:  Patient ID: [name], female    DOB: May 31, 1955,  MRN: [id number]  Chief Complaint  Patient presents with   Callouses    Follow up on pre-ulcerative callus. Patient has not used prescribed cream due to pharmacy not having order.     67 y.o. female presents with the above complaint. History confirmed with patient. Patient presenting related to callus at the left second toe. Also has been using gel toe caps and lotion. Reports also new areas of callus to right 2nd toe, r 5ht met head area, right heel. Pain with pressure of the lesions. Denies history DM but does have PAD, as well as history clotting disorder.  Objective:  Physical Exam: warm, good capillary refill, DP and PT pulses not on palpable bilateral nail exam normal nails without lesions DP pulses non palpable, PT pulses non palpable, and protective sensation intact Left Foot: Mallet toe deformity of the second toe with flexion contracture at the DIPJ.  Hyperkeratotic lesion present distal aspect left second toe Right Foot: Mallet toe deformity right second toe with hyperkeratotic lesion present without underlying ulcer. R lateral heel callus and right plantar 5th met head hyperkeratotic lesion.    Assessment:   1. Pre-ulcerative calluses   2. Pain due to onychomycosis of toenails of both feet   3. Mallet toe, acquired, unspecified laterality   4. PAD (peripheral artery disease) ([place])   5. Blood clotting disorder ([place])        Plan:  Patient was evaluated and treated and all questions answered.  #Hyperkeratotic lesions/pre ulcerative calluses present distal aspect left 2nd toe,  distal aspect right 2nd toe, right plantar 5th met head, right lateral heel All symptomatic hyperkeratoses x 4 lesions were safely debrided with a sterile #15 blade to patient's level of comfort without incident. We discussed preventative and palliative care of these lesions including supportive and accommodative shoegear, padding,  prefabricated and custom molded accommodative orthoses, use of a pumice stone and lotions/creams daily. - Will send Rx for urea 10% cream to the patients pharamcy to apply on callused areas prn for callus buildup.   #Onychomycosis with pain  -Nails palliatively debrided as below. -Educated on self-care  Procedure: Nail Debridement Rationale: Pain Type of Debridement: manual, sharp debridement. Instrumentation: Nail nipper, rotary burr. Number of Nails: 10    Return in about 3 months (around 08/19/2022) for RFC.         Everitt Amber, DPM [facility]

## 2022-09-01 ENCOUNTER — Ambulatory Visit (INDEPENDENT_AMBULATORY_CARE_PROVIDER_SITE_OTHER): Payer: Medicare Other | Admitting: Podiatry

## 2022-09-01 DIAGNOSIS — Z91199 Patient's noncompliance with other medical treatment and regimen due to unspecified reason: Secondary | ICD-10-CM

## 2022-09-01 NOTE — Progress Notes (Signed)
Pt was a no show for apt 

## 2022-10-06 ENCOUNTER — Ambulatory Visit (INDEPENDENT_AMBULATORY_CARE_PROVIDER_SITE_OTHER): Payer: Medicare Other | Admitting: Podiatry

## 2022-10-06 DIAGNOSIS — M79675 Pain in left toe(s): Secondary | ICD-10-CM | POA: Diagnosis not present

## 2022-10-06 DIAGNOSIS — B351 Tinea unguium: Secondary | ICD-10-CM

## 2022-10-06 DIAGNOSIS — L84 Corns and callosities: Secondary | ICD-10-CM | POA: Diagnosis not present

## 2022-10-06 DIAGNOSIS — M79674 Pain in right toe(s): Secondary | ICD-10-CM

## 2022-10-06 DIAGNOSIS — I739 Peripheral vascular disease, unspecified: Secondary | ICD-10-CM | POA: Diagnosis not present

## 2022-10-06 DIAGNOSIS — D689 Coagulation defect, unspecified: Secondary | ICD-10-CM

## 2022-10-06 NOTE — Progress Notes (Signed)
  Subjective:  Patient ID: [name], female    DOB: 03/05/1956,  MRN: [id number]  Chief Complaint  Patient presents with   Nail Problem    Routine Foot Care-nail trim    Callouses    Callus trim to bilateral feet.     67 y.o. female presents with the above complaint. History confirmed with patient. Patient presenting related to callus at the left second toe. Also has been using gel toe caps and lotion. Reports also new areas of callus to right 2nd toe, r 5ht met head area, right heel. Pain with pressure of the lesions. Denies history DM but does have PAD, as well as history clotting disorder.  Objective:  Physical Exam: warm, good capillary refill, DP and PT pulses not on palpable bilateral nail exam normal nails without lesions DP pulses non palpable, PT pulses non palpable, and protective sensation intact Left Foot: Mallet toe deformity of the second toe with flexion contracture at the DIPJ.  Hyperkeratotic lesion present distal aspect left second toe Right Foot: Mallet toe deformity right second toe with hyperkeratotic lesion present without underlying ulcer. R lateral heel callus and right plantar 5th met head hyperkeratotic lesion.    Assessment:   1. Pre-ulcerative calluses   2. Pain due to onychomycosis of toenails of both feet   3. PAD (peripheral artery disease) (HCC)   4. Blood clotting disorder (HCC)         Plan:  Patient was evaluated and treated and all questions answered.  #Hyperkeratotic lesions/pre ulcerative calluses present distal aspect left 2nd toe,  distal aspect right 2nd toe, right plantar 5th met head, right lateral heel All symptomatic hyperkeratoses x 4 lesions were safely debrided with a sterile #15 blade to patient's level of comfort without incident. We discussed preventative and palliative care of these lesions including supportive and accommodative shoegear, padding, prefabricated and custom molded accommodative orthoses, use of a pumice stone  and lotions/creams daily. - Will send Rx for urea 10% cream to the patients pharamcy to apply on callused areas prn for callus buildup.   #Onychomycosis with pain  -Nails palliatively debrided as below. -Educated on self-care  Procedure: Nail Debridement Rationale: Pain Type of Debridement: manual, sharp debridement. Instrumentation: Nail nipper, rotary burr. Number of Nails: 10    Return in about 3 months (around 01/06/2023) for RFC.         Corinna Gab, DPM [facility]

## 2023-01-05 ENCOUNTER — Ambulatory Visit (INDEPENDENT_AMBULATORY_CARE_PROVIDER_SITE_OTHER): Payer: Medicare Other | Admitting: Podiatry

## 2023-01-05 DIAGNOSIS — M79675 Pain in left toe(s): Secondary | ICD-10-CM

## 2023-01-05 DIAGNOSIS — M79674 Pain in right toe(s): Secondary | ICD-10-CM

## 2023-01-05 DIAGNOSIS — B351 Tinea unguium: Secondary | ICD-10-CM | POA: Diagnosis not present

## 2023-01-05 NOTE — Progress Notes (Signed)
Subjective:  Patient ID: [name], female    DOB: 03-25-1955,  MRN: [id number]  [name] presents to clinic today for:  Chief Complaint  Patient presents with   Nail Problem    RM17: RFC   Patient notes nails are thick, discolored, elongated and painful in shoegear when trying to ambulate.  Patient suffered a stroke 7 years ago.  She uses a walker to assist with ambulation.  A family member is with her today.  PCP is Rometta Emery, MD.  Past Medical History:  Diagnosis Date   Alcohol abuse    Chronic back pain    Colitis    CVA (cerebral infarction)    Fatty liver    Gastric ulcer    GI bleed    Hepatitis    Hepatitis C   MDD (major depressive disorder)    Pancreatitis    PTSD (post-traumatic stress disorder)    Seizures (HCC)    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) 11/05/2015   Stroke (HCC)    UTI (lower urinary tract infection)    Vision abnormalities     Past Surgical History:  Procedure Laterality Date   ABDOMINAL HYSTERECTOMY     APPENDECTOMY     COLONOSCOPY WITH PROPOFOL N/A 08/13/2015   Procedure: COLONOSCOPY WITH PROPOFOL;  Surgeon: Rachael Fee, MD;  Location: [facility] ENDOSCOPY;  Service: Endoscopy;  Laterality: N/A;   ESOPHAGOGASTRODUODENOSCOPY N/A 05/23/2014   Procedure: ESOPHAGOGASTRODUODENOSCOPY (EGD);  Surgeon: Beverley Fiedler, MD;  Location: [facility] ENDOSCOPY;  Service: Endoscopy;  Laterality: N/A;   ESOPHAGOGASTRODUODENOSCOPY (EGD) WITH PROPOFOL N/A 08/13/2015   Procedure: ESOPHAGOGASTRODUODENOSCOPY (EGD) WITH PROPOFOL;  Surgeon: Rachael Fee, MD;  Location: [facility] ENDOSCOPY;  Service: Endoscopy;  Laterality: N/A;   ESOPHAGOGASTRODUODENOSCOPY ENDOSCOPY     gall stone removed      Allergies  Allergen Reactions   Banana Other (See Comments)    Welts on skin and sores in mouth   Penicillins Other (See Comments)    Blister Has patient had a PCN reaction causing immediate rash, facial/tongue/throat swelling, SOB or lightheadedness with  hypotension: no Has patient had a PCN reaction causing severe rash involving mucus membranes or skin necrosis: yes - blisters Has patient had a PCN reaction that required hospitalization no Has patient had a PCN reaction occurring within the last 10 years: no If all of the above answers are "NO", then may proceed with Cephalosporin use.    Simbrinza [Brinzolamide-Brimonidine] Swelling    Eyes swell shut    Review of Systems: Negative except as noted in the HPI.  Objective:  [name] is a pleasant 67 y.o. female in NAD. AAO x 3.  Vascular Examination: Capillary refill time is 3-5 seconds to toes bilateral. Palpable pedal pulses b/l LE. Digital hair sparse b/l.  Skin temperature gradient WNL b/l. No varicosities b/l. No cyanosis noted b/l.   Dermatological Examination: Pedal skin with normal turgor, texture and tone b/l. No open wounds. No interdigital macerations b/l. Toenails x10 are 3mm thick, discolored, dystrophic with subungual debris. There is pain with compression of the nail plates.  They are elongated x10  Assessment/Plan: 1. Pain due to onychomycosis of toenails of both feet     The mycotic toenails were sharply debrided x10 with sterile nail nippers and a power debriding burr to decrease bulk/thickness and length.    Return in about 3 months (around 04/07/2023) for [facility] w/ Dr. Annamary Rummage.   Clerance Lav, DPM, FACFAS [facility]     [address] [place], [place] [address]                Office [phone]  Fax [phone]

## 2023-02-20 ENCOUNTER — Emergency Department (HOSPITAL_COMMUNITY): Payer: Medicare Other

## 2023-02-20 ENCOUNTER — Encounter (HOSPITAL_COMMUNITY): Payer: Self-pay

## 2023-02-20 ENCOUNTER — Other Ambulatory Visit: Payer: Self-pay

## 2023-02-20 ENCOUNTER — Emergency Department (HOSPITAL_COMMUNITY)
Admission: EM | Admit: 2023-02-20 | Discharge: 2023-02-21 | Disposition: A | Discharge status: Home / Self Care | Payer: Medicare Other | Attending: Emergency Medicine | Admitting: Emergency Medicine

## 2023-02-20 DIAGNOSIS — S0990XA Unspecified injury of head, initial encounter: Secondary | ICD-10-CM | POA: Diagnosis not present

## 2023-02-20 DIAGNOSIS — S8002XA Contusion of left knee, initial encounter: Secondary | ICD-10-CM | POA: Diagnosis not present

## 2023-02-20 DIAGNOSIS — R0981 Nasal congestion: Secondary | ICD-10-CM | POA: Diagnosis not present

## 2023-02-20 DIAGNOSIS — R531 Weakness: Secondary | ICD-10-CM | POA: Diagnosis not present

## 2023-02-20 DIAGNOSIS — S8001XA Contusion of right knee, initial encounter: Secondary | ICD-10-CM | POA: Insufficient documentation

## 2023-02-20 DIAGNOSIS — R509 Fever, unspecified: Secondary | ICD-10-CM | POA: Insufficient documentation

## 2023-02-20 DIAGNOSIS — R059 Cough, unspecified: Secondary | ICD-10-CM | POA: Diagnosis not present

## 2023-02-20 DIAGNOSIS — M25561 Pain in right knee: Secondary | ICD-10-CM | POA: Diagnosis present

## 2023-02-20 DIAGNOSIS — Z1152 Encounter for screening for COVID-19: Secondary | ICD-10-CM | POA: Insufficient documentation

## 2023-02-20 DIAGNOSIS — W01198A Fall on same level from slipping, tripping and stumbling with subsequent striking against other object, initial encounter: Secondary | ICD-10-CM | POA: Diagnosis not present

## 2023-02-20 DIAGNOSIS — R350 Frequency of micturition: Secondary | ICD-10-CM | POA: Diagnosis not present

## 2023-02-20 DIAGNOSIS — Z8673 Personal history of transient ischemic attack (TIA), and cerebral infarction without residual deficits: Secondary | ICD-10-CM | POA: Insufficient documentation

## 2023-02-20 DIAGNOSIS — M542 Cervicalgia: Secondary | ICD-10-CM | POA: Insufficient documentation

## 2023-02-20 DIAGNOSIS — W19XXXA Unspecified fall, initial encounter: Secondary | ICD-10-CM

## 2023-02-20 LAB — COMPREHENSIVE METABOLIC PANEL
ALT: 15 U/L (ref 0–44)
AST: 23 U/L (ref 15–41)
Albumin: 4.4 g/dL (ref 3.5–5.0)
Alkaline Phosphatase: 63 U/L (ref 38–126)
Anion gap: 12 (ref 5–15)
BUN: 7 mg/dL — ABNORMAL LOW (ref 8–23)
CO2: 25 mmol/L (ref 22–32)
Calcium: 9.9 mg/dL (ref 8.9–10.3)
Chloride: 102 mmol/L (ref 98–111)
Creatinine, Ser: 0.95 mg/dL (ref 0.44–1.00)
GFR, Estimated: >60 mL/min (ref >60)
Glucose, Bld: 120 mg/dL — ABNORMAL HIGH (ref 70–99)
Potassium: 3.2 mmol/L — ABNORMAL LOW (ref 3.5–5.1)
Sodium: 139 mmol/L (ref 135–145)
Total Bilirubin: 0.9 mg/dL (ref <1.2)
Total Protein: 8.2 g/dL — ABNORMAL HIGH (ref 6.5–8.1)

## 2023-02-20 LAB — CBC
HCT: 41.3 % (ref 36.0–46.0)
Hemoglobin: 13.4 g/dL (ref 12.0–15.0)
MCH: 29.8 pg (ref 26.0–34.0)
MCHC: 32.4 g/dL (ref 30.0–36.0)
MCV: 92 fL (ref 80.0–100.0)
Platelets: 171 10*3/uL (ref 150–400)
RBC: 4.49 MIL/uL (ref 3.87–5.11)
RDW: 13.2 % (ref 11.5–15.5)
WBC: 7.9 10*3/uL (ref 4.0–10.5)
nRBC: 0 % (ref 0.0–0.2)

## 2023-02-20 LAB — I-STAT CHEM 8, ED
BUN: 7 mg/dL — ABNORMAL LOW (ref 8–23)
Calcium, Ion: 1.19 mmol/L (ref 1.15–1.40)
Chloride: 102 mmol/L (ref 98–111)
Creatinine, Ser: 0.9 mg/dL (ref 0.44–1.00)
Glucose, Bld: 120 mg/dL — ABNORMAL HIGH (ref 70–99)
HCT: 42 % (ref 36.0–46.0)
Hemoglobin: 14.3 g/dL (ref 12.0–15.0)
Potassium: 3.4 mmol/L — ABNORMAL LOW (ref 3.5–5.1)
Sodium: 141 mmol/L (ref 135–145)
TCO2: 26 mmol/L (ref 22–32)

## 2023-02-20 LAB — URINALYSIS, ROUTINE W REFLEX MICROSCOPIC
Bilirubin Urine: NEGATIVE
Glucose, UA: NEGATIVE mg/dL
Hgb urine dipstick: NEGATIVE
Ketones, ur: NEGATIVE mg/dL
Leukocytes,Ua: NEGATIVE
Nitrite: NEGATIVE
Protein, ur: NEGATIVE mg/dL
Specific Gravity, Urine: 1.005 (ref 1.005–1.030)
pH: 7 (ref 5.0–8.0)

## 2023-02-20 LAB — SAMPLE TO BLOOD BANK

## 2023-02-20 LAB — RESP PANEL BY RT-PCR (RSV, FLU A&B, COVID)  RVPGX2
Influenza A by PCR: NEGATIVE
Influenza B by PCR: NEGATIVE
Resp Syncytial Virus by PCR: NEGATIVE
SARS Coronavirus 2 by RT PCR: NEGATIVE

## 2023-02-20 LAB — CK: Total CK: 234 U/L (ref 38–234)

## 2023-02-20 LAB — I-STAT CG4 LACTIC ACID, ED: Lactic Acid, Venous: 1.3 mmol/L (ref 0.5–1.9)

## 2023-02-20 LAB — PROTIME-INR
INR: 1.2 (ref 0.8–1.2)
Prothrombin Time: 15 s (ref 11.4–15.2)

## 2023-02-20 LAB — ETHANOL: Alcohol, Ethyl (B): <10 mg/dL (ref <10)

## 2023-02-20 MED ORDER — ACETAMINOPHEN 500 MG PO TABS
1000.0000 mg | ORAL_TABLET | ORAL | Status: AC
  Administered 2023-02-20: 1000 mg via ORAL
  Filled 2023-02-20: qty 2

## 2023-02-20 NOTE — ED Triage Notes (Signed)
Patient called family for help and family found her in the floor. Patient reports she hit her head on her dresser, no obvious injury. Patient had several falls today which is abnormal for her and she is febrile at 101.6. Usually patient is stable and able to use walker but today has been "unstable" on her feet.

## 2023-02-20 NOTE — ED Provider Notes (Signed)
[place] EMERGENCY DEPARTMENT AT [facility] Provider Note   CSN: [id number] Arrival date & time: 02/20/23  1842     History {Add pertinent medical, surgical, social history, OB history to HPI:1} Chief Complaint  Patient presents with   [name] is a 67 y.o. female.  67 year old female with a history of alcohol abuse, stroke with residual left-sided weakness, and seizures who presents to the emergency department with generalized weakness and falls.  Over the past few days has had approximately 4 falls.  Family says that typically she will be getting up to do something and then will go to the ground.  Today did have a fall where she hit her head.  Is on Plavix.  Believes that she took it within the past 24 hours.  Complaining of head pain, neck pain, and bilateral knee pain.  Also was noted to have a fever in triage and is saying that she does have some increased frequency.  Also has had some congestion and a cough recently.  Though she does have some left-sided hemiparesis typically is able to ambulate independently with a walker and take care of herself and perform her ADLs.       Home Medications Prior to Admission medications   Medication Sig Start Date End Date Taking? Authorizing Provider  acetaminophen (TYLENOL) 325 MG tablet Take 2 tablets (650 mg total) by mouth every 6 (six) hours as needed for mild pain, headache, fever or moderate pain. 01/31/21   Tyrone Nine, MD  albuterol (PROVENTIL HFA;VENTOLIN HFA) 108 (90 BASE) MCG/ACT inhaler Inhale 2 puffs into the lungs every 8 (eight) hours as needed for wheezing or shortness of breath.    [provider]  atorvastatin (LIPITOR) 20 MG tablet Take 20 mg by mouth daily. 03/08/18   [provider]  betamethasone dipropionate 0.05 % cream Apply topically 2 (two) times daily as needed. 12/08/21   [provider]  buPROPion (WELLBUTRIN) 75 MG tablet Take 75 mg by mouth daily. 11/27/21    [provider]  clopidogrel (PLAVIX) 75 MG tablet Take 1 tablet (75 mg total) by mouth daily. 11/03/16   Nilda Riggs, NP  fluticasone [facility]) 50 MCG/ACT nasal spray Place 1 spray into the nose daily as needed for allergies.  06/04/15   [provider]  gabapentin (NEURONTIN) 300 MG capsule Take 1 capsule (300 mg total) by mouth 3 (three) times daily. 10/26/15   Angiulli, Mcarthur Rossetti, PA-C  hydrochlorothiazide (HYDRODIURIL) 25 MG tablet Take 25 mg by mouth daily. 09/20/18   [provider]  lisinopril (PRINIVIL,ZESTRIL) 20 MG tablet Take 20 mg daily by mouth. 11/15/16   [provider]  mupirocin ointment (BACTROBAN) 2 % Apply 1 Application topically 2 (two) times daily. To yellow spot on hand till better 09/15/21   Zenia Resides, MD  omeprazole (PRILOSEC) 40 MG capsule Take 40 mg by mouth daily. 10/03/21   [provider]  oseltamivir (TAMIFLU) 30 MG capsule Take 1 capsule (30 mg total) by mouth 2 (two) times daily. 01/31/21   Tyrone Nine, MD  potassium chloride SA (KLOR-CON M) 20 MEQ tablet Take 1 tablet (20 mEq total) by mouth daily. 03/03/22   Rosezella Rumpf, PA-C  predniSONE (STERAPRED UNI-PAK 21 TAB) 10 MG (21) TBPK tablet Take by mouth as directed. 12/18/21   [provider]  rOPINIRole (REQUIP) 0.25 MG tablet Take 1 tablet (0.25 mg total) by mouth 3 (three) times daily. Patient  taking differently: Take 0.25 mg by mouth every 8 (eight) hours. 12/02/15   Kirsteins, Victorino Sparrow, MD  senna-docusate (SENOKOT-S) 8.6-50 MG tablet Take 1 tablet by mouth 2 (two) times daily. Patient taking differently: Take 1 tablet by mouth daily as needed for mild constipation. 11/05/15   Marcello Fennel, MD  sertraline (ZOLOFT) 50 MG tablet Take 1 tablet (50 mg total) by mouth daily. Patient taking differently: Take 50 mg by mouth at bedtime. 10/26/15   Angiulli, Mcarthur Rossetti, PA-C  thiamine (VITAMIN B-1) 100 MG tablet Take 100 mg by mouth daily. 08/28/18    [provider]  timolol (TIMOPTIC) 0.5 % ophthalmic solution 1 drop every morning. 08/14/20   [provider]  traMADol (ULTRAM) 50 MG tablet Take 50 mg by mouth every 6 (six) hours as needed for moderate pain. 03/21/18   [provider]  Travoprost, BAK Free, (TRAVATAN) 0.004 % SOLN ophthalmic solution Place 1 drop into both eyes at bedtime.     [provider]  traZODone (DESYREL) 100 MG tablet Take 100 mg by mouth at bedtime. 01/07/21   [provider]  triamcinolone cream (KENALOG) 0.1 % Apply 1 Application topically 3 (three) times daily. To rash area till better 09/15/21   Zenia Resides, MD  urea (CARMOL) 10 % cream Apply topically as needed. 02/17/22   Standiford, Jenelle Mages, DPM  urea (CARMOL) 10 % cream Apply topically as needed. 05/19/22   Standiford, Jenelle Mages, DPM  zolpidem (AMBIEN) 5 MG tablet Take 5 mg by mouth at bedtime as needed for sleep. 01/07/21   [provider]      Allergies    Banana, Penicillins, and Simbrinza [brinzolamide-brimonidine]    Review of Systems   Review of Systems  Physical Exam Updated Vital Signs BP 136/81 (BP Location: Right Arm)   Pulse 92   Temp (!) 101.6 F (38.7 C) (Oral)   Resp 16   Ht 5' (1.524 m)   Wt 59 kg   SpO2 96%   BMI 25.39 kg/m  Physical Exam Constitutional:      General: She is not in acute distress.    Appearance: Normal appearance. She is not ill-appearing.  HENT:     Head: Normocephalic and atraumatic.     Right Ear: External ear normal.     Left Ear: External ear normal.     Mouth/Throat:     Mouth: Mucous membranes are moist.     Pharynx: Oropharynx is clear.  Eyes:     Extraocular Movements: Extraocular movements intact.     Conjunctiva/sclera: Conjunctivae normal.     Pupils: Pupils are equal, round, and reactive to light.  Neck:     Comments: C-spine midline tenderness to palpation Cardiovascular:     Rate and Rhythm: Normal rate and regular rhythm.      Pulses: Normal pulses.     Heart sounds: Normal heart sounds.  Pulmonary:     Effort: Pulmonary effort is normal. No respiratory distress.     Breath sounds: Normal breath sounds.  Abdominal:     General: Abdomen is flat. There is no distension.     Palpations: Abdomen is soft. There is no mass.     Tenderness: There is no abdominal tenderness. There is no guarding.  Musculoskeletal:        General: No deformity. Normal range of motion.     Cervical back: No rigidity or tenderness.     Comments: No tenderness to palpation of midline thoracic  or lumbar spine.  No step-offs palpated.  No tenderness to palpation of chest wall.  No bruising noted.  No tenderness to palpation of bilateral clavicles.  No tenderness to palpation, bruising, or deformities noted of bilateral shoulders, elbows, wrists, hips, or ankles.  Tenderness to palpation of bilateral knees with bruising  Neurological:     Mental Status: She is alert and oriented to person, place, and time. Mental status is at baseline.     Cranial Nerves: No cranial nerve deficit.     Sensory: No sensory deficit.     Motor: Weakness (Left face, left arm, and left lower extremity) present.     ED Results / Procedures / Treatments   Labs (all labs ordered are listed, but only abnormal results are displayed) Labs Reviewed  COMPREHENSIVE METABOLIC PANEL  CBC  ETHANOL  URINALYSIS, ROUTINE W REFLEX MICROSCOPIC  PROTIME-INR  I-STAT CHEM 8, ED  I-STAT CG4 LACTIC ACID, ED  SAMPLE TO BLOOD BANK    EKG None  Radiology No results found.  Procedures Procedures  {Document cardiac monitor, telemetry assessment procedure when appropriate:1}  Medications Ordered in ED Medications - No data to display  ED Course/ Medical Decision Making/ A&P   {   Click here for ABCD2, HEART and other calculatorsREFRESH Note before signing :1}                              Medical Decision Making Amount and/or Complexity of Data Reviewed Labs:  ordered. Radiology: ordered.  Risk OTC drugs.   ***  {Document critical care time when appropriate:1} {Document review of labs and clinical decision tools ie heart score, Chads2Vasc2 etc:1}  {Document your independent review of radiology images, and any outside records:1} {Document your discussion with family members, caretakers, and with consultants:1} {Document social determinants of health affecting pt's care:1} {Document your decision making why or why not admission, treatments were needed:1} Final Clinical Impression(s) / ED Diagnoses Final diagnoses:  None    Rx / DC Orders ED Discharge Orders     None

## 2023-02-20 NOTE — Progress Notes (Signed)
   02/20/23 1855  Spiritual Encounters  Type of Visit Initial  Care provided to: Pt not available  Referral source Trauma page  Reason for visit Trauma  OnCall Visit No   Chaplain responded to a level two trauma. The patient, [name], was attended to by the medical team. Family is present and supporting the patient. If a chaplain is requested someone will respond.   Valerie Roys [facility]  [phone]

## 2023-02-20 NOTE — ED Notes (Signed)
Transported to CT scan

## 2023-02-21 DIAGNOSIS — S8001XA Contusion of right knee, initial encounter: Secondary | ICD-10-CM | POA: Diagnosis not present

## 2023-02-21 MED ORDER — CLOPIDOGREL BISULFATE 75 MG PO TABS
75.0000 mg | ORAL_TABLET | Freq: Every day | ORAL | Status: DC
  Administered 2023-02-21: 75 mg via ORAL
  Filled 2023-02-21: qty 1

## 2023-02-21 MED ORDER — TIMOLOL MALEATE 0.5 % OP SOLN
1.0000 [drp] | Freq: Every morning | OPHTHALMIC | Status: DC
Start: 2023-02-21 — End: 2023-02-21
  Administered 2023-02-21: 1 [drp] via OPHTHALMIC
  Filled 2023-02-21: qty 5

## 2023-02-21 MED ORDER — BUPROPION HCL 75 MG PO TABS
75.0000 mg | ORAL_TABLET | Freq: Every day | ORAL | Status: DC
  Administered 2023-02-21: 75 mg via ORAL
  Filled 2023-02-21: qty 1

## 2023-02-21 MED ORDER — GABAPENTIN 300 MG PO CAPS
300.0000 mg | ORAL_CAPSULE | Freq: Three times a day (TID) | ORAL | Status: DC
  Administered 2023-02-21: 300 mg via ORAL
  Filled 2023-02-21: qty 1

## 2023-02-21 MED ORDER — ROPINIROLE HCL 0.25 MG PO TABS
0.2500 mg | ORAL_TABLET | Freq: Three times a day (TID) | ORAL | Status: DC
  Administered 2023-02-21: 0.25 mg via ORAL
  Filled 2023-02-21 (×3): qty 1

## 2023-02-21 MED ORDER — HYDROCHLOROTHIAZIDE 25 MG PO TABS
25.0000 mg | ORAL_TABLET | Freq: Every day | ORAL | Status: DC
  Administered 2023-02-21: 25 mg via ORAL
  Filled 2023-02-21: qty 1

## 2023-02-21 MED ORDER — TRAZODONE HCL 50 MG PO TABS
100.0000 mg | ORAL_TABLET | Freq: Every day | ORAL | Status: DC
Start: 2023-02-21 — End: 2023-02-21

## 2023-02-21 MED ORDER — SERTRALINE HCL 50 MG PO TABS
50.0000 mg | ORAL_TABLET | Freq: Every day | ORAL | Status: DC
  Administered 2023-02-21: 50 mg via ORAL
  Filled 2023-02-21: qty 1

## 2023-02-21 MED ORDER — ALBUTEROL SULFATE HFA 108 (90 BASE) MCG/ACT IN AERS: 2.0000 | INHALATION_SPRAY | Freq: Three times a day (TID) | RESPIRATORY_TRACT | Status: DC | PRN

## 2023-02-21 MED ORDER — ALBUTEROL SULFATE (2.5 MG/3ML) 0.083% IN NEBU: 2.5000 mg | INHALATION_SOLUTION | Freq: Four times a day (QID) | RESPIRATORY_TRACT | Status: DC | PRN

## 2023-02-21 MED ORDER — ATORVASTATIN CALCIUM 10 MG PO TABS
20.0000 mg | ORAL_TABLET | Freq: Every day | ORAL | Status: DC
  Administered 2023-02-21: 20 mg via ORAL
  Filled 2023-02-21: qty 2

## 2023-02-21 MED ORDER — ACETAMINOPHEN 325 MG PO TABS
650.0000 mg | ORAL_TABLET | Freq: Four times a day (QID) | ORAL | Status: DC | PRN
  Administered 2023-02-21: 650 mg via ORAL
  Filled 2023-02-21: qty 2

## 2023-02-21 MED ORDER — SENNOSIDES-DOCUSATE SODIUM 8.6-50 MG PO TABS: 1.0000 | ORAL_TABLET | Freq: Every day | ORAL | Status: DC | PRN

## 2023-02-21 NOTE — ED Notes (Signed)
RN called niece per pts request.

## 2023-02-21 NOTE — ED Notes (Signed)
Pt niece came by and took pt home. Pt was wheeled out to the waiting room by family.

## 2023-02-21 NOTE — ED Provider Notes (Signed)
Patient has been seen and evaluate by physical therapy, the recommendation is for discharge with home health.  Patient declines this offer, was made aware by her social work colleagues that should she change her mind, she can pursue this with primary care.   Gerhard Munch, MD 02/21/23 1213

## 2023-02-21 NOTE — ED Notes (Signed)
Niece is coming to pick up pt.

## 2023-02-21 NOTE — Evaluation (Signed)
Physical Therapy Evaluation Patient Details Name: [name] MRN: [id number] DOB: 11-Apr-1955 Today's Date: 02/21/2023  History of Present Illness  67 y.o. female presents to [facility] hospital on 02/20/2023 with generalized weakness and falls. No acute findings. PMH: CVA, HTN, asthma, GIB, PTSD, ongoing tobacco use disorder.  Clinical Impression  Pt presents to PT with deficits in strength, power, gait, balance, functional mobility, endurance, however she may not be far from baseline. Pt has residual L weakness from prior CVA and ambulates with support of a rollator at baseline. Today the pt is able to ambulate for household distances with RW and increased time. PT notes decreased step length with LLE due to weakness, however no instability noted. Pt reports she has times where she gets lightheaded when she gets up too quickly at home, PT provides education on methods to reduce risk for potential orthostatic hypotension. Pt reports having consistent caregiver support at home from family. PT recommends discharge home with HHPT when medically appropriate.      If plan is discharge home, recommend the following: A little help with walking and/or transfers;A little help with bathing/dressing/bathroom;Assistance with cooking/housework;Direct supervision/assist for medications management;Direct supervision/assist for financial management;Assist for transportation;Help with stairs or ramp for entrance   Can travel by private vehicle        Equipment Recommendations None recommended by PT  Recommendations for Other Services       Functional Status Assessment Patient has had a recent decline in their functional status and demonstrates the ability to make significant improvements in function in a reasonable and predictable amount of time.     Precautions / Restrictions Precautions Precautions: Fall Precaution Comments: hx of CVA with residual L weakness Restrictions Weight Bearing Restrictions: No       Mobility  Bed Mobility Overal bed mobility:  (received and left sitting in bedside chair)                  Transfers Overall transfer level: Needs assistance Equipment used: Rolling walker (2 wheels) Transfers: Sit to/from Stand Sit to Stand: Supervision                Ambulation/Gait Ambulation/Gait assistance: Supervision Gait Distance (Feet): 70 Feet Assistive device: Rolling walker (2 wheels) Gait Pattern/deviations: Step-to pattern Gait velocity: reduced Gait velocity interpretation: <1.31 ft/sec, indicative of household ambulator   General Gait Details: slowed step-to gait, reduced step length with LLE. Pt is able to increase LLE step length with verbal cues  Stairs            Wheelchair Mobility     Tilt Bed    Modified Rankin (Stroke Patients Only)       Balance Overall balance assessment: Needs assistance Sitting-balance support: No upper extremity supported, Feet supported Sitting balance-Leahy Scale: Good     Standing balance support: Bilateral upper extremity supported, Reliant on assistive device for balance Standing balance-Leahy Scale: Poor                               Pertinent Vitals/Pain Pain Assessment Pain Assessment: No/denies pain    Home Living Family/patient expects to be discharged to:: Private residence Living Arrangements: Other relatives (niece and nephew-in-law) Available Help at Discharge: Family;Available 24 hours/day Type of Home: House Home Access: Stairs to enter Entrance Stairs-Rails: None Entrance Stairs-Number of Steps: 2 Alternate Level Stairs-Number of Steps: 14 Home Layout: Able to live on main level with bedroom/bathroom;Two level Home Equipment: Rollator (  4 wheels);Cane - quad;Shower seat;Wheelchair - power;BSC/3in1 (pt reports she has a power wheelchair during OT eval, does not mention to PT)      Prior Function Prior Level of Function : Needs assist             Mobility  Comments: ambulates with support of rollator, supervision for stairs ADLs Comments: Mod I unless patient asks for assist.  Family assists with meds, meals and community mobility     Extremity/Trunk Assessment   Upper Extremity Assessment Upper Extremity Assessment: Defer to OT evaluation    Lower Extremity Assessment Lower Extremity Assessment: LLE deficits/detail LLE Deficits / Details: pt with grossly 4-/5 strength    Cervical / Trunk Assessment Cervical / Trunk Assessment: Kyphotic  Communication   Communication Communication: No apparent difficulties Cueing Techniques: Verbal cues  Cognition Arousal: Alert Behavior During Therapy: WFL for tasks assessed/performed Overall Cognitive Status: No family/caregiver present to determine baseline cognitive functioning                                 General Comments: pt is alert and oriented x4, pt follows commands well, appears to have fari awareness of her deficits        General Comments General comments (skin integrity, edema, etc.): VSS on RA    Exercises     Assessment/Plan    PT Assessment Patient needs continued PT services  PT Problem List Decreased strength;Decreased activity tolerance;Decreased balance;Decreased mobility;Decreased knowledge of use of DME       PT Treatment Interventions DME instruction;Gait training;Stair training;Functional mobility training;Therapeutic activities;Therapeutic exercise;Balance training;Neuromuscular re-education;Patient/family education    PT Goals (Current goals can be found in the Care Plan section)  Acute Rehab PT Goals Patient Stated Goal: to improve LLE strength, reduce falls risk PT Goal Formulation: With patient Time For Goal Achievement: 03/07/23 Potential to Achieve Goals: Fair    Frequency Min 1X/week     Co-evaluation               AM-PAC PT "6 Clicks" Mobility  Outcome Measure Help needed turning from your back to your side while in a  flat bed without using bedrails?: A Little Help needed moving from lying on your back to sitting on the side of a flat bed without using bedrails?: A Little Help needed moving to and from a bed to a chair (including a wheelchair)?: A Little Help needed standing up from a chair using your arms (e.g., wheelchair or bedside chair)?: A Little Help needed to walk in hospital room?: A Little Help needed climbing 3-5 steps with a railing? : A Little 6 Click Score: 18    End of Session Equipment Utilized During Treatment: Gait belt Activity Tolerance: Patient tolerated treatment well Patient left: in chair (no call bell available as pt in hallway room) Nurse Communication: Mobility status PT Visit Diagnosis: Other abnormalities of gait and mobility (R26.89);Muscle weakness (generalized) (M62.81)    Time: 7829-5621 PT Time Calculation (min) (ACUTE ONLY): 10 min   Charges:   PT Evaluation $PT Eval Low Complexity: 1 Low   PT General Charges $$ ACUTE PT VISIT: 1 Visit         Arlyss Gandy, PT, DPT Acute Rehabilitation Office [phone]   Arlyss Gandy 02/21/2023, 10:03 AM

## 2023-02-21 NOTE — ED Notes (Signed)
RN called and spoke to pt family at this time. Explained that pt is unsteady on a walker and needs assistance doing ADLs. Family states pt has someone with her at all times. RN also explained to pt family that pt refused home health assistance offered by Case management. Will continue to monitor. RN will notify family again to pick up pt as pt is cleared by MD.

## 2023-02-21 NOTE — ED Notes (Signed)
Gabriel Rung ( daughter) [phone]

## 2023-02-21 NOTE — Evaluation (Signed)
Occupational Therapy Evaluation Patient Details Name: [name] MRN: [id number] DOB: February 09, 1956 Today's Date: 02/21/2023   History of Present Illness 67 y.o. female presents to [facility] hospital on 02/20/2023 with generalized weakness and falls. No acute findings. PMH: CVA, HTN, asthma, GIB, PTSD, ongoing tobacco use disorder.   Clinical Impression   Patient admitted for the diagnosis above.  PTA she lives at home with her niece and nephew, who provide supportive assist as needed.  Generally she walks with a RW, needs assist with meds, and can perform her own ADL depending on how she feels that day.  Currently she is needing CGA to supervision for light mobility and ADL completion.  OT will follow in the acute setting to address deficits, but no post acute OT is anticipated given family assist.  HH OT can be considered if the family wishes.         If plan is discharge home, recommend the following: Assist for transportation;Assistance with cooking/housework;Direct supervision/assist for financial management;Direct supervision/assist for medications management;A little help with walking and/or transfers;A little help with bathing/dressing/bathroom    Functional Status Assessment  Patient has had a recent decline in their functional status and demonstrates the ability to make significant improvements in function in a reasonable and predictable amount of time.  Equipment Recommendations  None recommended by OT    Recommendations for Other Services       Precautions / Restrictions Precautions Precautions: Fall Restrictions Weight Bearing Restrictions: No      Mobility Bed Mobility Overal bed mobility: Modified Independent                  Transfers Overall transfer level: Needs assistance Equipment used: Rolling walker (2 wheels) Transfers: Sit to/from Stand, Bed to chair/wheelchair/BSC Sit to Stand: Supervision     Step pivot transfers: Supervision, Contact guard assist             Balance Overall balance assessment: Needs assistance Sitting-balance support: Feet unsupported Sitting balance-Leahy Scale: Good     Standing balance support: Reliant on assistive device for balance Standing balance-Leahy Scale: Poor                             ADL either performed or assessed with clinical judgement   ADL       Grooming: Set up;Sitting               Lower Body Dressing: Supervision/safety;Sit to/from stand   Toilet Transfer: Supervision/safety;Contact guard assist;Ambulation                   Vision Patient Visual Report: No change from baseline       Perception Perception: Not tested       Praxis Praxis: Not tested       Pertinent Vitals/Pain Pain Assessment Pain Assessment: Faces Faces Pain Scale: Hurts little more Pain Location: R knee Pain Descriptors / Indicators: Aching, Sore Pain Intervention(s): Monitored during session     Extremity/Trunk Assessment Upper Extremity Assessment Upper Extremity Assessment: Generalized weakness   Lower Extremity Assessment Lower Extremity Assessment: Defer to PT evaluation   Cervical / Trunk Assessment Cervical / Trunk Assessment: Kyphotic   Communication Communication Communication: No apparent difficulties   Cognition Arousal: Alert Behavior During Therapy: WFL for tasks assessed/performed Overall Cognitive Status: No family/caregiver present to determine baseline cognitive functioning  General Comments   VSS on RA    Exercises     Shoulder Instructions      Home Living Family/patient expects to be discharged to:: Private residence Living Arrangements: Other relatives Available Help at Discharge: Family;Available 24 hours/day Type of Home: House Home Access: Stairs to enter [employer] of Steps: 1 Entrance Stairs-Rails: None Home Layout: Able to live on main level with  bedroom/bathroom;Two level Alternate Level Stairs-Number of Steps: 14 Alternate Level Stairs-Rails: Left Bathroom Shower/Tub: Producer, television/film/video: Handicapped height Bathroom Accessibility: Yes How Accessible: Accessible via walker Home Equipment: Rollator (4 wheels);Cane - quad;Shower seat;Wheelchair - power          Prior Functioning/Environment Prior Level of Function : Needs assist             Mobility Comments: Supervision for stairs, Mod I with 4WRW ADLs Comments: Mod I unless patient asks for assist.  Family assists with meds, meals and community mobility        OT Problem List: Decreased strength;Decreased activity tolerance;Impaired balance (sitting and/or standing);Pain      OT Treatment/Interventions: Self-care/ADL training;Therapeutic activities;Balance training;DME and/or AE instruction    OT Goals(Current goals can be found in the care plan section) Acute Rehab OT Goals Patient Stated Goal: Return home OT Goal Formulation: With patient Time For Goal Achievement: 03/07/23 Potential to Achieve Goals: Good  OT Frequency: Min 1X/week    Co-evaluation              AM-PAC OT "6 Clicks" Daily Activity     Outcome Measure Help from another person eating meals?: None Help from another person taking care of personal grooming?: A Little Help from another person toileting, which includes using toliet, bedpan, or urinal?: A Little Help from another person bathing (including washing, rinsing, drying)?: A Little Help from another person to put on and taking off regular upper body clothing?: None Help from another person to put on and taking off regular lower body clothing?: A Little 6 Click Score: 20   End of Session Equipment Utilized During Treatment: Rolling walker (2 wheels) Nurse Communication: Mobility status  Activity Tolerance: Patient tolerated treatment well Patient left: in chair;with call bell/phone within reach  OT Visit  Diagnosis: Unsteadiness on feet (R26.81);Pain Pain - Right/Left: Left Pain - part of body: Knee                Time: 1610-9604 OT Time Calculation (min): 22 min Charges:  OT General Charges $OT Visit: 1 Visit OT Evaluation $OT Eval Moderate Complexity: 1 Mod  02/21/2023  RP, OTR/L  Acute Rehabilitation Services  Office:  [phone]   [name] 02/21/2023, 9:15 AM

## 2023-02-21 NOTE — ED Provider Notes (Signed)
8:10 AM Patient awake, alert, in no distress, awaiting assessment from PT, OT.  Home meds ordered.   Gerhard Munch, MD 02/21/23 [phone]

## 2023-02-21 NOTE — Discharge Instructions (Signed)
Your evaluation today has been largely reassuring.  But, it is important that you monitor your condition carefully, and do not hesitate to return to the ED if you develop new, or concerning changes in your condition. ° °Otherwise, please follow-up with your physician for appropriate ongoing care. ° °

## 2023-02-21 NOTE — Discharge Planning (Signed)
RNCM has recommended that this patient have Home Health Services but pt declines at this time. I have discussed the benefits of home health services as wll as the risks of not having home health services with pt. RNCM informed patient that if she changed her mind, her primary care physician can order home health from office.The patient verbalizes understanding. No further RNCM needs identified at this time.  

## 2023-04-07 ENCOUNTER — Ambulatory Visit: Payer: Medicare Other | Admitting: Podiatry

## 2023-04-12 ENCOUNTER — Ambulatory Visit (INDEPENDENT_AMBULATORY_CARE_PROVIDER_SITE_OTHER): Payer: Medicare Other | Admitting: Podiatry

## 2023-04-12 DIAGNOSIS — M79674 Pain in right toe(s): Secondary | ICD-10-CM

## 2023-04-12 DIAGNOSIS — B351 Tinea unguium: Secondary | ICD-10-CM

## 2023-04-12 DIAGNOSIS — M79675 Pain in left toe(s): Secondary | ICD-10-CM | POA: Diagnosis not present

## 2023-04-12 DIAGNOSIS — D689 Coagulation defect, unspecified: Secondary | ICD-10-CM

## 2023-04-12 DIAGNOSIS — I739 Peripheral vascular disease, unspecified: Secondary | ICD-10-CM | POA: Diagnosis not present

## 2023-04-12 NOTE — Progress Notes (Signed)
  Subjective:  Patient ID: [name], female    DOB: 10/12/1955,   MRN: [id number]  No chief complaint on file.   68 y.o. female presents for concern of thickened elongated and painful nails that are difficult to trim. Requesting to have them trimmed today. History of PAD and clotting disorder and at risk for foot care.   PCP:  Rometta Emery, MD    . Denies any other pedal complaints. Denies n/v/f/c.   Past Medical History:  Diagnosis Date   Alcohol abuse    Chronic back pain    Colitis    CVA (cerebral infarction)    Fatty liver    Gastric ulcer    GI bleed    Hepatitis    Hepatitis C   MDD (major depressive disorder)    Pancreatitis    PTSD (post-traumatic stress disorder)    Seizures (HCC)    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) 11/05/2015   Stroke (HCC)    UTI (lower urinary tract infection)    Vision abnormalities     Objective:  Physical Exam: Vascular: DP/PT pulses 1/4 bilateral. CFT <3 seconds. Normal hair growth on digits. No edema.  Skin. No lacerations or abrasions bilateral feet. Nails 1-5 bilateral are thickened elongated and dystrophic.   Musculoskeletal: MMT 5/5 bilateral lower extremities in DF, PF, Inversion and Eversion. Deceased ROM in DF of ankle joint.  Neurological: Sensation intact to light touch.   Assessment:   1. Pain due to onychomycosis of toenails of both feet   2. PAD (peripheral artery disease) (HCC)   3. Blood clotting disorder (HCC)      Plan:  Patient was evaluated and treated and all questions answered. -Mechanically debrided all nails 1-5 bilateral using sterile nail nipper and filed with dremel without incident  -Answered all patient questions -Patient to return  in 3 months for at risk foot care -Patient advised to call the office if any problems or questions arise in the meantime.   Louann Sjogren, DPM

## 2023-05-05 ENCOUNTER — Ambulatory Visit: Payer: Medicare Other | Attending: Internal Medicine

## 2023-05-05 ENCOUNTER — Other Ambulatory Visit: Payer: Self-pay

## 2023-05-05 DIAGNOSIS — R2681 Unsteadiness on feet: Secondary | ICD-10-CM | POA: Insufficient documentation

## 2023-05-05 DIAGNOSIS — R2689 Other abnormalities of gait and mobility: Secondary | ICD-10-CM | POA: Diagnosis present

## 2023-05-05 DIAGNOSIS — M6281 Muscle weakness (generalized): Secondary | ICD-10-CM | POA: Insufficient documentation

## 2023-05-05 NOTE — Therapy (Signed)
OUTPATIENT PHYSICAL THERAPY LOWER EXTREMITY EVALUATION   Patient Name: [name] MRN: [id number] DOB:17-Aug-1955, 68 y.o., female Today's Date: 05/05/2023  END OF SESSION:  PT End of Session - 05/05/23 1113     Visit Number 1    Number of Visits 17    Date for PT Re-Evaluation 06/30/23    Authorization Type Medicare    PT Start Time 1015    PT Stop Time 1105    PT Time Calculation (min) 50 min    Activity Tolerance Patient tolerated treatment well    Behavior During Therapy WFL for tasks assessed/performed             Past Medical History:  Diagnosis Date   Alcohol abuse    Chronic back pain    Colitis    CVA (cerebral infarction)    Fatty liver    Gastric ulcer    GI bleed    Hepatitis    Hepatitis C   MDD (major depressive disorder)    Pancreatitis    PTSD (post-traumatic stress disorder)    Seizures (HCC)    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) 11/05/2015   Stroke (HCC)    UTI (lower urinary tract infection)    Vision abnormalities    Past Surgical History:  Procedure Laterality Date   ABDOMINAL HYSTERECTOMY     APPENDECTOMY     COLONOSCOPY WITH PROPOFOL N/A 08/13/2015   Procedure: COLONOSCOPY WITH PROPOFOL;  Surgeon: Rachael Fee, MD;  Location: [facility] ENDOSCOPY;  Service: Endoscopy;  Laterality: N/A;   ESOPHAGOGASTRODUODENOSCOPY N/A 05/23/2014   Procedure: ESOPHAGOGASTRODUODENOSCOPY (EGD);  Surgeon: Beverley Fiedler, MD;  Location: [facility] ENDOSCOPY;  Service: Endoscopy;  Laterality: N/A;   ESOPHAGOGASTRODUODENOSCOPY (EGD) WITH PROPOFOL N/A 08/13/2015   Procedure: ESOPHAGOGASTRODUODENOSCOPY (EGD) WITH PROPOFOL;  Surgeon: Rachael Fee, MD;  Location: [facility] ENDOSCOPY;  Service: Endoscopy;  Laterality: N/A;   ESOPHAGOGASTRODUODENOSCOPY ENDOSCOPY     gall stone removed     Patient Active Problem List   Diagnosis Date Noted   Mallet toe, acquired, unspecified laterality 11/26/2021   Callus 08/23/2021   Pincer nail deformity 05/19/2021   Hypotension  01/28/2021   AKI (acute kidney injury) (HCC) 01/28/2021   Prolonged QT interval 01/28/2021   Influenza A 01/28/2021   Plantar fasciitis of right foot 01/15/2021   Hyperlipidemia 11/03/2016   Former smoker 01/11/2016   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/05/2015   Left hip pain    History of GI bleed    Spastic bladder    Chronic back pain    ETOH abuse    Hypokalemia    PTSD (post-traumatic stress disorder)    Gastroesophageal reflux disease without esophagitis    Acute ischemic stroke (HCC) 09/27/2015   Acute CVA (cerebrovascular accident) (HCC) 09/27/2015   Smoker    Left-sided weakness 09/26/2015   Acute gastric ulcer    GI bleed 05/22/2014   Acute GI bleeding 05/22/2014   Acute blood loss anemia 05/22/2014   History of stroke 05/22/2014   Mixed hyperlipidemia 05/22/2014   Gastrointestinal hemorrhage with melena    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) 10/09/2012   Flank pain 10/09/2012   Right pontine stroke (HCC) 10/09/2012   ABDOMINAL PAIN OTHER SPECIFIED SITE 05/17/2010   Alcohol abuse 06/16/2007   Cocaine abuse (HCC) 06/16/2007   DEPRESSION 06/16/2007   Essential hypertension 06/16/2007   Cerebral artery occlusion with cerebral infarction (HCC) 06/16/2007   Asthma  06/16/2007   GERD 06/16/2007   PANCREATITIS 11/09/2006   ESOPHAGITIS, REFLUX 05/23/2006    PCP: Rometta Emery, MD  REFERRING PROVIDER: Rometta Emery, MD  REFERRING DIAG: muscle weakness of the lower extremities from a history of fibromyalgia   THERAPY DIAG:  Muscle weakness (generalized)  Other abnormalities of gait and mobility  Unsteadiness on feet  Rationale for Evaluation and Treatment: Rehabilitation  ONSET DATE: Chronic  SUBJECTIVE:   SUBJECTIVE STATEMENT: Pt presents to PT with niece who assists with subjective. Pt with hx of CVA with resultant L sided weakness in 2017, now presenting as chronic  stroke with balance, gait, and strength impairments. Has had a few falls over past months and generally notes that she needs assistance with most higher level ADLs. Can only stand for at max 10 minutes before pain and fatigue which frustrates her greatly. She used to have an AFO on her L foot which helped but she is unable to use it anymore because it has worn out. She is interested about getting a new one.  PERTINENT HISTORY: CVA, HTN, spastic hemiplegia   PAIN:  Are you having pain?  Yes: NPRS scale: 2/10 Worst: 7/10 Pain location: L LE and lower back Pain description: numb, sore Aggravating factors: walking, standing Relieving factors: rest  PRECAUTIONS: Fall  RED FLAGS: None   WEIGHT BEARING RESTRICTIONS: No  FALLS:  Has patient fallen in last 6 months? Yes. Number of falls - 2 falls due to LoB while walking  LIVING ENVIRONMENT: Lives with: lives with their family Lives in: House/apartment Stairs: Yes: Internal: 12 steps; on right going up and External: 1 steps; none Has following equipment at home: Walker - 4 wheeled and shower chair  OCCUPATION: Not working  PLOF: Requires assistive device for independence and needs assistance with higher level   PATIENT GOALS: improve standing tolerance and balance  NEXT MD VISIT: PRN  OBJECTIVE:  Note: Objective measures were completed at Evaluation unless otherwise noted.  DIAGNOSTIC FINDINGS:   N/A  PATIENT SURVEYS:  None assessed at evaluation  COGNITION: Overall cognitive status: Within functional limits for tasks assessed     SENSATION: Light touch: Impaired - L LE  EDEMA:  DNT  MUSCLE LENGTH: Hamstrings: DNT Thomas test: DNT  POSTURE: rounded shoulders, forward head, and flexed trunk   PALPATION: No overt TTP  LOWER EXTREMITY ROM:  Active ROM Right eval Left eval  Hip flexion    Hip extension    Hip abduction    Hip adduction    Hip internal rotation    Hip external rotation    Knee flexion     Knee extension    Ankle dorsiflexion 10 Lacking 9  Ankle plantarflexion    Ankle inversion    Ankle eversion     (Blank rows = not tested)  LOWER EXTREMITY MMT:  MMT Right eval Left eval  Hip flexion 3+/5 2+/5  Hip extension    Hip abduction 3+/5 2+/5  Hip adduction    Hip internal rotation    Hip external rotation    Knee flexion 3+/5 2+/5  Knee extension 3+/5 2+/5  Ankle dorsiflexion    Ankle plantarflexion    Ankle inversion    Ankle eversion     (Blank rows = not tested)  LOWER EXTREMITY SPECIAL TESTS:  DNT  FUNCTIONAL TESTS:  30 Second Sit to Stand: 4 reps with UE TUG: 55 sec with rollator and clinic AFO  GAIT: Distance walked: 30ft Assistive device utilized: Environmental consultant -  4 wheeled Level of assistance: SBA Comments: decrease L heel strike, L knee hyperextension   TREATMENT: [facility] Adult PT Treatment:                                                  Therapeutic Exercise: STS x 5 Long sitting calf stretch x 30" L LAQ x 5 each Seated clamshell x 10 RTB  PATIENT EDUCATION:  Education details: eval findings, FOTO, HEP, POC Person educated: Patient Education method: Explanation, Demonstration, and Handouts Education comprehension: verbalized understanding and returned demonstration  HOME EXERCISE PROGRAM: Access Code: [email or web address] URL: [email or web address] Date: 05/05/2023 Prepared by: Edwinna Areola  Exercises - Sit to Stand  - 1 x daily - 7 x weekly - 3 sets - 10 reps - Seated Calf Stretch with Strap  - 1 x daily - 7 x weekly - 3 reps - 30 sec hold - Seated Long Arc Quad  - 1 x daily - 7 x weekly - 3 sets - 10 reps - Seated Hip Abduction with Resistance  - 1 x daily - 7 x weekly - 3 sets - 10 reps - red band hold  ASSESSMENT:  CLINICAL IMPRESSION: Patient is a 68 y.o. F who was seen today for physical therapy evaluation and treatment for LE weakness, gait instability, and imbalance due to chronic stroke. Physical findings are consistent with  MD impression as pt TUG and 30 Second Sit to Stand scores place her at a high risk for falls. Pt would benefit from skilled PT services working on improving strength, balance, and gait. PT will reach out to referring MD about referral to patient to [facility] for AFO fitting as well.    OBJECTIVE IMPAIRMENTS: Abnormal gait, decreased activity tolerance, decreased balance, decreased endurance, decreased mobility, difficulty walking, decreased ROM, decreased strength, impaired sensation, impaired tone, improper body mechanics, and pain   ACTIVITY LIMITATIONS: carrying, lifting, sitting, standing, squatting, stairs, transfers, reach over head, and locomotion level  PARTICIPATION LIMITATIONS: meal prep, laundry, driving, shopping, community activity, occupation, and yard work  PERSONAL FACTORS: Fitness, Time since onset of injury/illness/exacerbation, and 3+ comorbidities: CVA, HTN, spastic hemiplegia   are also affecting patient's functional outcome.   REHAB POTENTIAL: Fair - chronicity of symptoms and chronic stroke  CLINICAL DECISION MAKING: Evolving/moderate complexity  EVALUATION COMPLEXITY: Moderate   GOALS: Goals reviewed with patient? No  SHORT TERM GOALS: Target date: 05/26/2023   Pt will be compliant and knowledgeable with initial HEP for improved comfort and carryover Baseline: initial HEP given  Goal status: INITIAL  2.  Pt will self report back and L LE pain no greater than 5/10 for improved comfort and functional ability Baseline: 7/10 at worst Goal status: INITIAL   LONG TERM GOALS: Target date: 06/30/2023   Pt will self report back and L LE pain no greater than 2/10 for improved comfort and functional ability Baseline: 7/10 at worst Goal status: INITIAL   2.  Pt will decrease TUG to no less than 42 seconds for improved balance and safety with home and community navigation Baseline: 55 seconds with rollator and AFO Goal status: INITIAL   3.  Pt will increase 30  Second Sit to Stand rep count to no less than 6 reps for improved balance, strength, and functional mobility Baseline: 4 reps with UE Goal status: INITIAL   4.  Pt will  improve standing activity tolerance to no less than 20 minutes for improved ADL performance and indpendence Baseline: 10 minutes Goal status: INITIAL   PLAN:  PT FREQUENCY: 2x/week  PT DURATION: 8 weeks  PLANNED INTERVENTIONS: 97164- PT Re-evaluation, 97110-Therapeutic exercises, 97530- Therapeutic activity, [id number]- Neuromuscular re-education, 97535- Self Care, 96295- Manual therapy, [id number]- Gait training, [phone]- Electrical stimulation (unattended), [id number]- Electrical stimulation (manual), 97016- Vasopneumatic device, Cryotherapy, and Moist heat  PLAN FOR NEXT SESSION: LE strengthening, gait and transfers, AFO practice   Eloy End, PT 05/05/2023, 11:15 AM

## 2023-05-10 ENCOUNTER — Telehealth: Payer: Self-pay | Admitting: Physical Therapy

## 2023-05-10 ENCOUNTER — Ambulatory Visit: Payer: Medicare Other | Admitting: Physical Therapy

## 2023-05-10 NOTE — Telephone Encounter (Signed)
 Left voicemail regarding missed appointment and attendance policy. Left next appointment date and time.

## 2023-05-19 ENCOUNTER — Ambulatory Visit: Admitting: Physical Therapy

## 2023-05-19 ENCOUNTER — Other Ambulatory Visit: Payer: Self-pay

## 2023-05-19 ENCOUNTER — Ambulatory Visit: Payer: Medicare Other | Attending: Internal Medicine | Admitting: Physical Therapy

## 2023-05-19 ENCOUNTER — Encounter: Payer: Self-pay | Admitting: Physical Therapy

## 2023-05-19 DIAGNOSIS — R2681 Unsteadiness on feet: Secondary | ICD-10-CM | POA: Diagnosis present

## 2023-05-19 DIAGNOSIS — M6281 Muscle weakness (generalized): Secondary | ICD-10-CM | POA: Insufficient documentation

## 2023-05-19 DIAGNOSIS — R2689 Other abnormalities of gait and mobility: Secondary | ICD-10-CM

## 2023-05-19 NOTE — Patient Instructions (Signed)
 Access Code: [email or web address] URL: [email or web address] Date: 05/19/2023 Prepared by: Rosana Hoes  Exercises - Sit to Stand  - 1 x daily - 7 x weekly - 3 sets - 10 reps - Seated Calf Stretch with Strap  - 1 x daily - 7 x weekly - 3 reps - 30 sec hold - Seated Long Arc Quad  - 1 x daily - 7 x weekly - 3 sets - 20 reps - Seated March  - 1 x daily - 7 x weekly - 3 sets - 20 reps - Seated Heel Toe Raises  - 1 x daily - 7 x weekly - 3 sets - 20 reps - Seated Hip Abduction with Resistance  - 1 x daily - 7 x weekly - 3 sets - 20 reps - red band hold

## 2023-05-19 NOTE — Therapy (Signed)
 OUTPATIENT PHYSICAL THERAPY TREATMENT   Patient Name: [name] MRN: [id number] DOB:1956/01/30, 68 y.o., female Today's Date: 05/19/2023  END OF SESSION:  PT End of Session - 05/19/23 0754     Visit Number 2    Number of Visits 17    Date for PT Re-Evaluation 06/30/23    Authorization Type Medicare    Progress Note Due on Visit 10    PT Start Time 0845    PT Stop Time 0925    PT Time Calculation (min) 40 min    Activity Tolerance Patient tolerated treatment well    Behavior During Therapy [facility] for tasks assessed/performed             Past Medical History:  Diagnosis Date   Alcohol abuse    Chronic back pain    Colitis    CVA (cerebral infarction)    Fatty liver    Gastric ulcer    GI bleed    Hepatitis    Hepatitis C   MDD (major depressive disorder)    Pancreatitis    PTSD (post-traumatic stress disorder)    Seizures (HCC)    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) 11/05/2015   Stroke (HCC)    UTI (lower urinary tract infection)    Vision abnormalities    Past Surgical History:  Procedure Laterality Date   ABDOMINAL HYSTERECTOMY     APPENDECTOMY     COLONOSCOPY WITH PROPOFOL N/A 08/13/2015   Procedure: COLONOSCOPY WITH PROPOFOL;  Surgeon: Rachael Fee, MD;  Location: [facility]OSCOPY;  Service: Endoscopy;  Laterality: N/A;   ESOPHAGOGASTRODUODENOSCOPY N/A 05/23/2014   Procedure: ESOPHAGOGASTRODUODENOSCOPY (EGD);  Surgeon: Beverley Fiedler, MD;  Location: [facility]COPY;  Service: Endoscopy;  Laterality: N/A;   ESOPHAGOGASTRODUODENOSCOPY (EGD) WITH PROPOFOL N/A 08/13/2015   Procedure: ESOPHAGOGASTRODUODENOSCOPY (EGD) WITH PROPOFOL;  Surgeon: Rachael Fee, MD;  Location: [facility]OSCOPY;  Service: Endoscopy;  Laterality: N/A;   ESOPHAGOGASTRODUODENOSCOPY ENDOSCOPY     gall stone removed     Patient Active Problem List   Diagnosis Date Noted   Mallet toe, acquired, unspecified laterality 11/26/2021   Callus 08/23/2021   Pincer nail deformity 05/19/2021    Hypotension 01/28/2021   AKI (acute kidney injury) (HCC) 01/28/2021   Prolonged QT interval 01/28/2021   Influenza A 01/28/2021   Plantar fasciitis of right foot 01/15/2021   Hyperlipidemia 11/03/2016   Former smoker 01/11/2016   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/05/2015   Left hip pain    History of GI bleed    Spastic bladder    Chronic back pain    ETOH abuse    Hypokalemia    PTSD (post-traumatic stress disorder)    Gastroesophageal reflux disease without esophagitis    Acute ischemic stroke (HCC) 09/27/2015   Acute CVA (cerebrovascular accident) (HCC) 09/27/2015   Smoker    Left-sided weakness 09/26/2015   Acute gastric ulcer    GI bleed 05/22/2014   Acute GI bleeding 05/22/2014   Acute blood loss anemia 05/22/2014   History of stroke 05/22/2014   Mixed hyperlipidemia 05/22/2014   Gastrointestinal hemorrhage with melena    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) 10/09/2012   Flank pain 10/09/2012   Right pontine stroke (HCC) 10/09/2012   ABDOMINAL PAIN OTHER SPECIFIED SITE 05/17/2010   Alcohol abuse 06/16/2007   Cocaine abuse (HCC) 06/16/2007   DEPRESSION 06/16/2007   Essential hypertension 06/16/2007   Cerebral artery occlusion with  cerebral infarction (HCC) 06/16/2007   Asthma 06/16/2007   GERD 06/16/2007   PANCREATITIS 11/09/2006   ESOPHAGITIS, REFLUX 05/23/2006    PCP: Rometta Emery, MD  REFERRING PROVIDER: Rometta Emery, MD  REFERRING DIAG: muscle weakness of the lower extremities from a history of fibromyalgia   THERAPY DIAG:  Muscle weakness (generalized)  Other abnormalities of gait and mobility  Unsteadiness on feet  Rationale for Evaluation and Treatment: Rehabilitation  ONSET DATE: Chronic  SUBJECTIVE:   SUBJECTIVE STATEMENT: Patient reports she had a fall last night and hit her head. She states she was going to the bathroom and had on the wrong sock so  they slipped. She reports she didn't hurt herself, she just hit her head but denies any current headache or other symptoms. She does report she wants to see her foot doctor because of her toes and then she also was supposed to have an AFO ordered but hasn't heard from the doctor.    Eval: Pt presents to PT with niece who assists with subjective. Pt with hx of CVA with resultant L sided weakness in 2017, now presenting as chronic stroke with balance, gait, and strength impairments. Has had a few falls over past months and generally notes that she needs assistance with most higher level ADLs. Can only stand for at max 10 minutes before pain and fatigue which frustrates her greatly. She used to have an AFO on her L foot which helped but she is unable to use it anymore because it has worn out. She is interested about getting a new one.  PERTINENT HISTORY: CVA, HTN, spastic hemiplegia   PAIN:  Are you having pain?  Yes: NPRS scale: 2/10 Worst: 7/10 Pain location: L LE and lower back Pain description: numb, sore Aggravating factors: walking, standing Relieving factors: rest  PRECAUTIONS: Fall  RED FLAGS: None   WEIGHT BEARING RESTRICTIONS: No  FALLS:  Has patient fallen in last 6 months? Yes. Number of falls - 2 falls due to LoB while walking  LIVING ENVIRONMENT: Lives with: lives with their family Lives in: House/apartment Stairs: Yes: Internal: 12 steps; on right going up and External: 1 steps; none Has following equipment at home: Walker - 4 wheeled and shower chair  OCCUPATION: Not working  PLOF: Requires assistive device for independence and needs assistance with higher level   PATIENT GOALS: improve standing tolerance and balance  NEXT MD VISIT: PRN  OBJECTIVE:  Note: Objective measures were completed at Evaluation unless otherwise noted.  DIAGNOSTIC FINDINGS:   N/A  PATIENT SURVEYS:  None assessed at evaluation  COGNITION: Overall cognitive status: Within  functional limits for tasks assessed     SENSATION: Light touch: Impaired - L LE  EDEMA:  DNT  MUSCLE LENGTH: Hamstrings: DNT Thomas test: DNT  POSTURE: rounded shoulders, forward head, and flexed trunk   PALPATION: No overt TTP  LOWER EXTREMITY ROM:  Active ROM Right eval Left eval  Hip flexion    Hip extension    Hip abduction    Hip adduction    Hip internal rotation    Hip external rotation    Knee flexion    Knee extension    Ankle dorsiflexion 10 Lacking 9  Ankle plantarflexion    Ankle inversion    Ankle eversion     (Blank rows = not tested)  LOWER EXTREMITY MMT:  MMT Right eval Left eval  Hip flexion 3+/5 2+/5  Hip extension    Hip abduction 3+/5 2+/5  Hip adduction    Hip internal rotation    Hip external rotation    Knee flexion 3+/5 2+/5  Knee extension 3+/5 2+/5  Ankle dorsiflexion    Ankle plantarflexion    Ankle inversion    Ankle eversion     (Blank rows = not tested)  LOWER EXTREMITY SPECIAL TESTS:  DNT  FUNCTIONAL TESTS:  30 Second Sit to Stand: 4 reps with UE TUG: 55 sec with rollator and clinic AFO  GAIT: Distance walked: 52ft Assistive device utilized: Environmental consultant - 4 wheeled Level of assistance: SBA Comments: decrease L heel strike, L knee hyperextension   TREATMENT: [facility] Adult PT Treatment:                                                DATE: 05/19/2023 NuStep L3 x 5 min with UE/LE while taking subjective Gait training with AFO on left focused on improved stepping on left with foot clearance 3 x 50 ft using rollator Seated heel toe raises 2 x 20 Seated marching with 2# 2 x 20 LAQ with 2# 2 x 15 Sit to stand from elevated table with UE support 2 x 5 Standing march x 20   [facility] Adult PT Treatment:                                                  Therapeutic Exercise: STS x 5 Long sitting calf stretch x 30" L LAQ x 5 each Seated clamshell x 10 RTB  PATIENT EDUCATION:  Education details: HEP update Person educated:  Patient Education method: Explanation, Demonstration, and Handouts Education comprehension: verbalized understanding and returned demonstration  HOME EXERCISE PROGRAM: Access Code: [email or web address]   ASSESSMENT: CLINICAL IMPRESSION: Patient limited in therapy this visit due to gait deficits and weakness, no adverse effects reported. She did report a fall where she hit her head but denies any ongoing symptoms and seems to be at her baseline level. She does continue to exhibit significant gait deviations where she drags the left leg/foot due to difficulty lifting the left foot and leg with swing, and also left knee hyperextension with stance. Continue with gait training using AFO that did improve her gait but she still requires consistent cueing for hip flexion and larger steps on the left to avoid catching or dragging the left foot. Therapy focused on exercises to improve active ankle DF, knee and hip strengthening with good tolerance. Updated HEP to progress ankle and hip exercises for home. Patient would benefit form continued skilled PT to progress strength and walking ability in order to reduce fall risk and maximize functional ability.   Eval: Patient is a 68 y.o. F who was seen today for physical therapy evaluation and treatment for LE weakness, gait instability, and imbalance due to chronic stroke. Physical findings are consistent with MD impression as pt TUG and 30 Second Sit to Stand scores place her at a high risk for falls. Pt would benefit from skilled PT services working on improving strength, balance, and gait. PT will reach out to referring MD about referral to patient to [facility] for AFO fitting as well.    OBJECTIVE IMPAIRMENTS: Abnormal gait, decreased activity tolerance, decreased balance, decreased endurance, decreased mobility, difficulty  walking, decreased ROM, decreased strength, impaired sensation, impaired tone, improper body mechanics, and pain   ACTIVITY LIMITATIONS: carrying,  lifting, sitting, standing, squatting, stairs, transfers, reach over head, and locomotion level  PARTICIPATION LIMITATIONS: meal prep, laundry, driving, shopping, community activity, occupation, and yard work  PERSONAL FACTORS: Fitness, Time since onset of injury/illness/exacerbation, and 3+ comorbidities: CVA, HTN, spastic hemiplegia   are also affecting patient's functional outcome.    GOALS: Goals reviewed with patient? No  SHORT TERM GOALS: Target date: 05/26/2023   Pt will be compliant and knowledgeable with initial HEP for improved comfort and carryover Baseline: initial HEP given  Goal status: INITIAL  2.  Pt will self report back and L LE pain no greater than 5/10 for improved comfort and functional ability Baseline: 7/10 at worst Goal status: INITIAL   LONG TERM GOALS: Target date: 06/30/2023   Pt will self report back and L LE pain no greater than 2/10 for improved comfort and functional ability Baseline: 7/10 at worst Goal status: INITIAL   2.  Pt will decrease TUG to no less than 42 seconds for improved balance and safety with home and community navigation Baseline: 55 seconds with rollator and AFO Goal status: INITIAL   3.  Pt will increase 30 Second Sit to Stand rep count to no less than 6 reps for improved balance, strength, and functional mobility Baseline: 4 reps with UE Goal status: INITIAL   4.  Pt will improve standing activity tolerance to no less than 20 minutes for improved ADL performance and indpendence Baseline: 10 minutes Goal status: INITIAL   PLAN: PT FREQUENCY: 2x/week  PT DURATION: 8 weeks  PLANNED INTERVENTIONS: 97164- PT Re-evaluation, 97110-Therapeutic exercises, 97530- Therapeutic activity, [id number]- Neuromuscular re-education, 97535- Self Care, [id number]- Manual therapy, [id number]- Gait training, [phone]- Electrical stimulation (unattended), [id number]- Electrical stimulation (manual), 97016- Vasopneumatic device, Cryotherapy, and Moist heat  PLAN FOR  NEXT SESSION: LE strengthening, gait and transfers, AFO practice   Rosana Hoes, PT, DPT, LAT, ATC 05/19/23  9:31 AM Phone: [phone] Fax: [phone]

## 2023-05-29 ENCOUNTER — Ambulatory Visit: Payer: Medicare Other

## 2023-05-29 DIAGNOSIS — R2689 Other abnormalities of gait and mobility: Secondary | ICD-10-CM

## 2023-05-29 DIAGNOSIS — M6281 Muscle weakness (generalized): Secondary | ICD-10-CM | POA: Diagnosis not present

## 2023-05-29 DIAGNOSIS — R2681 Unsteadiness on feet: Secondary | ICD-10-CM

## 2023-05-29 NOTE — Therapy (Signed)
 OUTPATIENT PHYSICAL THERAPY TREATMENT   Patient Name: [name] MRN: [id number] DOB:[birth year], 68 y.o., female Today's Date: 05/29/2023  END OF SESSION:  PT End of Session - 05/29/23 1023     Visit Number 3    Number of Visits 17    Date for PT Re-Evaluation 06/30/23    Authorization Type Medicare    Progress Note Due on Visit 10    PT Start Time 1023    PT Stop Time 1058    PT Time Calculation (min) 35 min    Activity Tolerance Patient tolerated treatment well    Behavior During Therapy WFL for tasks assessed/performed              Past Medical History:  Diagnosis Date   Alcohol abuse    Chronic back pain    Colitis    CVA (cerebral infarction)    Fatty liver    Gastric ulcer    GI bleed    Hepatitis    Hepatitis C   MDD (major depressive disorder)    Pancreatitis    PTSD (post-traumatic stress disorder)    Seizures (HCC)    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) 11/05/2015   Stroke (HCC)    UTI (lower urinary tract infection)    Vision abnormalities    Past Surgical History:  Procedure Laterality Date   ABDOMINAL HYSTERECTOMY     APPENDECTOMY     COLONOSCOPY WITH PROPOFOL N/A 08/13/2015   Procedure: COLONOSCOPY WITH PROPOFOL;  Surgeon: Rachael Fee, MD;  Location: [facility];  Service: Endoscopy;  Laterality: N/A;   ESOPHAGOGASTRODUODENOSCOPY N/A 05/23/2014   Procedure: ESOPHAGOGASTRODUODENOSCOPY (EGD);  Surgeon: Beverley Fiedler, MD;  Location: [facility] ENDOSCOPY;  Service: Endoscopy;  Laterality: N/A;   ESOPHAGOGASTRODUODENOSCOPY (EGD) WITH PROPOFOL N/A 08/13/2015   Procedure: ESOPHAGOGASTRODUODENOSCOPY (EGD) WITH PROPOFOL;  Surgeon: Rachael Fee, MD;  Location: [facility];  Service: Endoscopy;  Laterality: N/A;   ESOPHAGOGASTRODUODENOSCOPY ENDOSCOPY     gall stone removed     Patient Active Problem List   Diagnosis Date Noted   Mallet toe, acquired, unspecified laterality 11/26/2021   Callus 08/23/2021   Pincer nail deformity  05/19/2021   Hypotension 01/28/2021   AKI (acute kidney injury) (HCC) 01/28/2021   Prolonged QT interval 01/28/2021   Influenza A 01/28/2021   Plantar fasciitis of right foot 01/15/2021   Hyperlipidemia 11/03/2016   Former smoker 01/11/2016   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/05/2015   Left hip pain    History of GI bleed    Spastic bladder    Chronic back pain    ETOH abuse    Hypokalemia    PTSD (post-traumatic stress disorder)    Gastroesophageal reflux disease without esophagitis    Acute ischemic stroke (HCC) 09/27/2015   Acute CVA (cerebrovascular accident) (HCC) 09/27/2015   Smoker    Left-sided weakness 09/26/2015   Acute gastric ulcer    GI bleed 05/22/2014   Acute GI bleeding 05/22/2014   Acute blood loss anemia 05/22/2014   History of stroke 05/22/2014   Mixed hyperlipidemia 05/22/2014   Gastrointestinal hemorrhage with melena    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) 10/09/2012   Flank pain 10/09/2012   Right pontine stroke (HCC) 10/09/2012   ABDOMINAL PAIN OTHER SPECIFIED SITE 05/17/2010   Alcohol abuse 06/16/2007   Cocaine abuse (HCC) 06/16/2007   DEPRESSION 06/16/2007   Essential hypertension 06/16/2007   Cerebral artery occlusion  with cerebral infarction (HCC) 06/16/2007   Asthma 06/16/2007   GERD 06/16/2007   PANCREATITIS 11/09/2006   ESOPHAGITIS, REFLUX 05/23/2006    PCP: Rometta Emery, MD  REFERRING PROVIDER: Rometta Emery, MD  REFERRING DIAG: muscle weakness of the lower extremities from a history of fibromyalgia   THERAPY DIAG:  Muscle weakness (generalized)  Other abnormalities of gait and mobility  Unsteadiness on feet  Rationale for Evaluation and Treatment: Rehabilitation  ONSET DATE: Chronic  SUBJECTIVE:   SUBJECTIVE STATEMENT: Pt presents to PT with no current pain, denies falls since last session. Has been fairly compliant with HEP.    Eval:  Pt presents to PT with niece who assists with subjective. Pt with hx of CVA with resultant L sided weakness in 2017, now presenting as chronic stroke with balance, gait, and strength impairments. Has had a few falls over past months and generally notes that she needs assistance with most higher level ADLs. Can only stand for at max 10 minutes before pain and fatigue which frustrates her greatly. She used to have an AFO on her L foot which helped but she is unable to use it anymore because it has worn out. She is interested about getting a new one.  PERTINENT HISTORY: CVA, HTN, spastic hemiplegia   PAIN:  Are you having pain?  Yes: NPRS scale: 2/10 Worst: 7/10 Pain location: L LE and lower back Pain description: numb, sore Aggravating factors: walking, standing Relieving factors: rest  PRECAUTIONS: Fall  RED FLAGS: None   WEIGHT BEARING RESTRICTIONS: No  FALLS:  Has patient fallen in last 6 months? Yes. Number of falls - 2 falls due to LoB while walking  LIVING ENVIRONMENT: Lives with: lives with their family Lives in: House/apartment Stairs: Yes: Internal: 12 steps; on right going up and External: 1 steps; none Has following equipment at home: Walker - 4 wheeled and shower chair  OCCUPATION: Not working  PLOF: Requires assistive device for independence and needs assistance with higher level   PATIENT GOALS: improve standing tolerance and balance  NEXT MD VISIT: PRN  OBJECTIVE:  Note: Objective measures were completed at Evaluation unless otherwise noted.  DIAGNOSTIC FINDINGS:   N/A  PATIENT SURVEYS:  None assessed at evaluation  COGNITION: Overall cognitive status: Within functional limits for tasks assessed     SENSATION: Light touch: Impaired - L LE  EDEMA:  DNT  MUSCLE LENGTH: Hamstrings: DNT Thomas test: DNT  POSTURE: rounded shoulders, forward head, and flexed trunk   PALPATION: No overt TTP  LOWER EXTREMITY ROM:  Active ROM Right eval  Left eval  Hip flexion    Hip extension    Hip abduction    Hip adduction    Hip internal rotation    Hip external rotation    Knee flexion    Knee extension    Ankle dorsiflexion 10 Lacking 9  Ankle plantarflexion    Ankle inversion    Ankle eversion     (Blank rows = not tested)  LOWER EXTREMITY MMT:  MMT Right eval Left eval  Hip flexion 3+/5 2+/5  Hip extension    Hip abduction 3+/5 2+/5  Hip adduction    Hip internal rotation    Hip external rotation    Knee flexion 3+/5 2+/5  Knee extension 3+/5 2+/5  Ankle dorsiflexion    Ankle plantarflexion    Ankle inversion    Ankle eversion     (Blank rows = not tested)  LOWER EXTREMITY SPECIAL TESTS:  DNT  FUNCTIONAL TESTS:  30 Second Sit to Stand: 4 reps with UE TUG: 55 sec with rollator and clinic AFO  GAIT: Distance walked: 10ft Assistive device utilized: Environmental consultant - 4 wheeled Level of assistance: SBA Comments: decrease L heel strike, L knee hyperextension   TREATMENT: [facility] Adult PT Treatment:                                                  Seated calf stretch with strap 2x45" L LAQ 2x10 2# STS 2x5 with UE Gait training with AFO on left focused on improved stepping on left with foot clearance x 49ft using rollator Seated toe raises 2x20 Seated hamstring stretch 3x30" L Sit to stand from elevated table with UE support 2 x 5  PATIENT EDUCATION:  Education details: HEP update Person educated: Patient Education method: Explanation, Demonstration, and Handouts Education comprehension: verbalized understanding and returned demonstration  HOME EXERCISE PROGRAM: Access Code: [email or web address] URL: [email or web address] Date: 05/29/2023 Prepared by: Edwinna Areola  Exercises - Sit to Stand  - 1 x daily - 7 x weekly - 3 sets - 10 reps - Seated Calf Stretch with Strap  - 1 x daily - 7 x weekly - 3 reps - 30 sec hold - Seated Long Arc Quad  - 1 x daily - 7 x weekly - 3 sets - 20 reps - Seated March  - 1  x daily - 7 x weekly - 3 sets - 20 reps - Seated Heel Toe Raises  - 1 x daily - 7 x weekly - 3 sets - 20 reps - Seated Hip Abduction with Resistance  - 1 x daily - 7 x weekly - 3 sets - 20 reps - red band hold - Seated Toe Raise  - 1 x daily - 7 x weekly - 2 sets - 20 reps   ASSESSMENT: CLINICAL IMPRESSION: Pt was able to complete prescribed exercises with no adverse effect. Exercises today focused on improving gait and L LE deficits. Pt is progressing fair with therapy, does need to be fitted for custom AFO for L LE. Pt will benefit from continued PT, will continue to progress as able per POC.   Eval: Patient is a 67 y.o. F who was seen today for physical therapy evaluation and treatment for LE weakness, gait instability, and imbalance due to chronic stroke. Physical findings are consistent with MD impression as pt TUG and 30 Second Sit to Stand scores place her at a high risk for falls. Pt would benefit from skilled PT services working on improving strength, balance, and gait. PT will reach out to referring MD about referral to patient to [facility] for AFO fitting as well.    OBJECTIVE IMPAIRMENTS: Abnormal gait, decreased activity tolerance, decreased balance, decreased endurance, decreased mobility, difficulty walking, decreased ROM, decreased strength, impaired sensation, impaired tone, improper body mechanics, and pain   ACTIVITY LIMITATIONS: carrying, lifting, sitting, standing, squatting, stairs, transfers, reach over head, and locomotion level  PARTICIPATION LIMITATIONS: meal prep, laundry, driving, shopping, community activity, occupation, and yard work  PERSONAL FACTORS: Fitness, Time since onset of injury/illness/exacerbation, and 3+ comorbidities: CVA, HTN, spastic hemiplegia   are also affecting patient's functional outcome.    GOALS: Goals reviewed with patient? No  SHORT TERM GOALS: Target date: 05/26/2023   Pt will be compliant and knowledgeable with initial HEP for  improved comfort and carryover Baseline: initial HEP given  Goal status: INITIAL  2.  Pt will self report back and L LE pain no greater than 5/10 for improved comfort and functional ability Baseline: 7/10 at worst Goal status: INITIAL   LONG TERM GOALS: Target date: 06/30/2023   Pt will self report back and L LE pain no greater than 2/10 for improved comfort and functional ability Baseline: 7/10 at worst Goal status: INITIAL   2.  Pt will decrease TUG to no less than 42 seconds for improved balance and safety with home and community navigation Baseline: 55 seconds with rollator and AFO Goal status: INITIAL   3.  Pt will increase 30 Second Sit to Stand rep count to no less than 6 reps for improved balance, strength, and functional mobility Baseline: 4 reps with UE Goal status: INITIAL   4.  Pt will improve standing activity tolerance to no less than 20 minutes for improved ADL performance and indpendence Baseline: 10 minutes Goal status: INITIAL   PLAN: PT FREQUENCY: 2x/week  PT DURATION: 8 weeks  PLANNED INTERVENTIONS: 97164- PT Re-evaluation, 97110-Therapeutic exercises, 97530- Therapeutic activity, [id number]- Neuromuscular re-education, 97535- Self Care, [id number]- Manual therapy, [id number]- Gait training, K7425- Electrical stimulation (unattended), [id number]- Electrical stimulation (manual), 97016- Vasopneumatic device, Cryotherapy, and Moist heat  PLAN FOR NEXT SESSION: LE strengthening, gait and transfers, AFO practice   Eloy End PT  05/29/23 12:49 PM

## 2023-06-02 ENCOUNTER — Ambulatory Visit: Payer: Medicare Other | Admitting: Physical Therapy

## 2023-06-02 ENCOUNTER — Encounter: Payer: Self-pay | Admitting: Physical Therapy

## 2023-06-02 DIAGNOSIS — M6281 Muscle weakness (generalized): Secondary | ICD-10-CM | POA: Diagnosis not present

## 2023-06-02 DIAGNOSIS — R2689 Other abnormalities of gait and mobility: Secondary | ICD-10-CM

## 2023-06-02 DIAGNOSIS — R2681 Unsteadiness on feet: Secondary | ICD-10-CM

## 2023-06-02 NOTE — Therapy (Signed)
 OUTPATIENT PHYSICAL THERAPY TREATMENT   Patient Name: [name] MRN: [id number] DOB:1955-09-25, 68 y.o., female Today's Date: 06/02/2023  END OF SESSION:  PT End of Session - 06/02/23 1144     Visit Number 4    Number of Visits 17    Date for PT Re-Evaluation 06/30/23    Authorization Type Medicare    Progress Note Due on Visit 10    PT Start Time 1145    PT Stop Time 1230    PT Time Calculation (min) 45 min              Past Medical History:  Diagnosis Date   Alcohol abuse    Chronic back pain    Colitis    CVA (cerebral infarction)    Fatty liver    Gastric ulcer    GI bleed    Hepatitis    Hepatitis C   MDD (major depressive disorder)    Pancreatitis    PTSD (post-traumatic stress disorder)    Seizures (HCC)    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) 11/05/2015   Stroke (HCC)    UTI (lower urinary tract infection)    Vision abnormalities    Past Surgical History:  Procedure Laterality Date   ABDOMINAL HYSTERECTOMY     APPENDECTOMY     COLONOSCOPY WITH PROPOFOL N/A 08/13/2015   Procedure: COLONOSCOPY WITH PROPOFOL;  Surgeon: Alexa Fee, MD;  Location: [facility] END[facility]COPY;  Service: Endoscopy;  Laterality: N/A;   ESOPHAGOGASTRODUODENOSCOPY N/A 05/23/2014   Procedure: ESOPHAGOGASTRODUODENOSCOPY (EGD);  Surgeon: Alexa Fiedler, MD;  Location: [facility] ENDOSCOPY;  Service: Endoscopy;  Laterality: N/A;   ESOPHAGOGASTRODUODENOSCOPY (EGD) WITH PROPOFOL N/A 08/13/2015   Procedure: ESOPHAGOGASTRODUODENOSCOPY (EGD) WITH PROPOFOL;  Surgeon: Alexa Fee, MD;  Location: [facility] ENDOSCOPY;  Service: Endoscopy;  Laterality: N/A;   ESOPHAGOGASTRODUODENOSCOPY ENDOSCOPY     gall stone removed     Patient Active Problem List   Diagnosis Date Noted   Mallet toe, acquired, unspecified laterality 11/26/2021   Callus 08/23/2021   Pincer nail deformity 05/19/2021   Hypotension 01/28/2021   AKI (acute kidney injury) (HCC) 01/28/2021   Prolonged QT interval 01/28/2021    Influenza A 01/28/2021   Plantar fasciitis of right foot 01/15/2021   Hyperlipidemia 11/03/2016   Former smoker 01/11/2016   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/05/2015   Left hip pain    History of GI bleed    Spastic bladder    Chronic back pain    ETOH abuse    Hypokalemia    PTSD (post-traumatic stress disorder)    Gastroesophageal reflux disease without esophagitis    Acute ischemic stroke (HCC) 09/27/2015   Acute CVA (cerebrovascular accident) (HCC) 09/27/2015   Smoker    Left-sided weakness 09/26/2015   Acute gastric ulcer    GI bleed 05/22/2014   Acute GI bleeding 05/22/2014   Acute blood loss anemia 05/22/2014   History of stroke 05/22/2014   Mixed hyperlipidemia 05/22/2014   Gastrointestinal hemorrhage with melena    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) 10/09/2012   Flank pain 10/09/2012   Right pontine stroke (HCC) 10/09/2012   ABDOMINAL PAIN OTHER SPECIFIED SITE 05/17/2010   Alcohol abuse 06/16/2007   Cocaine abuse (HCC) 06/16/2007   DEPRESSION 06/16/2007   Essential hypertension 06/16/2007   Cerebral artery occlusion with cerebral infarction (HCC) 06/16/2007   Asthma 06/16/2007   GERD 06/16/2007   PANCREATITIS 11/09/2006  ESOPHAGITIS, REFLUX 05/23/2006    PCP: Alexa Emery, MD  REFERRING PROVIDER: Rometta Emery, MD  REFERRING DIAG: muscle weakness of the lower extremities from a history of fibromyalgia   THERAPY DIAG:  Muscle weakness (generalized)  Other abnormalities of gait and mobility  Unsteadiness on feet  Rationale for Evaluation and Treatment: Rehabilitation  ONSET DATE: Chronic  SUBJECTIVE:   SUBJECTIVE STATEMENT: I slid off the bed last night trying to go to the bed side commode. My niece helped me up. Has been fairly compliant with HEP.    Eval: Pt presents to PT with niece who assists with subjective. Pt with hx of CVA with resultant L sided  weakness in 2017, now presenting as chronic stroke with balance, gait, and strength impairments. Has had a few falls over past months and generally notes that she needs assistance with most higher level ADLs. Can only stand for at max 10 minutes before pain and fatigue which frustrates her greatly. She used to have an AFO on her L foot which helped but she is unable to use it anymore because it has worn out. She is interested about getting a new one.  PERTINENT HISTORY: CVA, HTN, spastic hemiplegia   PAIN:  Are you having pain?  Yes: NPRS scale: 2/10 Worst: 7/10 Pain location: L LE and lower back Pain description: numb, sore Aggravating factors: walking, standing Relieving factors: rest  PRECAUTIONS: Fall  RED FLAGS: None   WEIGHT BEARING RESTRICTIONS: No  FALLS:  Has patient fallen in last 6 months? Yes. Number of falls - 2 falls due to LoB while walking  LIVING ENVIRONMENT: Lives with: lives with their family Lives in: House/apartment Stairs: Yes: Internal: 12 steps; on right going up and External: 1 steps; none Has following equipment at home: Walker - 4 wheeled and shower chair  OCCUPATION: Not working  PLOF: Requires assistive device for independence and needs assistance with higher level   PATIENT GOALS: improve standing tolerance and balance  NEXT MD VISIT: PRN  OBJECTIVE:  Note: Objective measures were completed at Evaluation unless otherwise noted.  DIAGNOSTIC FINDINGS:   N/A  PATIENT SURVEYS:  None assessed at evaluation  COGNITION: Overall cognitive status: Within functional limits for tasks assessed     SENSATION: Light touch: Impaired - L LE  EDEMA:  DNT  MUSCLE LENGTH: Hamstrings: DNT Thomas test: DNT  POSTURE: rounded shoulders, forward head, and flexed trunk   PALPATION: No overt TTP  LOWER EXTREMITY ROM:  Active ROM Right eval Left eval  Hip flexion    Hip extension    Hip abduction    Hip adduction    Hip internal rotation     Hip external rotation    Knee flexion    Knee extension    Ankle dorsiflexion 10 Lacking 9  Ankle plantarflexion    Ankle inversion    Ankle eversion     (Blank rows = not tested)  LOWER EXTREMITY MMT:  MMT Right eval Left eval  Hip flexion 3+/5 2+/5  Hip extension    Hip abduction 3+/5 2+/5  Hip adduction    Hip internal rotation    Hip external rotation    Knee flexion 3+/5 2+/5  Knee extension 3+/5 2+/5  Ankle dorsiflexion    Ankle plantarflexion    Ankle inversion    Ankle eversion     (Blank rows = not tested)  LOWER EXTREMITY SPECIAL TESTS:  DNT  FUNCTIONAL TESTS:  30 Second Sit to Stand: 4 reps  with UE TUG: 55 sec with rollator and clinic AFO  GAIT: Distance walked: 24ft Assistive device utilized: Environmental consultant - 4 wheeled Level of assistance: SBA Comments: decrease L heel strike, L knee hyperextension   TREATMENT: [facility] Adult PT Treatment:                                                DATE: 06/02/23 Therapeutic Exercise: Seated calf stretch with strap Seated LAQ -assist on left with strap  Therapeutic Activity: Nustep L1 UE/LE for LE ROM  STS <-> Stand to Sit transfers x 10 with cues for safety with Rolator Gait Training : 115 feet x 2  with Rolator (Rolator Raised one level) , x 1 with RW both with clinic's AFO donned LLE , with cues for improved step length and knee flexion- pt requires constant cues for trunk extension, distance from Rolator and step length.      [facility] Adult PT Treatment:                    05/29/23                               Seated calf stretch with strap 2x45" L LAQ 2x10 2# STS 2x5 with UE Gait training with AFO on left focused on improved stepping on left with foot clearance x 45ft using rollator Seated toe raises 2x20 Seated hamstring stretch 3x30" L Sit to stand from elevated table with UE support 2 x 5  PATIENT EDUCATION:  Education details: HEP update Person educated: Patient Education method: Explanation,  Demonstration, and Handouts Education comprehension: verbalized understanding and returned demonstration  HOME EXERCISE PROGRAM: Access Code: [email or web address] URL: [email or web address] Date: 05/29/2023 Prepared by: Edwinna Areola  Exercises - Sit to Stand  - 1 x daily - 7 x weekly - 3 sets - 10 reps - Seated Calf Stretch with Strap  - 1 x daily - 7 x weekly - 3 reps - 30 sec hold - Seated Long Arc Quad  - 1 x daily - 7 x weekly - 3 sets - 20 reps - Seated March  - 1 x daily - 7 x weekly - 3 sets - 20 reps - Seated Heel Toe Raises  - 1 x daily - 7 x weekly - 3 sets - 20 reps - Seated Hip Abduction with Resistance  - 1 x daily - 7 x weekly - 3 sets - 20 reps - red band hold - Seated Toe Raise  - 1 x daily - 7 x weekly - 2 sets - 20 reps   ASSESSMENT: CLINICAL IMPRESSION: Pt was able to complete prescribed exercises with no adverse effect. Exercises today focused on improving gait and L LE deficits. Pt is progressing fair with therapy, does need to be fitted for custom AFO for L LE, niece brought correct MD fax number so a request for referral will be sent today by primary PT.  Increased distance today tolerated well. She requires constant cues for trunk extension, distance from Rolator with gait and transfers, as well as step length with gait. Trial of FW RW in clinic for increased support, pt prefers her Rolator. She is able to squeeze her brakes as needed to prevent Rolator from getting away from her. FW RW would be safer.   Pt  will benefit from continued PT, will continue to progress as able per POC.   Eval: Patient is a 68 y.o. F who was seen today for physical therapy evaluation and treatment for LE weakness, gait instability, and imbalance due to chronic stroke. Physical findings are consistent with MD impression as pt TUG and 30 Second Sit to Stand scores place her at a high risk for falls. Pt would benefit from skilled PT services working on improving strength, balance, and gait. PT  will reach out to referring MD about referral to patient to [facility] for AFO fitting as well.    OBJECTIVE IMPAIRMENTS: Abnormal gait, decreased activity tolerance, decreased balance, decreased endurance, decreased mobility, difficulty walking, decreased ROM, decreased strength, impaired sensation, impaired tone, improper body mechanics, and pain   ACTIVITY LIMITATIONS: carrying, lifting, sitting, standing, squatting, stairs, transfers, reach over head, and locomotion level  PARTICIPATION LIMITATIONS: meal prep, laundry, driving, shopping, community activity, occupation, and yard work  PERSONAL FACTORS: Fitness, Time since onset of injury/illness/exacerbation, and 3+ comorbidities: CVA, HTN, spastic hemiplegia   are also affecting patient's functional outcome.    GOALS: Goals reviewed with patient? No  SHORT TERM GOALS: Target date: 05/26/2023   Pt will be compliant and knowledgeable with initial HEP for improved comfort and carryover Baseline: initial HEP given  Goal status: INITIAL  2.  Pt will self report back and L LE pain no greater than 5/10 for improved comfort and functional ability Baseline: 7/10 at worst Goal status: INITIAL   LONG TERM GOALS: Target date: 06/30/2023   Pt will self report back and L LE pain no greater than 2/10 for improved comfort and functional ability Baseline: 7/10 at worst Goal status: INITIAL   2.  Pt will decrease TUG to no less than 42 seconds for improved balance and safety with home and community navigation Baseline: 55 seconds with rollator and AFO Goal status: INITIAL   3.  Pt will increase 30 Second Sit to Stand rep count to no less than 6 reps for improved balance, strength, and functional mobility Baseline: 4 reps with UE Goal status: INITIAL   4.  Pt will improve standing activity tolerance to no less than 20 minutes for improved ADL performance and indpendence Baseline: 10 minutes Goal status: INITIAL   PLAN: PT FREQUENCY:  2x/week  PT DURATION: 8 weeks  PLANNED INTERVENTIONS: 97164- PT Re-evaluation, 97110-Therapeutic exercises, 97530- Therapeutic activity, [id number]- Neuromuscular re-education, 97535- Self Care, [id number]- Manual therapy, [id number]- Gait training, [phone]- Electrical stimulation (unattended), [id number]- Electrical stimulation (manual), 97016- Vasopneumatic device, Cryotherapy, and Moist heat  PLAN FOR NEXT SESSION: LE strengthening, gait and transfers, AFO practice   Jannette Spanner, PTA 06/02/23 12:52 PM Phone: [phone] Fax: [phone]

## 2023-06-21 NOTE — Therapy (Signed)
 OUTPATIENT PHYSICAL THERAPY TREATMENT   Patient Name: [name] MRN: [id number] DOB:September 10, 1955, 68 y.o., female Today's Date: 06/21/2023  END OF SESSION:     Past Medical History:  Diagnosis Date   Alcohol abuse    Chronic back pain    Colitis    CVA (cerebral infarction)    Fatty liver    Gastric ulcer    GI bleed    Hepatitis    Hepatitis C   MDD (major depressive disorder)    Pancreatitis    PTSD (post-traumatic stress disorder)    Seizures (HCC)    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) 11/05/2015   Stroke (HCC)    UTI (lower urinary tract infection)    Vision abnormalities    Past Surgical History:  Procedure Laterality Date   ABDOMINAL HYSTERECTOMY     APPENDECTOMY     COLONOSCOPY WITH PROPOFOL N/A 08/13/2015   Procedure: COLONOSCOPY WITH PROPOFOL;  Surgeon: Rachael Fee, MD;  Location: [facility] ENDOSCOPY;  Service: Endoscopy;  Laterality: N/A;   ESOPHAGOGASTRODUODENOSCOPY N/A 05/23/2014   Procedure: ESOPHAGOGASTRODUODENOSCOPY (EGD);  Surgeon: Beverley Fiedler, MD;  Location: [facility] ENDOSCOPY;  Service: Endoscopy;  Laterality: N/A;   ESOPHAGOGASTRODUODENOSCOPY (EGD) WITH PROPOFOL N/A 08/13/2015   Procedure: ESOPHAGOGASTRODUODENOSCOPY (EGD) WITH PROPOFOL;  Surgeon: Rachael Fee, MD;  Location: [facility] ENDOSCOPY;  Service: Endoscopy;  Laterality: N/A;   ESOPHAGOGASTRODUODENOSCOPY ENDOSCOPY     gall stone removed     Patient Active Problem List   Diagnosis Date Noted   Mallet toe, acquired, unspecified laterality 11/26/2021   Callus 08/23/2021   Pincer nail deformity 05/19/2021   Hypotension 01/28/2021   AKI (acute kidney injury) (HCC) 01/28/2021   Prolonged QT interval 01/28/2021   Influenza A 01/28/2021   Plantar fasciitis of right foot 01/15/2021   Hyperlipidemia 11/03/2016   Former smoker 01/11/2016   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/05/2015   Left hip pain    History of GI bleed    Spastic bladder    Chronic back  pain    ETOH abuse    Hypokalemia    PTSD (post-traumatic stress disorder)    Gastroesophageal reflux disease without esophagitis    Acute ischemic stroke (HCC) 09/27/2015   Acute CVA (cerebrovascular accident) (HCC) 09/27/2015   Smoker    Left-sided weakness 09/26/2015   Acute gastric ulcer    GI bleed 05/22/2014   Acute GI bleeding 05/22/2014   Acute blood loss anemia 05/22/2014   History of stroke 05/22/2014   Mixed hyperlipidemia 05/22/2014   Gastrointestinal hemorrhage with melena    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) 10/09/2012   Flank pain 10/09/2012   Right pontine stroke (HCC) 10/09/2012   ABDOMINAL PAIN OTHER SPECIFIED SITE 05/17/2010   Alcohol abuse 06/16/2007   Cocaine abuse (HCC) 06/16/2007   DEPRESSION 06/16/2007   Essential hypertension 06/16/2007   Cerebral artery occlusion with cerebral infarction (HCC) 06/16/2007   Asthma 06/16/2007   GERD 06/16/2007   PANCREATITIS 11/09/2006   ESOPHAGITIS, REFLUX 05/23/2006    PCP: Rometta Emery, MD  REFERRING PROVIDER: Rometta Emery, MD  REFERRING DIAG: muscle weakness of the lower extremities from a history of fibromyalgia   THERAPY DIAG:  No diagnosis found.  Rationale for Evaluation and Treatment: Rehabilitation  ONSET DATE: Chronic  SUBJECTIVE:   SUBJECTIVE STATEMENT: 06/21/2023 ***  *** I slid off the bed last night trying to go to the bed side commode. My  niece helped me up. Has been fairly compliant with HEP.    Eval: Pt presents to PT with niece who assists with subjective. Pt with hx of CVA with resultant L sided weakness in 2017, now presenting as chronic stroke with balance, gait, and strength impairments. Has had a few falls over past months and generally notes that she needs assistance with most higher level ADLs. Can only stand for at max 10 minutes before pain and fatigue which frustrates her greatly. She used to have an AFO on her L foot which helped  but she is unable to use it anymore because it has worn out. She is interested about getting a new one.  PERTINENT HISTORY: CVA, HTN, spastic hemiplegia   PAIN:  Are you having pain?  Yes: NPRS scale: 2/10 Worst: 7/10 Pain location: L LE and lower back Pain description: numb, sore Aggravating factors: walking, standing Relieving factors: rest  PRECAUTIONS: Fall  RED FLAGS: None   WEIGHT BEARING RESTRICTIONS: No  FALLS:  Has patient fallen in last 6 months? Yes. Number of falls - 2 falls due to LoB while walking  LIVING ENVIRONMENT: Lives with: lives with their family Lives in: House/apartment Stairs: Yes: Internal: 12 steps; on right going up and External: 1 steps; none Has following equipment at home: Walker - 4 wheeled and shower chair  OCCUPATION: Not working  PLOF: Requires assistive device for independence and needs assistance with higher level   PATIENT GOALS: improve standing tolerance and balance  NEXT MD VISIT: PRN  OBJECTIVE:  Note: Objective measures were completed at Evaluation unless otherwise noted.  DIAGNOSTIC FINDINGS:   N/A  PATIENT SURVEYS:  None assessed at evaluation  COGNITION: Overall cognitive status: Within functional limits for tasks assessed     SENSATION: Light touch: Impaired - L LE  EDEMA:  DNT  MUSCLE LENGTH: Hamstrings: DNT Thomas test: DNT  POSTURE: rounded shoulders, forward head, and flexed trunk   PALPATION: No overt TTP  LOWER EXTREMITY ROM:  Active ROM Right eval Left eval  Hip flexion    Hip extension    Hip abduction    Hip adduction    Hip internal rotation    Hip external rotation    Knee flexion    Knee extension    Ankle dorsiflexion 10 Lacking 9  Ankle plantarflexion    Ankle inversion    Ankle eversion     (Blank rows = not tested)  LOWER EXTREMITY MMT:  MMT Right eval Left eval  Hip flexion 3+/5 2+/5  Hip extension    Hip abduction 3+/5 2+/5  Hip adduction    Hip internal  rotation    Hip external rotation    Knee flexion 3+/5 2+/5  Knee extension 3+/5 2+/5  Ankle dorsiflexion    Ankle plantarflexion    Ankle inversion    Ankle eversion     (Blank rows = not tested)  LOWER EXTREMITY SPECIAL TESTS:  DNT  FUNCTIONAL TESTS:  30 Second Sit to Stand: 4 reps with UE TUG: 55 sec with rollator and clinic AFO  GAIT: Distance walked: 71ft Assistive device utilized: Environmental consultant - 4 wheeled Level of assistance: SBA Comments: decrease L heel strike, L knee hyperextension   TREATMENT: [facility] Adult PT Treatment:  DATE: 06/22/23 Therapeutic Exercise: *** Manual Therapy: *** Neuromuscular re-ed: *** Therapeutic Activity: *** Modalities: *** Self Care: ***    [facility] PT Treatment:                                                DATE: 06/02/23 Therapeutic Exercise: Seated calf stretch with strap Seated LAQ -assist on left with strap  Therapeutic Activity: Nustep L1 UE/LE for LE ROM  STS <-> Stand to Sit transfers x 10 with cues for safety with Rolator Gait Training : 115 feet x 2  with Rolator (Rolator Raised one level) , x 1 with RW both with clinic's AFO donned LLE , with cues for improved step length and knee flexion- pt requires constant cues for trunk extension, distance from Rolator and step length.      [facility] Adult PT Treatment:                    05/29/23                               Seated calf stretch with strap 2x45" L LAQ 2x10 2# STS 2x5 with UE Gait training with AFO on left focused on improved stepping on left with foot clearance x 35ft using rollator Seated toe raises 2x20 Seated hamstring stretch 3x30" L Sit to stand from elevated table with UE support 2 x 5  PATIENT EDUCATION:  Education details: rationale for interventions, HEP  Person educated: Patient Education method: Explanation, Demonstration, Tactile cues, Verbal cues Education comprehension: verbalized understanding, returned  demonstration, verbal cues required, tactile cues required, and needs further education     HOME EXERCISE PROGRAM: Access Code: [email or web address] URL: [email or web address] Date: 05/29/2023 Prepared by: Edwinna Areola  Exercises - Sit to Stand  - 1 x daily - 7 x weekly - 3 sets - 10 reps - Seated Calf Stretch with Strap  - 1 x daily - 7 x weekly - 3 reps - 30 sec hold - Seated Long Arc Quad  - 1 x daily - 7 x weekly - 3 sets - 20 reps - Seated March  - 1 x daily - 7 x weekly - 3 sets - 20 reps - Seated Heel Toe Raises  - 1 x daily - 7 x weekly - 3 sets - 20 reps - Seated Hip Abduction with Resistance  - 1 x daily - 7 x weekly - 3 sets - 20 reps - red band hold - Seated Toe Raise  - 1 x daily - 7 x weekly - 2 sets - 20 reps   ASSESSMENT: CLINICAL IMPRESSION: 06/21/2023 ***  *** Pt was able to complete prescribed exercises with no adverse effect. Exercises today focused on improving gait and L LE deficits. Pt is progressing fair with therapy, does need to be fitted for custom AFO for L LE, niece brought correct MD fax number so a request for referral will be sent today by primary PT.  Increased distance today tolerated well. She requires constant cues for trunk extension, distance from Rolator with gait and transfers, as well as step length with gait. Trial of FW RW in clinic for increased support, pt prefers her Rolator. She is able to squeeze her brakes as needed to prevent Rolator from getting away from her. FW RW  would be safer.   Pt will benefit from continued PT, will continue to progress as able per POC.   Eval: Patient is a 68 y.o. F who was seen today for physical therapy evaluation and treatment for LE weakness, gait instability, and imbalance due to chronic stroke. Physical findings are consistent with MD impression as pt TUG and 30 Second Sit to Stand scores place her at a high risk for falls. Pt would benefit from skilled PT services working on improving strength, balance, and  gait. PT will reach out to referring MD about referral to patient to [facility] for AFO fitting as well.    OBJECTIVE IMPAIRMENTS: Abnormal gait, decreased activity tolerance, decreased balance, decreased endurance, decreased mobility, difficulty walking, decreased ROM, decreased strength, impaired sensation, impaired tone, improper body mechanics, and pain   ACTIVITY LIMITATIONS: carrying, lifting, sitting, standing, squatting, stairs, transfers, reach over head, and locomotion level  PARTICIPATION LIMITATIONS: meal prep, laundry, driving, shopping, community activity, occupation, and yard work  PERSONAL FACTORS: Fitness, Time since onset of injury/illness/exacerbation, and 3+ comorbidities: CVA, HTN, spastic hemiplegia   are also affecting patient's functional outcome.    GOALS: Goals reviewed with patient? No  SHORT TERM GOALS: Target date: 05/26/2023   Pt will be compliant and knowledgeable with initial HEP for improved comfort and carryover Baseline: initial HEP given  Goal status: INITIAL  2.  Pt will self report back and L LE pain no greater than 5/10 for improved comfort and functional ability Baseline: 7/10 at worst Goal status: INITIAL   LONG TERM GOALS: Target date: 06/30/2023   Pt will self report back and L LE pain no greater than 2/10 for improved comfort and functional ability Baseline: 7/10 at worst Goal status: INITIAL   2.  Pt will decrease TUG to no less than 42 seconds for improved balance and safety with home and community navigation Baseline: 55 seconds with rollator and AFO Goal status: INITIAL   3.  Pt will increase 30 Second Sit to Stand rep count to no less than 6 reps for improved balance, strength, and functional mobility Baseline: 4 reps with UE Goal status: INITIAL   4.  Pt will improve standing activity tolerance to no less than 20 minutes for improved ADL performance and indpendence Baseline: 10 minutes Goal status: INITIAL   PLAN: PT  FREQUENCY: 2x/week  PT DURATION: 8 weeks  PLANNED INTERVENTIONS: 97164- PT Re-evaluation, 97110-Therapeutic exercises, 97530- Therapeutic activity, [id number]- Neuromuscular re-education, 97535- Self Care, 16109- Manual therapy, [id number]- Gait training, U0454- Electrical stimulation (unattended), [id number]- Electrical stimulation (manual), 97016- Vasopneumatic device, Cryotherapy, and Moist heat  PLAN FOR NEXT SESSION: LE strengthening, gait and transfers, AFO practice   Ashley Murrain PT, DPT 06/21/2023 10:36 AM

## 2023-06-22 ENCOUNTER — Ambulatory Visit: Attending: Internal Medicine | Admitting: Physical Therapy

## 2023-06-22 ENCOUNTER — Encounter: Payer: Self-pay | Admitting: Physical Therapy

## 2023-06-22 DIAGNOSIS — R2681 Unsteadiness on feet: Secondary | ICD-10-CM | POA: Diagnosis present

## 2023-06-22 DIAGNOSIS — M6281 Muscle weakness (generalized): Secondary | ICD-10-CM | POA: Diagnosis present

## 2023-06-22 DIAGNOSIS — R2689 Other abnormalities of gait and mobility: Secondary | ICD-10-CM | POA: Insufficient documentation

## 2023-07-04 ENCOUNTER — Telehealth: Payer: Self-pay

## 2023-07-04 ENCOUNTER — Ambulatory Visit

## 2023-07-04 NOTE — Telephone Encounter (Signed)
 PT tried to call patient's niece regarding missed appointment on 07/04/2023 but the number on file was not in service.   This is patient's first missed appointment, no change in schedule at this time.  Ivor Mars   07/04/23 9:14 AM

## 2023-07-05 ENCOUNTER — Telehealth: Payer: Self-pay | Admitting: Physical Therapy

## 2023-07-05 ENCOUNTER — Ambulatory Visit: Admitting: Physical Therapy

## 2023-07-05 NOTE — Telephone Encounter (Signed)
 Called pt re: today's missed appt - her niece answers, states they had car trouble and confirms date/time of next appt

## 2023-07-06 ENCOUNTER — Ambulatory Visit

## 2023-07-06 DIAGNOSIS — R2689 Other abnormalities of gait and mobility: Secondary | ICD-10-CM

## 2023-07-06 DIAGNOSIS — R2681 Unsteadiness on feet: Secondary | ICD-10-CM

## 2023-07-06 DIAGNOSIS — M6281 Muscle weakness (generalized): Secondary | ICD-10-CM

## 2023-07-06 NOTE — Therapy (Signed)
 OUTPATIENT PHYSICAL THERAPY TREATMENT   Patient Name: [name] MRN: [id number] DOB:[birth year], 68 y.o., female Today's Date: 07/06/2023     END OF SESSION:  PT End of Session - 07/06/23 0848     Visit Number 6    Number of Visits 17    Date for PT Re-Evaluation 08/03/23    Authorization Type Medicare    Progress Note Due on Visit 15    PT Start Time 0848    Activity Tolerance Patient tolerated treatment well                Past Medical History:  Diagnosis Date   Alcohol abuse    Chronic back pain    Colitis    CVA (cerebral infarction)    Fatty liver    Gastric ulcer    GI bleed    Hepatitis    Hepatitis C   MDD (major depressive disorder)    Pancreatitis    PTSD (post-traumatic stress disorder)    Seizures (HCC)    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) 11/05/2015   Stroke (HCC)    UTI (lower urinary tract infection)    Vision abnormalities    Past Surgical History:  Procedure Laterality Date   ABDOMINAL HYSTERECTOMY     APPENDECTOMY     COLONOSCOPY WITH PROPOFOL  N/A 08/13/2015   Procedure: COLONOSCOPY WITH PROPOFOL ;  Surgeon: Janel Medford, MD;  Location: [facility];  Service: Endoscopy;  Laterality: N/A;   ESOPHAGOGASTRODUODENOSCOPY N/A 05/23/2014   Procedure: ESOPHAGOGASTRODUODENOSCOPY (EGD);  Surgeon: Nannette Babe, MD;  Location: [facility] ENDOSCOPY;  Service: Endoscopy;  Laterality: N/A;   ESOPHAGOGASTRODUODENOSCOPY (EGD) WITH PROPOFOL  N/A 08/13/2015   Procedure: ESOPHAGOGASTRODUODENOSCOPY (EGD) WITH PROPOFOL ;  Surgeon: Janel Medford, MD;  Location: [facility]OSCOPY;  Service: Endoscopy;  Laterality: N/A;   ESOPHAGOGASTRODUODENOSCOPY ENDOSCOPY     gall stone removed     Patient Active Problem List   Diagnosis Date Noted   Mallet toe, acquired, unspecified laterality 11/26/2021   Callus 08/23/2021   Pincer nail deformity 05/19/2021   Hypotension 01/28/2021   AKI (acute kidney injury) (HCC) 01/28/2021   Prolonged QT interval 01/28/2021    Influenza A 01/28/2021   Plantar fasciitis of right foot 01/15/2021   Hyperlipidemia 11/03/2016   Former smoker 01/11/2016   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/05/2015   Left hip pain    History of GI bleed    Spastic bladder    Chronic back pain    ETOH abuse    Hypokalemia    PTSD (post-traumatic stress disorder)    Gastroesophageal reflux disease without esophagitis    Acute ischemic stroke (HCC) 09/27/2015   Acute CVA (cerebrovascular accident) (HCC) 09/27/2015   Smoker    Left-sided weakness 09/26/2015   Acute gastric ulcer    GI bleed 05/22/2014   Acute GI bleeding 05/22/2014   Acute blood loss anemia 05/22/2014   History of stroke 05/22/2014   Mixed hyperlipidemia 05/22/2014   Gastrointestinal hemorrhage with melena    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) 10/09/2012   Flank pain 10/09/2012   Right pontine stroke (HCC) 10/09/2012   ABDOMINAL PAIN OTHER SPECIFIED SITE 05/17/2010   Alcohol abuse 06/16/2007   Cocaine abuse (HCC) 06/16/2007   DEPRESSION 06/16/2007   Essential hypertension 06/16/2007   Cerebral artery occlusion with cerebral infarction (HCC) 06/16/2007   Asthma 06/16/2007   GERD 06/16/2007   PANCREATITIS 11/09/2006   ESOPHAGITIS, REFLUX  05/23/2006    PCP: Davida Espy, MD  REFERRING PROVIDER: Davida Espy, MD  REFERRING DIAG: muscle weakness of the lower extremities from a history of fibromyalgia   THERAPY DIAG:  Muscle weakness (generalized)  Other abnormalities of gait and mobility  Unsteadiness on feet  Rationale for Evaluation and Treatment: Rehabilitation  ONSET DATE: Chronic  SUBJECTIVE:   SUBJECTIVE STATEMENT: 07/06/2023 Pt presents to PT with no current reports of pain. Has been fairly compliant with HEP.   Eval: Pt presents to PT with niece who assists with subjective. Pt with hx of CVA with resultant L sided weakness in 2017, now presenting  as chronic stroke with balance, gait, and strength impairments. Has had a few falls over past months and generally notes that she needs assistance with most higher level ADLs. Can only stand for at max 10 minutes before pain and fatigue which frustrates her greatly. She used to have an AFO on her L foot which helped but she is unable to use it anymore because it has worn out. She is interested about getting a new one.  PERTINENT HISTORY: CVA, HTN, spastic hemiplegia   PAIN:  Are you having pain?  Yes: NPRS scale: 0/10 Worst: 10/10 Pain location: L LE and lower back Pain description: numb, sore Aggravating factors: walking, standing Relieving factors: rest  PRECAUTIONS: Fall  RED FLAGS: None   WEIGHT BEARING RESTRICTIONS: No  FALLS:  Has patient fallen in last 6 months? Yes. Number of falls - 2 falls due to LoB while walking  LIVING ENVIRONMENT: Lives with: lives with their family Lives in: House/apartment Stairs: Yes: Internal: 12 steps; on right going up and External: 1 steps; none Has following equipment at home: Walker - 4 wheeled and shower chair  OCCUPATION: Not working  PLOF: Requires assistive device for independence and needs assistance with higher level   PATIENT GOALS: improve standing tolerance and balance  NEXT MD VISIT: PRN  OBJECTIVE:  Note: Objective measures were completed at Evaluation unless otherwise noted.  DIAGNOSTIC FINDINGS:   N/A  PATIENT SURVEYS:  None assessed at evaluation  COGNITION: Overall cognitive status: Within functional limits for tasks assessed     SENSATION: Light touch: Impaired - L LE  EDEMA:  DNT  MUSCLE LENGTH: Hamstrings: DNT Thomas test: DNT  POSTURE: rounded shoulders, forward head, and flexed trunk   PALPATION: No overt TTP  LOWER EXTREMITY ROM:  Active ROM Right eval Left eval  Hip flexion    Hip extension    Hip abduction    Hip adduction    Hip internal rotation    Hip external rotation    Knee  flexion    Knee extension    Ankle dorsiflexion 10 Lacking 9  Ankle plantarflexion    Ankle inversion    Ankle eversion     (Blank rows = not tested)  LOWER EXTREMITY MMT:  MMT Right eval Left eval  Hip flexion 3+/5 2+/5  Hip extension    Hip abduction 3+/5 2+/5  Hip adduction    Hip internal rotation    Hip external rotation    Knee flexion 3+/5 2+/5  Knee extension 3+/5 2+/5  Ankle dorsiflexion    Ankle plantarflexion    Ankle inversion    Ankle eversion     (Blank rows = not tested)  LOWER EXTREMITY SPECIAL TESTS:  DNT  FUNCTIONAL TESTS:  30 Second Sit to Stand: 4 reps with UE TUG: 55 sec with rollator and clinic AFO  06/22/23: TUG 46.65 sec rollator, no AFO 30secSTS 3 reps w/ UE support,  4 reps on trial 2 and 3  GAIT: Distance walked: 31ft Assistive device utilized: Environmental consultant - 4 wheeled Level of assistance: SBA Comments: decrease L heel strike, L knee hyperextension   TREATMENT: [facility] Adult PT Treatment:                                                DATE: 07/06/23 Therapeutic Activity: Long sitting calf stretch with strap 2x30" L Supine SLR 2x10 each Bridge with PT assist holding L LE 3x10 Seated toe raises 2x20 LAQ 2x10 2# Seated hamstring curl 2x10 RTB STS CGA 2x5 - low table with UE Gait Training: Ambulate with Min A and clinic AFO donned on L foot 2x117ft with rollator - VC for foot clearance   [facility] Adult PT Treatment:                                                DATE: 06/22/23 Therapeutic Activity: Education/discussion re: progress with PT, symptom behavior as it affects activity tolerance, PT goals/POC TUG + education 30sec STS+ education (3 trials) Gait Training: X20 ft rollator cues for step length and posture, clearance w/backing up to chair, proximity to AD and AD management w/ turns X40 ft rollator + 1#ankle weight LLE cues for LLE step length, posture X64ft rollator cues for posture and AD management   [facility] Adult PT Treatment:                                                 DATE: 06/02/23 Therapeutic Exercise: Seated calf stretch with strap Seated LAQ -assist on left with strap Therapeutic Activity: Nustep L1 UE/LE for LE ROM  STS <-> Stand to Sit transfers x 10 with cues for safety with Rolator Gait Training : 115 feet x 2  with Rolator (Rolator Raised one level) , x 1 with RW both with clinic's AFO donned LLE , with cues for improved step length and knee flexion- pt requires constant cues for trunk extension, distance from Rolator and step length.      [facility] Adult PT Treatment:                    05/29/23                               Seated calf stretch with strap 2x45" L LAQ 2x10 2# STS 2x5 with UE Gait training with AFO on left focused on improved stepping on left with foot clearance x 75ft using rollator Seated toe raises 2x20 Seated hamstring stretch 3x30" L Sit to stand from elevated table with UE support 2 x 5  PATIENT EDUCATION:  Education details: rationale for interventions, HEP, PT goals/POC and progress with PT Person educated: Patient Education method: Explanation, Demonstration, Tactile cues, Verbal cues Education comprehension: verbalized understanding, returned demonstration, verbal cues required, tactile cues required, and needs further education     HOME EXERCISE PROGRAM: Access Code: [email or web address]   ASSESSMENT: CLINICAL IMPRESSION:  07/06/2023 Pt was able to complete prescribed exercises with no adverse effect. Exercises today focused on improving gait and L LE deficits as well as improving functional mobility and activity tolerance. HEP updated for continued proximal hip strength. Pt will benefit from continued PT, will continue to progress as able per POC.   Eval: Patient is a 68 y.o. F who was seen today for physical therapy evaluation and treatment for LE weakness, gait instability, and imbalance due to chronic stroke. Physical findings are consistent with MD impression as pt TUG and 30 Second  Sit to Stand scores place her at a high risk for falls. Pt would benefit from skilled PT services working on improving strength, balance, and gait. PT will reach out to referring MD about referral to patient to [facility] for AFO fitting as well.    OBJECTIVE IMPAIRMENTS: Abnormal gait, decreased activity tolerance, decreased balance, decreased endurance, decreased mobility, difficulty walking, decreased ROM, decreased strength, impaired sensation, impaired tone, improper body mechanics, and pain   ACTIVITY LIMITATIONS: carrying, lifting, sitting, standing, squatting, stairs, transfers, reach over head, and locomotion level  PARTICIPATION LIMITATIONS: meal prep, laundry, driving, shopping, community activity, occupation, and yard work  PERSONAL FACTORS: Fitness, Time since onset of injury/illness/exacerbation, and 3+ comorbidities: CVA, HTN, spastic hemiplegia   are also affecting patient's functional outcome.    GOALS: Goals reviewed with patient? No  SHORT TERM GOALS: Target date: 05/26/2023   Pt will be compliant and knowledgeable with initial HEP for improved comfort and carryover Baseline: initial HEP given  06/22/23: reports good HEP adherence Goal status: MET  2.  Pt will self report back and L LE pain no greater than 5/10 for improved comfort and functional ability Baseline: 7/10 at worst 06/22/23: 10/10 at worst Goal status: ONGOING  LONG TERM GOALS: Target date: 08/03/2023  (Updated 06/22/23)   Pt will self report back and L LE pain no greater than 2/10 for improved comfort and functional ability Baseline: 7/10 at worst 06/22/23: 10/10 at worst Goal status: ONGOING  2.  Pt will decrease TUG to no less than 42 seconds for improved balance and safety with home and community navigation Baseline: 55 seconds with rollator and AFO 06/22/23: 46 sec rollator no AFO Goal status: PROGRESSING  3.  Pt will increase 30 Second Sit to Stand rep count to no less than 6 reps for improved  balance, strength, and functional mobility Baseline: 4 reps with UE 06/22/23: 3 reps first trial, 4 reps on trial 2 and 3 Goal status: ONGOING  4.  Pt will improve standing activity tolerance to no less than 20 minutes for improved ADL performance and indpendence Baseline: 10 minutes 06/22/23: 15 min d/t back pain Goal status: PROGRESSING   PLAN: (updated 06/22/23) PT FREQUENCY: 2x/week  PT DURATION: 6 weeks  PLANNED INTERVENTIONS: 97164- PT Re-evaluation, 97110-Therapeutic exercises, 97530- Therapeutic activity, 97112- Neuromuscular re-education, 97535- Self Care, 16109- Manual therapy, [id number]- Gait training, [id number]- Electrical stimulation (unattended), [id number]- Electrical stimulation (manual), 97016- Vasopneumatic device, Cryotherapy, and Moist heat  PLAN FOR NEXT SESSION: LE strengthening, gait and transfers, AFO practice   Ivor Mars PT  07/06/23 10:18 AM

## 2023-07-10 ENCOUNTER — Ambulatory Visit

## 2023-07-14 ENCOUNTER — Encounter: Payer: Self-pay | Admitting: Physical Therapy

## 2023-07-14 ENCOUNTER — Ambulatory Visit: Attending: Internal Medicine | Admitting: Physical Therapy

## 2023-07-14 DIAGNOSIS — M6281 Muscle weakness (generalized): Secondary | ICD-10-CM | POA: Diagnosis present

## 2023-07-14 DIAGNOSIS — R2689 Other abnormalities of gait and mobility: Secondary | ICD-10-CM | POA: Diagnosis present

## 2023-07-14 DIAGNOSIS — R2681 Unsteadiness on feet: Secondary | ICD-10-CM | POA: Insufficient documentation

## 2023-07-14 NOTE — Therapy (Signed)
 OUTPATIENT PHYSICAL THERAPY TREATMENT   Patient Name: [name] MRN: [id number] DOB:1956-02-09, 68 y.o., female Today's Date: 07/14/2023     END OF SESSION:  PT End of Session - 07/14/23 0848     Visit Number 7    Number of Visits 17    Date for PT Re-Evaluation 08/03/23    Authorization Type Medicare    PT Start Time 0845    PT Stop Time 0925    PT Time Calculation (min) 40 min                Past Medical History:  Diagnosis Date   Alcohol abuse    Chronic back pain    Colitis    CVA (cerebral infarction)    Fatty liver    Gastric ulcer    GI bleed    Hepatitis    Hepatitis C   MDD (major depressive disorder)    Pancreatitis    PTSD (post-traumatic stress disorder)    Seizures (HCC)    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) 11/05/2015   Stroke (HCC)    UTI (lower urinary tract infection)    Vision abnormalities    Past Surgical History:  Procedure Laterality Date   ABDOMINAL HYSTERECTOMY     APPENDECTOMY     COLONOSCOPY WITH PROPOFOL  N/A 08/13/2015   Procedure: COLONOSCOPY WITH PROPOFOL ;  Surgeon: Janel Medford, MD;  Location: [facility] END[facility]COPY;  Service: Endoscopy;  Laterality: N/A;   ESOPHAGOGASTRODUODENOSCOPY N/A 05/23/2014   Procedure: ESOPHAGOGASTRODUODENOSCOPY (EGD);  Surgeon: Nannette Babe, MD;  Location: [facility] ENDOSCOPY;  Service: Endoscopy;  Laterality: N/A;   ESOPHAGOGASTRODUODENOSCOPY (EGD) WITH PROPOFOL  N/A 08/13/2015   Procedure: ESOPHAGOGASTRODUODENOSCOPY (EGD) WITH PROPOFOL ;  Surgeon: Janel Medford, MD;  Location: [facility] ENDOSCOPY;  Service: Endoscopy;  Laterality: N/A;   ESOPHAGOGASTRODUODENOSCOPY ENDOSCOPY     gall stone removed     Patient Active Problem List   Diagnosis Date Noted   Mallet toe, acquired, unspecified laterality 11/26/2021   Callus 08/23/2021   Pincer nail deformity 05/19/2021   Hypotension 01/28/2021   AKI (acute kidney injury) (HCC) 01/28/2021   Prolonged QT interval 01/28/2021   Influenza A 01/28/2021    Plantar fasciitis of right foot 01/15/2021   Hyperlipidemia 11/03/2016   Former smoker 01/11/2016   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/05/2015   Left hip pain    History of GI bleed    Spastic bladder    Chronic back pain    ETOH abuse    Hypokalemia    PTSD (post-traumatic stress disorder)    Gastroesophageal reflux disease without esophagitis    Acute ischemic stroke (HCC) 09/27/2015   Acute CVA (cerebrovascular accident) (HCC) 09/27/2015   Smoker    Left-sided weakness 09/26/2015   Acute gastric ulcer    GI bleed 05/22/2014   Acute GI bleeding 05/22/2014   Acute blood loss anemia 05/22/2014   History of stroke 05/22/2014   Mixed hyperlipidemia 05/22/2014   Gastrointestinal hemorrhage with melena    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) 10/09/2012   Flank pain 10/09/2012   Right pontine stroke (HCC) 10/09/2012   ABDOMINAL PAIN OTHER SPECIFIED SITE 05/17/2010   Alcohol abuse 06/16/2007   Cocaine abuse (HCC) 06/16/2007   DEPRESSION 06/16/2007   Essential hypertension 06/16/2007   Cerebral artery occlusion with cerebral infarction (HCC) 06/16/2007   Asthma 06/16/2007   GERD 06/16/2007   PANCREATITIS 11/09/2006   ESOPHAGITIS, REFLUX 05/23/2006  PCP: Davida Espy, MD  REFERRING PROVIDER: Davida Espy, MD  REFERRING DIAG: muscle weakness of the lower extremities from a history of fibromyalgia   THERAPY DIAG:  Muscle weakness (generalized)  Other abnormalities of gait and mobility  Unsteadiness on feet  Rationale for Evaluation and Treatment: Rehabilitation  ONSET DATE: Chronic  SUBJECTIVE:   SUBJECTIVE STATEMENT: 07/14/2023 Pt presents to PT with mild soreness in low back due to just getting up. Reports that she has an appt 5/13 for her AFO assessment.    Eval: Pt presents to PT with niece who assists with subjective. Pt with hx of CVA with resultant L sided weakness in 2017,  now presenting as chronic stroke with balance, gait, and strength impairments. Has had a few falls over past months and generally notes that she needs assistance with most higher level ADLs. Can only stand for at max 10 minutes before pain and fatigue which frustrates her greatly. She used to have an AFO on her L foot which helped but she is unable to use it anymore because it has worn out. She is interested about getting a new one.  PERTINENT HISTORY: CVA, HTN, spastic hemiplegia   PAIN:  Are you having pain?  Yes: NPRS scale: 0/10 Worst: 10/10 Pain location: L LE and lower back Pain description: numb, sore Aggravating factors: walking, standing Relieving factors: rest  PRECAUTIONS: Fall  RED FLAGS: None   WEIGHT BEARING RESTRICTIONS: No  FALLS:  Has patient fallen in last 6 months? Yes. Number of falls - 2 falls due to LoB while walking  LIVING ENVIRONMENT: Lives with: lives with their family Lives in: House/apartment Stairs: Yes: Internal: 12 steps; on right going up and External: 1 steps; none Has following equipment at home: Walker - 4 wheeled and shower chair  OCCUPATION: Not working  PLOF: Requires assistive device for independence and needs assistance with higher level   PATIENT GOALS: improve standing tolerance and balance  NEXT MD VISIT: PRN  OBJECTIVE:  Note: Objective measures were completed at Evaluation unless otherwise noted.  DIAGNOSTIC FINDINGS:   N/A  PATIENT SURVEYS:  None assessed at evaluation  COGNITION: Overall cognitive status: Within functional limits for tasks assessed     SENSATION: Light touch: Impaired - L LE  EDEMA:  DNT  MUSCLE LENGTH: Hamstrings: DNT Thomas test: DNT  POSTURE: rounded shoulders, forward head, and flexed trunk   PALPATION: No overt TTP  LOWER EXTREMITY ROM:  Active ROM Right eval Left eval  Hip flexion    Hip extension    Hip abduction    Hip adduction    Hip internal rotation    Hip external  rotation    Knee flexion    Knee extension    Ankle dorsiflexion 10 Lacking 9  Ankle plantarflexion    Ankle inversion    Ankle eversion     (Blank rows = not tested)  LOWER EXTREMITY MMT:  MMT Right eval Left eval  Hip flexion 3+/5 2+/5  Hip extension    Hip abduction 3+/5 2+/5  Hip adduction    Hip internal rotation    Hip external rotation    Knee flexion 3+/5 2+/5  Knee extension 3+/5 2+/5  Ankle dorsiflexion    Ankle plantarflexion    Ankle inversion    Ankle eversion     (Blank rows = not tested)  LOWER EXTREMITY SPECIAL TESTS:  DNT  FUNCTIONAL TESTS:  30 Second Sit to Stand: 4 reps with UE TUG: 55 sec  with rollator and clinic AFO   06/22/23: TUG 46.65 sec rollator, no AFO 30secSTS 3 reps w/ UE support,  4 reps on trial 2 and 3  GAIT: Distance walked: 47ft Assistive device utilized: Environmental consultant - 4 wheeled Level of assistance: SBA Comments: decrease L heel strike, L knee hyperextension   TREATMENT: [facility] Adult PT Treatment:                                                DATE: 07/14/23 Therapeutic Activity: Seated calf stretch with strap 2x30" L Supine SLR 2x10 each Bridge with PT assist holding L LE 3x10 Seated toe raises 2x20 LAQ 2x10 2#- strap assist for concentric  H/L clam red band x 20 STS CGA 1x10 - low table with UE to rise Gait Training: Ambulate with SBA and clinic AFO donned on L foot 143ft with rollator - VC for foot clearance     [facility] Adult PT Treatment:                                                DATE: 07/06/23 Therapeutic Activity: Long sitting calf stretch with strap 2x30" L Supine SLR 2x10 each Bridge with PT assist holding L LE 3x10 Seated toe raises 2x20 LAQ 2x10 2# Seated hamstring curl 2x10 RTB STS CGA 2x5 - low table with UE Gait Training: Ambulate with Min A and clinic AFO donned on L foot 2x138ft with rollator - VC for foot clearance   [facility] Adult PT Treatment:                                                DATE:  06/22/23 Therapeutic Activity: Education/discussion re: progress with PT, symptom behavior as it affects activity tolerance, PT goals/POC TUG + education 30sec STS+ education (3 trials) Gait Training: X20 ft rollator cues for step length and posture, clearance w/backing up to chair, proximity to AD and AD management w/ turns X40 ft rollator + 1#ankle weight LLE cues for LLE step length, posture X45ft rollator cues for posture and AD management   [facility] Adult PT Treatment:                                                DATE: 06/02/23 Therapeutic Exercise: Seated calf stretch with strap Seated LAQ -assist on left with strap Therapeutic Activity: Nustep L1 UE/LE for LE ROM  STS <-> Stand to Sit transfers x 10 with cues for safety with Rolator Gait Training : 115 feet x 2  with Rolator (Rolator Raised one level) , x 1 with RW both with clinic's AFO donned LLE , with cues for improved step length and knee flexion- pt requires constant cues for trunk extension, distance from Rolator and step length.      [facility] Adult PT Treatment:                    05/29/23  Seated calf stretch with strap 2x45" L LAQ 2x10 2# STS 2x5 with UE Gait training with AFO on left focused on improved stepping on left with foot clearance x 27ft using rollator Seated toe raises 2x20 Seated hamstring stretch 3x30" L Sit to stand from elevated table with UE support 2 x 5  PATIENT EDUCATION:  Education details: rationale for interventions, HEP, PT goals/POC and progress with PT Person educated: Patient Education method: Explanation, Demonstration, Tactile cues, Verbal cues Education comprehension: verbalized understanding, returned demonstration, verbal cues required, tactile cues required, and needs further education     HOME EXERCISE PROGRAM: Access Code: [email or web address]   ASSESSMENT: CLINICAL IMPRESSION: 07/14/2023 Pt was able to complete prescribed exercises with no adverse effect. Exercises  today focused on improving gait and L LE deficits as well as improving functional mobility and activity tolerance. No updates to HEP today. Increased gait distance without rest break. Pt has 5/13 appt for AFO per her report.  Pt will benefit from continued PT, will continue to progress as able per POC.   Eval: Patient is a 68 y.o. F who was seen today for physical therapy evaluation and treatment for LE weakness, gait instability, and imbalance due to chronic stroke. Physical findings are consistent with MD impression as pt TUG and 30 Second Sit to Stand scores place her at a high risk for falls. Pt would benefit from skilled PT services working on improving strength, balance, and gait. PT will reach out to referring MD about referral to patient to [facility] for AFO fitting as well.    OBJECTIVE IMPAIRMENTS: Abnormal gait, decreased activity tolerance, decreased balance, decreased endurance, decreased mobility, difficulty walking, decreased ROM, decreased strength, impaired sensation, impaired tone, improper body mechanics, and pain   ACTIVITY LIMITATIONS: carrying, lifting, sitting, standing, squatting, stairs, transfers, reach over head, and locomotion level  PARTICIPATION LIMITATIONS: meal prep, laundry, driving, shopping, community activity, occupation, and yard work  PERSONAL FACTORS: Fitness, Time since onset of injury/illness/exacerbation, and 3+ comorbidities: CVA, HTN, spastic hemiplegia   are also affecting patient's functional outcome.    GOALS: Goals reviewed with patient? No  SHORT TERM GOALS: Target date: 05/26/2023   Pt will be compliant and knowledgeable with initial HEP for improved comfort and carryover Baseline: initial HEP given  06/22/23: reports good HEP adherence Goal status: MET  2.  Pt will self report back and L LE pain no greater than 5/10 for improved comfort and functional ability Baseline: 7/10 at worst 06/22/23: 10/10 at worst Goal status: ONGOING  LONG  TERM GOALS: Target date: 08/03/2023  (Updated 06/22/23)   Pt will self report back and L LE pain no greater than 2/10 for improved comfort and functional ability Baseline: 7/10 at worst 06/22/23: 10/10 at worst Goal status: ONGOING  2.  Pt will decrease TUG to no less than 42 seconds for improved balance and safety with home and community navigation Baseline: 55 seconds with rollator and AFO 06/22/23: 46 sec rollator no AFO Goal status: PROGRESSING  3.  Pt will increase 30 Second Sit to Stand rep count to no less than 6 reps for improved balance, strength, and functional mobility Baseline: 4 reps with UE 06/22/23: 3 reps first trial, 4 reps on trial 2 and 3 Goal status: ONGOING  4.  Pt will improve standing activity tolerance to no less than 20 minutes for improved ADL performance and indpendence Baseline: 10 minutes 06/22/23: 15 min d/t back pain Goal status: PROGRESSING   PLAN: (updated 06/22/23) PT FREQUENCY: 2x/week  PT DURATION: 6 weeks  PLANNED INTERVENTIONS: 97164- PT Re-evaluation, 97110-Therapeutic exercises, 97530- Therapeutic activity, [id number]- Neuromuscular re-education, 97535- Self Care, 97140- Manual therapy, [id number]- Gait training, [phone]- Electrical stimulation (unattended), [id number]- Electrical stimulation (manual), 97016- Vasopneumatic device, Cryotherapy, and Moist heat  PLAN FOR NEXT SESSION: LE strengthening, gait and transfers, AFO practice   Gasper Karst, PTA 07/14/23 9:27 AM Phone: [phone] Fax: [phone]

## 2023-07-17 ENCOUNTER — Ambulatory Visit (INDEPENDENT_AMBULATORY_CARE_PROVIDER_SITE_OTHER): Payer: Medicare Other | Admitting: Podiatry

## 2023-07-17 DIAGNOSIS — Z91199 Patient's noncompliance with other medical treatment and regimen due to unspecified reason: Secondary | ICD-10-CM

## 2023-07-17 NOTE — Progress Notes (Signed)
 No show

## 2023-07-19 ENCOUNTER — Ambulatory Visit

## 2023-07-19 DIAGNOSIS — R2689 Other abnormalities of gait and mobility: Secondary | ICD-10-CM

## 2023-07-19 DIAGNOSIS — M6281 Muscle weakness (generalized): Secondary | ICD-10-CM | POA: Diagnosis not present

## 2023-07-19 NOTE — Therapy (Signed)
 OUTPATIENT PHYSICAL THERAPY TREATMENT   Patient Name: [name] MRN: [id number] DOB:1956/02/07, 68 y.o., female Today's Date: 07/19/2023     END OF SESSION:  PT End of Session - 07/19/23 0857     Visit Number 8    Number of Visits 17    Date for PT Re-Evaluation 08/03/23    Authorization Type Medicare    PT Start Time 0857   arrived late   PT Stop Time 0930    PT Time Calculation (min) 33 min                 Past Medical History:  Diagnosis Date   Alcohol abuse    Chronic back pain    Colitis    CVA (cerebral infarction)    Fatty liver    Gastric ulcer    GI bleed    Hepatitis    Hepatitis C   MDD (major depressive disorder)    Pancreatitis    PTSD (post-traumatic stress disorder)    Seizures (HCC)    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) 11/05/2015   Stroke (HCC)    UTI (lower urinary tract infection)    Vision abnormalities    Past Surgical History:  Procedure Laterality Date   ABDOMINAL HYSTERECTOMY     APPENDECTOMY     COLONOSCOPY WITH PROPOFOL  N/A 08/13/2015   Procedure: COLONOSCOPY WITH PROPOFOL ;  Surgeon: Janel Medford, MD;  Location: [facility];  Service: Endoscopy;  Laterality: N/A;   ESOPHAGOGASTRODUODENOSCOPY N/A 05/23/2014   Procedure: ESOPHAGOGASTRODUODENOSCOPY (EGD);  Surgeon: Nannette Babe, MD;  Location: [facility] ENDOSCOPY;  Service: Endoscopy;  Laterality: N/A;   ESOPHAGOGASTRODUODENOSCOPY (EGD) WITH PROPOFOL  N/A 08/13/2015   Procedure: ESOPHAGOGASTRODUODENOSCOPY (EGD) WITH PROPOFOL ;  Surgeon: Janel Medford, MD;  Location: [facility];  Service: Endoscopy;  Laterality: N/A;   ESOPHAGOGASTRODUODENOSCOPY ENDOSCOPY     gall stone removed     Patient Active Problem List   Diagnosis Date Noted   Mallet toe, acquired, unspecified laterality 11/26/2021   Callus 08/23/2021   Pincer nail deformity 05/19/2021   Hypotension 01/28/2021   AKI (acute kidney injury) (HCC) 01/28/2021   Prolonged QT interval 01/28/2021   Influenza A  01/28/2021   Plantar fasciitis of right foot 01/15/2021   Hyperlipidemia 11/03/2016   Former smoker 01/11/2016   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/05/2015   Left hip pain    History of GI bleed    Spastic bladder    Chronic back pain    ETOH abuse    Hypokalemia    PTSD (post-traumatic stress disorder)    Gastroesophageal reflux disease without esophagitis    Acute ischemic stroke (HCC) 09/27/2015   Acute CVA (cerebrovascular accident) (HCC) 09/27/2015   Smoker    Left-sided weakness 09/26/2015   Acute gastric ulcer    GI bleed 05/22/2014   Acute GI bleeding 05/22/2014   Acute blood loss anemia 05/22/2014   History of stroke 05/22/2014   Mixed hyperlipidemia 05/22/2014   Gastrointestinal hemorrhage with melena    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) 10/09/2012   Flank pain 10/09/2012   Right pontine stroke (HCC) 10/09/2012   ABDOMINAL PAIN OTHER SPECIFIED SITE 05/17/2010   Alcohol abuse 06/16/2007   Cocaine abuse (HCC) 06/16/2007   DEPRESSION 06/16/2007   Essential hypertension 06/16/2007   Cerebral artery occlusion with cerebral infarction (HCC) 06/16/2007   Asthma 06/16/2007   GERD 06/16/2007   PANCREATITIS 11/09/2006  ESOPHAGITIS, REFLUX 05/23/2006    PCP: Davida Espy, MD  REFERRING PROVIDER: Davida Espy, MD  REFERRING DIAG: muscle weakness of the lower extremities from a history of fibromyalgia   THERAPY DIAG:  Muscle weakness (generalized)  Other abnormalities of gait and mobility  Rationale for Evaluation and Treatment: Rehabilitation  ONSET DATE: Chronic  SUBJECTIVE:   SUBJECTIVE STATEMENT: 07/19/2023 Pt presents to PT with no current pain. Has been compliant with HEP.    Eval: Pt presents to PT with niece who assists with subjective. Pt with hx of CVA with resultant L sided weakness in 2017, now presenting as chronic stroke with balance, gait, and strength  impairments. Has had a few falls over past months and generally notes that she needs assistance with most higher level ADLs. Can only stand for at max 10 minutes before pain and fatigue which frustrates her greatly. She used to have an AFO on her L foot which helped but she is unable to use it anymore because it has worn out. She is interested about getting a new one.  PERTINENT HISTORY: CVA, HTN, spastic hemiplegia   PAIN:  Are you having pain?  Yes: NPRS scale: 0/10 Worst: 10/10 Pain location: L LE and lower back Pain description: numb, sore Aggravating factors: walking, standing Relieving factors: rest  PRECAUTIONS: Fall  RED FLAGS: None   WEIGHT BEARING RESTRICTIONS: No  FALLS:  Has patient fallen in last 6 months? Yes. Number of falls - 2 falls due to LoB while walking  LIVING ENVIRONMENT: Lives with: lives with their family Lives in: House/apartment Stairs: Yes: Internal: 12 steps; on right going up and External: 1 steps; none Has following equipment at home: Walker - 4 wheeled and shower chair  OCCUPATION: Not working  PLOF: Requires assistive device for independence and needs assistance with higher level   PATIENT GOALS: improve standing tolerance and balance  NEXT MD VISIT: PRN  OBJECTIVE:  Note: Objective measures were completed at Evaluation unless otherwise noted.  DIAGNOSTIC FINDINGS:   N/A  PATIENT SURVEYS:  None assessed at evaluation  COGNITION: Overall cognitive status: Within functional limits for tasks assessed     SENSATION: Light touch: Impaired - L LE  EDEMA:  DNT  MUSCLE LENGTH: Hamstrings: DNT Thomas test: DNT  POSTURE: rounded shoulders, forward head, and flexed trunk   PALPATION: No overt TTP  LOWER EXTREMITY ROM:  Active ROM Right eval Left eval  Hip flexion    Hip extension    Hip abduction    Hip adduction    Hip internal rotation    Hip external rotation    Knee flexion    Knee extension    Ankle dorsiflexion  10 Lacking 9  Ankle plantarflexion    Ankle inversion    Ankle eversion     (Blank rows = not tested)  LOWER EXTREMITY MMT:  MMT Right eval Left eval  Hip flexion 3+/5 2+/5  Hip extension    Hip abduction 3+/5 2+/5  Hip adduction    Hip internal rotation    Hip external rotation    Knee flexion 3+/5 2+/5  Knee extension 3+/5 2+/5  Ankle dorsiflexion    Ankle plantarflexion    Ankle inversion    Ankle eversion     (Blank rows = not tested)  LOWER EXTREMITY SPECIAL TESTS:  DNT  FUNCTIONAL TESTS:  30 Second Sit to Stand: 4 reps with UE TUG: 55 sec with rollator and clinic AFO   06/22/23: TUG 46.65 sec  rollator, no AFO 30secSTS 3 reps w/ UE support,  4 reps on trial 2 and 3  GAIT: Distance walked: 6ft Assistive device utilized: Environmental consultant - 4 wheeled Level of assistance: SBA Comments: decrease L heel strike, L knee hyperextension   TREATMENT: [facility] Adult PT Treatment:                                                DATE: 07/19/23 Therapeutic Activity: Supine calf stretch with strap 2x30" L Supine SLR 2x10 each Manual hamstring stretch 3x30" L ankle DF 3x10 YTB Seated toe raises 2x20 STS CGA 1x10 - low table with UE to rise Standing calf stretch on slant board 2x30" L - added to HEP  [facility] Adult PT Treatment:                                                DATE: 07/14/23 Therapeutic Activity: Seated calf stretch with strap 2x30" L Supine SLR 2x10 each Bridge with PT assist holding L LE 3x10 Seated toe raises 2x20 LAQ 2x10 2#- strap assist for concentric  H/L clam red band x 20 STS CGA 1x10 - low table with UE to rise Gait Training: Ambulate with SBA and clinic AFO donned on L foot 145ft with rollator - VC for foot clearance   [facility] Adult PT Treatment:                                                DATE: 07/06/23 Therapeutic Activity: Long sitting calf stretch with strap 2x30" L Supine SLR 2x10 each Bridge with PT assist holding L LE 3x10 Seated toe raises  2x20 LAQ 2x10 2# Seated hamstring curl 2x10 RTB STS CGA 2x5 - low table with UE Gait Training: Ambulate with Min A and clinic AFO donned on L foot 2x129ft with rollator - VC for foot clearance   [facility] Adult PT Treatment:                                                DATE: 06/22/23 Therapeutic Activity: Education/discussion re: progress with PT, symptom behavior as it affects activity tolerance, PT goals/POC TUG + education 30sec STS+ education (3 trials) Gait Training: X20 ft rollator cues for step length and posture, clearance w/backing up to chair, proximity to AD and AD management w/ turns X40 ft rollator + 1#ankle weight LLE cues for LLE step length, posture X67ft rollator cues for posture and AD management   [facility] Adult PT Treatment:                                                DATE: 06/02/23 Therapeutic Exercise: Seated calf stretch with strap Seated LAQ -assist on left with strap Therapeutic Activity: Nustep L1 UE/LE for LE ROM  STS <-> Stand to Sit transfers x 10 with cues for  safety with Rolator Gait Training : 115 feet x 2  with Rolator (Rolator Raised one level) , x 1 with RW both with clinic's AFO donned LLE , with cues for improved step length and knee flexion- pt requires constant cues for trunk extension, distance from Rolator and step length.      [facility] Adult PT Treatment:                    05/29/23                               Seated calf stretch with strap 2x45" L LAQ 2x10 2# STS 2x5 with UE Gait training with AFO on left focused on improved stepping on left with foot clearance x 47ft using rollator Seated toe raises 2x20 Seated hamstring stretch 3x30" L Sit to stand from elevated table with UE support 2 x 5  PATIENT EDUCATION:  Education details: rationale for interventions, HEP, PT goals/POC and progress with PT Person educated: Patient Education method: Explanation, Demonstration, Tactile cues, Verbal cues Education comprehension: verbalized  understanding, returned demonstration, verbal cues required, tactile cues required, and needs further education     HOME EXERCISE PROGRAM: Access Code: [email or web address]   ASSESSMENT: CLINICAL IMPRESSION: 07/19/2023 Pt was able to complete all prescribed exercises with no adverse effect. Exercises today focused on continued LE strengthening and focus on L ankle stretching. Will be seen for AFO fitting on 5/13. PT will continue per POC.   Eval: Patient is a 68 y.o. F who was seen today for physical therapy evaluation and treatment for LE weakness, gait instability, and imbalance due to chronic stroke. Physical findings are consistent with MD impression as pt TUG and 30 Second Sit to Stand scores place her at a high risk for falls. Pt would benefit from skilled PT services working on improving strength, balance, and gait. PT will reach out to referring MD about referral to patient to [facility] for AFO fitting as well.    OBJECTIVE IMPAIRMENTS: Abnormal gait, decreased activity tolerance, decreased balance, decreased endurance, decreased mobility, difficulty walking, decreased ROM, decreased strength, impaired sensation, impaired tone, improper body mechanics, and pain   ACTIVITY LIMITATIONS: carrying, lifting, sitting, standing, squatting, stairs, transfers, reach over head, and locomotion level  PARTICIPATION LIMITATIONS: meal prep, laundry, driving, shopping, community activity, occupation, and yard work  PERSONAL FACTORS: Fitness, Time since onset of injury/illness/exacerbation, and 3+ comorbidities: CVA, HTN, spastic hemiplegia   are also affecting patient's functional outcome.    GOALS: Goals reviewed with patient? No  SHORT TERM GOALS: Target date: 05/26/2023   Pt will be compliant and knowledgeable with initial HEP for improved comfort and carryover Baseline: initial HEP given  06/22/23: reports good HEP adherence Goal status: MET  2.  Pt will self report back and L LE pain no greater than  5/10 for improved comfort and functional ability Baseline: 7/10 at worst 06/22/23: 10/10 at worst Goal status: ONGOING  LONG TERM GOALS: Target date: 08/03/2023  (Updated 06/22/23)   Pt will self report back and L LE pain no greater than 2/10 for improved comfort and functional ability Baseline: 7/10 at worst 06/22/23: 10/10 at worst Goal status: ONGOING  2.  Pt will decrease TUG to no less than 42 seconds for improved balance and safety with home and community navigation Baseline: 55 seconds with rollator and AFO 06/22/23: 46 sec rollator no AFO Goal status: PROGRESSING  3.  Pt  will increase 30 Second Sit to Stand rep count to no less than 6 reps for improved balance, strength, and functional mobility Baseline: 4 reps with UE 06/22/23: 3 reps first trial, 4 reps on trial 2 and 3 Goal status: ONGOING  4.  Pt will improve standing activity tolerance to no less than 20 minutes for improved ADL performance and indpendence Baseline: 10 minutes 06/22/23: 15 min d/t back pain Goal status: PROGRESSING   PLAN: (updated 06/22/23) PT FREQUENCY: 2x/week  PT DURATION: 6 weeks  PLANNED INTERVENTIONS: 97164- PT Re-evaluation, 97110-Therapeutic exercises, 97530- Therapeutic activity, 97112- Neuromuscular re-education, 97535- Self Care, [id number]- Manual therapy, [id number]- Gait training, [id number]- Electrical stimulation (unattended), [id number]- Electrical stimulation (manual), 97016- Vasopneumatic device, Cryotherapy, and Moist heat  PLAN FOR NEXT SESSION: LE strengthening, gait and transfers, AFO practice   Ivor Mars PT  07/19/23 9:39 AM

## 2023-07-24 ENCOUNTER — Ambulatory Visit

## 2023-07-24 NOTE — Therapy (Incomplete)
 OUTPATIENT PHYSICAL THERAPY TREATMENT   Patient Name: [name] MRN: [id number] DOB:06/08/1955, 68 y.o., female Today's Date: 07/24/2023     END OF SESSION:        Past Medical History:  Diagnosis Date   Alcohol abuse    Chronic back pain    Colitis    CVA (cerebral infarction)    Fatty liver    Gastric ulcer    GI bleed    Hepatitis    Hepatitis C   MDD (major depressive disorder)    Pancreatitis    PTSD (post-traumatic stress disorder)    Seizures (HCC)    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) 11/05/2015   Stroke (HCC)    UTI (lower urinary tract infection)    Vision abnormalities    Past Surgical History:  Procedure Laterality Date   ABDOMINAL HYSTERECTOMY     APPENDECTOMY     COLONOSCOPY WITH PROPOFOL  N/A 08/13/2015   Procedure: COLONOSCOPY WITH PROPOFOL ;  Surgeon: Janel Medford, MD;  Location: [facility] ENDOSCOPY;  Service: Endoscopy;  Laterality: N/A;   ESOPHAGOGASTRODUODENOSCOPY N/A 05/23/2014   Procedure: ESOPHAGOGASTRODUODENOSCOPY (EGD);  Surgeon: Nannette Babe, MD;  Location: [facility] ENDOSCOPY;  Service: Endoscopy;  Laterality: N/A;   ESOPHAGOGASTRODUODENOSCOPY (EGD) WITH PROPOFOL  N/A 08/13/2015   Procedure: ESOPHAGOGASTRODUODENOSCOPY (EGD) WITH PROPOFOL ;  Surgeon: Janel Medford, MD;  Location: [facility] ENDOSCOPY;  Service: Endoscopy;  Laterality: N/A;   ESOPHAGOGASTRODUODENOSCOPY ENDOSCOPY     gall stone removed     Patient Active Problem List   Diagnosis Date Noted   Mallet toe, acquired, unspecified laterality 11/26/2021   Callus 08/23/2021   Pincer nail deformity 05/19/2021   Hypotension 01/28/2021   AKI (acute kidney injury) (HCC) 01/28/2021   Prolonged QT interval 01/28/2021   Influenza A 01/28/2021   Plantar fasciitis of right foot 01/15/2021   Hyperlipidemia 11/03/2016   Former smoker 01/11/2016   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/05/2015   Left hip pain    History of GI bleed    Spastic bladder     Chronic back pain    ETOH abuse    Hypokalemia    PTSD (post-traumatic stress disorder)    Gastroesophageal reflux disease without esophagitis    Acute ischemic stroke (HCC) 09/27/2015   Acute CVA (cerebrovascular accident) (HCC) 09/27/2015   Smoker    Left-sided weakness 09/26/2015   Acute gastric ulcer    GI bleed 05/22/2014   Acute GI bleeding 05/22/2014   Acute blood loss anemia 05/22/2014   History of stroke 05/22/2014   Mixed hyperlipidemia 05/22/2014   Gastrointestinal hemorrhage with melena    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) 10/09/2012   Flank pain 10/09/2012   Right pontine stroke (HCC) 10/09/2012   ABDOMINAL PAIN OTHER SPECIFIED SITE 05/17/2010   Alcohol abuse 06/16/2007   Cocaine abuse (HCC) 06/16/2007   DEPRESSION 06/16/2007   Essential hypertension 06/16/2007   Cerebral artery occlusion with cerebral infarction (HCC) 06/16/2007   Asthma 06/16/2007   GERD 06/16/2007   PANCREATITIS 11/09/2006   ESOPHAGITIS, REFLUX 05/23/2006    PCP: Davida Espy, MD  REFERRING PROVIDER: Davida Espy, MD  REFERRING DIAG: muscle weakness of the lower extremities from a history of fibromyalgia   THERAPY DIAG:  No diagnosis found.  Rationale for Evaluation and Treatment: Rehabilitation  ONSET DATE: Chronic  SUBJECTIVE:   SUBJECTIVE STATEMENT: 07/24/2023 ***   Eval: Pt presents to PT with niece who assists with  subjective. Pt with hx of CVA with resultant L sided weakness in 2017, now presenting as chronic stroke with balance, gait, and strength impairments. Has had a few falls over past months and generally notes that she needs assistance with most higher level ADLs. Can only stand for at max 10 minutes before pain and fatigue which frustrates her greatly. She used to have an AFO on her L foot which helped but she is unable to use it anymore because it has worn out. She is interested about getting a new one.  PERTINENT  HISTORY: CVA, HTN, spastic hemiplegia   PAIN:  Are you having pain?  Yes: NPRS scale: 0/10 Worst: 10/10 Pain location: L LE and lower back Pain description: numb, sore Aggravating factors: walking, standing Relieving factors: rest  PRECAUTIONS: Fall  RED FLAGS: None   WEIGHT BEARING RESTRICTIONS: No  FALLS:  Has patient fallen in last 6 months? Yes. Number of falls - 2 falls due to LoB while walking  LIVING ENVIRONMENT: Lives with: lives with their family Lives in: House/apartment Stairs: Yes: Internal: 12 steps; on right going up and External: 1 steps; none Has following equipment at home: Walker - 4 wheeled and shower chair  OCCUPATION: Not working  PLOF: Requires assistive device for independence and needs assistance with higher level   PATIENT GOALS: improve standing tolerance and balance  NEXT MD VISIT: PRN  OBJECTIVE:  Note: Objective measures were completed at Evaluation unless otherwise noted.  DIAGNOSTIC FINDINGS:   N/A  PATIENT SURVEYS:  None assessed at evaluation  COGNITION: Overall cognitive status: Within functional limits for tasks assessed     SENSATION: Light touch: Impaired - L LE  EDEMA:  DNT  MUSCLE LENGTH: Hamstrings: DNT Thomas test: DNT  POSTURE: rounded shoulders, forward head, and flexed trunk   PALPATION: No overt TTP  LOWER EXTREMITY ROM:  Active ROM Right eval Left eval  Hip flexion    Hip extension    Hip abduction    Hip adduction    Hip internal rotation    Hip external rotation    Knee flexion    Knee extension    Ankle dorsiflexion 10 Lacking 9  Ankle plantarflexion    Ankle inversion    Ankle eversion     (Blank rows = not tested)  LOWER EXTREMITY MMT:  MMT Right eval Left eval  Hip flexion 3+/5 2+/5  Hip extension    Hip abduction 3+/5 2+/5  Hip adduction    Hip internal rotation    Hip external rotation    Knee flexion 3+/5 2+/5  Knee extension 3+/5 2+/5  Ankle dorsiflexion    Ankle  plantarflexion    Ankle inversion    Ankle eversion     (Blank rows = not tested)  LOWER EXTREMITY SPECIAL TESTS:  DNT  FUNCTIONAL TESTS:  30 Second Sit to Stand: 4 reps with UE TUG: 55 sec with rollator and clinic AFO   06/22/23: TUG 46.65 sec rollator, no AFO 30secSTS 3 reps w/ UE support,  4 reps on trial 2 and 3  GAIT: Distance walked: 67ft Assistive device utilized: Walker - 4 wheeled Level of assistance: SBA Comments: decrease L heel strike, L knee hyperextension   TREATMENT: [facility] Adult PT Treatment:  DATE: 07/19/23 Therapeutic Activity: Supine calf stretch with strap 2x30" L Supine SLR 2x10 each Manual hamstring stretch 3x30" L ankle DF 3x10 YTB Seated toe raises 2x20 STS CGA 1x10 - low table with UE to rise Standing calf stretch on slant board 2x30" L - added to HEP  [facility] Adult PT Treatment:                                                DATE: 07/14/23 Therapeutic Activity: Seated calf stretch with strap 2x30" L Supine SLR 2x10 each Bridge with PT assist holding L LE 3x10 Seated toe raises 2x20 LAQ 2x10 2#- strap assist for concentric  H/L clam red band x 20 STS CGA 1x10 - low table with UE to rise Gait Training: Ambulate with SBA and clinic AFO donned on L foot 191ft with rollator - VC for foot clearance   [facility] Adult PT Treatment:                                                DATE: 07/06/23 Therapeutic Activity: Long sitting calf stretch with strap 2x30" L Supine SLR 2x10 each Bridge with PT assist holding L LE 3x10 Seated toe raises 2x20 LAQ 2x10 2# Seated hamstring curl 2x10 RTB STS CGA 2x5 - low table with UE Gait Training: Ambulate with Min A and clinic AFO donned on L foot 2x171ft with rollator - VC for foot clearance   [facility] Adult PT Treatment:                                                DATE: 06/22/23 Therapeutic Activity: Education/discussion re: progress with PT, symptom behavior as it affects  activity tolerance, PT goals/POC TUG + education 30sec STS+ education (3 trials) Gait Training: X20 ft rollator cues for step length and posture, clearance w/backing up to chair, proximity to AD and AD management w/ turns X40 ft rollator + 1#ankle weight LLE cues for LLE step length, posture X42ft rollator cues for posture and AD management   [facility] Adult PT Treatment:                                                DATE: 06/02/23 Therapeutic Exercise: Seated calf stretch with strap Seated LAQ -assist on left with strap Therapeutic Activity: Nustep L1 UE/LE for LE ROM  STS <-> Stand to Sit transfers x 10 with cues for safety with Rolator Gait Training : 115 feet x 2  with Rolator (Rolator Raised one level) , x 1 with RW both with clinic's AFO donned LLE , with cues for improved step length and knee flexion- pt requires constant cues for trunk extension, distance from Rolator and step length.      [facility] Adult PT Treatment:                    05/29/23  Seated calf stretch with strap 2x45" L LAQ 2x10 2# STS 2x5 with UE Gait training with AFO on left focused on improved stepping on left with foot clearance x 62ft using rollator Seated toe raises 2x20 Seated hamstring stretch 3x30" L Sit to stand from elevated table with UE support 2 x 5  PATIENT EDUCATION:  Education details: rationale for interventions, HEP, PT goals/POC and progress with PT Person educated: Patient Education method: Explanation, Demonstration, Tactile cues, Verbal cues Education comprehension: verbalized understanding, returned demonstration, verbal cues required, tactile cues required, and needs further education     HOME EXERCISE PROGRAM: Access Code: [email or web address]   ASSESSMENT: CLINICAL IMPRESSION: 07/24/2023 *** Pt was able to complete all prescribed exercises with no adverse effect. Exercises today focused on continued LE strengthening and focus on L ankle stretching. Will be seen for  AFO fitting on 5/13. PT will continue per POC.   Eval: Patient is a 68 y.o. F who was seen today for physical therapy evaluation and treatment for LE weakness, gait instability, and imbalance due to chronic stroke. Physical findings are consistent with MD impression as pt TUG and 30 Second Sit to Stand scores place her at a high risk for falls. Pt would benefit from skilled PT services working on improving strength, balance, and gait. PT will reach out to referring MD about referral to patient to [facility] for AFO fitting as well.    OBJECTIVE IMPAIRMENTS: Abnormal gait, decreased activity tolerance, decreased balance, decreased endurance, decreased mobility, difficulty walking, decreased ROM, decreased strength, impaired sensation, impaired tone, improper body mechanics, and pain   ACTIVITY LIMITATIONS: carrying, lifting, sitting, standing, squatting, stairs, transfers, reach over head, and locomotion level  PARTICIPATION LIMITATIONS: meal prep, laundry, driving, shopping, community activity, occupation, and yard work  PERSONAL FACTORS: Fitness, Time since onset of injury/illness/exacerbation, and 3+ comorbidities: CVA, HTN, spastic hemiplegia  are also affecting patient's functional outcome.    GOALS: Goals reviewed with patient? No  SHORT TERM GOALS: Target date: 05/26/2023   Pt will be compliant and knowledgeable with initial HEP for improved comfort and carryover Baseline: initial HEP given  06/22/23: reports good HEP adherence Goal status: MET  2.  Pt will self report back and L LE pain no greater than 5/10 for improved comfort and functional ability Baseline: 7/10 at worst 06/22/23: 10/10 at worst Goal status: ONGOING  LONG TERM GOALS: Target date: 08/03/2023  (Updated 06/22/23)   Pt will self report back and L LE pain no greater than 2/10 for improved comfort and functional ability Baseline: 7/10 at worst 06/22/23: 10/10 at worst Goal status: ONGOING  2.  Pt will decrease  TUG to no less than 42 seconds for improved balance and safety with home and community navigation Baseline: 55 seconds with rollator and AFO 06/22/23: 46 sec rollator no AFO Goal status: PROGRESSING  3.  Pt will increase 30 Second Sit to Stand rep count to no less than 6 reps for improved balance, strength, and functional mobility Baseline: 4 reps with UE 06/22/23: 3 reps first trial, 4 reps on trial 2 and 3 Goal status: ONGOING  4.  Pt will improve standing activity tolerance to no less than 20 minutes for improved ADL performance and indpendence Baseline: 10 minutes 06/22/23: 15 min d/t back pain Goal status: PROGRESSING   PLAN: (updated 06/22/23) PT FREQUENCY: 2x/week  PT DURATION: 6 weeks  PLANNED INTERVENTIONS: 97164- PT Re-evaluation, 97110-Therapeutic exercises, 97530- Therapeutic activity, 97112- Neuromuscular re-education, 97535- Self Care, 16109- Manual therapy, [id number]- Gait  training, [phone]- Electrical stimulation (unattended), [id number]- Electrical stimulation (manual), [id number]- Vasopneumatic device, Cryotherapy, and Moist heat  PLAN FOR NEXT SESSION: LE strengthening, gait and transfers, AFO practice   Ivor Mars PT  07/24/23 7:55 AM

## 2023-07-28 ENCOUNTER — Ambulatory Visit: Admitting: Physical Therapy

## 2023-07-28 ENCOUNTER — Encounter: Payer: Self-pay | Admitting: Physical Therapy

## 2023-07-28 DIAGNOSIS — R2689 Other abnormalities of gait and mobility: Secondary | ICD-10-CM

## 2023-07-28 DIAGNOSIS — R2681 Unsteadiness on feet: Secondary | ICD-10-CM

## 2023-07-28 DIAGNOSIS — M6281 Muscle weakness (generalized): Secondary | ICD-10-CM | POA: Diagnosis not present

## 2023-07-28 NOTE — Therapy (Addendum)
 OUTPATIENT PHYSICAL THERAPY TREATMENT/DISCHARGE  PHYSICAL THERAPY DISCHARGE SUMMARY  Visits from Start of Care: 9  Current functional level related to goals / functional outcomes: See goals and objective   Remaining deficits: See goals and objective   Education / Equipment: HEP and AFO instruction  Patient agrees to discharge. Patient goals were partially met. Patient is being discharged due to maximized rehab potential.   Patient Name: [name] MRN: [id number] DOB:February 17, 1956, 68 y.o., female Today's Date: 07/28/2023     END OF SESSION:  PT End of Session - 07/28/23 0843     Visit Number 9    Number of Visits 17    Date for PT Re-Evaluation 08/03/23    Authorization Type Medicare    Progress Note Due on Visit 15    PT Start Time 0845    PT Stop Time 0925    PT Time Calculation (min) 40 min                  Past Medical History:  Diagnosis Date   Alcohol abuse    Chronic back pain    Colitis    CVA (cerebral infarction)    Fatty liver    Gastric ulcer    GI bleed    Hepatitis    Hepatitis C   MDD (major depressive disorder)    Pancreatitis    PTSD (post-traumatic stress disorder)    Seizures (HCC)    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) 11/05/2015   Stroke (HCC)    UTI (lower urinary tract infection)    Vision abnormalities    Past Surgical History:  Procedure Laterality Date   ABDOMINAL HYSTERECTOMY     APPENDECTOMY     COLONOSCOPY WITH PROPOFOL  N/A 08/13/2015   Procedure: COLONOSCOPY WITH PROPOFOL ;  Surgeon: Janel Medford, MD;  Location: [facility]COPY;  Service: Endoscopy;  Laterality: N/A;   ESOPHAGOGASTRODUODENOSCOPY N/A 05/23/2014   Procedure: ESOPHAGOGASTRODUODENOSCOPY (EGD);  Surgeon: Nannette Babe, MD;  Location: [facility] ENDOSCOPY;  Service: Endoscopy;  Laterality: N/A;   ESOPHAGOGASTRODUODENOSCOPY (EGD) WITH PROPOFOL  N/A 08/13/2015   Procedure: ESOPHAGOGASTRODUODENOSCOPY (EGD) WITH PROPOFOL ;  Surgeon: Janel Medford, MD;   Location: [facility] ENDOSCOPY;  Service: Endoscopy;  Laterality: N/A;   ESOPHAGOGASTRODUODENOSCOPY ENDOSCOPY     gall stone removed     Patient Active Problem List   Diagnosis Date Noted   Mallet toe, acquired, unspecified laterality 11/26/2021   Callus 08/23/2021   Pincer nail deformity 05/19/2021   Hypotension 01/28/2021   AKI (acute kidney injury) (HCC) 01/28/2021   Prolonged QT interval 01/28/2021   Influenza A 01/28/2021   Plantar fasciitis of right foot 01/15/2021   Hyperlipidemia 11/03/2016   Former smoker 01/11/2016   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/05/2015   Left hip pain    History of GI bleed    Spastic bladder    Chronic back pain    ETOH abuse    Hypokalemia    PTSD (post-traumatic stress disorder)    Gastroesophageal reflux disease without esophagitis    Acute ischemic stroke (HCC) 09/27/2015   Acute CVA (cerebrovascular accident) (HCC) 09/27/2015   Smoker    Left-sided weakness 09/26/2015   Acute gastric ulcer    GI bleed 05/22/2014   Acute GI bleeding 05/22/2014   Acute blood loss anemia 05/22/2014   History of stroke 05/22/2014   Mixed hyperlipidemia 05/22/2014   Gastrointestinal hemorrhage with melena    Cerebral infarction due to thrombosis of right  middle cerebral artery (HCC) 10/09/2012   Flank pain 10/09/2012   Right pontine stroke (HCC) 10/09/2012   ABDOMINAL PAIN OTHER SPECIFIED SITE 05/17/2010   Alcohol abuse 06/16/2007   Cocaine abuse (HCC) 06/16/2007   DEPRESSION 06/16/2007   Essential hypertension 06/16/2007   Cerebral artery occlusion with cerebral infarction (HCC) 06/16/2007   Asthma 06/16/2007   GERD 06/16/2007   PANCREATITIS 11/09/2006   ESOPHAGITIS, REFLUX 05/23/2006    PCP: Davida Espy, MD  REFERRING PROVIDER: Davida Espy, MD  REFERRING DIAG: muscle weakness of the lower extremities from a history of fibromyalgia   THERAPY DIAG:  Muscle weakness (generalized)  Other abnormalities  of gait and mobility  Unsteadiness on feet  Rationale for Evaluation and Treatment: Rehabilitation  ONSET DATE: Chronic  SUBJECTIVE:   SUBJECTIVE STATEMENT: 07/28/2023 Pt reports she fell 2 days ago when reaching for a cup of water that was on the table, got dizzy and fell back onto her bottom. Was able to pull up using table leg. Sister died on last 09-13-2023. I was fitted for AFO 3 days ago. Will be ready in a few weeks.   8/10 back pain but pain is not bad.    Eval: Pt presents to PT with niece who assists with subjective. Pt with hx of CVA with resultant L sided weakness in 2017, now presenting as chronic stroke with balance, gait, and strength impairments. Has had a few falls over past months and generally notes that she needs assistance with most higher level ADLs. Can only stand for at max 10 minutes before pain and fatigue which frustrates her greatly. She used to have an AFO on her L foot which helped but she is unable to use it anymore because it has worn out. She is interested about getting a new one.  PERTINENT HISTORY: CVA, HTN, spastic hemiplegia   PAIN:  Are you having pain?  Yes: NPRS scale: 8/10 Worst: 10/10 Pain location: L LE and lower back Pain description: numb, sore Aggravating factors: walking, standing Relieving factors: rest  PRECAUTIONS: Fall  RED FLAGS: None   WEIGHT BEARING RESTRICTIONS: No  FALLS:  Has patient fallen in last 6 months? Yes. Number of falls - 2 falls due to LoB while walking  LIVING ENVIRONMENT: Lives with: lives with their family Lives in: House/apartment Stairs: Yes: Internal: 12 steps; on right going up and External: 1 steps; none Has following equipment at home: Walker - 4 wheeled and shower chair  OCCUPATION: Not working  PLOF: Requires assistive device for independence and needs assistance with higher level   PATIENT GOALS: improve standing tolerance and balance  NEXT MD VISIT: PRN  OBJECTIVE:  Note: Objective measures  were completed at Evaluation unless otherwise noted.  DIAGNOSTIC FINDINGS:   N/A  PATIENT SURVEYS:  None assessed at evaluation  COGNITION: Overall cognitive status: Within functional limits for tasks assessed     SENSATION: Light touch: Impaired - L LE  EDEMA:  DNT  MUSCLE LENGTH: Hamstrings: DNT Thomas test: DNT  POSTURE: rounded shoulders, forward head, and flexed trunk   PALPATION: No overt TTP  LOWER EXTREMITY ROM:  Active ROM Right eval Left eval  Hip flexion    Hip extension    Hip abduction    Hip adduction    Hip internal rotation    Hip external rotation    Knee flexion    Knee extension    Ankle dorsiflexion 10 Lacking 9  Ankle plantarflexion    Ankle inversion  Ankle eversion     (Blank rows = not tested)  LOWER EXTREMITY MMT:  MMT Right eval Left eval  Hip flexion 3+/5 2+/5  Hip extension    Hip abduction 3+/5 2+/5  Hip adduction    Hip internal rotation    Hip external rotation    Knee flexion 3+/5 2+/5  Knee extension 3+/5 2+/5  Ankle dorsiflexion    Ankle plantarflexion    Ankle inversion    Ankle eversion     (Blank rows = not tested)  LOWER EXTREMITY SPECIAL TESTS:  DNT  FUNCTIONAL TESTS:  30 Second Sit to Stand: 4 reps with UE TUG: 55 sec with rollator and clinic AFO   06/22/23: TUG 46.65 sec rollator, no AFO 30secSTS 3 reps w/ UE support,  4 reps on trial 2 and 3  GAIT: Distance walked: 33ft Assistive device utilized: Environmental consultant - 4 wheeled Level of assistance: SBA Comments: decrease L heel strike, L knee hyperextension   TREATMENT: [facility] Adult PT Treatment:                                                DATE: 07/28/23 Therapeutic Exercise: Review of HEP  Therapeutic Activity: TUG 5 x STS    [facility] Adult PT Treatment:                                                DATE: 07/19/23 Therapeutic Activity: Supine calf stretch with strap 2x30" L Supine SLR 2x10 each Manual hamstring stretch 3x30" L ankle DF 3x10  YTB Seated toe raises 2x20 STS CGA 1x10 - low table with UE to rise Standing calf stretch on slant board 2x30" L - added to HEP  [facility] Adult PT Treatment:                                                DATE: 07/14/23 Therapeutic Activity: Seated calf stretch with strap 2x30" L Supine SLR 2x10 each Bridge with PT assist holding L LE 3x10 Seated toe raises 2x20 LAQ 2x10 2#- strap assist for concentric  H/L clam red band x 20 STS CGA 1x10 - low table with UE to rise Gait Training: Ambulate with SBA and clinic AFO donned on L foot 164ft with rollator - VC for foot clearance   [facility] Adult PT Treatment:                                                DATE: 07/06/23 Therapeutic Activity: Long sitting calf stretch with strap 2x30" L Supine SLR 2x10 each Bridge with PT assist holding L LE 3x10 Seated toe raises 2x20 LAQ 2x10 2# Seated hamstring curl 2x10 RTB STS CGA 2x5 - low table with UE Gait Training: Ambulate with Min A and clinic AFO donned on L foot 2x149ft with rollator - VC for foot clearance   [facility] Adult PT Treatment:  DATE: 06/22/23 Therapeutic Activity: Education/discussion re: progress with PT, symptom behavior as it affects activity tolerance, PT goals/POC TUG + education 30sec STS+ education (3 trials) Gait Training: X20 ft rollator cues for step length and posture, clearance w/backing up to chair, proximity to AD and AD management w/ turns X40 ft rollator + 1#ankle weight LLE cues for LLE step length, posture X72ft rollator cues for posture and AD management   [facility] Adult PT Treatment:                                                DATE: 06/02/23 Therapeutic Exercise: Seated calf stretch with strap Seated LAQ -assist on left with strap Therapeutic Activity: Nustep L1 UE/LE for LE ROM  STS <-> Stand to Sit transfers x 10 with cues for safety with Rolator Gait Training : 115 feet x 2  with Rolator (Rolator Raised one level) , x  1 with RW both with clinic's AFO donned LLE , with cues for improved step length and knee flexion- pt requires constant cues for trunk extension, distance from Rolator and step length.      [facility] Adult PT Treatment:                    05/29/23                               Seated calf stretch with strap 2x45" L LAQ 2x10 2# STS 2x5 with UE Gait training with AFO on left focused on improved stepping on left with foot clearance x 60ft using rollator Seated toe raises 2x20 Seated hamstring stretch 3x30" L Sit to stand from elevated table with UE support 2 x 5  PATIENT EDUCATION:  Education details: rationale for interventions, HEP, PT goals/POC and progress with PT Person educated: Patient Education method: Explanation, Demonstration, Tactile cues, Verbal cues Education comprehension: verbalized understanding, returned demonstration, verbal cues required, tactile cues required, and needs further education     HOME EXERCISE PROGRAM: Access Code: [email or web address]   ASSESSMENT: CLINICAL IMPRESSION: 07/28/2023 Pt reports her sister passed away 5 days ago. She was fitted for AFO 3 days ago and she fell 2 days ago, feels like it was related to dizziness. Denies injury. AFO will take 2-3 weeks per patient. Her back pain and activity tolerance have not changed much per her report. Her 5 x STS and her TUG have both improved. Gait mechanics are significantly better with AFO donned. Reviewed HEP today and pt is agreeable to DC to HEP and will pick up her AFO when it is ready. Recommended she f/u with PCP regarding dizziness.    Eval: Patient is a 68 y.o. F who was seen today for physical therapy evaluation and treatment for LE weakness, gait instability, and imbalance due to chronic stroke. Physical findings are consistent with MD impression as pt TUG and 30 Second Sit to Stand scores place her at a high risk for falls. Pt would benefit from skilled PT services working on improving strength, balance, and gait. PT  will reach out to referring MD about referral to patient to [facility] for AFO fitting as well.    OBJECTIVE IMPAIRMENTS: Abnormal gait, decreased activity tolerance, decreased balance, decreased endurance, decreased mobility, difficulty walking, decreased ROM, decreased strength, impaired sensation, impaired tone, improper body  mechanics, and pain   ACTIVITY LIMITATIONS: carrying, lifting, sitting, standing, squatting, stairs, transfers, reach over head, and locomotion level  PARTICIPATION LIMITATIONS: meal prep, laundry, driving, shopping, community activity, occupation, and yard work  PERSONAL FACTORS: Fitness, Time since onset of injury/illness/exacerbation, and 3+ comorbidities: CVA, HTN, spastic hemiplegia  are also affecting patient's functional outcome.    GOALS: Goals reviewed with patient? No  SHORT TERM GOALS: Target date: 05/26/2023   Pt will be compliant and knowledgeable with initial HEP for improved comfort and carryover Baseline: initial HEP given  06/22/23: reports good HEP adherence Goal status: MET  2.  Pt will self report back and L LE pain no greater than 5/10 for improved comfort and functional ability Baseline: 7/10 at worst 06/22/23: 10/10 at worst 07/28/23: no overall change reported Goal status: NOT MET  LONG TERM GOALS: Target date: 08/03/2023  (Updated 06/22/23)   Pt will self report back and L LE pain no greater than 2/10 for improved comfort and functional ability Baseline: 7/10 at worst 06/22/23: 10/10 at worst 07/28/23: overall no change reported  Goal status: NOT MET  2.  Pt will decrease TUG to no less than 42 seconds for improved balance and safety with home and community navigation Baseline: 55 seconds with rollator and AFO 06/22/23: 46 sec rollator no AFO 07/28/23: 34.3 sec rollator and AFO Goal status: MET  3.  Pt will increase 30 Second Sit to Stand rep count to no less than 6 reps for improved balance, strength, and functional  mobility Baseline: 4 reps with UE 06/22/23: 3 reps first trial, 4 reps on trial 2 and 3 07/28/23: 5 reps (plus a stand)  Goal status: Nearly Met   4.  Pt will improve standing activity tolerance to no less than 20 minutes for improved ADL performance and indpendence Baseline: 10 minutes 06/22/23: 15 min d/t back pain 07/28/23: some days are better than others, 15 min at best Goal status: NOT MET   PLAN: (updated 06/22/23) PT FREQUENCY: 2x/week  PT DURATION: 6 weeks  PLANNED INTERVENTIONS: 97164- PT Re-evaluation, 97110-Therapeutic exercises, 97530- Therapeutic activity, 97112- Neuromuscular re-education, 97535- Self Care, 16109- Manual therapy, [id number]- Gait training, U0454- Electrical stimulation (unattended), [id number]- Electrical stimulation (manual), 97016- Vasopneumatic device, Cryotherapy, and Moist heat  PLAN FOR NEXT SESSION: N/A DC to HEP  Gasper Karst, PTA 07/28/23 9:21 AM Phone: [phone] Fax: [phone]

## 2023-08-30 ENCOUNTER — Ambulatory Visit (INDEPENDENT_AMBULATORY_CARE_PROVIDER_SITE_OTHER): Admitting: Podiatry

## 2023-08-30 ENCOUNTER — Encounter: Payer: Self-pay | Admitting: Podiatry

## 2023-08-30 DIAGNOSIS — B351 Tinea unguium: Secondary | ICD-10-CM

## 2023-08-30 DIAGNOSIS — I739 Peripheral vascular disease, unspecified: Secondary | ICD-10-CM

## 2023-08-30 DIAGNOSIS — M79675 Pain in left toe(s): Secondary | ICD-10-CM

## 2023-08-30 DIAGNOSIS — M79674 Pain in right toe(s): Secondary | ICD-10-CM

## 2023-08-30 NOTE — Progress Notes (Signed)
  Subjective:  Patient ID: [name], female    DOB: 1955-09-21,   MRN: [id number]  Chief Complaint  Patient presents with   [facility]    RM#6 RFC left foot top of second toe callus causing pain and bottom of third toe.    68 y.o. female presents for concern of thickened elongated and painful nails that are difficult to trim. Requesting to have them trimmed today. History of PAD and clotting disorder and at risk for foot care.   PCP:  Davida Espy, MD    . Denies any other pedal complaints. Denies n/v/f/c.   Past Medical History:  Diagnosis Date   Alcohol abuse    Chronic back pain    Colitis    CVA (cerebral infarction)    Fatty liver    Gastric ulcer    GI bleed    Hepatitis    Hepatitis C   MDD (major depressive disorder)    Pancreatitis    PTSD (post-traumatic stress disorder)    Seizures (HCC)    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) 11/05/2015   Stroke (HCC)    UTI (lower urinary tract infection)    Vision abnormalities     Objective:  Physical Exam: Vascular: DP/PT pulses 1/4 bilateral. CFT <3 seconds. Normal hair growth on digits. No edema.  Skin. No lacerations or abrasions bilateral feet. Nails 1-5 bilateral are thickened elongated and dystrophic.   Musculoskeletal: MMT 5/5 bilateral lower extremities in DF, PF, Inversion and Eversion. Deceased ROM in DF of ankle joint.  Neurological: Sensation intact to light touch.   Assessment:   1. Pain due to onychomycosis of toenails of both feet   2. PAD (peripheral artery disease) (HCC)      Plan:  Patient was evaluated and treated and all questions answered. -Mechanically debrided all nails 1-5 bilateral using sterile nail nipper and filed with dremel without incident  -Answered all patient questions -Patient to return  in 3 months for at risk foot care -Patient advised to call the office if any problems or questions arise in the meantime.   Jennefer Moats, DPM

## 2023-10-25 ENCOUNTER — Encounter: Payer: Self-pay | Admitting: Occupational Therapy

## 2023-10-25 ENCOUNTER — Ambulatory Visit: Attending: Optometry | Admitting: Occupational Therapy

## 2023-10-25 DIAGNOSIS — R41842 Visuospatial deficit: Secondary | ICD-10-CM | POA: Diagnosis present

## 2023-10-25 DIAGNOSIS — H409 Unspecified glaucoma: Secondary | ICD-10-CM | POA: Diagnosis present

## 2023-10-25 DIAGNOSIS — H543 Unqualified visual loss, both eyes: Secondary | ICD-10-CM | POA: Insufficient documentation

## 2023-10-25 NOTE — Therapy (Signed)
 OUTPATIENT OCCUPATIONAL THERAPY LOW VISION EVALUATION  Patient Name: [name] MRN: [id number] DOB:[birth year], 68 y.o., female Today's Date: 10/25/2023  PCP: [name]  REFERRING PROVIDER: Octavia Bruckner, MD  END OF SESSION:  OT End of Session - 10/25/23 1405     Visit Number 1    Number of Visits 9    Date for OT Re-Evaluation 12/22/23    Authorization Type Medicare    OT Start Time 1407    OT Stop Time 1458    OT Time Calculation (min) 51 min    Activity Tolerance Patient tolerated treatment well    Behavior During Therapy WFL for tasks assessed/performed         Past Medical History:  Diagnosis Date   Alcohol abuse    Chronic back pain    Colitis    CVA (cerebral infarction)    Fatty liver    Gastric ulcer    GI bleed    Hepatitis    Hepatitis C   MDD (major depressive disorder)    Pancreatitis    PTSD (post-traumatic stress disorder)    Seizures (HCC)    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) 11/05/2015   Stroke (HCC)    UTI (lower urinary tract infection)    Vision abnormalities    Past Surgical History:  Procedure Laterality Date   ABDOMINAL HYSTERECTOMY     APPENDECTOMY     COLONOSCOPY WITH PROPOFOL  N/A 08/13/2015   Procedure: COLONOSCOPY WITH PROPOFOL ;  Surgeon: Alexa SHAUNNA Cedar, MD;  Location: [facility];  Service: Endoscopy;  Laterality: N/A;   ESOPHAGOGASTRODUODENOSCOPY N/A 05/23/2014   Procedure: ESOPHAGOGASTRODUODENOSCOPY (EGD);  Surgeon: [name] Starch, MD;  Location: [facility] ENDOSCOPY;  Service: Endoscopy;  Laterality: N/A;   ESOPHAGOGASTRODUODENOSCOPY (EGD) WITH PROPOFOL  N/A 08/13/2015   Procedure: ESOPHAGOGASTRODUODENOSCOPY (EGD) WITH PROPOFOL ;  Surgeon: Alexa SHAUNNA Cedar, MD;  Location: [facility];  Service: Endoscopy;  Laterality: N/A;   ESOPHAGOGASTRODUODENOSCOPY ENDOSCOPY     gall stone removed     Patient Active Problem List   Diagnosis Date Noted   Mallet toe, acquired, unspecified laterality 11/26/2021   Callus  08/23/2021   Pincer nail deformity 05/19/2021   Hypotension 01/28/2021   AKI (acute kidney injury) (HCC) 01/28/2021   Prolonged QT interval 01/28/2021   Influenza A 01/28/2021   Plantar fasciitis of right foot 01/15/2021   Hyperlipidemia 11/03/2016   Former smoker 01/11/2016   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/05/2015   Left hip pain    History of GI bleed    Spastic bladder    Chronic back pain    ETOH abuse    Hypokalemia    PTSD (post-traumatic stress disorder)    Gastroesophageal reflux disease without esophagitis    Acute ischemic stroke (HCC) 09/27/2015   Acute CVA (cerebrovascular accident) (HCC) 09/27/2015   Smoker    Left-sided weakness 09/26/2015   Acute gastric ulcer    GI bleed 05/22/2014   Acute GI bleeding 05/22/2014   Acute blood loss anemia 05/22/2014   History of stroke 05/22/2014   Mixed hyperlipidemia 05/22/2014   Gastrointestinal hemorrhage with melena    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) 10/09/2012   Flank pain 10/09/2012   Right pontine stroke (HCC) 10/09/2012   ABDOMINAL PAIN OTHER SPECIFIED SITE 05/17/2010   Alcohol abuse 06/16/2007   Cocaine abuse (HCC) 06/16/2007   DEPRESSION 06/16/2007   Essential hypertension 06/16/2007   Cerebral artery occlusion with  cerebral infarction (HCC) 06/16/2007   Asthma 06/16/2007   GERD 06/16/2007   PANCREATITIS 11/09/2006   ESOPHAGITIS, REFLUX 05/23/2006    ONSET DATE: 09/11/2023 (Date of referral); 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   Pt accompanied by: family members Niece and Grandniece  PERTINENT HISTORY:  PMH: Severe Glaucoma, CVA, MDD, PTSD, HTN, asthma, Hepatitis, seizures, chronic back pain, cocaine abuse, and alcohol abuse  PAIN:  Are you having pain? No  FALLS: Has patient fallen in last 6 months? Yes. 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  - gets locked out, hard to see  PHONE: flip phone with large buttons for phone calls  HAND DOMINANCE: Left  ADLs: Overall ADLs: mod I  IADLs: Shopping: walks around [place] to pick out items Light housekeeping: sweeps floors and cleans tables, feels surfaces to make sure it's fully clean Meal Prep: uses microwave, liked to cook spare ribs and deviled eggs Community mobility: dependent Medication management: mod I Financial management: dependent Handwriting: hasn't written well since her stroke  MOBILITY STATUS: Needs Assist: rollator and SBA for safety  FUNCTIONAL OUTCOME MEASURES: PSFS: 1.3 total score   COGNITION: Overall cognitive status: Within functional limits for tasks assessed  VISUAL FINDINGS: Baseline vision: wears glasses for TV Visual history: glaucoma, cataracts, and corrective eye surgery  OBSERVATIONS: Pt ambulates with use of rollator. No loss of balance though requires tactile and verbal cues to navigate clinic environment.  TODAY'S TREATMENT:                                                                                                                                OT educated pt on rehabilitation process and results of objective measures in relation to pt specific goals.   OT educated patient on general vision recommendations including increased/improved lighting, increased contrast, and reduction of visual clutter as noted in patient instructions.   PATIENT EDUCATION: Education details: OT Role and POC; general low vision recommendations Person educated: Patient and niece Marko and grandniece Autumn Education method: Explanation; handouts  Education comprehension: verbalized understanding and needs further education  HOME  EXERCISE PROGRAM: 10/25/2023: general low vision strategies  GOALS:  SHORT TERM GOALS: Target date: 11/22/2023    Patient will independently recall at least 2 compensatory strategies for visual impairment without cueing. Baseline: Goal status: INITIAL  2.  Patient will return demonstration of visual adaptation use and verbalize understanding of how to obtain item(s) if desired.  Baseline:  Goal status: INITIAL  LONG TERM GOALS: Target date: 12/22/2023    Patient will report at least two-point increase in average PSFS score or at least three-point increase in a single activity score indicating functionally significant improvement given minimum detectable change.  Baseline: 1.3 total score (See above for individual activity scores) Goal status: INITIAL  ASSESSMENT:  CLINICAL IMPRESSION: Patient is a 68 y.o. y.o. female who was seen today for occupational therapy evaluation for assessment of vision related impairments to ADLs and IADLs. Hx includes Severe Glaucoma, CVA, MDD, PTSD, HTN, asthma, Hepatitis, seizures, chronic back pain, cocaine abuse, and alcohol abuse. Patient currently reports difficulty with reading, writing, and leisure activities as a part of functional deficits and impairments as noted below. Pt to benefit from skilled OT services for education on visual impairment(s), compensatory and  safety strategies, and use of AD as needed for more independent and safe completion of daily occupations.   PERFORMANCE DEFICITS: in functional skills including ADLs, IADLs, coordination, Fine motor control, mobility, decreased knowledge of precautions, decreased knowledge of use of DME, vision, and UE functional use.   IMPAIRMENTS: are limiting patient from ADLs, IADLs, leisure, and social participation.   CO-MORBIDITIES: may have co-morbidities  that affects occupational performance. Patient will benefit from skilled OT to address above impairments and improve overall function.  MODIFICATION OR ASSISTANCE TO COMPLETE EVALUATION: Min-Moderate modification of tasks or assist with assess necessary to complete an evaluation.  OT OCCUPATIONAL PROFILE AND HISTORY: Detailed assessment: Review of records and additional review of physical, cognitive, psychosocial history related to current functional performance.  CLINICAL DECISION MAKING: Moderate - several treatment options, min-mod task modification necessary  REHAB POTENTIAL: Good  EVALUATION COMPLEXITY: Moderate    PLAN:  OT FREQUENCY: 1x/week  OT DURATION: 8 weeks  PLANNED INTERVENTIONS: self care/ADL training, therapeutic exercise, therapeutic activity, functional mobility training, patient/family education, visual/perceptual remediation/compensation, coping strategies training, DME and/or AE instructions, and Re-evaluation  RECOMMENDED OTHER SERVICES: [facility]  CONSULTED AND AGREED WITH PLAN OF CARE: Patient and family  PLAN FOR NEXT SESSION: Go over low vision packet; cards; sunglasses; crocheting; writing   Jocelyn [name] Bottom, OT 10/25/2023, 4:57 PM

## 2023-10-25 NOTE — Patient Instructions (Signed)
 SABRA

## 2023-11-14 ENCOUNTER — Ambulatory Visit: Attending: Optometry | Admitting: Occupational Therapy

## 2023-11-14 DIAGNOSIS — R41842 Visuospatial deficit: Secondary | ICD-10-CM | POA: Insufficient documentation

## 2023-11-14 DIAGNOSIS — H409 Unspecified glaucoma: Secondary | ICD-10-CM | POA: Insufficient documentation

## 2023-11-14 DIAGNOSIS — H543 Unqualified visual loss, both eyes: Secondary | ICD-10-CM | POA: Insufficient documentation

## 2023-11-14 NOTE — Therapy (Signed)
 OUTPATIENT OCCUPATIONAL THERAPY LOW VISION TREATMENT  Patient Name: [name] MRN: [id number] DOB:[birth year], 68 y.o., female Today's Date: 11/14/2023  PCP: [name]  REFERRING PROVIDER: Octavia Bruckner, MD  END OF SESSION:  OT End of Session - 11/14/23 1111     Visit Number 2    Number of Visits 9    Date for OT Re-Evaluation 12/22/23    Authorization Type Medicare    OT Start Time 1108    OT Stop Time 1146    OT Time Calculation (min) 38 min    Activity Tolerance Patient tolerated treatment well    Behavior During Therapy WFL for tasks assessed/performed         Past Medical History:  Diagnosis Date   Alcohol abuse    Chronic back pain    Colitis    CVA (cerebral infarction)    Fatty liver    Gastric ulcer    GI bleed    Hepatitis    Hepatitis C   MDD (major depressive disorder)    Pancreatitis    PTSD (post-traumatic stress disorder)    Seizures (HCC)    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) 11/05/2015   Stroke (HCC)    UTI (lower urinary tract infection)    Vision abnormalities    Past Surgical History:  Procedure Laterality Date   ABDOMINAL HYSTERECTOMY     APPENDECTOMY     COLONOSCOPY WITH PROPOFOL  N/A 08/13/2015   Procedure: COLONOSCOPY WITH PROPOFOL ;  Surgeon: Alexa SHAUNNA Cedar, MD;  Location: [facility];  Service: Endoscopy;  Laterality: N/A;   ESOPHAGOGASTRODUODENOSCOPY N/A 05/23/2014   Procedure: ESOPHAGOGASTRODUODENOSCOPY (EGD);  Surgeon: [name] Starch, MD;  Location: [facility] ENDOSCOPY;  Service: Endoscopy;  Laterality: N/A;   ESOPHAGOGASTRODUODENOSCOPY (EGD) WITH PROPOFOL  N/A 08/13/2015   Procedure: ESOPHAGOGASTRODUODENOSCOPY (EGD) WITH PROPOFOL ;  Surgeon: Alexa SHAUNNA Cedar, MD;  Location: [facility];  Service: Endoscopy;  Laterality: N/A;   ESOPHAGOGASTRODUODENOSCOPY ENDOSCOPY     gall stone removed     Patient Active Problem List   Diagnosis Date Noted   Mallet toe, acquired, unspecified laterality 11/26/2021   Callus  08/23/2021   Pincer nail deformity 05/19/2021   Hypotension 01/28/2021   AKI (acute kidney injury) (HCC) 01/28/2021   Prolonged QT interval 01/28/2021   Influenza A 01/28/2021   Plantar fasciitis of right foot 01/15/2021   Hyperlipidemia 11/03/2016   Former smoker 01/11/2016   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/05/2015   Left hip pain    History of GI bleed    Spastic bladder    Chronic back pain    ETOH abuse    Hypokalemia    PTSD (post-traumatic stress disorder)    Gastroesophageal reflux disease without esophagitis    Acute ischemic stroke (HCC) 09/27/2015   Acute CVA (cerebrovascular accident) (HCC) 09/27/2015   Smoker    Left-sided weakness 09/26/2015   Acute gastric ulcer    GI bleed 05/22/2014   Acute GI bleeding 05/22/2014   Acute blood loss anemia 05/22/2014   History of stroke 05/22/2014   Mixed hyperlipidemia 05/22/2014   Gastrointestinal hemorrhage with melena    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) 10/09/2012   Flank pain 10/09/2012   Right pontine stroke (HCC) 10/09/2012   ABDOMINAL PAIN OTHER SPECIFIED SITE 05/17/2010   Alcohol abuse 06/16/2007   Cocaine abuse (HCC) 06/16/2007   DEPRESSION 06/16/2007   Essential hypertension 06/16/2007   Cerebral artery occlusion with  cerebral infarction (HCC) 06/16/2007   Asthma 06/16/2007   GERD 06/16/2007   PANCREATITIS 11/09/2006   ESOPHAGITIS, REFLUX 05/23/2006    ONSET DATE: 09/11/2023 (Date of referral); 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   Pt accompanied by: self  PERTINENT HISTORY: PMH: Severe Glaucoma, CVA, MDD, PTSD, HTN, asthma, Hepatitis, seizures, chronic back pain, cocaine  abuse, and alcohol abuse  PAIN:  Are you having pain? No  FALLS: Has patient fallen in last 6 months? Yes. 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  - gets locked out, hard to see  PHONE: flip phone with large buttons for phone calls  HAND DOMINANCE: Left  ADLs: Overall ADLs: mod I  IADLs: Shopping: walks around [place] to pick out items Light housekeeping: sweeps floors and cleans tables, feels surfaces to make sure it's fully clean Meal Prep: uses microwave, liked to cook spare ribs and deviled eggs Community mobility: dependent Medication management: mod I Financial management: dependent Handwriting: hasn't written well since her stroke  MOBILITY STATUS: Needs Assist: rollator and SBA for safety  FUNCTIONAL OUTCOME MEASURES: PSFS: 1.3 total score   COGNITION: Overall cognitive status: Within functional limits for tasks assessed  VISUAL FINDINGS: Baseline vision: wears glasses for TV Visual history: glaucoma, cataracts, and corrective eye surgery  OBSERVATIONS: Pt ambulates with use of rollator. No loss of balance though requires tactile and verbal cues to navigate clinic environment.  TODAY'S TREATMENT:                                                                                                                              OT educated pt on tablet modifications including contrast, larger text, and use of  magnification. Visual anchor placed beside controller to turn feature on and off. Pt requiring multimodal cueing for use. Unable to put widget for [employer] app on home screen. Reviewed modifications with niece after visit and encouraged pt to practice. Would recommend more accessible option for pt.   Discussed recent visit from [facility] including AD she will be receiving: talking clock, talking watch, and magnifier.   Discussed flip phone use including navigation of keys, use of quick key options, and how  to view recent calls. Pt able to view recent call from niece and call her back to let her know she was done with therapy.   PATIENT EDUCATION: Education details: tablet and phone use, [facility] Person educated: Patient Education method: Explanation; demonstrated; visual, verbal, and tactile cues,  handouts  Education comprehension: verbalized understanding, returned demonstration requiring multimodal cueing, and needs further education  HOME EXERCISE PROGRAM: 10/25/2023: general low vision strategies  GOALS:  SHORT TERM GOALS: Target date: 11/22/2023    Patient will independently recall at least 2 compensatory strategies for visual impairment without cueing. Baseline: Goal status: INITIAL  2.  Patient will return demonstration of visual adaptation use and verbalize understanding of how to obtain item(s) if desired.  Baseline:  Goal status: IN PROGRESS  LONG TERM GOALS: Target date: 12/22/2023    Patient will report at least two-point increase in average PSFS score or at least three-point increase in a single activity score indicating functionally significant improvement given minimum detectable change.  Baseline: 1.3 total score (See above for individual activity scores) Goal status: INITIAL  ASSESSMENT:  CLINICAL IMPRESSION: Patient demonstrates difficulty utilizing tablet and phone independently requiring though is able to return demonstration of  recommendations. She will require additional education with these devices to promote competency.   PERFORMANCE DEFICITS: in functional skills including ADLs, IADLs, coordination, Fine motor control, mobility, decreased knowledge of precautions, decreased knowledge of use of DME, vision, and UE functional use.   IMPAIRMENTS: are limiting patient from ADLs, IADLs, leisure, and social participation.   CO-MORBIDITIES: may have co-morbidities  that affects occupational performance. Patient will benefit from skilled OT to address above impairments and improve overall function.  REHAB POTENTIAL: Good  PLAN:  OT FREQUENCY: 1x/week  OT DURATION: 8 weeks  PLANNED INTERVENTIONS: self care/ADL training, therapeutic exercise, therapeutic activity, functional mobility training, patient/family education, visual/perceptual remediation/compensation, coping strategies training, DME and/or AE instructions, and Re-evaluation  RECOMMENDED OTHER SERVICES: [facility]  CONSULTED AND AGREED WITH PLAN OF CARE: Patient and family  PLAN FOR NEXT SESSION: phone; Go over low vision packet PRN; cards; sunglasses; crocheting; writing; tablet as needed   Jocelyn [name] Bottom, OT 11/14/2023, 11:12 AM

## 2023-11-22 ENCOUNTER — Ambulatory Visit: Admitting: Occupational Therapy

## 2023-11-22 DIAGNOSIS — H409 Unspecified glaucoma: Secondary | ICD-10-CM

## 2023-11-22 DIAGNOSIS — H543 Unqualified visual loss, both eyes: Secondary | ICD-10-CM

## 2023-11-22 DIAGNOSIS — R41842 Visuospatial deficit: Secondary | ICD-10-CM

## 2023-11-22 NOTE — Therapy (Unsigned)
 OUTPATIENT OCCUPATIONAL THERAPY LOW VISION TREATMENT  Patient Name: [name] MRN: [id number] DOB:04/13/1955, 68 y.o., female Today's Date: 11/22/2023  PCP: [name]  REFERRING PROVIDER: Octavia Bruckner, MD  END OF SESSION:  OT End of Session - 11/22/23 1233     Visit Number 3    Number of Visits 9    Date for OT Re-Evaluation 12/22/23    Authorization Type Medicare    OT Start Time 1148    OT Stop Time 1235    OT Time Calculation (min) 47 min    Activity Tolerance Patient tolerated treatment well    Behavior During Therapy WFL for tasks assessed/performed          Past Medical History:  Diagnosis Date   Alcohol abuse    Chronic back pain    Colitis    CVA (cerebral infarction)    Fatty liver    Gastric ulcer    GI bleed    Hepatitis    Hepatitis C   MDD (major depressive disorder)    Pancreatitis    PTSD (post-traumatic stress disorder)    Seizures (HCC)    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) 11/05/2015   Stroke (HCC)    UTI (lower urinary tract infection)    Vision abnormalities    Past Surgical History:  Procedure Laterality Date   ABDOMINAL HYSTERECTOMY     APPENDECTOMY     COLONOSCOPY WITH PROPOFOL  N/A 08/13/2015   Procedure: COLONOSCOPY WITH PROPOFOL ;  Surgeon: Toribio SHAUNNA Cedar, MD;  Location: [facility] ENDOSCOPY;  Service: Endoscopy;  Laterality: N/A;   ESOPHAGOGASTRODUODENOSCOPY N/A 05/23/2014   Procedure: ESOPHAGOGASTRODUODENOSCOPY (EGD);  Surgeon: Gordy CHRISTELLA Starch, MD;  Location: [facility] ENDOSCOPY;  Service: Endoscopy;  Laterality: N/A;   ESOPHAGOGASTRODUODENOSCOPY (EGD) WITH PROPOFOL  N/A 08/13/2015   Procedure: ESOPHAGOGASTRODUODENOSCOPY (EGD) WITH PROPOFOL ;  Surgeon: Toribio SHAUNNA Cedar, MD;  Location: [facility] ENDOSCOPY;  Service: Endoscopy;  Laterality: N/A;   ESOPHAGOGASTRODUODENOSCOPY ENDOSCOPY     gall stone removed     Patient Active Problem List   Diagnosis Date Noted   Mallet toe, acquired, unspecified laterality 11/26/2021    Callus 08/23/2021   Pincer nail deformity 05/19/2021   Hypotension 01/28/2021   AKI (acute kidney injury) (HCC) 01/28/2021   Prolonged QT interval 01/28/2021   Influenza A 01/28/2021   Plantar fasciitis of right foot 01/15/2021   Hyperlipidemia 11/03/2016   Former smoker 01/11/2016   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/05/2015   Left hip pain    History of GI bleed    Spastic bladder    Chronic back pain    ETOH abuse    Hypokalemia    PTSD (post-traumatic stress disorder)    Gastroesophageal reflux disease without esophagitis    Acute ischemic stroke (HCC) 09/27/2015   Acute CVA (cerebrovascular accident) (HCC) 09/27/2015   Smoker    Left-sided weakness 09/26/2015   Acute gastric ulcer    GI bleed 05/22/2014   Acute GI bleeding 05/22/2014   Acute blood loss anemia 05/22/2014   History of stroke 05/22/2014   Mixed hyperlipidemia 05/22/2014   Gastrointestinal hemorrhage with melena    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) 10/09/2012   Flank pain 10/09/2012   Right pontine stroke (HCC) 10/09/2012   ABDOMINAL PAIN OTHER SPECIFIED SITE 05/17/2010   Alcohol abuse 06/16/2007   Cocaine abuse (HCC) 06/16/2007   DEPRESSION 06/16/2007   Essential hypertension 06/16/2007   Cerebral artery occlusion  with cerebral infarction (HCC) 06/16/2007   Asthma 06/16/2007   GERD 06/16/2007   PANCREATITIS 11/09/2006   ESOPHAGITIS, REFLUX 05/23/2006    ONSET DATE: 09/11/2023 (Date of referral); 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   Pt accompanied by: self  PERTINENT HISTORY: PMH: Severe  Glaucoma, CVA, MDD, PTSD, HTN, asthma, Hepatitis, seizures, chronic back pain, cocaine abuse, and alcohol abuse  PAIN:  Are you having pain? No  FALLS: Has patient fallen in last 6 months? Yes. 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  - gets locked out, hard to see  PHONE: flip phone with large buttons for phone calls  HAND DOMINANCE: Left  ADLs: Overall ADLs: mod I  IADLs: Shopping: walks around [place] to pick out items Light housekeeping: sweeps floors and cleans tables, feels surfaces to make sure it's fully clean Meal Prep: uses microwave, liked to cook spare ribs and deviled eggs Community mobility: dependent Medication management: mod I Financial management: dependent Handwriting: hasn't written well since her stroke  MOBILITY STATUS: Needs Assist: rollator and SBA for safety  FUNCTIONAL OUTCOME MEASURES: PSFS: 1.3 total score   COGNITION: Overall cognitive status: Within functional limits for tasks assessed  VISUAL FINDINGS: Baseline vision: wears glasses for TV Visual history: glaucoma, cataracts, and corrective eye surgery  OBSERVATIONS: Pt ambulates with use of rollator. No loss of balance though requires tactile and verbal cues to navigate clinic environment.  TODAY'S TREATMENT:                                                                                                                               OT educated pt on tablet modifications including contrast, larger text, and use of magnification. Visual anchor placed beside controller to turn feature on and off. Pt requiring multimodal cueing for use. Unable to put widget for [employer] app on home screen. Reviewed modifications with niece after visit and encouraged pt to practice. Would recommend more accessible option for pt. *** Niece tried  OT educated pt on table top play of [employer]ita[employer] to address scanning and locating of items, processing, bimanual  coordination/trunk control, and item/pattern recognition. Pt required moderate cues for proper play with use of Extra Large Face Cards.   PATIENT EDUCATION: Education details: tablet and phone use, [facility] Person educated: Patient Education method: Explanation; demonstrated; visual, verbal, and tactile cues,  handouts  Education comprehension: verbalized understanding, returned demonstration requiring multimodal cueing, and needs further education  HOME EXERCISE PROGRAM: 10/25/2023: general low vision strategies 11/22/2023: [employer] Solitaire  GOALS:  SHORT TERM GOALS: Target date: 11/22/2023    Patient will independently recall at least 2 compensatory strategies for visual impairment without cueing. Baseline: Goal status: INITIAL  2.  Patient will return demonstration of visual adaptation use and verbalize understanding of how to obtain item(s) if desired.  Baseline:  Goal status: IN PROGRESS  LONG TERM GOALS: Target date: 12/22/2023    Patient will report at least two-point increase in average PSFS score or at least three-point increase in a single activity score indicating functionally significant improvement given minimum detectable change.  Baseline: 1.3 total score (See above for individual activity scores) Goal status: INITIAL  ASSESSMENT:  CLINICAL IMPRESSION: Patient demonstrates fair understanding of adaptive card game requiring mod cueing and  increased time. She will require additional education with these devices to promote competency.   PERFORMANCE DEFICITS: in functional skills including ADLs, IADLs, coordination, Fine motor control, mobility, decreased knowledge of precautions, decreased knowledge of use of DME, vision, and UE functional use.   IMPAIRMENTS: are limiting patient from ADLs, IADLs, leisure, and social participation.   CO-MORBIDITIES: may have co-morbidities  that affects occupational performance. Patient will benefit from skilled OT to address above impairments and improve overall function.  REHAB POTENTIAL: Good  PLAN:  OT FREQUENCY: 1x/week  OT DURATION: 8 weeks  PLANNED INTERVENTIONS: self care/ADL training, therapeutic exercise, therapeutic activity, functional mobility training, patient/family education, visual/perceptual remediation/compensation, coping strategies training, DME and/or AE instructions, and Re-evaluation  RECOMMENDED OTHER SERVICES: [facility]  CONSULTED AND AGREED WITH PLAN OF CARE: Patient and family  PLAN FOR NEXT SESSION: Niece can be present for appointments but prefers to sit in blue [place] Minivan in parking lot to allow her aunt to focus on therapy session. Can be reached by phone call using pt's phone.   For appt on 9/16 with Monica: functional mobility, environmental scanning, rollator safety  Subsequent visits: phone; Go over low vision packet PRN; cards PRN; sunglasses; crocheting; writing; tablet as needed   Jocelyn CHRISTELLA Bottom, OT 11/22/2023, 12:33 PM

## 2023-11-27 ENCOUNTER — Ambulatory Visit: Admitting: Occupational Therapy

## 2023-11-28 ENCOUNTER — Encounter: Payer: Self-pay | Admitting: Occupational Therapy

## 2023-11-28 ENCOUNTER — Ambulatory Visit: Admitting: Occupational Therapy

## 2023-11-28 DIAGNOSIS — H543 Unqualified visual loss, both eyes: Secondary | ICD-10-CM

## 2023-11-28 DIAGNOSIS — R41842 Visuospatial deficit: Secondary | ICD-10-CM | POA: Diagnosis not present

## 2023-11-28 NOTE — Therapy (Signed)
 OUTPATIENT OCCUPATIONAL THERAPY LOW VISION TREATMENT  Patient Name: [name] MRN: [id number] DOB:Oct 06, 1955, 68 y.o., female Today's Date: 11/28/2023  PCP: [name]  REFERRING PROVIDER: Octavia Bruckner, MD  END OF SESSION:  OT End of Session - 11/28/23 1532     OT Start Time 1317    OT Stop Time 1400    OT Time Calculation (min) 43 min    Activity Tolerance Patient tolerated treatment well    Behavior During Therapy WFL for tasks assessed/performed          Past Medical History:  Diagnosis Date   Alcohol abuse    Chronic back pain    Colitis    CVA (cerebral infarction)    Fatty liver    Gastric ulcer    GI bleed    Hepatitis    Hepatitis C   MDD (major depressive disorder)    Pancreatitis    PTSD (post-traumatic stress disorder)    Seizures (HCC)    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) 11/05/2015   Stroke (HCC)    UTI (lower urinary tract infection)    Vision abnormalities    Past Surgical History:  Procedure Laterality Date   ABDOMINAL HYSTERECTOMY     APPENDECTOMY     COLONOSCOPY WITH PROPOFOL  N/A 08/13/2015   Procedure: COLONOSCOPY WITH PROPOFOL ;  Surgeon: Toribio SHAUNNA Cedar, MD;  Location: [facility] ENDOSCOPY;  Service: Endoscopy;  Laterality: N/A;   ESOPHAGOGASTRODUODENOSCOPY N/A 05/23/2014   Procedure: ESOPHAGOGASTRODUODENOSCOPY (EGD);  Surgeon: Gordy CHRISTELLA Starch, MD;  Location: [facility] ENDOSCOPY;  Service: Endoscopy;  Laterality: N/A;   ESOPHAGOGASTRODUODENOSCOPY (EGD) WITH PROPOFOL  N/A 08/13/2015   Procedure: ESOPHAGOGASTRODUODENOSCOPY (EGD) WITH PROPOFOL ;  Surgeon: Toribio SHAUNNA Cedar, MD;  Location: [facility] ENDOSCOPY;  Service: Endoscopy;  Laterality: N/A;   ESOPHAGOGASTRODUODENOSCOPY ENDOSCOPY     gall stone removed     Patient Active Problem List   Diagnosis Date Noted   Mallet toe, acquired, unspecified laterality 11/26/2021   Callus 08/23/2021   Pincer nail deformity 05/19/2021   Hypotension 01/28/2021   AKI (acute kidney injury) (HCC)  01/28/2021   Prolonged QT interval 01/28/2021   Influenza A 01/28/2021   Plantar fasciitis of right foot 01/15/2021   Hyperlipidemia 11/03/2016   Former smoker 01/11/2016   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/05/2015   Left hip pain    History of GI bleed    Spastic bladder    Chronic back pain    ETOH abuse    Hypokalemia    PTSD (post-traumatic stress disorder)    Gastroesophageal reflux disease without esophagitis    Acute ischemic stroke (HCC) 09/27/2015   Acute CVA (cerebrovascular accident) (HCC) 09/27/2015   Smoker    Left-sided weakness 09/26/2015   Acute gastric ulcer    GI bleed 05/22/2014   Acute GI bleeding 05/22/2014   Acute blood loss anemia 05/22/2014   History of stroke 05/22/2014   Mixed hyperlipidemia 05/22/2014   Gastrointestinal hemorrhage with melena    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) 10/09/2012   Flank pain 10/09/2012   Right pontine stroke (HCC) 10/09/2012   ABDOMINAL PAIN OTHER SPECIFIED SITE 05/17/2010   Alcohol abuse 06/16/2007   Cocaine abuse (HCC) 06/16/2007   DEPRESSION 06/16/2007   Essential hypertension 06/16/2007   Cerebral artery occlusion with cerebral infarction (HCC) 06/16/2007   Asthma 06/16/2007   GERD 06/16/2007   PANCREATITIS 11/09/2006   ESOPHAGITIS, REFLUX 05/23/2006    ONSET DATE:  09/11/2023 (Date of referral); 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   Pt accompanied by: self  PERTINENT HISTORY: PMH: Severe Glaucoma, CVA, MDD, PTSD, HTN, asthma, Hepatitis, seizures, chronic back pain, cocaine abuse, and alcohol abuse  PAIN:  Are you having  pain? No  FALLS: Has patient fallen in last 6 months? Yes. 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  - gets locked out, hard to see  PHONE: flip phone with large buttons for phone calls  HAND DOMINANCE: Left  ADLs: Overall ADLs: mod I  IADLs: Shopping: walks around [place] to pick out items Light housekeeping: sweeps floors and cleans tables, feels surfaces to make sure it's fully clean Meal Prep: uses microwave, liked to cook spare ribs and deviled eggs Community mobility: dependent Medication management: mod I Financial management: dependent Handwriting: hasn't written well since her stroke  MOBILITY STATUS: Needs Assist: rollator and SBA for safety  FUNCTIONAL OUTCOME MEASURES: PSFS: 1.3 total score   COGNITION: Overall cognitive status: Within functional limits for tasks assessed  VISUAL FINDINGS: Baseline vision: wears glasses for TV Visual history: glaucoma, cataracts, and corrective eye surgery  OBSERVATIONS: Pt ambulates with use of rollator. No loss of balance though requires tactile and verbal cues to navigate clinic environment.  TODAY'S TREATMENT:    11/28/2023 Patient and niece indicate that tablet is not working properly. Patient talked about [employer] getting her sunglasses, clock, and magnifiers.  Practiced using various magnifiers in the clinic to assess effectiveness.  Patient has  limited reading history.  Patient did indicate need to compare prices on like items at the grocery store.  Patient had best use of 4x magnification for ~ 18 font.   Worked on functional mobility in kitchen setting - new environment for patient.  Worked on sorting items by shape and size, safe mobility around countertops.  Patient reports limited use of rollator in familiar home environment.  She reports using more when walking outside and in new environments.     PATIENT EDUCATION: Education details: tablet and phone use, golf solitaire Person educated: Patient Education method: Explanation; demonstrated; visual, verbal, and tactile cues,  handouts  Education comprehension: verbalized understanding, returned demonstration requiring multimodal cueing, and needs further education  HOME EXERCISE PROGRAM: 10/25/2023: general low vision strategies 11/22/2023: [employer] Solitaire  GOALS:  SHORT TERM GOALS: Target date: 11/22/2023    Patient will independently recall at least 2 compensatory strategies for visual impairment without cueing. Baseline: Goal status: INITIAL  2.  Patient will return demonstration of visual adaptation use and verbalize understanding of how to obtain item(s) if desired.  Baseline:  Goal status: IN PROGRESS  LONG TERM GOALS: Target  date: 12/22/2023    Patient will report at least two-point increase in average PSFS score or at least three-point increase in a single activity score indicating functionally significant improvement given minimum detectable change.  Baseline: 1.3 total score (See above for individual activity scores) Goal status: INITIAL  ASSESSMENT:  CLINICAL IMPRESSION: Patient demonstrates fair understanding of adaptive card game requiring mod cueing and increased time. She will require additional education with these devices to promote competency.   PERFORMANCE DEFICITS: in functional skills including ADLs, IADLs, coordination, Fine motor control,  mobility, decreased knowledge of precautions, decreased knowledge of use of DME, vision, and UE functional use.   IMPAIRMENTS: are limiting patient from ADLs, IADLs, leisure, and social participation.   CO-MORBIDITIES: may have co-morbidities  that affects occupational performance. Patient will benefit from skilled OT to address above impairments and improve overall function.  REHAB POTENTIAL: Good  PLAN:  OT FREQUENCY: 1x/week  OT DURATION: 8 weeks  PLANNED INTERVENTIONS: self care/ADL training, therapeutic exercise, therapeutic activity, functional mobility training, patient/family education, visual/perceptual remediation/compensation, coping strategies training, DME and/or AE instructions, and Re-evaluation  RECOMMENDED OTHER SERVICES: [facility]  CONSULTED AND AGREED WITH PLAN OF CARE: Patient and family  PLAN FOR NEXT SESSION: Niece can be present for appointments but prefers to sit in blue [place] Minivan in parking lot to allow her aunt to focus on therapy session. Can be reached by phone call using pt's phone.   For appt on 9/16 with Monica: functional mobility, environmental scanning, rollator safety  Subsequent visits: phone; Go over low vision packet PRN; cards PRN; sunglasses; crocheting; writing; tablet as needed   Ramandeep Arington M, OT 11/28/2023, 3:32 PM

## 2023-12-04 ENCOUNTER — Encounter: Payer: Self-pay | Admitting: Podiatry

## 2023-12-04 ENCOUNTER — Ambulatory Visit (INDEPENDENT_AMBULATORY_CARE_PROVIDER_SITE_OTHER): Admitting: Podiatry

## 2023-12-04 DIAGNOSIS — M79674 Pain in right toe(s): Secondary | ICD-10-CM

## 2023-12-04 DIAGNOSIS — B351 Tinea unguium: Secondary | ICD-10-CM | POA: Diagnosis not present

## 2023-12-04 DIAGNOSIS — M79675 Pain in left toe(s): Secondary | ICD-10-CM | POA: Diagnosis not present

## 2023-12-04 DIAGNOSIS — I739 Peripheral vascular disease, unspecified: Secondary | ICD-10-CM

## 2023-12-04 NOTE — Progress Notes (Signed)
  Subjective:  Patient ID: [name], female    DOB: [birth year],   MRN: [id number]  Chief Complaint  Patient presents with   Nail Problem    I need my nails clipped and filed.    68 y.o. female presents for concern of thickened elongated and painful nails that are difficult to trim. Requesting to have them trimmed today. History of PAD and clotting disorder and at risk for foot care.   PCP:  Sim Emery CROME, MD    . Denies any other pedal complaints. Denies n/v/f/c.   Past Medical History:  Diagnosis Date   Alcohol abuse    Chronic back pain    Colitis    CVA (cerebral infarction)    Fatty liver    Gastric ulcer    GI bleed    Hepatitis    Hepatitis C   MDD (major depressive disorder)    Pancreatitis    PTSD (post-traumatic stress disorder)    Seizures (HCC)    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) 11/05/2015   Stroke (HCC)    UTI (lower urinary tract infection)    Vision abnormalities     Objective:  Physical Exam: Vascular: DP/PT pulses 1/4 bilateral. CFT <3 seconds. Normal hair growth on digits. No edema.  Skin. No lacerations or abrasions bilateral feet. Nails 1-5 bilateral are thickened elongated and dystrophic.   Musculoskeletal: MMT 5/5 bilateral lower extremities in DF, PF, Inversion and Eversion. Deceased ROM in DF of ankle joint.  Neurological: Sensation intact to light touch.   Assessment:   1. Pain due to onychomycosis of toenails of both feet   2. PAD (peripheral artery disease) (HCC)       Plan:  Patient was evaluated and treated and all questions answered. -Mechanically debrided all nails 1-5 bilateral using sterile nail nipper and filed with dremel without incident  -Answered all patient questions -Patient to return  in 3 months for at risk foot care -Patient advised to call the office if any problems or questions arise in the meantime.   Asberry Failing, DPM

## 2023-12-06 ENCOUNTER — Ambulatory Visit: Admitting: Occupational Therapy

## 2023-12-06 DIAGNOSIS — H409 Unspecified glaucoma: Secondary | ICD-10-CM

## 2023-12-06 DIAGNOSIS — H543 Unqualified visual loss, both eyes: Secondary | ICD-10-CM

## 2023-12-06 DIAGNOSIS — R41842 Visuospatial deficit: Secondary | ICD-10-CM

## 2023-12-06 NOTE — Therapy (Signed)
 OUTPATIENT OCCUPATIONAL THERAPY LOW VISION TREATMENT  Patient Name: [name] MRN: [id number] DOB:[birth year], 68 y.o., female Today's Date: 12/06/2023  PCP: [name]  REFERRING PROVIDER: Octavia Bruckner, MD  END OF SESSION:  OT End of Session - 12/06/23 1151     Visit Number 5    Number of Visits 9    Date for Recertification  12/22/23    Authorization Type Medicare    OT Start Time 1149    OT Stop Time 1229    OT Time Calculation (min) 40 min    Activity Tolerance Patient tolerated treatment well    Behavior During Therapy WFL for tasks assessed/performed          Past Medical History:  Diagnosis Date   Alcohol abuse    Chronic back pain    Colitis    CVA (cerebral infarction)    Fatty liver    Gastric ulcer    GI bleed    Hepatitis    Hepatitis C   MDD (major depressive disorder)    Pancreatitis    PTSD (post-traumatic stress disorder)    Seizures (HCC)    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) 11/05/2015   Stroke (HCC)    UTI (lower urinary tract infection)    Vision abnormalities    Past Surgical History:  Procedure Laterality Date   ABDOMINAL HYSTERECTOMY     APPENDECTOMY     COLONOSCOPY WITH PROPOFOL  N/A 08/13/2015   Procedure: COLONOSCOPY WITH PROPOFOL ;  Surgeon: Toribio SHAUNNA Cedar, MD;  Location: [facility];  Service: Endoscopy;  Laterality: N/A;   ESOPHAGOGASTRODUODENOSCOPY N/A 05/23/2014   Procedure: ESOPHAGOGASTRODUODENOSCOPY (EGD);  Surgeon: Gordy CHRISTELLA Starch, MD;  Location: [facility] ENDOSCOPY;  Service: Endoscopy;  Laterality: N/A;   ESOPHAGOGASTRODUODENOSCOPY (EGD) WITH PROPOFOL  N/A 08/13/2015   Procedure: ESOPHAGOGASTRODUODENOSCOPY (EGD) WITH PROPOFOL ;  Surgeon: Toribio SHAUNNA Cedar, MD;  Location: [facility];  Service: Endoscopy;  Laterality: N/A;   ESOPHAGOGASTRODUODENOSCOPY ENDOSCOPY     gall stone removed     Patient Active Problem List   Diagnosis Date Noted   Mallet toe, acquired, unspecified laterality 11/26/2021    Callus 08/23/2021   Pincer nail deformity 05/19/2021   Hypotension 01/28/2021   AKI (acute kidney injury) 01/28/2021   Prolonged QT interval 01/28/2021   Influenza A 01/28/2021   Plantar fasciitis of right foot 01/15/2021   Hyperlipidemia 11/03/2016   Former smoker 01/11/2016   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/05/2015   Left hip pain    History of GI bleed    Spastic bladder    Chronic back pain    ETOH abuse    Hypokalemia    PTSD (post-traumatic stress disorder)    Gastroesophageal reflux disease without esophagitis    Acute ischemic stroke (HCC) 09/27/2015   Acute CVA (cerebrovascular accident) (HCC) 09/27/2015   Smoker    Left-sided weakness 09/26/2015   Acute gastric ulcer    GI bleed 05/22/2014   Acute GI bleeding 05/22/2014   Acute blood loss anemia 05/22/2014   History of stroke 05/22/2014   Mixed hyperlipidemia 05/22/2014   Gastrointestinal hemorrhage with melena    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) 10/09/2012   Flank pain 10/09/2012   Right pontine stroke (HCC) 10/09/2012   ABDOMINAL PAIN OTHER SPECIFIED SITE 05/17/2010   Alcohol abuse 06/16/2007   Cocaine abuse (HCC) 06/16/2007   DEPRESSION 06/16/2007   Essential hypertension 06/16/2007   Cerebral artery occlusion with  cerebral infarction (HCC) 06/16/2007   Asthma 06/16/2007   GERD 06/16/2007   PANCREATITIS 11/09/2006   ESOPHAGITIS, REFLUX 05/23/2006   ONSET DATE: 09/11/2023 (Date of referral); 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   Pt accompanied by: self  PERTINENT HISTORY: PMH: Severe Glaucoma, CVA, MDD, PTSD, HTN, asthma, Hepatitis, seizures, chronic back  pain, cocaine abuse, and alcohol abuse  PAIN:  Are you having pain? No  FALLS: Has patient fallen in last 6 months? Yes. 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  - gets locked out, hard to see  PHONE: flip phone with large buttons for phone calls  HAND DOMINANCE: Left  ADLs: Overall ADLs: mod I  IADLs: Shopping: walks around [place] to pick out items Light housekeeping: sweeps floors and cleans tables, feels surfaces to make sure it's fully clean Meal Prep: uses microwave, liked to cook spare ribs and deviled eggs Community mobility: dependent Medication management: mod I Financial management: dependent Handwriting: hasn't written well since her stroke  MOBILITY STATUS: Needs Assist: rollator and SBA for safety  FUNCTIONAL OUTCOME MEASURES: PSFS: 1.3 total score   COGNITION: Overall cognitive status: Within functional limits for tasks assessed  VISUAL FINDINGS: Baseline vision: wears glasses for TV Visual history: glaucoma, cataracts, and corrective eye surgery  OBSERVATIONS: Pt ambulates with use of rollator. No loss of balance though requires tactile and verbal cues to navigate clinic environment.  TODAY'S TREATMENT:     OT set up speed dials on pt's phone for emergency/commonly used contacts. OT enlarged text on phone for better readability and further improved this with addition of handheld magnifier.  Trialed use of inverted colors though pt did not prefer this. OT wrote contacts for speed dials out on note card to further promote familiarity and recall.    PATIENT EDUCATION: Education details: phone use Person educated: Patient Education method: Explanation; demonstrated; visual, verbal, and tactile cues,  handouts  Education comprehension: verbalized understanding, returned demonstration requiring multimodal cueing, and needs further education  HOME EXERCISE PROGRAM: 10/25/2023: general low vision strategies 11/22/2023: [employer]  GOALS:  SHORT TERM GOALS: Target date: 11/22/2023    Patient will independently recall at least 2 compensatory strategies for visual impairment without cueing. Baseline: Goal status: IN PROGRESS  2.  Patient will return demonstration of visual adaptation use and verbalize understanding of how to obtain item(s) if desired.  Baseline:  Goal status: IN PROGRESS  LONG TERM GOALS: Target date: 12/22/2023    Patient will report at least two-point increase in average PSFS score or at least three-point increase in a single activity score indicating functionally significant improvement given minimum detectable change.  Baseline: 1.3 total score (See above for individual activity scores) Goal status: INITIAL  ASSESSMENT:  CLINICAL IMPRESSION: Patient demonstrates good understanding of phone adaptions though might require review to promote long-term recall.   PERFORMANCE DEFICITS: in functional skills including ADLs, IADLs, coordination, Fine motor control, mobility, decreased knowledge of precautions, decreased knowledge of use of DME, vision, and UE functional use.   IMPAIRMENTS: are limiting patient from ADLs, IADLs, leisure, and social participation.   CO-MORBIDITIES: may have co-morbidities  that affects occupational performance. Patient will benefit from skilled OT to address above impairments and improve overall function.  REHAB POTENTIAL:  Good  PLAN:  OT FREQUENCY: 1x/week  OT DURATION: 8 weeks  PLANNED INTERVENTIONS: self care/ADL training, therapeutic exercise, therapeutic activity, functional mobility training, patient/family education, visual/perceptual remediation/compensation, coping strategies training, DME and/or AE instructions, and Re-evaluation  RECOMMENDED OTHER SERVICES: [facility]  CONSULTED AND AGREED WITH PLAN OF CARE: Patient and family  PLAN FOR NEXT SESSION: Niece can be present for appointments but prefers to sit in blue [place] Minivan in parking lot to allow her aunt to focus on therapy session. Can be reached by phone call using pt's phone.   Subsequent visits: Go over low vision packet PRN; cards PRN; sunglasses; crocheting; writing; tablet as needed   Jocelyn CHRISTELLA Bottom, OT 12/06/2023, 1:18 PM

## 2023-12-11 ENCOUNTER — Ambulatory Visit: Admitting: Occupational Therapy

## 2023-12-11 DIAGNOSIS — R41842 Visuospatial deficit: Secondary | ICD-10-CM

## 2023-12-11 DIAGNOSIS — H409 Unspecified glaucoma: Secondary | ICD-10-CM

## 2023-12-11 DIAGNOSIS — H543 Unqualified visual loss, both eyes: Secondary | ICD-10-CM

## 2023-12-11 NOTE — Therapy (Unsigned)
 OUTPATIENT OCCUPATIONAL THERAPY LOW VISION TREATMENT  Patient Name: [name] MRN: [id number] DOB:June 14, 1955, 68 y.o., female Today's Date: 12/11/2023  PCP: [name]  REFERRING PROVIDER: Octavia Bruckner, MD  END OF SESSION:  OT End of Session - 12/11/23 1228     Visit Number 6    Number of Visits 9    Date for Recertification  12/22/23    Authorization Type Medicare    OT Start Time 1149    OT Stop Time 1230    OT Time Calculation (min) 41 min    Activity Tolerance Patient tolerated treatment well    Behavior During Therapy WFL for tasks assessed/performed          Past Medical History:  Diagnosis Date   Alcohol abuse    Chronic back pain    Colitis    CVA (cerebral infarction)    Fatty liver    Gastric ulcer    GI bleed    Hepatitis    Hepatitis C   MDD (major depressive disorder)    Pancreatitis    PTSD (post-traumatic stress disorder)    Seizures (HCC)    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) 11/05/2015   Stroke (HCC)    UTI (lower urinary tract infection)    Vision abnormalities    Past Surgical History:  Procedure Laterality Date   ABDOMINAL HYSTERECTOMY     APPENDECTOMY     COLONOSCOPY WITH PROPOFOL  N/A 08/13/2015   Procedure: COLONOSCOPY WITH PROPOFOL ;  Surgeon: Toribio SHAUNNA Cedar, MD;  Location: [facility] ENDOSCOPY;  Service: Endoscopy;  Laterality: N/A;   ESOPHAGOGASTRODUODENOSCOPY N/A 05/23/2014   Procedure: ESOPHAGOGASTRODUODENOSCOPY (EGD);  Surgeon: Gordy CHRISTELLA Starch, MD;  Location: [facility] ENDOSCOPY;  Service: Endoscopy;  Laterality: N/A;   ESOPHAGOGASTRODUODENOSCOPY (EGD) WITH PROPOFOL  N/A 08/13/2015   Procedure: ESOPHAGOGASTRODUODENOSCOPY (EGD) WITH PROPOFOL ;  Surgeon: Toribio SHAUNNA Cedar, MD;  Location: [facility];  Service: Endoscopy;  Laterality: N/A;   ESOPHAGOGASTRODUODENOSCOPY ENDOSCOPY     gall stone removed     Patient Active Problem List   Diagnosis Date Noted   Mallet toe, acquired, unspecified laterality 11/26/2021    Callus 08/23/2021   Pincer nail deformity 05/19/2021   Hypotension 01/28/2021   AKI (acute kidney injury) 01/28/2021   Prolonged QT interval 01/28/2021   Influenza A 01/28/2021   Plantar fasciitis of right foot 01/15/2021   Hyperlipidemia 11/03/2016   Former smoker 01/11/2016   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/05/2015   Left hip pain    History of GI bleed    Spastic bladder    Chronic back pain    ETOH abuse    Hypokalemia    PTSD (post-traumatic stress disorder)    Gastroesophageal reflux disease without esophagitis    Acute ischemic stroke (HCC) 09/27/2015   Acute CVA (cerebrovascular accident) (HCC) 09/27/2015   Smoker    Left-sided weakness 09/26/2015   Acute gastric ulcer    GI bleed 05/22/2014   Acute GI bleeding 05/22/2014   Acute blood loss anemia 05/22/2014   History of stroke 05/22/2014   Mixed hyperlipidemia 05/22/2014   Gastrointestinal hemorrhage with melena    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) 10/09/2012   Flank pain 10/09/2012   Right pontine stroke (HCC) 10/09/2012   ABDOMINAL PAIN OTHER SPECIFIED SITE 05/17/2010   Alcohol abuse 06/16/2007   Cocaine abuse (HCC) 06/16/2007   DEPRESSION 06/16/2007   Essential hypertension 06/16/2007   Cerebral artery occlusion with  cerebral infarction (HCC) 06/16/2007   Asthma 06/16/2007   GERD 06/16/2007   PANCREATITIS 11/09/2006   ESOPHAGITIS, REFLUX 05/23/2006   ONSET DATE: 09/11/2023 (Date of referral); 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   Pt accompanied by: self  PERTINENT HISTORY: PMH: Severe Glaucoma, CVA, MDD, PTSD, HTN, asthma, Hepatitis, seizures, chronic back  pain, cocaine abuse, and alcohol abuse  PAIN:  Are you having pain? No  FALLS: Has patient fallen in last 6 months? Yes. 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  - gets locked out, hard to see  PHONE: flip phone with large buttons for phone calls  HAND DOMINANCE: Left  ADLs: Overall ADLs: mod I  IADLs: Shopping: walks around [place] to pick out items Light housekeeping: sweeps floors and cleans tables, feels surfaces to make sure it's fully clean Meal Prep: uses microwave, liked to cook spare ribs and deviled eggs Community mobility: dependent Medication management: mod I Financial management: dependent Handwriting: hasn't written well since her stroke  MOBILITY STATUS: Needs Assist: rollator and SBA for safety  FUNCTIONAL OUTCOME MEASURES: PSFS: 1.3 total score   COGNITION: Overall cognitive status: Within functional limits for tasks assessed  VISUAL FINDINGS: Baseline vision: wears glasses for TV Visual history: glaucoma, cataracts, and corrective eye surgery  OBSERVATIONS: Pt ambulates with use of rollator. No loss of balance though requires tactile and verbal cues to navigate clinic environment.  TODAY'S TREATMENT:     OT set up speed dials on pt's phone for emergency/commonly used contacts. OT enlarged text on phone for better readability and further improved this with addition of handheld magnifier.  Trialed use of inverted colors though pt did not prefer this. OT wrote contacts for speed dials out on note card to further promote familiarity and recall.    PATIENT EDUCATION: Education details: phone use Person educated: Patient Education method: Explanation; demonstrated; visual, verbal, and tactile cues,  handouts  Education comprehension: verbalized understanding, returned demonstration requiring multimodal cueing, and needs further education  HOME EXERCISE PROGRAM: 10/25/2023: general low vision strategies 11/22/2023: [employer]  GOALS:  SHORT TERM GOALS: Target date: 11/22/2023    Patient will independently recall at least 2 compensatory strategies for visual impairment without cueing. Baseline: Goal status: IN PROGRESS  2.  Patient will return demonstration of visual adaptation use and verbalize understanding of how to obtain item(s) if desired.  Baseline:  Goal status: IN PROGRESS  LONG TERM GOALS: Target date: 12/22/2023    Patient will report at least two-point increase in average PSFS score or at least three-point increase in a single activity score indicating functionally significant improvement given minimum detectable change.  Baseline: 1.3 total score (See above for individual activity scores) Goal status: INITIAL  ASSESSMENT:  CLINICAL IMPRESSION: Patient demonstrates good understanding of phone adaptions though might require review to promote long-term recall.   PERFORMANCE DEFICITS: in functional skills including ADLs, IADLs, coordination, Fine motor control, mobility, decreased knowledge of precautions, decreased knowledge of use of DME, vision, and UE functional use.   IMPAIRMENTS: are limiting patient from ADLs, IADLs, leisure, and social participation.   CO-MORBIDITIES: may have co-morbidities  that affects occupational performance. Patient will benefit from skilled OT to address above impairments and improve overall function.  REHAB POTENTIAL:  Good  PLAN:  OT FREQUENCY: 1x/week  OT DURATION: 8 weeks  PLANNED INTERVENTIONS: self care/ADL training, therapeutic exercise, therapeutic activity, functional mobility training, patient/family education, visual/perceptual remediation/compensation, coping strategies training, DME and/or AE instructions, and Re-evaluation  RECOMMENDED OTHER SERVICES: [facility]  CONSULTED AND AGREED WITH PLAN OF CARE: Patient and family  PLAN FOR NEXT SESSION: Niece can be present for appointments but prefers to sit in blue [place] Minivan in parking lot to allow her aunt to focus on therapy session. Can be reached by phone call using pt's phone.   Subsequent visits: Go over low vision packet PRN; cards PRN; sunglasses; crocheting; writing; tablet as needed   Jocelyn CHRISTELLA Bottom, OT 12/11/2023, 12:29 PM

## 2023-12-20 ENCOUNTER — Ambulatory Visit: Attending: Optometry | Admitting: Occupational Therapy

## 2023-12-20 DIAGNOSIS — H543 Unqualified visual loss, both eyes: Secondary | ICD-10-CM | POA: Insufficient documentation

## 2023-12-20 DIAGNOSIS — R41842 Visuospatial deficit: Secondary | ICD-10-CM | POA: Diagnosis present

## 2023-12-20 DIAGNOSIS — H409 Unspecified glaucoma: Secondary | ICD-10-CM | POA: Insufficient documentation

## 2023-12-20 NOTE — Therapy (Signed)
 OUTPATIENT OCCUPATIONAL THERAPY LOW VISION TREATMENT and PROGRESS REPORT  Patient Name: [name] MRN: [id number] DOB:[birth year], 68 y.o., female Today's Date: 12/20/2023  PCP: [name]  REFERRING PROVIDER: Octavia Bruckner, MD  END OF SESSION:  OT End of Session - 12/20/23 1209     Visit Number 7    Number of Visits 15    Date for Recertification  01/31/24    Authorization Type Medicare    OT Start Time 1151    OT Stop Time 1230    OT Time Calculation (min) 39 min    Activity Tolerance Patient tolerated treatment well    Behavior During Therapy WFL for tasks assessed/performed         Past Medical History:  Diagnosis Date   Alcohol abuse    Chronic back pain    Colitis    CVA (cerebral infarction)    Fatty liver    Gastric ulcer    GI bleed    Hepatitis    Hepatitis C   MDD (major depressive disorder)    Pancreatitis    PTSD (post-traumatic stress disorder)    Seizures (HCC)    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) 11/05/2015   Stroke (HCC)    UTI (lower urinary tract infection)    Vision abnormalities    Past Surgical History:  Procedure Laterality Date   ABDOMINAL HYSTERECTOMY     APPENDECTOMY     COLONOSCOPY WITH PROPOFOL  N/A 08/13/2015   Procedure: COLONOSCOPY WITH PROPOFOL ;  Surgeon: Toribio SHAUNNA Cedar, MD;  Location: [facility];  Service: Endoscopy;  Laterality: N/A;   ESOPHAGOGASTRODUODENOSCOPY N/A 05/23/2014   Procedure: ESOPHAGOGASTRODUODENOSCOPY (EGD);  Surgeon: Gordy CHRISTELLA Starch, MD;  Location: [facility] ENDOSCOPY;  Service: Endoscopy;  Laterality: N/A;   ESOPHAGOGASTRODUODENOSCOPY (EGD) WITH PROPOFOL  N/A 08/13/2015   Procedure: ESOPHAGOGASTRODUODENOSCOPY (EGD) WITH PROPOFOL ;  Surgeon: Toribio SHAUNNA Cedar, MD;  Location: [facility]OS[facility];  Service: Endoscopy;  Laterality: N/A;   ESOPHAGOGASTRODUODENOSCOPY ENDOSCOPY     gall stone removed     Patient Active Problem List   Diagnosis Date Noted   Mallet toe, acquired, unspecified laterality  11/26/2021   Callus 08/23/2021   Pincer nail deformity 05/19/2021   Hypotension 01/28/2021   AKI (acute kidney injury) 01/28/2021   Prolonged QT interval 01/28/2021   Influenza A 01/28/2021   Plantar fasciitis of right foot 01/15/2021   Hyperlipidemia 11/03/2016   Former smoker 01/11/2016   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/05/2015   Left hip pain    History of GI bleed    Spastic bladder    Chronic back pain    ETOH abuse    Hypokalemia    PTSD (post-traumatic stress disorder)    Gastroesophageal reflux disease without esophagitis    Acute ischemic stroke (HCC) 09/27/2015   Acute CVA (cerebrovascular accident) (HCC) 09/27/2015   Smoker    Left-sided weakness 09/26/2015   Acute gastric ulcer    GI bleed 05/22/2014   Acute GI bleeding 05/22/2014   Acute blood loss anemia 05/22/2014   History of stroke 05/22/2014   Mixed hyperlipidemia 05/22/2014   Gastrointestinal hemorrhage with melena    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) 10/09/2012   Flank pain 10/09/2012   Right pontine stroke (HCC) 10/09/2012   ABDOMINAL PAIN OTHER SPECIFIED SITE 05/17/2010   Alcohol abuse 06/16/2007   Cocaine abuse (HCC) 06/16/2007   DEPRESSION 06/16/2007   Essential hypertension 06/16/2007   Cerebral artery  occlusion with cerebral infarction (HCC) 06/16/2007   Asthma 06/16/2007   GERD 06/16/2007   PANCREATITIS 11/09/2006   ESOPHAGITIS, REFLUX 05/23/2006   ONSET DATE: 09/11/2023 (Date of referral); 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 She is still learning how to use it.   Pt accompanied by: self  PERTINENT HISTORY: PMH: Severe Glaucoma, CVA, MDD, PTSD, HTN, asthma,  Hepatitis, seizures, chronic back pain, cocaine abuse, and alcohol abuse  PAIN:  Are you having pain? No  FALLS: Has patient fallen in last 6 months? Yes. 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  - gets locked out, hard to see  PHONE: flip phone with large buttons for phone calls  HAND DOMINANCE: Left  ADLs: Overall ADLs: mod I  IADLs: Shopping: walks around [place] to pick out items Light housekeeping: sweeps floors and cleans tables, feels surfaces to make sure it's fully clean Meal Prep: uses microwave, liked to cook spare ribs and deviled eggs Community mobility: dependent Medication management: mod I Financial management: dependent Handwriting: hasn't written well since her stroke  MOBILITY STATUS: Needs Assist: rollator and SBA for safety  FUNCTIONAL OUTCOME MEASURES: PSFS: 1.3 total score   COGNITION: Overall cognitive status: Within functional limits for tasks assessed  VISUAL FINDINGS: Baseline vision: wears glasses for TV Visual history: glaucoma, cataracts, and corrective eye surgery  OBSERVATIONS: Pt ambulates with use of rollator. No loss of balance though requires tactile and verbal cues to navigate clinic environment.  TODAY'S TREATMENT:     Client was educated on tablet accessibility features to support low vision needs. Training included:  Pt was instructed on how to use tablet case/stand  for table top reading. As case was all black, OT placed orange Duct tape on tab at back to identify where to place opposite end.   OT used tactile cue over long button to allow pt to distinguish between volume and power controls.   OT educated pt and niece on need for tablet case to have opening for tablet camera to use with Be My Eyes.   Pt utilized speed dial on phone to call niece to let her know she was done with her appointment. Required only 2 attempts and min cuing for proper completion.   PATIENT EDUCATION: Education details: phone and tablet use; POC extension Person educated: Patient Education method: Explanation; demonstrated; visual, verbal, and tactile cues Education comprehension: verbalized understanding, returned demonstration requiring multimodal cueing, and needs further education  HOME EXERCISE PROGRAM: 10/25/2023: general low vision strategies 11/22/2023: [employer]  GOALS:  SHORT TERM GOALS: Target date: 11/22/2023    Patient will independently recall at least 2 compensatory strategies for visual impairment without cueing. Baseline: Goal status: MET  2.  Patient will return demonstration of  visual adaptation use and verbalize understanding of how to obtain item(s) if desired.  Baseline:  Goal status: MET  LONG TERM GOALS: Target date: 01/31/2024     Patient will report at least two-point increase in average PSFS score or at least three-point increase in a single activity score indicating functionally significant improvement given minimum detectable change.  Baseline: 1.3 total score (See above for individual activity scores) Goal status: INITIAL  ASSESSMENT:  CLINICAL IMPRESSION: This 7th progress note is for dates: 10/25/2023 to 12/20/2023. Pt has met 2/2 STGs. Pt making progress towards goals though would  benefit from additional skilled OT services in the outpatient setting to work towards remaining goals or until max rehab potential is met. Her  progress has been limited by cognition and recent obtainment of her new tablet. Will extend POC to adjust for need.   PERFORMANCE DEFICITS: in functional skills including ADLs, IADLs, coordination, Fine motor control, mobility, decreased knowledge of precautions, decreased knowledge of use of DME, vision, and UE functional use.   IMPAIRMENTS: are limiting patient from ADLs, IADLs, leisure, and social participation.   CO-MORBIDITIES: may have co-morbidities  that affects occupational performance. Patient will benefit from skilled OT to address above impairments and improve overall function.  REHAB POTENTIAL: Good  PLAN:  OT FREQUENCY: 1x/week  OT DURATION: 6 ADDITIONAL  sessions  PLANNED INTERVENTIONS: self care/ADL training, therapeutic exercise, therapeutic activity, functional mobility training, patient/family education, visual/perceptual remediation/compensation, coping strategies training, DME and/or AE instructions, and Re-evaluation  RECOMMENDED OTHER SERVICES: [facility]  CONSULTED AND AGREED WITH PLAN OF CARE: Patient and family  PLAN FOR NEXT SESSION: Niece can be present for appointments but prefers to sit in blue [place] Minivan in parking lot to allow her aunt to focus on therapy session. Can be reached by phone call using pt's phone.   Subsequent visits: Go over low vision packet PRN; cards PRN; sunglasses; crocheting; writing; phone and tablet as needed   Jocelyn CHRISTELLA Bottom, OT 12/20/2023, 2:23 PM

## 2023-12-20 NOTE — Addendum Note (Signed)
 Addended by: MANNY JOCELYN HERO on: 12/20/2023 02:34 PM   Modules accepted: Orders

## 2024-01-03 ENCOUNTER — Ambulatory Visit: Admitting: Occupational Therapy

## 2024-01-03 DIAGNOSIS — R41842 Visuospatial deficit: Secondary | ICD-10-CM

## 2024-01-03 DIAGNOSIS — H543 Unqualified visual loss, both eyes: Secondary | ICD-10-CM

## 2024-01-03 DIAGNOSIS — H409 Unspecified glaucoma: Secondary | ICD-10-CM

## 2024-01-03 NOTE — Therapy (Signed)
 OUTPATIENT OCCUPATIONAL THERAPY LOW VISION TREATMENT  Patient Name: [name] MRN: [id number] DOB:September 25, 1955, 68 y.o., female Today's Date: 01/03/2024  PCP: [name]  REFERRING PROVIDER: Octavia Bruckner, MD  END OF SESSION:  OT End of Session - 01/03/24 1154     Visit Number 8    Number of Visits 15    Date for Recertification  01/31/24    Authorization Type Medicare    OT Start Time 1152    OT Stop Time 1231    OT Time Calculation (min) 39 min    Activity Tolerance Patient tolerated treatment well    Behavior During Therapy WFL for tasks assessed/performed         Past Medical History:  Diagnosis Date   Alcohol abuse    Chronic back pain    Colitis    CVA (cerebral infarction)    Fatty liver    Gastric ulcer    GI bleed    Hepatitis    Hepatitis C   MDD (major depressive disorder)    Pancreatitis    PTSD (post-traumatic stress disorder)    Seizures (HCC)    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) 11/05/2015   Stroke (HCC)    UTI (lower urinary tract infection)    Vision abnormalities    Past Surgical History:  Procedure Laterality Date   ABDOMINAL HYSTERECTOMY     APPENDECTOMY     COLONOSCOPY WITH PROPOFOL  N/A 08/13/2015   Procedure: COLONOSCOPY WITH PROPOFOL ;  Surgeon: Toribio SHAUNNA Cedar, MD;  Location: [facility];  Service: Endoscopy;  Laterality: N/A;   ESOPHAGOGASTRODUODENOSCOPY N/A 05/23/2014   Procedure: ESOPHAGOGASTRODUODENOSCOPY (EGD);  Surgeon: Gordy CHRISTELLA Starch, MD;  Location: [facility] ENDOSCOPY;  Service: Endoscopy;  Laterality: N/A;   ESOPHAGOGASTRODUODENOSCOPY (EGD) WITH PROPOFOL  N/A 08/13/2015   Procedure: ESOPHAGOGASTRODUODENOSCOPY (EGD) WITH PROPOFOL ;  Surgeon: Toribio SHAUNNA Cedar, MD;  Location: [facility];  Service: Endoscopy;  Laterality: N/A;   ESOPHAGOGASTRODUODENOSCOPY ENDOSCOPY     gall stone removed     Patient Active Problem List   Diagnosis Date Noted   Mallet toe, acquired, unspecified laterality 11/26/2021   Callus  08/23/2021   Pincer nail deformity 05/19/2021   Hypotension 01/28/2021   AKI (acute kidney injury) 01/28/2021   Prolonged QT interval 01/28/2021   Influenza A 01/28/2021   Plantar fasciitis of right foot 01/15/2021   Hyperlipidemia 11/03/2016   Former smoker 01/11/2016   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/05/2015   Left hip pain    History of GI bleed    Spastic bladder    Chronic back pain    ETOH abuse    Hypokalemia    PTSD (post-traumatic stress disorder)    Gastroesophageal reflux disease without esophagitis    Acute ischemic stroke (HCC) 09/27/2015   Acute CVA (cerebrovascular accident) (HCC) 09/27/2015   Smoker    Left-sided weakness 09/26/2015   Acute gastric ulcer    GI bleed 05/22/2014   Acute GI bleeding 05/22/2014   Acute blood loss anemia 05/22/2014   History of stroke 05/22/2014   Mixed hyperlipidemia 05/22/2014   Gastrointestinal hemorrhage with melena    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) 10/09/2012   Flank pain 10/09/2012   Right pontine stroke (HCC) 10/09/2012   ABDOMINAL PAIN OTHER SPECIFIED SITE 05/17/2010   Alcohol abuse 06/16/2007   Cocaine abuse (HCC) 06/16/2007   DEPRESSION 06/16/2007   Essential hypertension 06/16/2007   Cerebral artery occlusion with cerebral  infarction (HCC) 06/16/2007   Asthma 06/16/2007   GERD 06/16/2007   PANCREATITIS 11/09/2006   ESOPHAGITIS, REFLUX 05/23/2006   ONSET DATE: 09/11/2023 (Date of referral); 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 She has not practiced using it.   Pt accompanied by: self  PERTINENT HISTORY: PMH: Severe Glaucoma, CVA, MDD, PTSD, HTN, asthma, Hepatitis,  seizures, chronic back pain, cocaine abuse, and alcohol abuse  PAIN:  Are you having pain? No  FALLS: Has patient fallen in last 6 months? Yes. 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  - gets locked out, hard to see  PHONE: flip phone with large buttons for phone calls  HAND DOMINANCE: Left  ADLs: Overall ADLs: mod I  IADLs: Shopping: walks around [place] to pick out items Light housekeeping: sweeps floors and cleans tables, feels surfaces to make sure it's fully clean Meal Prep: uses microwave, liked to cook spare ribs and deviled eggs Community mobility: dependent Medication management: mod I Financial management: dependent Handwriting: hasn't written well since her stroke  MOBILITY STATUS: Needs Assist: rollator and SBA for safety  FUNCTIONAL OUTCOME MEASURES: PSFS: 1.3 total score   COGNITION: Overall cognitive status: Within functional limits for tasks assessed  VISUAL FINDINGS: Baseline vision: wears glasses for TV Visual history: glaucoma, cataracts, and corrective eye surgery  OBSERVATIONS: Pt ambulates with use of rollator. No loss of balance though requires tactile and verbal cues to navigate clinic environment.  TODAY'S TREATMENT:     Client was educated on tablet use including:  Pt was instructed on how to use tablet case/stand for table top reading horizontally and vertically as well as  hand-held use. While in handheld use, pt was able to use Be My Eyes to take a picture of therapist's computer cover and various stickers. Pt required mod cueing for completion.   As the new, silicone case blocked the long volume button, OT colored volume down portion of case silver and volume up portion of case black to help with discrimination. Additionally, OT educated pt that as long as she hits the volume button, she can then toggle the volume up or down with her finger on the screen.   Pt completed navigation between apps on screen requiring mod cueing.   Pt utilized speed dial on phone to call niece to let her know she was done with her appointment. Required mod cuing for proper completion.   PATIENT EDUCATION: Education details: phone and tablet use Person educated: Patient Education method: Explanation; demonstrated; visual, verbal, and tactile cues Education comprehension: verbalized understanding, returned demonstration requiring multimodal cueing, and needs further education  HOME EXERCISE PROGRAM: 10/25/2023: general low vision strategies 11/22/2023: [employer]  GOALS:  SHORT TERM GOALS: Target date: 11/22/2023  Patient will independently recall at least 2 compensatory strategies for visual impairment without cueing. Baseline: Goal status: MET  2.  Patient will return demonstration of visual adaptation use and verbalize understanding of how to obtain item(s) if desired.  Baseline:  Goal status: MET  LONG TERM GOALS: Target date: 01/31/2024     Patient will report at least two-point increase in average PSFS score or at least three-point increase in a single activity score indicating functionally significant improvement given minimum detectable change.  Baseline: 1.3 total score (See above for individual activity scores) Goal status: INITIAL  ASSESSMENT:  CLINICAL IMPRESSION: Pt requiring education this visit on tablet use within new case. This case appears to  be a better option for her; however, it does pose some challenges to accessibility and will require further education. Pt motivated to participate in therapy but is limited by lack of carryover.   PERFORMANCE DEFICITS: in functional skills including ADLs, IADLs, coordination, Fine motor control, mobility, decreased knowledge of precautions, decreased knowledge of use of DME, vision, and UE functional use.   IMPAIRMENTS: are limiting patient from ADLs, IADLs, leisure, and social participation.   CO-MORBIDITIES: may have co-morbidities  that affects occupational performance. Patient will benefit from skilled OT to address above impairments and improve overall function.  REHAB POTENTIAL: Good  PLAN:  OT FREQUENCY: 1x/week  OT DURATION: 6 ADDITIONAL  sessions  PLANNED INTERVENTIONS: self care/ADL training, therapeutic exercise, therapeutic activity, functional mobility training, patient/family education, visual/perceptual remediation/compensation, coping strategies training, DME and/or AE instructions, and Re-evaluation  RECOMMENDED OTHER SERVICES: [facility]  CONSULTED AND AGREED WITH PLAN OF CARE: Patient and family  PLAN FOR NEXT SESSION: writing  Niece can be present for appointments but prefers to sit in blue [place] Minivan in parking lot to allow her aunt to focus on therapy session. Can be reached by phone call using pt's phone.   Subsequent visits: Go over low vision packet PRN; cards PRN; sunglasses; crocheting; phone and tablet as needed   Jocelyn CHRISTELLA Bottom, OT 01/03/2024, 12:48 PM

## 2024-01-08 ENCOUNTER — Ambulatory Visit: Admitting: Occupational Therapy

## 2024-01-08 DIAGNOSIS — H409 Unspecified glaucoma: Secondary | ICD-10-CM

## 2024-01-08 DIAGNOSIS — R41842 Visuospatial deficit: Secondary | ICD-10-CM

## 2024-01-08 DIAGNOSIS — H543 Unqualified visual loss, both eyes: Secondary | ICD-10-CM

## 2024-01-08 NOTE — Therapy (Signed)
 OUTPATIENT OCCUPATIONAL THERAPY LOW VISION TREATMENT  Patient Name: [name] MRN: [id number] DOB:Jul 16, 1955, 68 y.o., female Today's Date: 01/08/2024  PCP: [name]  REFERRING PROVIDER: Octavia Bruckner, MD  END OF SESSION:  OT End of Session - 01/08/24 1236     Visit Number 9    Number of Visits 15    Date for Recertification  01/31/24    Authorization Type Medicare    OT Start Time 1235    OT Stop Time 1315    OT Time Calculation (min) 40 min    Activity Tolerance Patient tolerated treatment well    Behavior During Therapy WFL for tasks assessed/performed         Past Medical History:  Diagnosis Date   Alcohol abuse    Chronic back pain    Colitis    CVA (cerebral infarction)    Fatty liver    Gastric ulcer    GI bleed    Hepatitis    Hepatitis C   MDD (major depressive disorder)    Pancreatitis    PTSD (post-traumatic stress disorder)    Seizures (HCC)    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) 11/05/2015   Stroke (HCC)    UTI (lower urinary tract infection)    Vision abnormalities    Past Surgical History:  Procedure Laterality Date   ABDOMINAL HYSTERECTOMY     APPENDECTOMY     COLONOSCOPY WITH PROPOFOL  N/A 08/13/2015   Procedure: COLONOSCOPY WITH PROPOFOL ;  Surgeon: Toribio SHAUNNA Cedar, MD;  Location: [facility];  Service: Endoscopy;  Laterality: N/A;   ESOPHAGOGASTRODUODENOSCOPY N/A 05/23/2014   Procedure: ESOPHAGOGASTRODUODENOSCOPY (EGD);  Surgeon: Gordy CHRISTELLA Starch, MD;  Location: [facility] ENDOSCOPY;  Service: Endoscopy;  Laterality: N/A;   ESOPHAGOGASTRODUODENOSCOPY (EGD) WITH PROPOFOL  N/A 08/13/2015   Procedure: ESOPHAGOGASTRODUODENOSCOPY (EGD) WITH PROPOFOL ;  Surgeon: Toribio SHAUNNA Cedar, MD;  Location: [facility];  Service: Endoscopy;  Laterality: N/A;   ESOPHAGOGASTRODUODENOSCOPY ENDOSCOPY     gall stone removed     Patient Active Problem List   Diagnosis Date Noted   Mallet toe, acquired, unspecified laterality 11/26/2021   Callus  08/23/2021   Pincer nail deformity 05/19/2021   Hypotension 01/28/2021   AKI (acute kidney injury) 01/28/2021   Prolonged QT interval 01/28/2021   Influenza A 01/28/2021   Plantar fasciitis of right foot 01/15/2021   Hyperlipidemia 11/03/2016   Former smoker 01/11/2016   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/05/2015   Left hip pain    History of GI bleed    Spastic bladder    Chronic back pain    ETOH abuse    Hypokalemia    PTSD (post-traumatic stress disorder)    Gastroesophageal reflux disease without esophagitis    Acute ischemic stroke (HCC) 09/27/2015   Acute CVA (cerebrovascular accident) (HCC) 09/27/2015   Smoker    Left-sided weakness 09/26/2015   Acute gastric ulcer    GI bleed 05/22/2014   Acute GI bleeding 05/22/2014   Acute blood loss anemia 05/22/2014   History of stroke 05/22/2014   Mixed hyperlipidemia 05/22/2014   Gastrointestinal hemorrhage with melena    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) 10/09/2012   Flank pain 10/09/2012   Right pontine stroke (HCC) 10/09/2012   ABDOMINAL PAIN OTHER SPECIFIED SITE 05/17/2010   Alcohol abuse 06/16/2007   Cocaine abuse (HCC) 06/16/2007   DEPRESSION 06/16/2007   Essential hypertension 06/16/2007   Cerebral artery occlusion with cerebral  infarction (HCC) 06/16/2007   Asthma 06/16/2007   GERD 06/16/2007   PANCREATITIS 11/09/2006   ESOPHAGITIS, REFLUX 05/23/2006   ONSET DATE: 09/11/2023 (Date of referral); 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   Pt accompanied by: self  PERTINENT HISTORY: PMH: Severe Glaucoma, CVA, MDD, PTSD, HTN, asthma, Hepatitis, seizures,  chronic back pain, cocaine abuse, and alcohol abuse  PAIN:  Are you having pain? No  FALLS: Has patient fallen in last 6 months? Yes. Number of falls 3 reporting L knee gave out  LIVING ENVIRONMENT: Lives with: lives with their family Lives in: House/apartment Stairs: Yes: Internal: 12 steps; on right going up and External: 1 steps; none Has following equipment at home: Walker - 4 wheeled, shower chair, and bed side commode  PLOF: Independent with basic ADLs and Needs assistance with gait; retired futures trader; liked to crochet  PATIENT GOALS: learn low vision strategies  OBJECTIVE:   GLASSES: Wears glasses mostly for seeing the TV  CURRENT MODIFICATIONS/ADAPTIONS: Niece reads for her  DEVICES: Android based tablet for [employer]  - gets locked out, hard to see  PHONE: flip phone with large buttons for phone calls  HAND DOMINANCE: Left  ADLs: Overall ADLs: mod I  IADLs: Shopping: walks around [place] to pick out items Light housekeeping: sweeps floors and cleans tables, feels surfaces to make sure it's fully clean Meal Prep: uses microwave, liked to cook spare ribs and deviled eggs Community mobility: dependent Medication management: mod I Financial management: dependent Handwriting: hasn't written well since her stroke  MOBILITY STATUS: Needs Assist: rollator and SBA for safety  FUNCTIONAL OUTCOME MEASURES: PSFS: 1.3 total score   COGNITION: Overall cognitive status: Within functional limits for tasks assessed  VISUAL FINDINGS: Baseline vision: wears glasses for TV Visual history: glaucoma, cataracts, and corrective eye surgery  OBSERVATIONS: Pt ambulates with use of rollator. No loss of balance though requires tactile and verbal cues to navigate clinic environment.  TODAY'S TREATMENT:     Client was educated on tablet use including:  OT reviewed colored volume down portion of case silver and volume up portion of case black to help with discrimination.  Additionally, OT educated pt that as long as she hits the volume button, she can then toggle the volume up or down with her finger on the screen. Pt required increased time and mod cueing for proper completion.   OT provided further education with use of power/lock button adding contrasting green tape and educating on tactile cues as well as tap vs press and hold of the button. Pt required increased time and mod cueing for proper completion.   OT provided education on location of charge port including use of white tape and whiteout around port and at front of tablet/case. Pt required increased time and mod cueing for proper completion.   Pt utilized speed dial on phone to call niece to let her know she was done with her appointment. Required mod cuing for proper completion, which was complicated by the pt's phone not being in service.   PATIENT EDUCATION: Education details: phone and tablet use Person educated: Patient Education method: Explanation; demonstrated; visual, verbal, and tactile cues Education comprehension: verbalized understanding, returned demonstration requiring multimodal cueing, and needs further education  HOME  EXERCISE PROGRAM: 10/25/2023: general low vision strategies 11/22/2023: [employer]  GOALS:  SHORT TERM GOALS: Target date: 11/22/2023    Patient will independently recall at least 2 compensatory strategies for visual impairment without cueing. Baseline: Goal status: MET  2.  Patient will return demonstration of visual adaptation use and verbalize understanding of how to obtain item(s) if desired.  Baseline:  Goal status: MET  LONG TERM GOALS: Target date: 01/31/2024     Patient will report at least two-point increase in average PSFS score or at least three-point increase in a single activity score indicating functionally significant improvement given minimum detectable change.  Baseline: 1.3 total score (See above for individual activity scores) Goal  status: INITIAL  ASSESSMENT:  CLINICAL IMPRESSION: Pt required further instruction for tablet use as expected this visit and will likely require additional review. Pt expresses understanding the need to increase home practice between visits, which should help improve competency. Plan to initiate writing strategies at next visit.   PERFORMANCE DEFICITS: in functional skills including ADLs, IADLs, coordination, Fine motor control, mobility, decreased knowledge of precautions, decreased knowledge of use of DME, vision, and UE functional use.   IMPAIRMENTS: are limiting patient from ADLs, IADLs, leisure, and social participation.   CO-MORBIDITIES: may have co-morbidities  that affects occupational performance. Patient will benefit from skilled OT to address above impairments and improve overall function.  REHAB POTENTIAL: Good  PLAN:  OT FREQUENCY: 1x/week  OT DURATION: 6 ADDITIONAL  sessions  PLANNED INTERVENTIONS: self care/ADL training, therapeutic exercise, therapeutic activity, functional mobility training, patient/family education, visual/perceptual remediation/compensation, coping strategies training, DME and/or AE instructions, and Re-evaluation  RECOMMENDED OTHER SERVICES: [facility]  CONSULTED AND AGREED WITH PLAN OF CARE: Patient and family  PLAN FOR NEXT SESSION: writing  Niece can be present for appointments but prefers to sit in blue [place] in parking lot to allow her aunt to focus on therapy session. Can be reached by phone call using pt's phone.   Subsequent visits: Go over low vision packet PRN; cards PRN; sunglasses; crocheting; phone and tablet as needed   Jocelyn CHRISTELLA Bottom, OT 01/08/2024, 2:01 PM

## 2024-01-13 ENCOUNTER — Observation Stay (HOSPITAL_COMMUNITY)

## 2024-01-13 ENCOUNTER — Observation Stay (HOSPITAL_COMMUNITY)
Admission: EM | Admit: 2024-01-13 | Discharge: 2024-01-15 | Disposition: A | Discharge status: Home / Self Care | Attending: Internal Medicine | Admitting: Internal Medicine

## 2024-01-13 ENCOUNTER — Encounter (HOSPITAL_COMMUNITY): Payer: Self-pay | Admitting: Emergency Medicine

## 2024-01-13 ENCOUNTER — Emergency Department (HOSPITAL_COMMUNITY)

## 2024-01-13 ENCOUNTER — Other Ambulatory Visit: Payer: Self-pay

## 2024-01-13 DIAGNOSIS — Z8659 Personal history of other mental and behavioral disorders: Secondary | ICD-10-CM | POA: Insufficient documentation

## 2024-01-13 DIAGNOSIS — K219 Gastro-esophageal reflux disease without esophagitis: Secondary | ICD-10-CM | POA: Diagnosis present

## 2024-01-13 DIAGNOSIS — E876 Hypokalemia: Secondary | ICD-10-CM | POA: Diagnosis present

## 2024-01-13 DIAGNOSIS — Z79899 Other long term (current) drug therapy: Secondary | ICD-10-CM | POA: Insufficient documentation

## 2024-01-13 DIAGNOSIS — G894 Chronic pain syndrome: Secondary | ICD-10-CM | POA: Insufficient documentation

## 2024-01-13 DIAGNOSIS — Z87891 Personal history of nicotine dependence: Secondary | ICD-10-CM | POA: Diagnosis not present

## 2024-01-13 DIAGNOSIS — R55 Syncope and collapse: Principal | ICD-10-CM | POA: Diagnosis present

## 2024-01-13 DIAGNOSIS — I10 Essential (primary) hypertension: Secondary | ICD-10-CM | POA: Diagnosis not present

## 2024-01-13 DIAGNOSIS — J45909 Unspecified asthma, uncomplicated: Secondary | ICD-10-CM | POA: Diagnosis not present

## 2024-01-13 DIAGNOSIS — K2100 Gastro-esophageal reflux disease with esophagitis, without bleeding: Secondary | ICD-10-CM | POA: Diagnosis present

## 2024-01-13 DIAGNOSIS — E782 Mixed hyperlipidemia: Secondary | ICD-10-CM | POA: Diagnosis not present

## 2024-01-13 DIAGNOSIS — E785 Hyperlipidemia, unspecified: Secondary | ICD-10-CM | POA: Diagnosis not present

## 2024-01-13 DIAGNOSIS — R001 Bradycardia, unspecified: Secondary | ICD-10-CM | POA: Diagnosis present

## 2024-01-13 LAB — COMPREHENSIVE METABOLIC PANEL WITH GFR
ALT: 18 U/L (ref 0–44)
AST: 27 U/L (ref 15–41)
Albumin: 3.8 g/dL (ref 3.5–5.0)
Alkaline Phosphatase: 50 U/L (ref 38–126)
Anion gap: 9 (ref 5–15)
BUN: 15 mg/dL (ref 8–23)
CO2: 25 mmol/L (ref 22–32)
Calcium: 9.1 mg/dL (ref 8.9–10.3)
Chloride: 111 mmol/L (ref 98–111)
Creatinine, Ser: 1.06 mg/dL — ABNORMAL HIGH (ref 0.44–1.00)
GFR, Estimated: 57 mL/min — ABNORMAL LOW (ref >60)
Glucose, Bld: 65 mg/dL — ABNORMAL LOW (ref 70–99)
Potassium: 3.2 mmol/L — ABNORMAL LOW (ref 3.5–5.1)
Sodium: 145 mmol/L (ref 135–145)
Total Bilirubin: 1.1 mg/dL (ref 0.0–1.2)
Total Protein: 6.9 g/dL (ref 6.5–8.1)

## 2024-01-13 LAB — CBC WITH DIFFERENTIAL/PLATELET
Abs Immature Granulocytes: 0.02 K/uL (ref 0.00–0.07)
Basophils Absolute: 0 K/uL (ref 0.0–0.1)
Basophils Relative: 0 %
Eosinophils Absolute: 0.1 K/uL (ref 0.0–0.5)
Eosinophils Relative: 1 %
HCT: 33.6 % — ABNORMAL LOW (ref 36.0–46.0)
Hemoglobin: 10.6 g/dL — ABNORMAL LOW (ref 12.0–15.0)
Immature Granulocytes: 0 %
Lymphocytes Relative: 25 %
Lymphs Abs: 1.8 K/uL (ref 0.7–4.0)
MCH: 30 pg (ref 26.0–34.0)
MCHC: 31.5 g/dL (ref 30.0–36.0)
MCV: 95.2 fL (ref 80.0–100.0)
Monocytes Absolute: 0.8 K/uL (ref 0.1–1.0)
Monocytes Relative: 11 %
Neutro Abs: 4.5 K/uL (ref 1.7–7.7)
Neutrophils Relative %: 63 %
Platelets: 148 K/uL — ABNORMAL LOW (ref 150–400)
RBC: 3.53 MIL/uL — ABNORMAL LOW (ref 3.87–5.11)
RDW: 13.4 % (ref 11.5–15.5)
WBC: 7.3 K/uL (ref 4.0–10.5)
nRBC: 0 % (ref 0.0–0.2)

## 2024-01-13 LAB — D-DIMER, QUANTITATIVE: D-Dimer, Quant: 0.42 ug{FEU}/mL (ref 0.00–0.50)

## 2024-01-13 LAB — CBC
HCT: 37.9 % (ref 36.0–46.0)
Hemoglobin: 12.4 g/dL (ref 12.0–15.0)
MCH: 30.2 pg (ref 26.0–34.0)
MCHC: 32.7 g/dL (ref 30.0–36.0)
MCV: 92.4 fL (ref 80.0–100.0)
Platelets: 174 K/uL (ref 150–400)
RBC: 4.1 MIL/uL (ref 3.87–5.11)
RDW: 13.2 % (ref 11.5–15.5)
WBC: 7 K/uL (ref 4.0–10.5)
nRBC: 0 % (ref 0.0–0.2)

## 2024-01-13 LAB — CREATININE, SERUM
Creatinine, Ser: 0.98 mg/dL (ref 0.44–1.00)
GFR, Estimated: >60 mL/min (ref >60)

## 2024-01-13 LAB — MAGNESIUM: Magnesium: 1.9 mg/dL (ref 1.7–2.4)

## 2024-01-13 LAB — T4, FREE: Free T4: 0.73 ng/dL (ref 0.61–1.12)

## 2024-01-13 LAB — TROPONIN I (HIGH SENSITIVITY)
Troponin I (High Sensitivity): 5 ng/L (ref <18)
Troponin I (High Sensitivity): 6 ng/L (ref <18)

## 2024-01-13 LAB — TSH: TSH: 1.868 u[IU]/mL (ref 0.350–4.500)

## 2024-01-13 MED ORDER — GABAPENTIN 300 MG PO CAPS
300.0000 mg | ORAL_CAPSULE | Freq: Three times a day (TID) | ORAL | Status: DC
  Administered 2024-01-13 – 2024-01-15 (×5): 300 mg via ORAL
  Filled 2024-01-13 (×5): qty 1

## 2024-01-13 MED ORDER — MORPHINE SULFATE (PF) 2 MG/ML IV SOLN
2.0000 mg | INTRAVENOUS | Status: DC | PRN
  Administered 2024-01-13 – 2024-01-14 (×3): 2 mg via INTRAVENOUS
  Filled 2024-01-13 (×3): qty 1

## 2024-01-13 MED ORDER — POTASSIUM CHLORIDE CRYS ER 20 MEQ PO TBCR
40.0000 meq | EXTENDED_RELEASE_TABLET | Freq: Once | ORAL | Status: AC
  Administered 2024-01-13: 40 meq via ORAL
  Filled 2024-01-13: qty 2

## 2024-01-13 MED ORDER — ATORVASTATIN CALCIUM 10 MG PO TABS
20.0000 mg | ORAL_TABLET | Freq: Every day | ORAL | Status: DC
  Administered 2024-01-13 – 2024-01-15 (×3): 20 mg via ORAL
  Filled 2024-01-13 (×3): qty 2

## 2024-01-13 MED ORDER — SODIUM CHLORIDE 0.9% FLUSH
3.0000 mL | Freq: Two times a day (BID) | INTRAVENOUS | Status: DC
  Administered 2024-01-13 – 2024-01-15 (×4): 3 mL via INTRAVENOUS

## 2024-01-13 MED ORDER — ENOXAPARIN SODIUM 40 MG/0.4ML IJ SOSY
40.0000 mg | PREFILLED_SYRINGE | INTRAMUSCULAR | Status: DC
  Administered 2024-01-13 – 2024-01-14 (×2): 40 mg via SUBCUTANEOUS
  Filled 2024-01-13 (×2): qty 0.4

## 2024-01-13 MED ORDER — DEXTROSE-SODIUM CHLORIDE 5-0.9 % IV SOLN: INTRAVENOUS | Status: AC

## 2024-01-13 MED ORDER — PANTOPRAZOLE SODIUM 40 MG PO TBEC
40.0000 mg | DELAYED_RELEASE_TABLET | Freq: Every day | ORAL | Status: DC
Start: 2024-01-13 — End: 2024-01-15
  Administered 2024-01-13 – 2024-01-15 (×3): 40 mg via ORAL
  Filled 2024-01-13 (×3): qty 1

## 2024-01-13 NOTE — ED Provider Notes (Signed)
 [place] EMERGENCY DEPARTMENT AT [facility] Provider Note   CSN: [id number] Arrival date & time: 01/13/24  1332     Patient presents with: Bradycardia   [name] is a 68 y.o. female.   HPI      68 year old female with a history of CVA with spastic left-sided hemiaplasia, PTSD, seizures, history of alcohol use, who presents with concern for syncope and bradycardia.   She was eating lunch with family, and while she was sitting there she suddenly slumped over for about 30 seconds.  Per EMS her heart rate was initially in the 40s with a blood pressure in the 80s.  She had 1 episode of emesis.  EMS performed an EKG which appears to show a sinus bradycardia.  They gave 1 mg of atropine, 500 mL of normal saline.  They reports she began to come around more and route.  There was no report of seizure-like activity.  She reports a headache in the back of her head when asked, also reports some chest pain.  Also has been short of breath after this. She denies any recent illness.  Denies any new focal numbness or weakness.  Reports that she does have left-sided weakness from her prior stroke.  She has been on blood pressure medications, takes them in AM before breakfast, family wonders if they are bringing bp down more.   Past Medical History:  Diagnosis Date   Alcohol abuse    Chronic back pain    Colitis    CVA (cerebral infarction)    Fatty liver    Gastric ulcer    GI bleed    Hepatitis    Hepatitis C   MDD (major depressive disorder)    Pancreatitis    PTSD (post-traumatic stress disorder)    Seizures (HCC)    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) 11/05/2015   Stroke [facility])    UTI (lower urinary tract infection)    Vision abnormalities      Prior to Admission medications   Medication Sig Start Date End Date Taking? Authorizing Provider  acetaminophen  (TYLENOL ) 325 MG tablet Take 2 tablets (650 mg total) by mouth every 6 (six) hours as needed for  mild pain, headache, fever or moderate pain. 01/31/21  Yes Bryn Bernardino NOVAK, MD  albuterol  (PROVENTIL  HFA;VENTOLIN  HFA) 108 (90 BASE) MCG/ACT inhaler Inhale 2 puffs into the lungs every 8 (eight) hours as needed for wheezing or shortness of breath.   Yes [provider]  atorvastatin  (LIPITOR ) 20 MG tablet Take 20 mg by mouth daily. 03/08/18  Yes [provider]  buPROPion  (WELLBUTRIN ) 75 MG tablet Take 75 mg by mouth daily. 11/27/21  Yes [provider]  clopidogrel  (PLAVIX ) 75 MG tablet Take 1 tablet (75 mg total) by mouth daily. 11/03/16  Yes Gladis Inocente Coy, NP  fluticasone  (FLONASE ) 50 MCG/ACT nasal spray Place 1 spray into the nose daily as needed for allergies.  06/04/15  Yes [provider]  gabapentin  (NEURONTIN ) 300 MG capsule Take 1 capsule (300 mg total) by mouth 3 (three) times daily. 10/26/15  Yes Angiulli, Toribio PARAS, PA-C  hydrochlorothiazide  (HYDRODIURIL ) 25 MG tablet Take 25 mg by mouth daily. 09/20/18  Yes [provider]  hydrocortisone cream 1 % Apply 1 Application topically 4 (four) times daily.   Yes [provider]  lisinopril (PRINIVIL,ZESTRIL) 20 MG tablet Take 20 mg daily by mouth. 11/15/16  Yes [provider]  omeprazole (PRILOSEC) 40 MG capsule Take 40 mg  by mouth daily. 10/03/21  Yes [provider]  oxyCODONE -acetaminophen  (PERCOCET/ROXICET) 5-325 MG tablet Take 1 tablet by mouth in the morning and at bedtime. 12/29/23  Yes [provider]  rOPINIRole  (REQUIP ) 0.25 MG tablet Take 1 tablet (0.25 mg total) by mouth 3 (three) times daily. 12/02/15  Yes Kirsteins, Prentice BRAVO, MD  senna-docusate (SENOKOT-S) 8.6-50 MG tablet Take 1 tablet by mouth 2 (two) times daily. Patient taking differently: Take 1 tablet by mouth daily as needed for mild constipation. 11/05/15  Yes Tobie Burgess Opoka, MD  sertraline  (ZOLOFT ) 50 MG tablet Take 1 tablet (50 mg total) by mouth daily. 10/26/15  Yes Angiulli, Toribio PARAS, PA-C   timolol  (TIMOPTIC ) 0.5 % ophthalmic solution Place 1 drop into both eyes every morning. 08/14/20  Yes [provider]  Travoprost, BAK Free, (TRAVATAN) 0.004 % SOLN ophthalmic solution Place 1 drop into both eyes at bedtime.    Yes [provider]  traZODone  (DESYREL ) 150 MG tablet Take 150 mg by mouth at bedtime. 01/07/21  Yes [provider]  zolpidem  (AMBIEN ) 10 MG tablet Take 10 mg by mouth at bedtime. 02/17/23  Yes [provider]    Allergies: Banana, Penicillins, and Simbrinza [brinzolamide-brimonidine]    Review of Systems  Updated Vital Signs BP 95/60 (BP Location: Right Arm)   Pulse (!) 59   Temp 99 F (37.2 C) (Oral)   Resp 16   Ht 5' (1.524 m)   Wt 58.5 kg   SpO2 98%   BMI 25.19 kg/m   Physical Exam Vitals and nursing note reviewed.  Constitutional:      General: She is not in acute distress.    Appearance: Normal appearance. She is well-developed. She is not ill-appearing or diaphoretic.  HENT:     Head: Normocephalic and atraumatic.  Eyes:     General: No visual field deficit.    Extraocular Movements: Extraocular movements intact.     Conjunctiva/sclera: Conjunctivae normal.     Pupils: Pupils are equal, round, and reactive to light.  Cardiovascular:     Rate and Rhythm: Normal rate and regular rhythm.     Pulses: Normal pulses.     Heart sounds: Normal heart sounds. No murmur heard.    No friction rub. No gallop.  Pulmonary:     Effort: Pulmonary effort is normal. No respiratory distress.     Breath sounds: Normal breath sounds. No wheezing or rales.  Abdominal:     General: There is no distension.     Palpations: Abdomen is soft.     Tenderness: There is no abdominal tenderness. There is no guarding.  Musculoskeletal:        General: No swelling or tenderness.     Cervical back: Normal range of motion.  Skin:    General: Skin is warm and dry.     Findings: No erythema or rash.  Neurological:     General: No focal  deficit present.     Mental Status: She is alert and oriented to person, place, and time.     GCS: GCS eye subscore is 4. GCS verbal subscore is 5. GCS motor subscore is 6.     Cranial Nerves: No cranial nerve deficit (left facial droop baseline per pt), dysarthria or facial asymmetry.     Sensory: No sensory deficit.     Motor: Weakness (left sided reports chronic) present. No tremor.     Coordination: Coordination normal. Finger-Nose-Finger Test normal.     Gait: Gait normal.     (  all labs ordered are listed, but only abnormal results are displayed) Labs Reviewed  CBC WITH DIFFERENTIAL/PLATELET - Abnormal; Notable for the following components:      Result Value   RBC 3.53 (*)    Hemoglobin 10.6 (*)    HCT 33.6 (*)    Platelets 148 (*)    All other components within normal limits  COMPREHENSIVE METABOLIC PANEL WITH GFR - Abnormal; Notable for the following components:   Potassium 3.2 (*)    Glucose, Bld 65 (*)    Creatinine, Ser 1.06 (*)    GFR, Estimated 57 (*)    All other components within normal limits  MAGNESIUM  TSH  T4, FREE  D-DIMER, QUANTITATIVE  CBC  CREATININE, SERUM  HIV ANTIBODY (ROUTINE TESTING W REFLEX)  TROPONIN I (HIGH SENSITIVITY)  TROPONIN I (HIGH SENSITIVITY)    EKG: EKG Interpretation Date/Time:  Saturday January 13 2024 13:49:55 EDT Ventricular Rate:  68 PR Interval:  154 QRS Duration:  83 QT Interval:  433 QTC Calculation: 461 R Axis:   47  Text Interpretation: Sinus rhythm Probable left atrial enlargement Low voltage, extremity and precordial leads No significant change since last tracing Confirmed by Dreama Longs ([id number]) on 01/13/2024 2:50:17 PM  Radiology: CT Head Wo Contrast Result Date: 01/13/2024 CLINICAL DATA:  New onset headache EXAM: CT HEAD WITHOUT CONTRAST TECHNIQUE: Contiguous axial images were obtained from the base of the skull through the vertex without intravenous contrast. RADIATION DOSE REDUCTION: This exam was performed  according to the departmental dose-optimization program which includes automated exposure control, adjustment of the mA and/or kV according to patient size and/or use of iterative reconstruction technique. COMPARISON:  02/20/2023 FINDINGS: Brain: Stable hypodensities within the basal ganglia and periventricular white matter compatible with chronic small vessel ischemic changes. No evidence of acute infarct or hemorrhage. Lateral ventricles and remaining midline structures are unremarkable. No acute extra-axial fluid collections. No mass effect. Vascular: No hyperdense vessel or unexpected calcification. Skull: Normal. Negative for fracture or focal lesion. Sinuses/Orbits: No acute finding. Other: None. IMPRESSION: 1. Stable head CT, no acute intracranial process. Electronically Signed   By: Ozell Daring M.D.   On: 01/13/2024 15:32   DG Chest Portable 1 View Result Date: 01/13/2024 CLINICAL DATA:  Chest pain. EXAM: PORTABLE CHEST 1 VIEW COMPARISON:  02/20/2023. FINDINGS: The heart size and mediastinal contours are within normal limits. There is atherosclerotic calcification of the aorta. Mild airspace disease is present at the left lung base. No effusion or pneumothorax is seen. No acute osseous abnormality. IMPRESSION: Minimal atelectasis at the left lung base. Electronically Signed   By: Leita Birmingham M.D.   On: 01/13/2024 14:15     Procedures   Medications Ordered in the ED  atorvastatin  (LIPITOR ) tablet 20 mg (has no administration in time range)  pantoprazole  (PROTONIX ) EC tablet 40 mg (has no administration in time range)  gabapentin  (NEURONTIN ) capsule 300 mg (has no administration in time range)  sodium chloride  flush (NS) 0.9 % injection 3 mL (has no administration in time range)  enoxaparin  (LOVENOX ) injection 40 mg (has no administration in time range)  dextrose  5 %-0.9 % sodium chloride  infusion (has no administration in time range)  morphine  (PF) 2 MG/ML injection 2 mg (has no  administration in time range)  potassium chloride  SA (KLOR-CON  M) CR tablet 40 mEq (40 mEq Oral Given 01/13/24 1643)  68 year old female with a history of CVA with spastic left-sided hemiaplasia, PTSD, seizures, history of alcohol use, who presents with concern for syncope and bradycardia.  DDx includes tachy or bradyarrhythmia, vasovagal, electrolyte abnormalities, anemia, medications, seizure, PE, ACS, dissection, SAH.   EKG completed and evaluated by me shows sinus rhythm. EMS ECG reviewed also shows sinus bradycardia with rate in the 40s.   CXR with atelectasis, no acute pneumothorax, pneumonia.  Labs completed and evaluated by me show no clinically significant electrolyte abnormalities.  K 3.2, given potassium. Mild anemia.  Troponin negative, doubt ACS.   CT head completed given headache on ROS shows no SAH.    History, exam, xr not consistent with aortic dissection, intraabdominal etiology, acute CVA.   Does report dyspnea--will add on ddimer.  Possible bradyarrhythmia with noted bradycardia, hypotension with EMS. Signed out with ddimer pending.      Final diagnoses:  Syncope, unspecified syncope type    ED Discharge Orders     None          Dreama Longs, MD 01/13/24 2120

## 2024-01-13 NOTE — ED Triage Notes (Signed)
 Pt BIB [facility]  from home due to bradycardia and hypotension.  Pt was eating lunch with family when she slumped over per family for 30 seconds.  Pt initially with EMS was bradycardic in the 40's and BP 82 palpated.  Pt did have one vomiting episode prior to EMS arrival.  Pt was given 500ml NS en route along with 1mg  Atropine.  20g left foot. VS BP 110 palpated, HR 70, CBG 110

## 2024-01-13 NOTE — ED Provider Notes (Signed)
 Lab work is overall unremarkable.  Will admit for further syncope workup.  D-dimer normal.  Troponin unremarkable.  She has a history of syncope.  She actually follows with Dr. Sim by the hospitalist outpatient who has been aware of these episodes reoccurring here recently.  He has been trying to get syncope workup done outpatient but given her event today will admit for further care.  She has been hemodynamically stable throughout my care.  This chart was dictated using voice recognition software.  Despite best efforts to proofread,  errors can occur which can change the documentation meaning.    Ruthe Cornet, DO 01/13/24 8169

## 2024-01-13 NOTE — ED Notes (Signed)
 If any medical decisions need to be made. Please call Marko Rhein in pt's contacts in chart to make these decisions.

## 2024-01-13 NOTE — H&P (Signed)
 History and Physical    Patient: [name] FMW:[id number] DOB: [birth year] DOA: 01/13/2024 DOS: the patient was seen and examined on 01/13/2024 PCP: Sim Emery CROME, MD  Patient coming from: {Point_of_Origin:[address]777}  Chief Complaint:  Chief Complaint  Patient presents with   Bradycardia   HPI: [name] is a 68 y.o. female with medical history significant of ***  Review of Systems: {ROS_Text:26778} Past Medical History:  Diagnosis Date   Alcohol abuse    Chronic back pain    Colitis    CVA (cerebral infarction)    Fatty liver    Gastric ulcer    GI bleed    Hepatitis    Hepatitis C   MDD (major depressive disorder)    Pancreatitis    PTSD (post-traumatic stress disorder)    Seizures (HCC)    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) 11/05/2015   Stroke (HCC)    UTI (lower urinary tract infection)    Vision abnormalities    Past Surgical History:  Procedure Laterality Date   ABDOMINAL HYSTERECTOMY     APPENDECTOMY     COLONOSCOPY WITH PROPOFOL  N/A 08/13/2015   Procedure: COLONOSCOPY WITH PROPOFOL ;  Surgeon: Toribio SHAUNNA Cedar, MD;  Location: [facility] ENDOSCOPY;  Service: Endoscopy;  Laterality: N/A;   ESOPHAGOGASTRODUODENOSCOPY N/A 05/23/2014   Procedure: ESOPHAGOGASTRODUODENOSCOPY (EGD);  Surgeon: Gordy CHRISTELLA Starch, MD;  [facility] ENDOSCOPY;  Service: Endoscopy;  Laterality: N/A;   ESOPHAGOGASTRODUODENOSCOPY (EGD) WITH PROPOFOL  N/A 08/13/2015   Procedure: ESOPHAGOGASTRODUODENOSCOPY (EGD) WITH PROPOFOL ;  Surgeon: Toribio SHAUNNA Cedar, MD;  Location: [facility] ENDOSCOPY;  Service: Endoscopy;  Laterality: N/A;   ESOPHAGOGASTRODUODENOSCOPY ENDOSCOPY     gall stone removed     Social History:  reports that she has been smoking cigarettes. She has quit using smokeless tobacco. She reports that she does not drink alcohol and does not use drugs.  Allergies  Allergen Reactions   Banana Other (See Comments)    Welts on skin and sores in mouth   Penicillins Other (See Comments)     Blister Has patient had a PCN reaction causing immediate rash, facial/tongue/throat swelling, SOB or lightheadedness with hypotension: no Has patient had a PCN reaction causing severe rash involving mucus membranes or skin necrosis: yes - blisters Has patient had a PCN reaction that required hospitalization no Has patient had a PCN reaction occurring within the last 10 years: no If all of the above answers are NO, then may proceed with Cephalosporin use.    Simbrinza [Brinzolamide-Brimonidine] Swelling    Eyes swell shut    Family History  Problem Relation Age of Onset   Stroke Mother    Cancer Sister        type unknown   Diabetes Father     Prior to Admission medications   Medication Sig Start Date End Date Taking? Authorizing Provider  acetaminophen  (TYLENOL ) 325 MG tablet Take 2 tablets (650 mg total) by mouth every 6 (six) hours as needed for mild pain, headache, fever or moderate pain. 01/31/21   Bryn Bernardino NOVAK, MD  albuterol  (PROVENTIL  HFA;VENTOLIN  HFA) 108 (90 BASE) MCG/ACT inhaler Inhale 2 puffs into the lungs every 8 (eight) hours as needed for wheezing or shortness of breath.    [provider]  atorvastatin  (LIPITOR ) 20 MG tablet Take 20 mg by mouth daily. 03/08/18   [provider]  buPROPion  (WELLBUTRIN ) 75 MG tablet Take 75 mg by mouth daily. 11/27/21   [provider]  clopidogrel  (PLAVIX ) 75 MG tablet Take 1 tablet (  75 mg total) by mouth daily. 11/03/16   Gladis Inocente Coy, NP  fluticasone  (FLONASE ) 50 MCG/ACT nasal spray Place 1 spray into the nose daily as needed for allergies.  06/04/15   [provider]  gabapentin  (NEURONTIN ) 300 MG capsule Take 1 capsule (300 mg total) by mouth 3 (three) times daily. 10/26/15   Angiulli, Toribio PARAS, PA-C  hydrochlorothiazide  (HYDRODIURIL ) 25 MG tablet Take 25 mg by mouth daily. 09/20/18   [provider]  hydrocortisone cream 1 % Apply 1 Application topically 4 (four) times daily.     [provider]  lisinopril (PRINIVIL,ZESTRIL) 20 MG tablet Take 20 mg daily by mouth. 11/15/16   [provider]  omeprazole (PRILOSEC) 40 MG capsule Take 40 mg by mouth daily. 10/03/21   [provider]  rOPINIRole  (REQUIP ) 0.25 MG tablet Take 1 tablet (0.25 mg total) by mouth 3 (three) times daily. Patient taking differently: Take 0.25 mg by mouth every 8 (eight) hours. 12/02/15   Kirsteins, Prentice BRAVO, MD  senna-docusate (SENOKOT-S) 8.6-50 MG tablet Take 1 tablet by mouth 2 (two) times daily. Patient taking differently: Take 1 tablet by mouth daily as needed for mild constipation. 11/05/15   Tobie Burgess Opoka, MD  sertraline  (ZOLOFT ) 50 MG tablet Take 1 tablet (50 mg total) by mouth daily. Patient taking differently: Take 50 mg by mouth at bedtime. 10/26/15   Angiulli, Toribio PARAS, PA-C  timolol  (TIMOPTIC ) 0.5 % ophthalmic solution Place 1 drop into both eyes every morning. 08/14/20   [provider]  traMADol  (ULTRAM ) 50 MG tablet Take 50 mg by mouth every 6 (six) hours as needed for moderate pain. 03/21/18   [provider]  Travoprost, BAK Free, (TRAVATAN) 0.004 % SOLN ophthalmic solution Place 1 drop into both eyes at bedtime.     [provider]  traZODone  (DESYREL ) 100 MG tablet Take 100 mg by mouth at bedtime. 01/07/21   [provider]  zolpidem  (AMBIEN ) 10 MG tablet Take 10 mg by mouth at bedtime. 02/17/23   [provider]    Physical Exam: Vitals:   01/13/24 1630 01/13/24 1700 01/13/24 1730 01/13/24 1800  BP: 127/62 120/65 125/60 (!) 120/59  Pulse: (!) 52 (!) 55 (!) 52 (!) 54  Resp: 16 18 17 16   Temp:      TempSrc:      SpO2: 100% 100% 100% 100%  Weight:      Height:       *** Data Reviewed: {Tip this will not be part of the note when signed- Document your independent interpretation of telemetry tracing, EKG, lab, Radiology test or any other diagnostic tests. Add any new diagnostic test ordered  today. (Optional):26781} {Results:26384}  Assessment and Plan: No notes have been filed under this hospital service. Service: Hospitalist     Advance Care Planning:   Code Status: Full Code ***  Consults: ***  Family Communication: ***  Severity of Illness: {Observation/Inpatient:21159}  AuthorBETHA SIM KNOLL, MD 01/13/2024 6:34 PM  For on call review [email or web address].

## 2024-01-13 NOTE — ED Notes (Signed)
 CCMD called regarding order.

## 2024-01-13 NOTE — ED Notes (Signed)
 Patient transported to CT

## 2024-01-14 ENCOUNTER — Observation Stay (HOSPITAL_BASED_OUTPATIENT_CLINIC_OR_DEPARTMENT_OTHER)

## 2024-01-14 DIAGNOSIS — R55 Syncope and collapse: Secondary | ICD-10-CM

## 2024-01-14 LAB — GLUCOSE, CAPILLARY: Glucose-Capillary: 88 mg/dL (ref 70–99)

## 2024-01-14 LAB — BASIC METABOLIC PANEL WITH GFR
Anion gap: 9 (ref 5–15)
BUN: 12 mg/dL (ref 8–23)
CO2: 25 mmol/L (ref 22–32)
Calcium: 9.1 mg/dL (ref 8.9–10.3)
Chloride: 109 mmol/L (ref 98–111)
Creatinine, Ser: 0.9 mg/dL (ref 0.44–1.00)
GFR, Estimated: >60 mL/min (ref >60)
Glucose, Bld: 119 mg/dL — ABNORMAL HIGH (ref 70–99)
Potassium: 3.1 mmol/L — ABNORMAL LOW (ref 3.5–5.1)
Sodium: 143 mmol/L (ref 135–145)

## 2024-01-14 LAB — ECHOCARDIOGRAM COMPLETE
Area-P 1/2: 3.34 cm2
Est EF: >75
Height: 60 in
S' Lateral: 2.2 cm
Weight: 2064 [oz_av]

## 2024-01-14 LAB — MAGNESIUM: Magnesium: 1.7 mg/dL (ref 1.7–2.4)

## 2024-01-14 LAB — HIV ANTIBODY (ROUTINE TESTING W REFLEX): HIV Screen 4th Generation wRfx: NONREACTIVE

## 2024-01-14 NOTE — H&P (Incomplete)
 History and Physical    PatientBETHA [name] FMW:[id number] DOB: January 25, 1956 DOA: 01/13/2024 DOS: the patient was seen and examined on 01/13/2024 PCP: Sim Emery CROME, MD  Patient coming from: Home  Chief Complaint:  Chief Complaint  Patient presents with  . Bradycardia   HPI: [name] is a 68 y.o. female with medical history significant of remote history of alcohol abuse and tobacco abuse, history of hepatitis C, prior history of alcoholic pancreatitis, history of CVA, major depressive disorder, history of PTSD, seizure disorders, who was brought in by EMS from home due to bradycardia and hypotension.  Patient was apparently eating lunch with family when suddenly she slumped over for about 30 seconds.  When EMS arrived her heart rate was initially in the 40s and BP was 82 palpated.  Since then she has recovered.  Patient has received 500 cc bolus of fluid as well as 1 mg atropine.  In the ER she is stable but worried about syncopal episode at home.  Patient being admitted for workup of syncope.  Review of Systems: As mentioned in the history of present illness. All other systems reviewed and are negative. Past Medical History:  Diagnosis Date  . Alcohol abuse   . Chronic back pain   . Colitis   . CVA (cerebral infarction)   . Fatty liver   . Gastric ulcer   . GI bleed   . Hepatitis    Hepatitis C  . MDD (major depressive disorder)   . Pancreatitis   . PTSD (post-traumatic stress disorder)   . Seizures (HCC)   . 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 Stroke (HCC)   . UTI (lower urinary tract infection)   . Vision abnormalities    Past Surgical History:  Procedure Laterality Date  . ABDOMINAL HYSTERECTOMY    . APPENDECTOMY    . COLONOSCOPY WITH PROPOFOL  N/A 08/13/2015   Procedure: COLONOSCOPY WITH PROPOFOL ;  Surgeon: Toribio SHAUNNA Cedar, MD;  Location: [facility] ENDOSCOPY;  Service: Endoscopy;  Laterality: N/A;  . ESOPHAGOGASTRODUODENOSCOPY N/A 05/23/2014    Procedure: ESOPHAGOGASTRODUODENOSCOPY (EGD);  Surgeon: Gordy CHRISTELLA Starch, MD;  Location: [facility] ENDOSCOPY;  Service: Endoscopy;  Laterality: N/A;  . ESOPHAGOGASTRODUODENOSCOPY (EGD) WITH PROPOFOL  N/A 08/13/2015   Procedure: ESOPHAGOGASTRODUODENOSCOPY (EGD) WITH PROPOFOL ;  Surgeon: Toribio SHAUNNA Cedar, MD;  Location: [facility] ENDOSCOPY;  Service: Endoscopy;  Laterality: N/A;  . ESOPHAGOGASTRODUODENOSCOPY ENDOSCOPY    . gall stone removed     Social History:  reports that she has been smoking cigarettes. She has quit using smokeless tobacco. She reports that she does not drink alcohol and does not use drugs.  Allergies  Allergen Reactions  . Banana Other (See Comments)    Welts on skin and sores in mouth  . Penicillins Other (See Comments)    Blister Has patient had a PCN reaction causing immediate rash, facial/tongue/throat swelling, SOB or lightheadedness with hypotension: no Has patient had a PCN reaction causing severe rash involving mucus membranes or skin necrosis: yes - blisters Has patient had a PCN reaction that required hospitalization no Has patient had a PCN reaction occurring within the last 10 years: no If all of the above answers are NO, then may proceed with Cephalosporin use.   SABRA Caulk [Brinzolamide-Brimonidine] Swelling    Eyes swell shut    Family History  Problem Relation Age of Onset  . Stroke Mother   . Cancer Sister        type unknown  . Diabetes Father  Prior to Admission medications   Medication Sig Start Date End Date Taking? Authorizing Provider  acetaminophen  (TYLENOL ) 325 MG tablet Take 2 tablets (650 mg total) by mouth every 6 (six) hours as needed for mild pain, headache, fever or moderate pain. 01/31/21   Bryn Bernardino NOVAK, MD  albuterol  (PROVENTIL  HFA;VENTOLIN  HFA) 108 (90 BASE) MCG/ACT inhaler Inhale 2 puffs into the lungs every 8 (eight) hours as needed for wheezing or shortness of breath.    [provider]  atorvastatin  (LIPITOR ) 20 MG tablet Take  20 mg by mouth daily. 03/08/18   [provider]  buPROPion  (WELLBUTRIN ) 75 MG tablet Take 75 mg by mouth daily. 11/27/21   [provider]  clopidogrel  (PLAVIX ) 75 MG tablet Take 1 tablet (75 mg total) by mouth daily. 11/03/16   Gladis Inocente Coy, NP  fluticasone  (FLONASE ) 50 MCG/ACT nasal spray Place 1 spray into the nose daily as needed for allergies.  06/04/15   [provider]  gabapentin  (NEURONTIN ) 300 MG capsule Take 1 capsule (300 mg total) by mouth 3 (three) times daily. 10/26/15   Angiulli, Toribio PARAS, PA-C  hydrochlorothiazide  (HYDRODIURIL ) 25 MG tablet Take 25 mg by mouth daily. 09/20/18   [provider]  hydrocortisone cream 1 % Apply 1 Application topically 4 (four) times daily.    [provider]  lisinopril (PRINIVIL,ZESTRIL) 20 MG tablet Take 20 mg daily by mouth. 11/15/16   [provider]  omeprazole (PRILOSEC) 40 MG capsule Take 40 mg by mouth daily. 10/03/21   [provider]  rOPINIRole  (REQUIP ) 0.25 MG tablet Take 1 tablet (0.25 mg total) by mouth 3 (three) times daily. Patient taking differently: Take 0.25 mg by mouth every 8 (eight) hours. 12/02/15   Kirsteins, Prentice BRAVO, MD  senna-docusate (SENOKOT-S) 8.6-50 MG tablet Take 1 tablet by mouth 2 (two) times daily. Patient taking differently: Take 1 tablet by mouth daily as needed for mild constipation. 11/05/15   Tobie Burgess Opoka, MD  sertraline  (ZOLOFT ) 50 MG tablet Take 1 tablet (50 mg total) by mouth daily. Patient taking differently: Take 50 mg by mouth at bedtime. 10/26/15   Angiulli, Toribio PARAS, PA-C  timolol  (TIMOPTIC ) 0.5 % ophthalmic solution Place 1 drop into both eyes every morning. 08/14/20   [provider]  traMADol  (ULTRAM ) 50 MG tablet Take 50 mg by mouth every 6 (six) hours as needed for moderate pain. 03/21/18   [provider]  Travoprost, BAK Free, (TRAVATAN) 0.004 % SOLN ophthalmic solution Place 1 drop into both eyes at bedtime.      [provider]  traZODone  (DESYREL ) 100 MG tablet Take 100 mg by mouth at bedtime. 01/07/21   [provider]  zolpidem  (AMBIEN ) 10 MG tablet Take 10 mg by mouth at bedtime. 02/17/23   [provider]    Physical Exam: Vitals:   01/13/24 1630 01/13/24 1700 01/13/24 1730 01/13/24 1800  BP: 127/62 120/65 125/60 (!) 120/59  Pulse: (!) 52 (!) 55 (!) 52 (!) 54  Resp: 16 18 17 16   Temp:      TempSrc:      SpO2: 100% 100% 100% 100%  Weight:      Height:       Constitutional: Chronically ill looking, NAD, calm, comfortable Eyes: PERRL, lids and conjunctivae normal ENMT: Mucous membranes are moist. Posterior pharynx clear of any exudate or lesions.Normal dentition.  Neck: normal, supple, no masses, no thyromegaly Respiratory: clear to auscultation bilaterally, no wheezing, no crackles. Normal respiratory effort.  No accessory muscle use.  Cardiovascular: Sinus bradycardia, no murmurs / rubs / gallops. No extremity edema. 2+ pedal pulses. No carotid bruits.  Abdomen: no tenderness, no masses palpated. No hepatosplenomegaly. Bowel sounds positive.  Musculoskeletal: Good range of motion, no joint swelling or tenderness, Skin: no rashes, lesions, ulcers. No induration Neurologic: CN 2-12 grossly intact. Sensation intact, DTR normal. Strength 5/5 in all 4.  Psychiatric: Normal judgment and insight. Alert and oriented x 3. Normal mood  Data Reviewed:  Heart rate was 51, blood pressure 95/60, potassium 3.2, creatinine 1.06, hemoglobin 10.6, chest x-ray showed no acute findings.  Head CT without contrast showed stable findings.  EKG showed sinus bradycardia.  Assessment and Plan:  #1 syncope:    Advance Care Planning:   Code Status: Full Code ***  Consults: ***  Family Communication: ***  Severity of Illness: {Observation/Inpatient:21159}  AuthorBETHA SIM KNOLL, MD 01/13/2024 6:34 PM  For on call review [email or web address].

## 2024-01-14 NOTE — Progress Notes (Signed)
 PROGRESS NOTE    [name]  FMW:[id number] DOB: April 18, 1955 DOA: 01/13/2024 PCP: Sim Emery CROME, MD     Brief Narrative:  [name] is a 68 y.o. female with medical history significant of remote history of alcohol abuse and tobacco abuse, history of hepatitis C, prior history of alcoholic pancreatitis, history of CVA, major depressive disorder, history of PTSD, seizure disorders, who was brought in by EMS from home due to bradycardia and hypotension.  Patient was apparently eating lunch with family when suddenly she slumped over for about 30 seconds.  When EMS arrived her heart rate was initially in the 40s and BP was 82 palpated.  Since then she has recovered.  Patient has received 500 cc bolus of fluid as well as 1 mg atropine.  Patient being admitted for workup of syncope.   New events last 24 hours / Subjective: She states that this is not her first episode of syncope. She was sitting, eating, and had prodromal symptoms before having a syncopal episode yesterday. States she slid off her chair subsequently. Also admits that sometimes she has troubling swallowing food since her previous stroke.   Assessment & Plan:   Principal Problem:   Syncope and collapse Active Problems:   Essential hypertension   Asthma   ESOPHAGITIS, REFLUX   GERD   Mixed hyperlipidemia   Hypokalemia   Gastroesophageal reflux disease without esophagitis   Former smoker   Hyperlipidemia   Syncope - Echocardiogram - Carotid ultrasound - Orthostatic vital signs - Monitor on telemetry  Hypertension - Hold antihypertensives for now  Hyperlipidemia - Lipitor    GERD - PPI  Hypokalemia - Replete  Dysphagia - SLP eval    DVT prophylaxis:  enoxaparin  (LOVENOX ) injection 40 mg Start: 01/13/24 2100  Code Status: Full code Family Communication: None at bedside Disposition Plan: Home Status is: Observation The patient will require care spanning > 2 midnights and should be moved to inpatient  because: IV fluids    Antimicrobials:  Anti-infectives (From admission, onward)    None        Objective: Vitals:   01/13/24 2158 01/14/24 0030 01/14/24 0306 01/14/24 0810  BP: 107/66 (!) 111/56 110/61 (!) 111/58  Pulse: (!) 59 (!) 53 (!) 56 (!) 50  Resp: 17 18 17 17   Temp:  98.3 F (36.8 C) 98.2 F (36.8 C) 97.9 F (36.6 C)  TempSrc:  Oral Oral Oral  SpO2:  98% 96% 98%  Weight:      Height:        Intake/Output Summary (Last 24 hours) at 01/14/2024 1019 Last data filed at 01/14/2024 0926 Gross per 24 hour  Intake 54.69 ml  Output --  Net 54.69 ml   Filed Weights   01/13/24 1342  Weight: 58.5 kg    Examination:  General exam: Appears calm and comfortable  Respiratory system: Clear to auscultation. Respiratory effort normal. No respiratory distress. No conversational dyspnea.  Cardiovascular system: S1 & S2 heard, RRR. No murmurs. No pedal edema. Gastrointestinal system: Abdomen is nondistended, soft and nontender. Normal bowel sounds heard. Central nervous system: Alert and oriented. No focal neurological deficits. Speech clear.  Extremities: Symmetric in appearance  Skin: No rashes, lesions or ulcers on exposed skin  Psychiatry: Judgement and insight appear normal. Mood & affect appropriate.   Data Reviewed: I have personally reviewed following labs and imaging studies  CBC: Recent Labs  Lab 01/13/24 1416 01/13/24 2022  WBC 7.3 7.0  NEUTROABS 4.5  --   HGB 10.6*  12.4  HCT 33.6* 37.9  MCV 95.2 92.4  PLT 148* 174   Basic Metabolic Panel: Recent Labs  Lab 01/13/24 1416 01/13/24 2022  NA 145  --   K 3.2*  --   CL 111  --   CO2 25  --   GLUCOSE 65*  --   BUN 15  --   CREATININE 1.06* 0.98  CALCIUM  9.1  --   MG 1.9  --    GFR: Estimated Creatinine Clearance: 42.8 mL/min (by C-G formula based on SCr of 0.98 mg/dL). Liver Function Tests: Recent Labs  Lab 01/13/24 1416  AST 27  ALT 18  ALKPHOS 50  BILITOT 1.1  PROT 6.9  ALBUMIN 3.8    No results for input(s): LIPASE, AMYLASE in the last 168 hours. No results for input(s): AMMONIA in the last 168 hours. Coagulation Profile: No results for input(s): INR, PROTIME in the last 168 hours. Cardiac Enzymes: No results for input(s): CKTOTAL, CKMB, CKMBINDEX, TROPONINI in the last 168 hours. BNP (last 3 results) No results for input(s): PROBNP in the last 8760 hours. HbA1C: No results for input(s): HGBA1C in the last 72 hours. CBG: Recent Labs  Lab 01/14/24 0608  GLUCAP 88   Lipid Profile: No results for input(s): CHOL, HDL, LDLCALC, TRIG, CHOLHDL, LDLDIRECT in the last 72 hours. Thyroid  Function Tests: Recent Labs    01/13/24 1416  TSH 1.868  FREET4 0.73   Anemia Panel: No results for input(s): VITAMINB12, FOLATE, FERRITIN, TIBC, IRON, RETICCTPCT in the last 72 hours. Sepsis Labs: No results for input(s): PROCALCITON, LATICACIDVEN in the last 168 hours.  No results found for this or any previous visit (from the past 240 hours).    Radiology Studies: MR BRAIN WO CONTRAST Result Date: 01/14/2024 EXAM: MRI BRAIN WITHOUT CONTRAST 01/13/2024 11:55:34 PM TECHNIQUE: Multiplanar multisequence MRI of the head/brain was performed without the administration of intravenous contrast. COMPARISON: MR Head without and with contrast 03/26/2018 as well as CT from 01/13/2024. CLINICAL HISTORY: Syncope, presyncope, cerebrovascular cause suspected. FINDINGS: BRAIN AND VENTRICLES: No acute infarct. No intracranial hemorrhage. No mass. No midline shift. No hydrocephalus. Patchy T2/FLAIR hyperintensity involving the periventricular and deep white matter, consistent with chronic small vessel ischemic disease, moderate in nature. Several remote lacunar infarcts present about the bilateral basal ganglia. Small remote left cerebellar infarct. Remote lacunar infarct at the right pontomedullary junction. The sella is unremarkable. Normal flow  voids. ORBITS: Prior bilateral ocular lens replacement. SINUSES AND MASTOIDS: No acute abnormality. BONES AND SOFT TISSUES: Normal marrow signal. No acute soft tissue abnormality. IMPRESSION: 1. No acute intracranial abnormality. 2. Moderate chronic small vessel ischemic disease with several remote lacunar infarcts in the bilateral basal ganglia, left cerebellum, and right pontomedullary junction. Electronically signed by: Morene Hoard MD 01/14/2024 12:50 AM EDT RP Workstation: [id number]   CT Head Wo Contrast Result Date: 01/13/2024 CLINICAL DATA:  New onset headache EXAM: CT HEAD WITHOUT CONTRAST TECHNIQUE: Contiguous axial images were obtained from the base of the skull through the vertex without intravenous contrast. RADIATION DOSE REDUCTION: This exam was performed according to the departmental dose-optimization program which includes automated exposure control, adjustment of the mA and/or kV according to patient size and/or use of iterative reconstruction technique. COMPARISON:  02/20/2023 FINDINGS: Brain: Stable hypodensities within the basal ganglia and periventricular white matter compatible with chronic small vessel ischemic changes. No evidence of acute infarct or hemorrhage. Lateral ventricles and remaining midline structures are unremarkable. No acute extra-axial fluid collections. No mass effect. Vascular: No  hyperdense vessel or unexpected calcification. Skull: Normal. Negative for fracture or focal lesion. Sinuses/Orbits: No acute finding. Other: None. IMPRESSION: 1. Stable head CT, no acute intracranial process. Electronically Signed   By: Ozell Daring M.D.   On: 01/13/2024 15:32   DG Chest Portable 1 View Result Date: 01/13/2024 CLINICAL DATA:  Chest pain. EXAM: PORTABLE CHEST 1 VIEW COMPARISON:  02/20/2023. FINDINGS: The heart size and mediastinal contours are within normal limits. There is atherosclerotic calcification of the aorta. Mild airspace disease is present at the left lung  base. No effusion or pneumothorax is seen. No acute osseous abnormality. IMPRESSION: Minimal atelectasis at the left lung base. Electronically Signed   By: Leita Birmingham M.D.   On: 01/13/2024 14:15      Scheduled Meds:  atorvastatin   20 mg Oral Daily   enoxaparin  (LOVENOX ) injection  40 mg Subcutaneous Q24H   gabapentin   300 mg Oral TID   pantoprazole   40 mg Oral Daily   sodium chloride  flush  3 mL Intravenous Q12H   Continuous Infusions:  dextrose  5 % and 0.9 % NaCl 40 mL/hr at 01/14/24 0030     LOS: 0 days   Time spent: 25 minutes   Delon Hoe, DO [facility] 01/14/2024, 10:19 AM   Available via Epic secure chat 7am-7pm After these hours, please refer to coverage provider listed on [email or web address]

## 2024-01-14 NOTE — Plan of Care (Signed)
  Problem: Education: Goal: Knowledge of General Education information will improve Description: Including pain rating scale, medication(s)/side effects and non-pharmacologic comfort measures Outcome: Progressing   Problem: Health Behavior/Discharge Planning: Goal: Ability to manage health-related needs will improve Outcome: Progressing   Problem: Clinical Measurements: Goal: Ability to maintain clinical measurements within normal limits will improve Outcome: Progressing Goal: Will remain free from infection Outcome: Progressing Goal: Diagnostic test results will improve Outcome: Progressing Goal: Respiratory complications will improve Outcome: Progressing Goal: Cardiovascular complication will be avoided Outcome: Progressing   Problem: Activity: Goal: Risk for activity intolerance will decrease Outcome: Progressing   Problem: Nutrition: Goal: Adequate nutrition will be maintained Outcome: Progressing   Problem: Coping: Goal: Level of anxiety will decrease Outcome: Progressing   Problem: Elimination: Goal: Will not experience complications related to bowel motility Outcome: Progressing Goal: Will not experience complications related to urinary retention Outcome: Progressing   Problem: Cardiac: Goal: Will achieve and/or maintain adequate cardiac output Outcome: Progressing

## 2024-01-14 NOTE — Progress Notes (Signed)
 VASCULAR LAB    Carotid duplex has been performed.  See CV proc for preliminary results.   Jack Mineau, RVT 01/14/2024, 3:18 PM

## 2024-01-14 NOTE — Evaluation (Signed)
 Clinical/Bedside Swallow Evaluation Patient Details  Name: [name] MRN: [id number] Date of Birth: 06/05/1955  Today's Date: 01/14/2024 Time: SLP Start Time (ACUTE ONLY): 1147 SLP Stop Time (ACUTE ONLY): 1200 SLP Time Calculation (min) (ACUTE ONLY): 13 min  Past Medical History:  Past Medical History:  Diagnosis Date   Alcohol abuse    Chronic back pain    Colitis    CVA (cerebral infarction)    Fatty liver    Gastric ulcer    GI bleed    Hepatitis    Hepatitis C   MDD (major depressive disorder)    Pancreatitis    PTSD (post-traumatic stress disorder)    Seizures (HCC)    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) 11/05/2015   Stroke (HCC)    UTI (lower urinary tract infection)    Vision abnormalities    Past Surgical History:  Past Surgical History:  Procedure Laterality Date   ABDOMINAL HYSTERECTOMY     APPENDECTOMY     COLONOSCOPY WITH PROPOFOL  N/A 08/13/2015   Procedure: COLONOSCOPY WITH PROPOFOL ;  Surgeon: Toribio SHAUNNA Cedar, MD;  Location: [facility];  Service: Endoscopy;  Laterality: N/A;   ESOPHAGOGASTRODUODENOSCOPY N/A 05/23/2014   Procedure: ESOPHAGOGASTRODUODENOSCOPY (EGD);  Surgeon: Gordy CHRISTELLA Starch, MD;  Location: [facility] ENDOSCOPY;  Service: Endoscopy;  Laterality: N/A;   ESOPHAGOGASTRODUODENOSCOPY (EGD) WITH PROPOFOL  N/A 08/13/2015   Procedure: ESOPHAGOGASTRODUODENOSCOPY (EGD) WITH PROPOFOL ;  Surgeon: Toribio SHAUNNA Cedar, MD;  Location: [facility];  Service: Endoscopy;  Laterality: N/A;   ESOPHAGOGASTRODUODENOSCOPY ENDOSCOPY     gall stone removed     HPI:  [name] is a 68 y.o. female with medical history significant of remote history of alcohol abuse and tobacco abuse, history of hepatitis C, prior history of alcoholic pancreatitis, history of CVA, major depressive disorder, history of PTSD, seizure disorders, who was brought in by EMS from home due to bradycardia and hypotension.  MRI unremarkable for acute abnormalities. CXR remarkable for minimal  atelectasis at the left lung base.    Assessment / Plan / Recommendation  Clinical Impression  Pt was seen for a clinical swallow evaluation and present with suspected functional oropharyngeal swallowing abilities with concerns for esophogeal dysfunction.  Pt reported hx of GERD with medication management.  She was seen with trials of thin liquid, puree, and regular solids.  No overt s/sx of aspiration were observed, but pt reported globus sensation with all trials.  She stated that this has been ongoing for years, but has recently worsened.  Alternating bites and sips helped to alleviate symptoms.  Pt appears to have a low risk for prandial aspiration, but given GERD symptoms, she is at an increased risk for post-prandial aspiration.  Recommend continuation of Dysphagia 3 (soft) solids and thin liquids with strict adherence to aspiration and GERD precautions.  Additionally recommend an esophogram to further evalaute esophageal function.  No further skilled ST is warranted at this time.  Please consult if additional needs arise.  SLP Visit Diagnosis: Dysphagia, unspecified (R13.10)    Aspiration Risk  Mild aspiration risk    Diet Recommendation Thin liquid;Dysphagia 3 (Mech soft)    Liquid Administration via: Cup;Straw Medication Administration: Whole meds with puree Supervision: Patient able to self feed Compensations: Minimize environmental distractions;Slow rate;Small sips/bites;Follow solids with liquid Postural Changes: Seated upright at 90 degrees;Remain upright for at least 30 minutes after po intake    Other  Recommendations Oral Care Recommendations: Oral care BID     Assistance Recommended at Discharge  Functional Status Assessment Patient has had a recent decline in their functional status and demonstrates the ability to make significant improvements in function in a reasonable and predictable amount of time.  Frequency and Duration            Prognosis Prognosis for  improved oropharyngeal function: Good      Swallow Study   General Date of Onset: 01/14/24 HPI: [name] is a 68 y.o. female with medical history significant of remote history of alcohol abuse and tobacco abuse, history of hepatitis C, prior history of alcoholic pancreatitis, history of CVA, major depressive disorder, history of PTSD, seizure disorders, who was brought in by EMS from home due to bradycardia and hypotension.  MRI unremarkable for acute abnomalities. CXR remarkable for minimal atelectasis at the left lung base. Type of Study: Bedside Swallow Evaluation Previous Swallow Assessment: BSE and MBSS in 2014 Diet Prior to this Study: Dysphagia 3 (mechanical soft);Thin liquids (Level 0) Temperature Spikes Noted: No Respiratory Status: Room air History of Recent Intubation: No Behavior/Cognition: Alert;Cooperative;Pleasant mood Oral Cavity Assessment: Within Functional Limits Oral Care Completed by SLP: No Oral Cavity - Dentition: Missing dentition Vision: Functional for self-feeding Self-Feeding Abilities: Able to feed self Patient Positioning: Upright in bed Baseline Vocal Quality: Normal;Low vocal intensity Volitional Swallow: Able to elicit    Oral/Motor/Sensory Function Overall Oral Motor/Sensory Function: Within functional limits   Ice Chips Ice chips: Not tested   Thin Liquid Thin Liquid: Within functional limits    Nectar Thick Nectar Thick Liquid: Not tested   Honey Thick Honey Thick Liquid: Not tested   Puree Puree: Within functional limits Presentation: Self Fed   Solid     Solid: Within functional limits Presentation: Self Fed     [name], M.S., CCC-SLP Acute Rehabilitation Services Office: [phone]  [name] 01/14/2024,12:23 PM

## 2024-01-14 NOTE — Progress Notes (Signed)
  Echocardiogram 2D Echocardiogram has been performed.  [name] 01/14/2024, 2:19 PM

## 2024-01-14 NOTE — Plan of Care (Signed)

## 2024-01-15 ENCOUNTER — Observation Stay (HOSPITAL_COMMUNITY)
Admit: 2024-01-15 | Discharge: 2024-01-15 | Disposition: A | Discharge status: Home / Self Care | Attending: Physician Assistant | Admitting: Physician Assistant

## 2024-01-15 ENCOUNTER — Observation Stay (HOSPITAL_COMMUNITY)

## 2024-01-15 ENCOUNTER — Other Ambulatory Visit (HOSPITAL_COMMUNITY): Payer: Self-pay

## 2024-01-15 DIAGNOSIS — R55 Syncope and collapse: Secondary | ICD-10-CM | POA: Diagnosis not present

## 2024-01-15 LAB — MAGNESIUM: Magnesium: 1.6 mg/dL — ABNORMAL LOW (ref 1.7–2.4)

## 2024-01-15 LAB — BASIC METABOLIC PANEL WITH GFR
Anion gap: 10 (ref 5–15)
BUN: 11 mg/dL (ref 8–23)
CO2: 24 mmol/L (ref 22–32)
Calcium: 8.8 mg/dL — ABNORMAL LOW (ref 8.9–10.3)
Chloride: 108 mmol/L (ref 98–111)
Creatinine, Ser: 0.98 mg/dL (ref 0.44–1.00)
GFR, Estimated: >60 mL/min (ref >60)
Glucose, Bld: 97 mg/dL (ref 70–99)
Potassium: 3.7 mmol/L (ref 3.5–5.1)
Sodium: 142 mmol/L (ref 135–145)

## 2024-01-15 LAB — GLUCOSE, CAPILLARY: Glucose-Capillary: 96 mg/dL (ref 70–99)

## 2024-01-15 MED ORDER — MAGNESIUM 400 MG PO TABS: 400.0000 mg | ORAL_TABLET | Freq: Every day | ORAL | 0 refills | Status: AC

## 2024-01-15 NOTE — Progress Notes (Signed)
 ZIO AT applied at hospital. Dr. Shelda Bruckner to read.

## 2024-01-15 NOTE — Progress Notes (Signed)
 Patient verbalized understanding of DC instructions, IV removed. Pt escorted to DC lounge.

## 2024-01-15 NOTE — Discharge Summary (Signed)
 Physician Discharge Summary  [name] FMW:[id number] DOB: 1956/02/14 DOA: 01/13/2024  PCP: Sim Emery CROME, MD  Admit date: 01/13/2024 Discharge date: 01/15/2024  Admitted From: Home Disposition: Home  Recommendations for Outpatient Follow-up:  Follow up with PCP in 1-2 weeks Cardiology to schedule follow-up  Home Health: N/A Equipment/Devices: N/A  Discharge Condition: Stable CODE STATUS: Full code Diet recommendation: Regular diet, nutritional supplements  Discharge summary: 68 year old with remote history of alcohol use and smoker, history of hepatitis C, history of a stroke, major depressive disorder, PTSD, brought to the emergency room when she suddenly slumped over for about 30 seconds while eating lunch with family.  EMS was called.  Reportedly heart rate was 40 and blood pressure was 82, given IV fluid boluses with improvement of symptoms and admitted to the hospital.  In the emergency room she was hemodynamically stable and in normal sinus rhythm.  Syncope and collapse: Likely vasovagal.  Also suspect brady- arrhythmia.  Patient has remained with sinus rhythm and without any arrhythmias during hospitalization since last 48 hours. Brain MRI with no acute findings. 2D echocardiogram, essentially normal. Carotid duplexes, no hemodynamically significant stenosis. Patient will benefit with ambulatory cardiac monitoring, this will be arranged today before discharge and will be followed up by cardiology.  Hypertension: Her blood pressures are normal to low normal.  Patient does not need any antihypertensives at this time.  Will avoid lisinopril as well as diuretics for the time being.  Hyperlipidemia: Continue statin.  Electrolytes: Adequate.  Magnesium 1.6.  Will discharge with magnesium supplementation.  Stable to go home with close outpatient follow-up.    Discharge Diagnoses:  Principal Problem:   Syncope and collapse Active Problems:   Essential hypertension    Asthma   ESOPHAGITIS, REFLUX   GERD   Mixed hyperlipidemia   Hypokalemia   Gastroesophageal reflux disease without esophagitis   Former smoker   Hyperlipidemia    Discharge Instructions  Discharge Instructions     Diet general   Complete by: As directed    Increase activity slowly   Complete by: As directed       Allergies as of 01/15/2024       Reactions   Banana Other (See Comments)   Welts on skin and sores in mouth   Penicillins Other (See Comments)   Blister Has patient had a PCN reaction causing immediate rash, facial/tongue/throat swelling, SOB or lightheadedness with hypotension: no Has patient had a PCN reaction causing severe rash involving mucus membranes or skin necrosis: yes - blisters Has patient had a PCN reaction that required hospitalization no Has patient had a PCN reaction occurring within the last 10 years: no If all of the above answers are NO, then may proceed with Cephalosporin use.   Simbrinza [brinzolamide-brimonidine] Swelling   Eyes swell shut        Medication List     STOP taking these medications    hydrochlorothiazide  25 MG tablet Commonly known as: HYDRODIURIL    lisinopril 20 MG tablet Commonly known as: ZESTRIL       TAKE these medications    acetaminophen  325 MG tablet Commonly known as: TYLENOL  Take 2 tablets (650 mg total) by mouth every 6 (six) hours as needed for mild pain, headache, fever or moderate pain.   albuterol  108 (90 Base) MCG/ACT inhaler Commonly known as: VENTOLIN  HFA Inhale 2 puffs into the lungs every 8 (eight) hours as needed for wheezing or shortness of breath.   atorvastatin  20 MG tablet Commonly known as:  LIPITOR  Take 20 mg by mouth daily.   buPROPion  75 MG tablet Commonly known as: WELLBUTRIN  Take 75 mg by mouth daily.   clopidogrel  75 MG tablet Commonly known as: PLAVIX  Take 1 tablet (75 mg total) by mouth daily.   fluticasone  50 MCG/ACT nasal spray Commonly known as: FLONASE  Place  1 spray into the nose daily as needed for allergies.   gabapentin  300 MG capsule Commonly known as: NEURONTIN  Take 1 capsule (300 mg total) by mouth 3 (three) times daily.   hydrocortisone cream 1 % Apply 1 Application topically 4 (four) times daily.   Magnesium 400 MG Tabs Take 400 mg by mouth daily at 6 (six) AM.   omeprazole 40 MG capsule Commonly known as: PRILOSEC Take 40 mg by mouth daily.   oxyCODONE -acetaminophen  5-325 MG tablet Commonly known as: PERCOCET/ROXICET Take 1 tablet by mouth in the morning and at bedtime.   rOPINIRole  0.25 MG tablet Commonly known as: REQUIP  Take 1 tablet (0.25 mg total) by mouth 3 (three) times daily.   senna-docusate 8.6-50 MG tablet Commonly known as: Senokot-S Take 1 tablet by mouth 2 (two) times daily. What changed:  when to take this reasons to take this   sertraline  50 MG tablet Commonly known as: ZOLOFT  Take 1 tablet (50 mg total) by mouth daily.   timolol  0.5 % ophthalmic solution Commonly known as: TIMOPTIC  Place 1 drop into both eyes every morning.   Travoprost (BAK Free) 0.004 % Soln ophthalmic solution Commonly known as: TRAVATAN Place 1 drop into both eyes at bedtime.   traZODone  150 MG tablet Commonly known as: DESYREL  Take 150 mg by mouth at bedtime.   zolpidem  10 MG tablet Commonly known as: AMBIEN  Take 10 mg by mouth at bedtime.        Allergies  Allergen Reactions   Banana Other (See Comments)    Welts on skin and sores in mouth   Penicillins Other (See Comments)    Blister Has patient had a PCN reaction causing immediate rash, facial/tongue/throat swelling, SOB or lightheadedness with hypotension: no Has patient had a PCN reaction causing severe rash involving mucus membranes or skin necrosis: yes - blisters Has patient had a PCN reaction that required hospitalization no Has patient had a PCN reaction occurring within the last 10 years: no If all of the above answers are NO, then may proceed  with Cephalosporin use.    Simbrinza [Brinzolamide-Brimonidine] Swelling    Eyes swell shut    Consultations: Cardiology clinic for ambulatory monitoring   Procedures/Studies: DG ESOPHAGUS W SINGLE CM (SOL OR THIN BA) Result Date: 01/15/2024 CLINICAL DATA:  Dysphagia. Reported globus sensation when swallowing liquids and solids. Consult for esophagram for further evaluation. EXAM: ESOPHAGUS/BARIUM SWALLOW/TABLET STUDY TECHNIQUE: Single contrast examination was performed using thin liquid barium. Exam limited secondary to poor patient mobility, patient unable to stand or rotate on fluoroscopy table. This exam was performed by Kimble Clas, PA-C, and was supervised and interpreted by Dr. MARLA Kilts. FLUOROSCOPY: Radiation Exposure Index (as provided by the fluoroscopic device): 25.20 mGy Kerma COMPARISON:  None Available. FINDINGS: Swallowing: Unable to directly assess due to limited study. No gross abnormality Pharynx: Unable to directly assess due to limited study. No gross abnormality Esophagus: Normal appearance. Esophageal motility: Within normal limits. Hiatal Hernia: None. Gastroesophageal reflux: Small amount of reflux noted to the distal esophagus. Ingested 13 mm barium tablet: Passed after multiple sips of water. Briefly paused at the GE junction, although no evidence of focal narrowing or stricture.  Other: None. IMPRESSION: Limited exam secondary to patient immobility. No gross esophageal stricture identified. Gastroesophageal reflux to the distal esophagus. Electronically Signed   By: Rockey Kilts M.D.   On: 01/15/2024 12:49   VAS US  CAROTID Result Date: 01/15/2024 Carotid Arterial Duplex Study Patient Name:  [name]  Date of Exam:   01/14/2024 Medical Rec #: [id number]     Accession #:    [id number] Date of Birth: 1955/03/23     Patient Gender: F Patient Age:   2 years Exam Location:  [facility] Procedure:      VAS US  CAROTID Referring Phys: DELON CHOI  --------------------------------------------------------------------------------  Indications:       Syncope. Risk Factors:      Hypertension, hyperlipidemia, past history of smoking, prior                    CVA. Other Factors:     Bradycardia, hypotension. History of polysubstance abuse, in                    remission. Comparison Study:  Prior carotid duplex indicating <39% ICA stenosis done                    10/09/12 Performing Technologist: Alberta Lis RVS  Examination Guidelines: A complete evaluation includes B-mode imaging, spectral Doppler, color Doppler, and power Doppler as needed of all accessible portions of each vessel. Bilateral testing is considered an integral part of a complete examination. Limited examinations for reoccurring indications may be performed as noted.  Right Carotid Findings: +----------+--------+--------+--------+------------------+------------------+           PSV cm/sEDV cm/sStenosisPlaque DescriptionComments           +----------+--------+--------+--------+------------------+------------------+ CCA Prox  88      15                                intimal thickening +----------+--------+--------+--------+------------------+------------------+ CCA Distal102     17              calcific                             +----------+--------+--------+--------+------------------+------------------+ ICA Prox  142     27      1-39%   calcific                             +----------+--------+--------+--------+------------------+------------------+ ICA Mid   150     37                                                   +----------+--------+--------+--------+------------------+------------------+ ICA Distal149     38                                                   +----------+--------+--------+--------+------------------+------------------+ ECA       118     13                                                    +----------+--------+--------+--------+------------------+------------------+ +----------+--------+-------+--------+-------------------+  PSV cm/sEDV cmsDescribeArm Pressure (mmHG) +----------+--------+-------+--------+-------------------+ Subclavian301                                        +----------+--------+-------+--------+-------------------+ +---------+--------+--+--------+--+ VertebralPSV cm/s57EDV cm/s10 +---------+--------+--+--------+--+  Left Carotid Findings: +----------+--------+--------+--------+------------------+------------------+           PSV cm/sEDV cm/sStenosisPlaque DescriptionComments           +----------+--------+--------+--------+------------------+------------------+ CCA Prox  82      18                                intimal thickening +----------+--------+--------+--------+------------------+------------------+ CCA Distal84      20              homogeneous                          +----------+--------+--------+--------+------------------+------------------+ ICA Prox  148     27      1-39%   homogeneous                          +----------+--------+--------+--------+------------------+------------------+ ICA Mid   126     19                                                   +----------+--------+--------+--------+------------------+------------------+ ICA Distal123     25                                                   +----------+--------+--------+--------+------------------+------------------+ ECA       260     19      >50%    homogeneous                          +----------+--------+--------+--------+------------------+------------------+ +----------+--------+--------+--------+-------------------+           PSV cm/sEDV cm/sDescribeArm Pressure (mmHG) +----------+--------+--------+--------+-------------------+ [id number]                                          +----------+--------+--------+--------+-------------------+ +---------+--------+--+--------+--+ VertebralPSV cm/s65EDV cm/s16 +---------+--------+--+--------+--+   Summary: Right Carotid: Velocities in the right ICA are consistent with a 1-39% stenosis. Left Carotid: Velocities in the left ICA are consistent with a 1-39% stenosis.               The ECA appears >50% stenosed. Vertebrals:  Bilateral vertebral arteries demonstrate antegrade flow. Subclavians: Normal flow hemodynamics were seen in bilateral subclavian              arteries. *See table(s) above for measurements and observations.  Electronically signed by Norman Serve on 01/15/2024 at 8:12:26 AM.    Final    ECHOCARDIOGRAM COMPLETE Result Date: 01/14/2024    ECHOCARDIOGRAM REPORT   Patient Name:   SHIMIKA AMES Date of Exam: 01/14/2024 Medical Rec #:  [id number]    Height:       60.0 in Accession #:    [id number]  Weight:       129.0 lb Date of Birth:  01-Oct-1955    BSA:          1.549 m Patient Age:    68 years     BP:           103/51 mmHg Patient Gender: F            HR:           52 bpm. Exam Location:  Inpatient Procedure: 2D Echo (Both Spectral and Color Flow Doppler were utilized during            procedure). Indications:    syncope  History:        Patient has prior history of Echocardiogram examinations, most                 recent 01/29/2021. History of stroke; Risk Factors:Hypertension,                 Dyslipidemia and Former Smoker.  Sonographer:    Tinnie Barefoot RDCS Referring Phys: [id number] MOHAMMAD L GARBA IMPRESSIONS  1. Left ventricular ejection fraction, by estimation, is >75%. The left ventricle has hyperdynamic function. The left ventricle has no regional wall motion abnormalities. Left ventricular diastolic parameters are consistent with Grade I diastolic dysfunction (impaired relaxation).  2. Right ventricular systolic function is normal. The right ventricular size is normal. There is normal pulmonary artery systolic pressure.   3. The mitral valve is normal in structure. Trivial mitral valve regurgitation. No evidence of mitral stenosis.  4. The aortic valve is normal in structure. Aortic valve regurgitation is not visualized. No aortic stenosis is present.  5. The inferior vena cava is normal in size with greater than 50% respiratory variability, suggesting right atrial pressure of 3 mmHg. Comparison(s): No significant change from prior study. FINDINGS  Left Ventricle: Left ventricular ejection fraction, by estimation, is >75%. The left ventricle has hyperdynamic function. The left ventricle has no regional wall motion abnormalities. Strain was performed and the global longitudinal strain is indeterminate. The left ventricular internal cavity size was normal in size. There is no left ventricular hypertrophy. Left ventricular diastolic parameters are consistent with Grade I diastolic dysfunction (impaired relaxation). Right Ventricle: The right ventricular size is normal. No increase in right ventricular wall thickness. Right ventricular systolic function is normal. There is normal pulmonary artery systolic pressure. The tricuspid regurgitant velocity is 2.80 m/s, and  with an assumed right atrial pressure of 3 mmHg, the estimated right ventricular systolic pressure is 34.4 mmHg. Left Atrium: Left atrial size was normal in size. Right Atrium: Right atrial size was normal in size. Pericardium: There is no evidence of pericardial effusion. Mitral Valve: The mitral valve is normal in structure. Trivial mitral valve regurgitation. No evidence of mitral valve stenosis. Tricuspid Valve: The tricuspid valve is normal in structure. Tricuspid valve regurgitation is mild . No evidence of tricuspid stenosis. Aortic Valve: The aortic valve is normal in structure. Aortic valve regurgitation is not visualized. No aortic stenosis is present. Pulmonic Valve: The pulmonic valve was grossly normal. Pulmonic valve regurgitation is trivial. No evidence of  pulmonic stenosis. Aorta: The aortic root and ascending aorta are structurally normal, with no evidence of dilitation. Venous: The inferior vena cava is normal in size with greater than 50% respiratory variability, suggesting right atrial pressure of 3 mmHg. IAS/Shunts: No atrial level shunt detected by color flow Doppler. Additional Comments: 3D was performed not requiring image post processing on an  independent workstation and was indeterminate.  LEFT VENTRICLE PLAX 2D LVIDd:         3.90 cm   Diastology LVIDs:         2.20 cm   LV e' medial:    7.40 cm/s LV PW:         0.80 cm   LV E/e' medial:  13.4 LV IVS:        0.70 cm   LV e' lateral:   9.46 cm/s LVOT diam:     1.80 cm   LV E/e' lateral: 10.5 LV SV:         62 LV SV Index:   40 LVOT Area:     2.54 cm  RIGHT VENTRICLE          IVC RV Basal diam:  2.20 cm  IVC diam: 1.90 cm LEFT ATRIUM           Index        RIGHT ATRIUM          Index LA diam:      3.20 cm 2.07 cm/m   RA Area:     9.23 cm LA Vol (A2C): 25.9 ml 16.72 ml/m  RA Volume:   19.00 ml 12.26 ml/m  AORTIC VALVE LVOT Vmax:   114.00 cm/s LVOT Vmean:  67.700 cm/s LVOT VTI:    0.244 m  AORTA Ao Root diam: 2.70 cm Ao Asc diam:  3.00 cm MITRAL VALVE               TRICUSPID VALVE MV Area (PHT): 3.34 cm    TR Peak grad:   31.4 mmHg MV Decel Time: 227 msec    TR Vmax:        280.00 cm/s MV E velocity: 99.40 cm/s MV A velocity: 79.20 cm/s  SHUNTS MV E/A ratio:  1.26        Systemic VTI:  0.24 m                            Systemic Diam: 1.80 cm Vishnu Priya Mallipeddi Electronically signed by Diannah Late Mallipeddi Signature Date/Time: 01/14/2024/3:49:10 PM    Final    MR BRAIN WO CONTRAST Result Date: 01/14/2024 EXAM: MRI BRAIN WITHOUT CONTRAST 01/13/2024 11:55:34 PM TECHNIQUE: Multiplanar multisequence MRI of the head/brain was performed without the administration of intravenous contrast. COMPARISON: MR Head without and with contrast 03/26/2018 as well as CT from 01/13/2024. CLINICAL HISTORY: Syncope,  presyncope, cerebrovascular cause suspected. FINDINGS: BRAIN AND VENTRICLES: No acute infarct. No intracranial hemorrhage. No mass. No midline shift. No hydrocephalus. Patchy T2/FLAIR hyperintensity involving the periventricular and deep white matter, consistent with chronic small vessel ischemic disease, moderate in nature. Several remote lacunar infarcts present about the bilateral basal ganglia. Small remote left cerebellar infarct. Remote lacunar infarct at the right pontomedullary junction. The sella is unremarkable. Normal flow voids. ORBITS: Prior bilateral ocular lens replacement. SINUSES AND MASTOIDS: No acute abnormality. BONES AND SOFT TISSUES: Normal marrow signal. No acute soft tissue abnormality. IMPRESSION: 1. No acute intracranial abnormality. 2. Moderate chronic small vessel ischemic disease with several remote lacunar infarcts in the bilateral basal ganglia, left cerebellum, and right pontomedullary junction. Electronically signed by: Morene Hoard MD 01/14/2024 12:50 AM EDT RP Workstation: [id number]   CT Head Wo Contrast Result Date: 01/13/2024 CLINICAL DATA:  New onset headache EXAM: CT HEAD WITHOUT CONTRAST TECHNIQUE: Contiguous axial images were obtained from the base of the skull through  the vertex without intravenous contrast. RADIATION DOSE REDUCTION: This exam was performed according to the departmental dose-optimization program which includes automated exposure control, adjustment of the mA and/or kV according to patient size and/or use of iterative reconstruction technique. COMPARISON:  02/20/2023 FINDINGS: Brain: Stable hypodensities within the basal ganglia and periventricular white matter compatible with chronic small vessel ischemic changes. No evidence of acute infarct or hemorrhage. Lateral ventricles and remaining midline structures are unremarkable. No acute extra-axial fluid collections. No mass effect. Vascular: No hyperdense vessel or unexpected calcification. Skull:  Normal. Negative for fracture or focal lesion. Sinuses/Orbits: No acute finding. Other: None. IMPRESSION: 1. Stable head CT, no acute intracranial process. Electronically Signed   By: Ozell Daring M.D.   On: 01/13/2024 15:32   DG Chest Portable 1 View Result Date: 01/13/2024 CLINICAL DATA:  Chest pain. EXAM: PORTABLE CHEST 1 VIEW COMPARISON:  02/20/2023. FINDINGS: The heart size and mediastinal contours are within normal limits. There is atherosclerotic calcification of the aorta. Mild airspace disease is present at the left lung base. No effusion or pneumothorax is seen. No acute osseous abnormality. IMPRESSION: Minimal atelectasis at the left lung base. Electronically Signed   By: Leita Birmingham M.D.   On: 01/13/2024 14:15   (Echo, Carotid, EGD, Colonoscopy, ERCP)    Subjective: Patient seen and examined.  No overnight events.  Patient herself denies any complaints.  Called and discussed, updated patient's niece on the phone.  Telemetry monitor with sinus rhythm.  Patient tells me that she is comfortable going home, she uses a rolling walker at home.   Discharge Exam: Vitals:   01/15/24 0921 01/15/24 1143  BP: (!) 125/56 (!) 104/59  Pulse: 62 60  Resp:    Temp: 98.4 F (36.9 C)   SpO2: 95% 95%   Vitals:   01/15/24 0441 01/15/24 0513 01/15/24 0921 01/15/24 1143  BP: (!) 110/54  (!) 125/56 (!) 104/59  Pulse: (!) 52  62 60  Resp: 19     Temp: 98.6 F (37 C)  98.4 F (36.9 C)   TempSrc: Oral  Oral   SpO2: 96%  95% 95%  Weight:  56.4 kg    Height:        General: Pt is alert, awake, not in any acute distress. Cardiovascular: RRR, S1/S2 +, no rubs, no gallops Respiratory: CTA bilaterally, no wheezing, no rhonchi Abdominal: Soft, NT, ND, bowel sounds + Extremities: no edema, no cyanosis    The results of significant diagnostics from this hospitalization (including imaging, microbiology, ancillary and laboratory) are listed below for reference.     Microbiology: No results  found for this or any previous visit (from the past 240 hours).   Labs: BNP (last 3 results) No results for input(s): BNP in the last 8760 hours. Basic Metabolic Panel: Recent Labs  Lab 01/13/24 1416 01/13/24 2022 01/14/24 1048 01/15/24 0152  NA 145  --  143 142  K 3.2*  --  3.1* 3.7  CL 111  --  109 108  CO2 25  --  25 24  GLUCOSE 65*  --  119* 97  BUN 15  --  12 11  CREATININE 1.06* 0.98 0.90 0.98  CALCIUM  9.1  --  9.1 8.8*  MG 1.9  --  1.7 1.6*   Liver Function Tests: Recent Labs  Lab 01/13/24 1416  AST 27  ALT 18  ALKPHOS 50  BILITOT 1.1  PROT 6.9  ALBUMIN 3.8   No results for input(s): LIPASE, AMYLASE in  the last 168 hours. No results for input(s): AMMONIA in the last 168 hours. CBC: Recent Labs  Lab 01/13/24 1416 01/13/24 2022  WBC 7.3 7.0  NEUTROABS 4.5  --   HGB 10.6* 12.4  HCT 33.6* 37.9  MCV 95.2 92.4  PLT 148* 174   Cardiac Enzymes: No results for input(s): CKTOTAL, CKMB, CKMBINDEX, TROPONINI in the last 168 hours. BNP: Invalid input(s): POCBNP CBG: Recent Labs  Lab 01/14/24 0608 01/15/24 0442  GLUCAP 88 96   D-Dimer Recent Labs    01/13/24 1720  DDIMER 0.42   Hgb A1c No results for input(s): HGBA1C in the last 72 hours. Lipid Profile No results for input(s): CHOL, HDL, LDLCALC, TRIG, CHOLHDL, LDLDIRECT in the last 72 hours. Thyroid  function studies Recent Labs    01/13/24 1416  TSH 1.868   Anemia work up No results for input(s): VITAMINB12, FOLATE, FERRITIN, TIBC, IRON, RETICCTPCT in the last 72 hours. Urinalysis    Component Value Date/Time   COLORURINE STRAW (A) 02/20/2023 2255   APPEARANCEUR CLEAR 02/20/2023 2255   APPEARANCEUR Clear 12/10/2015 1023   LABSPEC 1.005 02/20/2023 2255   PHURINE 7.0 02/20/2023 2255   GLUCOSEU NEGATIVE 02/20/2023 2255   HGBUR NEGATIVE 02/20/2023 2255   BILIRUBINUR NEGATIVE 02/20/2023 2255   BILIRUBINUR Negative 12/10/2015 1023   KETONESUR  NEGATIVE 02/20/2023 2255   PROTEINUR NEGATIVE 02/20/2023 2255   UROBILINOGEN 0.2 05/22/2014 0451   NITRITE NEGATIVE 02/20/2023 2255   LEUKOCYTESUR NEGATIVE 02/20/2023 2255   Sepsis Labs Recent Labs  Lab 01/13/24 1416 01/13/24 2022  WBC 7.3 7.0   Microbiology No results found for this or any previous visit (from the past 240 hours).   Time coordinating discharge: 35 minutes  SIGNED:   Renato Applebaum, MD  [facility] 01/15/2024, 1:24 PM

## 2024-01-17 ENCOUNTER — Ambulatory Visit: Attending: Optometry | Admitting: Occupational Therapy

## 2024-01-17 DIAGNOSIS — H543 Unqualified visual loss, both eyes: Secondary | ICD-10-CM | POA: Diagnosis present

## 2024-01-17 DIAGNOSIS — R41842 Visuospatial deficit: Secondary | ICD-10-CM | POA: Diagnosis present

## 2024-01-17 DIAGNOSIS — H409 Unspecified glaucoma: Secondary | ICD-10-CM | POA: Insufficient documentation

## 2024-01-17 NOTE — Therapy (Signed)
 OUTPATIENT OCCUPATIONAL THERAPY LOW VISION TREATMENT  Patient Name: [name] MRN: [id number] DOB:1956-02-08, 68 y.o., female Today's Date: 01/17/2024  PCP: [name]  REFERRING PROVIDER: Octavia Bruckner, MD  END OF SESSION:  OT End of Session - 01/17/24 1156     Visit Number 10    Number of Visits 15    Date for Recertification  03/22/24    Authorization Type Medicare    OT Start Time 1148    OT Stop Time 1227    OT Time Calculation (min) 39 min    Activity Tolerance Patient tolerated treatment well    Behavior During Therapy WFL for tasks assessed/performed         Past Medical History:  Diagnosis Date   Alcohol abuse    Chronic back pain    Colitis    CVA (cerebral infarction)    Fatty liver    Gastric ulcer    GI bleed    Hepatitis    Hepatitis C   MDD (major depressive disorder)    Pancreatitis    PTSD (post-traumatic stress disorder)    Seizures (HCC)    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) 11/05/2015   Stroke (HCC)    UTI (lower urinary tract infection)    Vision abnormalities    Past Surgical History:  Procedure Laterality Date   ABDOMINAL HYSTERECTOMY     APPENDECTOMY     COLONOSCOPY WITH PROPOFOL  N/A 08/13/2015   Procedure: COLONOSCOPY WITH PROPOFOL ;  Surgeon: Toribio SHAUNNA Cedar, MD;  Location: [facility];  Service: Endoscopy;  Laterality: N/A;   ESOPHAGOGASTRODUODENOSCOPY N/A 05/23/2014   Procedure: ESOPHAGOGASTRODUODENOSCOPY (EGD);  Surgeon: Gordy CHRISTELLA Starch, MD;  Location: [facility] ENDOSCOPY;  Service: Endoscopy;  Laterality: N/A;   ESOPHAGOGASTRODUODENOSCOPY (EGD) WITH PROPOFOL  N/A 08/13/2015   Procedure: ESOPHAGOGASTRODUODENOSCOPY (EGD) WITH PROPOFOL ;  Surgeon: Toribio SHAUNNA Cedar, MD;  Location: [facility];  Service: Endoscopy;  Laterality: N/A;   ESOPHAGOGASTRODUODENOSCOPY ENDOSCOPY     gall stone removed     Patient Active Problem List   Diagnosis Date Noted   Syncope and collapse 01/13/2024   Mallet toe, acquired,  unspecified laterality 11/26/2021   Callus 08/23/2021   Pincer nail deformity 05/19/2021   Hypotension 01/28/2021   AKI (acute kidney injury) 01/28/2021   Prolonged QT interval 01/28/2021   Influenza A 01/28/2021   Plantar fasciitis of right foot 01/15/2021   Hyperlipidemia 11/03/2016   Former smoker 01/11/2016   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/05/2015   Left hip pain    History of GI bleed    Spastic bladder    Chronic back pain    ETOH abuse    Hypokalemia    PTSD (post-traumatic stress disorder)    Gastroesophageal reflux disease without esophagitis    Acute ischemic stroke (HCC) 09/27/2015   Acute CVA (cerebrovascular accident) (HCC) 09/27/2015   Smoker    Left-sided weakness 09/26/2015   Acute gastric ulcer    GI bleed 05/22/2014   Acute GI bleeding 05/22/2014   Acute blood loss anemia 05/22/2014   History of stroke 05/22/2014   Mixed hyperlipidemia 05/22/2014   Gastrointestinal hemorrhage with melena    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) 10/09/2012   Flank pain 10/09/2012   Right pontine stroke (HCC) 10/09/2012   ABDOMINAL PAIN OTHER SPECIFIED SITE 05/17/2010   Alcohol abuse 06/16/2007   Cocaine abuse (HCC) 06/16/2007   DEPRESSION 06/16/2007   Essential hypertension 06/16/2007  Cerebral artery occlusion with cerebral infarction (HCC) 06/16/2007   Asthma 06/16/2007   GERD 06/16/2007   PANCREATITIS 11/09/2006   ESOPHAGITIS, REFLUX 05/23/2006   ONSET DATE: 09/11/2023 (Date of referral); 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 She requests to add 2 contacts to her speed dial today.    Pt accompanied by: self  PERTINENT HISTORY: PMH: Severe Glaucoma, CVA, MDD, PTSD, HTN, asthma, Hepatitis, seizures, chronic back pain, cocaine abuse, and alcohol abuse  PAIN:  Are you having pain? No  FALLS: Has patient fallen in last 6 months? Yes. Number of falls 3 reporting L knee gave out  LIVING ENVIRONMENT: Lives with: lives with their family Lives in: House/apartment Stairs: Yes: Internal: 12 steps; on right going up and External: 1 steps; none Has following equipment at home: Walker - 4 wheeled, shower chair, and bed side commode  PLOF: Independent with basic ADLs and Needs assistance with gait; retired futures trader; liked to crochet  PATIENT GOALS: learn low vision strategies  OBJECTIVE:   GLASSES: Wears glasses mostly for seeing the TV  CURRENT MODIFICATIONS/ADAPTIONS: Niece reads for her  DEVICES: Android based tablet for [employer]  - gets locked out, hard to see  PHONE: flip phone with large buttons for phone calls  HAND DOMINANCE: Left  ADLs: Overall ADLs: mod I  IADLs: Shopping: walks around [place] to pick out items Light housekeeping: sweeps floors and cleans tables, feels surfaces to make sure it's fully clean Meal Prep: uses microwave, liked to cook spare ribs and deviled eggs Community mobility: dependent Medication management: mod I Financial management: dependent Handwriting: hasn't written well since her stroke  MOBILITY STATUS: Needs Assist: rollator and SBA for safety  FUNCTIONAL OUTCOME MEASURES: PSFS: 1.3 total score   COGNITION: Overall cognitive status: Within functional limits for tasks assessed  VISUAL FINDINGS: Baseline vision: wears glasses for TV Visual history: glaucoma, cataracts, and corrective eye surgery  OBSERVATIONS: Pt ambulates with use of rollator. No loss of balance though requires tactile and verbal cues to navigate clinic environment.  TODAY'S TREATMENT:     Client was educated on tablet use including:  OT  reviewed colored volume down portion of case purple and volume up portion of case black to help with discrimination. Pt able to turn the volume up and down by utilizing the toggle feature after hitting the volume button. Pt required increased time and min cueing for proper completion.   OT reviewed use of power/lock button. Pt demonstrated use with min cueing for proper completion.   OT reviewed location of charge port. Pt independent with recall.   OT added 2 contacts, added them to speed dial, and updated speed dial list to promote independent carryover. Pt utilized speed dial on phone to call niece to let her know she was done with her appointment. Pt mod I for completion.  PATIENT EDUCATION: Education details: phone and tablet use Person educated: Patient Education method: Explanation; demonstrated; visual, verbal, and tactile cues Education comprehension: verbalized understanding, returned demonstration requiring multimodal cueing, and needs further education  HOME EXERCISE PROGRAM: 10/25/2023: general low vision strategies 11/22/2023: [employer]  GOALS:  SHORT TERM GOALS: Target date: 11/22/2023    Patient will independently recall at least 2  compensatory strategies for visual impairment without cueing. Baseline: Goal status: MET  2.  Patient will return demonstration of visual adaptation use and verbalize understanding of how to obtain item(s) if desired.  Baseline:  Goal status: MET  LONG TERM GOALS: Target date: 03/22/2024     Patient will report at least two-point increase in average PSFS score or at least three-point increase in a single activity score indicating functionally significant improvement given minimum detectable change.  Baseline: 1.3 total score (See above for individual activity scores) Goal status: INITIAL  ASSESSMENT:  CLINICAL IMPRESSION: Pt demonstrates carryover with phone and tablet given improved accuracy during today's visit. Marking tablet  case remains a barrier given the material and need for contrast. There is a potential need for additional review and or modifications during upcoming visits. Pt is progressing towards remaining goal. Recommend extension of services until max rehab potential is met.   PERFORMANCE DEFICITS: in functional skills including ADLs, IADLs, coordination, Fine motor control, mobility, decreased knowledge of precautions, decreased knowledge of use of DME, vision, and UE functional use.   IMPAIRMENTS: are limiting patient from ADLs, IADLs, leisure, and social participation.   CO-MORBIDITIES: may have co-morbidities  that affects occupational performance. Patient will benefit from skilled OT to address above impairments and improve overall function.  REHAB POTENTIAL: Good  PLAN:  OT FREQUENCY: 1x/week  OT DURATION: 6 ADDITIONAL  sessions  PLANNED INTERVENTIONS: self care/ADL training, therapeutic exercise, therapeutic activity, functional mobility training, patient/family education, visual/perceptual remediation/compensation, coping strategies training, DME and/or AE instructions, and Re-evaluation  RECOMMENDED OTHER SERVICES: [facility]  CONSULTED AND AGREED WITH PLAN OF CARE: Patient and family  PLAN FOR NEXT SESSION: writing Nail polish for tablet case (volume button)?  Niece can be present for appointments but prefers to sit in blue [place] Minivan in parking lot to allow her aunt to focus on therapy session. Can be reached by phone call using pt's phone.   Subsequent visits: Go over low vision packet PRN; cards PRN; sunglasses; crocheting; phone and tablet as needed   Jocelyn CHRISTELLA Bottom, OT 01/17/2024, 1:03 PM

## 2024-01-22 NOTE — Addendum Note (Signed)
 Addended by: MANNY JOCELYN HERO on: 01/22/2024 08:15 PM   Modules accepted: Orders

## 2024-02-14 ENCOUNTER — Ambulatory Visit: Attending: Optometry | Admitting: Occupational Therapy

## 2024-02-14 DIAGNOSIS — R41842 Visuospatial deficit: Secondary | ICD-10-CM | POA: Insufficient documentation

## 2024-02-14 DIAGNOSIS — H409 Unspecified glaucoma: Secondary | ICD-10-CM | POA: Insufficient documentation

## 2024-02-14 DIAGNOSIS — H543 Unqualified visual loss, both eyes: Secondary | ICD-10-CM | POA: Insufficient documentation

## 2024-02-14 DIAGNOSIS — M6281 Muscle weakness (generalized): Secondary | ICD-10-CM | POA: Insufficient documentation

## 2024-02-14 NOTE — Therapy (Signed)
 OUTPATIENT OCCUPATIONAL THERAPY LOW VISION TREATMENT  Patient Name: [name] MRN: [id number] DOB:February 14, 1956, 68 y.o., female Today's Date: 02/14/2024  PCP: [name]  REFERRING PROVIDER: Octavia Bruckner, MD  END OF SESSION:  OT End of Session - 02/14/24 1210     Visit Number 11    Number of Visits 16    Date for Recertification  03/22/24    Authorization Type Medicare    OT Start Time 1151    OT Stop Time 1234    OT Time Calculation (min) 43 min    Activity Tolerance Patient tolerated treatment well    Behavior During Therapy WFL for tasks assessed/performed         Past Medical History:  Diagnosis Date   Alcohol abuse    Chronic back pain    Colitis    CVA (cerebral infarction)    Fatty liver    Gastric ulcer    GI bleed    Hepatitis    Hepatitis C   MDD (major depressive disorder)    Pancreatitis    PTSD (post-traumatic stress disorder)    Seizures (HCC)    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) 11/05/2015   Stroke (HCC)    UTI (lower urinary tract infection)    Vision abnormalities    Past Surgical History:  Procedure Laterality Date   ABDOMINAL HYSTERECTOMY     APPENDECTOMY     COLONOSCOPY WITH PROPOFOL  N/A 08/13/2015   Procedure: COLONOSCOPY WITH PROPOFOL ;  Surgeon: Toribio SHAUNNA Cedar, MD;  Location: [facility];  Service: Endoscopy;  Laterality: N/A;   ESOPHAGOGASTRODUODENOSCOPY N/A 05/23/2014   Procedure: ESOPHAGOGASTRODUODENOSCOPY (EGD);  Surgeon: Gordy CHRISTELLA Starch, MD;  Location: [facility] ENDOSCOPY;  Service: Endoscopy;  Laterality: N/A;   ESOPHAGOGASTRODUODENOSCOPY (EGD) WITH PROPOFOL  N/A 08/13/2015   Procedure: ESOPHAGOGASTRODUODENOSCOPY (EGD) WITH PROPOFOL ;  Surgeon: Toribio SHAUNNA Cedar, MD;  Location: [facility];  Service: Endoscopy;  Laterality: N/A;   ESOPHAGOGASTRODUODENOSCOPY ENDOSCOPY     gall stone removed     Patient Active Problem List   Diagnosis Date Noted   Syncope and collapse 01/13/2024   Mallet toe, acquired,  unspecified laterality 11/26/2021   Callus 08/23/2021   Pincer nail deformity 05/19/2021   Hypotension 01/28/2021   AKI (acute kidney injury) 01/28/2021   Prolonged QT interval 01/28/2021   Influenza A 01/28/2021   Plantar fasciitis of right foot 01/15/2021   Hyperlipidemia 11/03/2016   Former smoker 01/11/2016   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/05/2015   Left hip pain    History of GI bleed    Spastic bladder    Chronic back pain    ETOH abuse    Hypokalemia    PTSD (post-traumatic stress disorder)    Gastroesophageal reflux disease without esophagitis    Acute ischemic stroke (HCC) 09/27/2015   Acute CVA (cerebrovascular accident) (HCC) 09/27/2015   Smoker    Left-sided weakness 09/26/2015   Acute gastric ulcer    GI bleed 05/22/2014   Acute GI bleeding 05/22/2014   Acute blood loss anemia 05/22/2014   History of stroke 05/22/2014   Mixed hyperlipidemia 05/22/2014   Gastrointestinal hemorrhage with melena    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) 10/09/2012   Flank pain 10/09/2012   Right pontine stroke (HCC) 10/09/2012   ABDOMINAL PAIN OTHER SPECIFIED SITE 05/17/2010   Alcohol abuse 06/16/2007   Cocaine abuse (HCC) 06/16/2007   DEPRESSION 06/16/2007   Essential hypertension 06/16/2007  Cerebral artery occlusion with cerebral infarction (HCC) 06/16/2007   Asthma 06/16/2007   GERD 06/16/2007   PANCREATITIS 11/09/2006   ESOPHAGITIS, REFLUX 05/23/2006   ONSET DATE: 09/11/2023 (Date of referral); 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    Pt accompanied  by: self  PERTINENT HISTORY: PMH: Severe Glaucoma, CVA, MDD, PTSD, HTN, asthma, Hepatitis, seizures, chronic back pain, cocaine abuse, and alcohol abuse  PAIN:  Are you having pain? No  FALLS: Has patient fallen in last 6 months? Yes. Number of falls 3 reporting L knee gave out  LIVING ENVIRONMENT: Lives with: lives with their family Lives in: House/apartment Stairs: Yes: Internal: 12 steps; on right going up and External: 1 steps; none Has following equipment at home: Walker - 4 wheeled, shower chair, and bed side commode  PLOF: Independent with basic ADLs and Needs assistance with gait; retired futures trader; liked to crochet  PATIENT GOALS: learn low vision strategies  OBJECTIVE:   GLASSES: Wears glasses mostly for seeing the TV  CURRENT MODIFICATIONS/ADAPTIONS: Niece reads for her  DEVICES: Android based tablet for [employer]  - gets locked out, hard to see  PHONE: flip phone with large buttons for phone calls  HAND DOMINANCE: Left  ADLs: Overall ADLs: mod I  IADLs: Shopping: walks around [place] to pick out items Light housekeeping: sweeps floors and cleans tables, feels surfaces to make sure it's fully clean Meal Prep: uses microwave, liked to cook spare ribs and deviled eggs Community mobility: dependent Medication management: mod I Financial management: dependent Handwriting: hasn't written well since her stroke  MOBILITY STATUS: Needs Assist: rollator and SBA for safety  FUNCTIONAL OUTCOME MEASURES: PSFS: 1.3 total score   COGNITION: Overall cognitive status: Within functional limits for tasks assessed  VISUAL FINDINGS: Baseline vision: wears glasses for TV Visual history: glaucoma, cataracts, and corrective eye surgery  OBSERVATIONS: Pt ambulates with use of rollator. No loss of balance though requires tactile and verbal cues to navigate clinic environment.  TODAY'S TREATMENT:     Client was educated on writing strategies including:  Use of bold  writer marker or contrasting felt-tip pen, use of writing guide, use of tracing paper for better letter and number formation, and using capital letters to improve overall legibility. Pt returned demonstration requiring multimodal cueing and was provided with additional tracing paper copies.   PATIENT EDUCATION: Education details: writing Person educated: Patient Education method: Explanation; demonstrated; handout; visual, verbal, and tactile cues Education comprehension: verbalized understanding, returned demonstration requiring multimodal cueing, and needs further education  HOME EXERCISE PROGRAM: 10/25/2023: general low vision strategies 11/22/2023: [employer]  GOALS:  SHORT TERM GOALS: Target date: 11/22/2023    Patient will independently recall at least 2 compensatory strategies for visual impairment without cueing. Baseline: Goal status: MET  2.  Patient will return demonstration of visual adaptation use and verbalize understanding of how to obtain item(s) if desired.  Baseline:  Goal status: MET  LONG TERM GOALS: Target date: 03/22/2024     Patient will report at least two-point increase in average PSFS score or at least three-point increase in a single activity score indicating functionally significant improvement given minimum detectable change.  Baseline: 1.3 total  score (See above for individual activity scores) Goal status: INITIAL  ASSESSMENT:  CLINICAL IMPRESSION: Pt demonstrates fair understanding during today's visit though will likely require repeat education in addition to home practice. Currently, even with writing guide, pt's handwriting is illegible. Will continue therapy efforts as able.   PERFORMANCE DEFICITS: in functional skills including ADLs, IADLs, coordination, Fine motor control, mobility, decreased knowledge of precautions, decreased knowledge of use of DME, vision, and UE functional use.   IMPAIRMENTS: are limiting patient from ADLs, IADLs,  leisure, and social participation.   CO-MORBIDITIES: may have co-morbidities  that affects occupational performance. Patient will benefit from skilled OT to address above impairments and improve overall function.  REHAB POTENTIAL: Good  PLAN:  OT FREQUENCY: 1x/week  OT DURATION: 6 ADDITIONAL  sessions  PLANNED INTERVENTIONS: self care/ADL training, therapeutic exercise, therapeutic activity, functional mobility training, patient/family education, visual/perceptual remediation/compensation, coping strategies training, DME and/or AE instructions, and Re-evaluation  RECOMMENDED OTHER SERVICES: [facility]  CONSULTED AND AGREED WITH PLAN OF CARE: Patient and family  PLAN FOR NEXT SESSION: writing highlighted vs bold writer options Nail polish for tablet case (volume button)?  Niece can be present for appointments but prefers to sit in blue [place] Minivan in parking lot to allow her aunt to focus on therapy session. Can be reached by phone call using pt's phone.   Subsequent visits: Go over low vision packet PRN; cards PRN; sunglasses; crocheting; phone and tablet as needed   Jocelyn CHRISTELLA Bottom, OT 02/14/2024, 1:00 PM

## 2024-02-15 NOTE — Addendum Note (Signed)
 Encounter addended by: Adhvik Canady N on: 02/15/2024 8:33 AM  Actions taken: Imaging Exam ended

## 2024-02-15 NOTE — Addendum Note (Signed)
 Encounter addended by: Clarence Dunsmore N on: 02/15/2024 8:32 AM  Actions taken: Imaging Exam ended

## 2024-02-20 ENCOUNTER — Ambulatory Visit: Admitting: Occupational Therapy

## 2024-02-20 ENCOUNTER — Encounter: Payer: Self-pay | Admitting: *Deleted

## 2024-02-20 DIAGNOSIS — H409 Unspecified glaucoma: Secondary | ICD-10-CM

## 2024-02-20 DIAGNOSIS — M6281 Muscle weakness (generalized): Secondary | ICD-10-CM

## 2024-02-20 DIAGNOSIS — R41842 Visuospatial deficit: Secondary | ICD-10-CM

## 2024-02-20 DIAGNOSIS — H543 Unqualified visual loss, both eyes: Secondary | ICD-10-CM

## 2024-02-20 NOTE — Progress Notes (Unsigned)
 OFFICE NOTE:    Date:  02/21/2024  ID:  [name], DOB 1956/01/16, MRN [id number] PCP: Sim Emery CROME, MD  [facility] Providers Cardiologist:  None        Hypertension Hx of Cerebrovascular accident (CVA) Echocardiogram (01/14/2024): Ejection fraction > 75%, hyperdynamic left ventricular function, no regional wall motion abnormalities, grade 1 diastolic dysfunction, normal right ventricular systolic function, normal pulmonary artery systolic pressure, trivial mitral regurgitation  Carotid Ultrasound (01/14/2024): Bilateral internal carotid artery stenosis 1-39% Hyperlipidemia Aortic atherosclerosis Asthma Gastroesophageal reflux disease (GERD) hx of Gastrointestinal bleeding Gastric ulcer Fatty liver Post-traumatic stress disorder (PTSD) Major depressive disorder Seizure disorder Hepatitis C Syncope Cardiac Monitor (01/15/2024-01/28/2024): Predominant rhythm normal sinus rhythm, average heart rate 60 bpm, 18 supraventricular tachycardia runs, longest 15.4 seconds, premature ventricular contractions < 1%, premature atrial contractions < 1%  Former alcohol abuse  Former cocaine abuse        Discussed the use of AI scribe software for clinical note transcription with the patient, who gave verbal consent to proceed. History of Present Illness [name] is a 68 y.o. female referred by Sim Emery CROME, MD for syncope.  She was admitted to [facility] from November 1 to January 15, 2024 for syncope.  Her heart rate was was noted to be 40 bpm and systolic blood pressure was 82 mmHg. She was treated with IV fluids, and telemetry showed no significant arrhythmias. A head CT showed no acute intracranial process, while a brain MRI indicated moderate chronic small vessel ischemic disease with several remote lacunar infarcts. An EKG revealed normal sinus rhythm with a heart rate of 68 bpm, low voltage, normal axis, and nonspecific ST-T wave changes. An  echocardiogram showed an ejection fraction greater than 75%, hyperdynamic left ventricular function, no regional wall motion abnormalities, grade one diastolic dysfunction, normal right ventricular systolic function, normal pulmonary artery systolic pressure, and trivial mitral regurgitation. An outpatient event monitor was requested for further evaluation.  Her event monitor has not been officially read.  The report demonstrates a predominant rhythm of normal sinus rhythm with an average heart rate of 60 bpm, 18 runs of supraventricular tachycardia, the longest lasting 15.4 seconds, and less than 1% premature ventricular contractions and premature atrial contractions.  There were no prolonged pauses or evidence of high-grade heart block.  She is accompanied by her niece and nephew. She experienced dizziness before passing out while sitting at a table. Her blood pressure was noted to be extremely low at home, and EMS was unable to obtain a reading. She also vomited during the episode.  She has not had a recurrence.  She experiences shortness of breath on exertion, which has been present since before her hospital admission and has gradually worsened over time. She uses a walker and needs to stop and rest due to breathlessness. She has a long history of smoking but quit three years ago. No current use of inhalers, but she used to take albuterol  as needed. She also experiences chest pain associated with shortness of breath.  Family history includes a sister who died from complications related to a congenital heart defect. She has no children and her husband is deceased. 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  She is a former smoker.  She also used to abuse alcohol and cocaine but  has been sober for at least 10 years.   ROS-See HPI     Studies Reviewed:  LABS 01/13/24: hsTrop I 5>>6 01/13/24: ALT 18, Hgb 12.4, PLT 174K, TSH 1.868 01/15/24: K 3.7, SCr 0.98, eGFR >  60  RADIOLOGY Head CT: No acute intracranial process (01/13/2024) Chest X-ray: Minimal atelectasis left lung base, no acute findings (01/13/2024) Brain MRI: No acute intracranial abnormality, moderate chronic small vessel ischemic disease, several remote lacunar infarcts in bilateral basal ganglia, left cerebellum, right pontomedullary junction (01/13/2024)  EKG (01/13/2024): Normal sinus rhythm, heart rate 68 bpm, low voltage, normal axis, nonspecific ST-T wave changes, QTc 461 ms         Physical Exam:  VS:  BP (!) 120/58   Pulse (!) 53   Ht 5' 3 (1.6 m)   Wt 123 lb 3.2 oz (55.9 kg)   SpO2 95%   BMI 21.82 kg/m        Wt Readings from Last 3 Encounters:  02/21/24 123 lb 3.2 oz (55.9 kg)  01/15/24 124 lb 5.4 oz (56.4 kg)  02/20/23 130 lb (59 kg)    Constitutional:      Appearance: Not in distress. Chronically ill-appearing.  Neck:     Vascular: No carotid bruit. JVD normal.  Pulmonary:     Breath sounds: Normal breath sounds. No wheezing. No rales.  Cardiovascular:     Normal rate. Regular rhythm.     Murmurs: There is no murmur.     Comments: DP/PT 1+ bilat Edema:    Peripheral edema absent.  Abdominal:     Palpations: Abdomen is soft.       Assessment and Plan:    Assessment & Plan Syncope and collapse Syncope was likely due to hypotension in the setting of anti-hypertensive medications (lisinopril and hydrochlorothiazide ). These medications were stopped in the hospital. Her blood pressure is better now.  Recent event monitor showed no significant arrhythmias or pauses. Echocardiogram showed normal EF. No further testing needed unless she has recurrent syncope. Precordial chest pain Shortness of breath Exertional chest pain and dyspnea with risk factors for coronary artery disease, including prior stroke, hypertension, hyperlipidemia and smoking history.  Her shortness of breath may also be related to COPD.  She had normal EF on echocardiogram.  I have recommended  proceeding with coronary CTA to rule out obstructive CAD. - Order coronary CTA to evaluate for obstructive CAD. - Prescribe metoprolol  tartrate 25 mg for coronary CTA. - BMET today Claudication She has a history of leg pain which is somewhat suspicious for claudication.  - Order ABIs, arterial dopplers to r/o peripheral arterial disease. Aortic atherosclerosis - Continue Plavix  75 mg daily. - Continue Lipitor  20 mg daily. Pure hypercholesterolemia Goal LDL < 70. - Continue Lipitor  20 mg daily. - Will order updated lipid panel if significant calcification or CAD is found on coronary CTA.       Dispo:  Follow up in 3 mos w Glendia Ferrier, PA-C   Signed, Glendia Ferrier, PA-C

## 2024-02-20 NOTE — Therapy (Signed)
 OUTPATIENT OCCUPATIONAL THERAPY LOW VISION TREATMENT  Patient Name: [name] MRN: [id number] DOB:[birth year], 68 y.o., female Today's Date: 02/20/2024  PCP: [name]  REFERRING PROVIDER: Octavia Bruckner, MD  END OF SESSION:  OT End of Session - 02/20/24 1115     Visit Number 12    Number of Visits 16    Date for Recertification  03/22/24    Authorization Type Medicare    OT Start Time 1107    OT Stop Time 1145    OT Time Calculation (min) 38 min    Activity Tolerance Patient tolerated treatment well    Behavior During Therapy WFL for tasks assessed/performed         Past Medical History:  Diagnosis Date   Alcohol abuse    Chronic back pain    Colitis    CVA (cerebral infarction)    Fatty liver    Gastric ulcer    GI bleed    Hepatitis    Hepatitis C   MDD (major depressive disorder)    Pancreatitis    PTSD (post-traumatic stress disorder)    Seizures (HCC)    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) 11/05/2015   Stroke (HCC)    UTI (lower urinary tract infection)    Vision abnormalities    Past Surgical History:  Procedure Laterality Date   ABDOMINAL HYSTERECTOMY     APPENDECTOMY     COLONOSCOPY WITH PROPOFOL  N/A 08/13/2015   Procedure: COLONOSCOPY WITH PROPOFOL ;  Surgeon: Toribio SHAUNNA Cedar, MD;  Location: [facility] ENDOSCOPY;  Service: Endoscopy;  Laterality: N/A;   ESOPHAGOGASTRODUODENOSCOPY N/A 05/23/2014   Procedure: ESOPHAGOGASTRODUODENOSCOPY (EGD);  Surgeon: Gordy CHRISTELLA Starch, MD;  Location: [facility] ENDOSCOPY;  Service: Endoscopy;  Laterality: N/A;   ESOPHAGOGASTRODUODENOSCOPY (EGD) WITH PROPOFOL  N/A 08/13/2015   Procedure: ESOPHAGOGASTRODUODENOSCOPY (EGD) WITH PROPOFOL ;  Surgeon: Toribio SHAUNNA Cedar, MD;  Location: [facility] ENDOSCOPY;  Service: Endoscopy;  Laterality: N/A;   ESOPHAGOGASTRODUODENOSCOPY ENDOSCOPY     gall stone removed     Patient Active Problem List   Diagnosis Date Noted   Syncope and collapse 01/13/2024   Mallet toe, acquired,  unspecified laterality 11/26/2021   Callus 08/23/2021   Pincer nail deformity 05/19/2021   Hypotension 01/28/2021   AKI (acute kidney injury) 01/28/2021   Prolonged QT interval 01/28/2021   Influenza A 01/28/2021   Plantar fasciitis of right foot 01/15/2021   Hyperlipidemia 11/03/2016   Former smoker 01/11/2016   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/05/2015   Left hip pain    History of GI bleed    Spastic bladder    Chronic back pain    ETOH abuse    Hypokalemia    PTSD (post-traumatic stress disorder)    Gastroesophageal reflux disease without esophagitis    Acute ischemic stroke (HCC) 09/27/2015   Acute CVA (cerebrovascular accident) (HCC) 09/27/2015   Smoker    Left-sided weakness 09/26/2015   Acute gastric ulcer    GI bleed 05/22/2014   Acute GI bleeding 05/22/2014   Acute blood loss anemia 05/22/2014   History of stroke 05/22/2014   Mixed hyperlipidemia 05/22/2014   Gastrointestinal hemorrhage with melena    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) 10/09/2012   Flank pain 10/09/2012   Right pontine stroke (HCC) 10/09/2012   ABDOMINAL PAIN OTHER SPECIFIED SITE 05/17/2010   Alcohol abuse 06/16/2007   Cocaine abuse (HCC) 06/16/2007   DEPRESSION 06/16/2007   Essential hypertension 06/16/2007  Cerebral artery occlusion with cerebral infarction (HCC) 06/16/2007   Asthma 06/16/2007   GERD 06/16/2007   PANCREATITIS 11/09/2006   ESOPHAGITIS, REFLUX 05/23/2006   ONSET DATE: 09/11/2023 (Date of referral); Legally blind for years (>8)  REFERRING DIAG: severe glaucoma   THERAPY DIAG:  Visuospatial deficit  Low vision, both eyes  Severe stage glaucoma  Muscle weakness (generalized)  Rationale for Evaluation and Treatment: Rehabilitation  SUBJECTIVE:   SUBJECTIVE STATEMENT: Patient reports she has been practicing writing at home. No specific preference for highlighted vs Easy Writer tracing  paper.   She requests to add 2 contacts to her speed dial today.    Pt accompanied by: self  PERTINENT HISTORY: PMH: Severe Glaucoma, CVA, MDD, PTSD, HTN, asthma, Hepatitis, seizures, chronic back pain, cocaine abuse, and alcohol abuse  PAIN:  Are you having pain? No  FALLS: Has patient fallen in last 6 months? Yes. Number of falls 3 reporting L knee gave out  LIVING ENVIRONMENT: Lives with: lives with their family Lives in: House/apartment Stairs: Yes: Internal: 12 steps; on right going up and External: 1 steps; none Has following equipment at home: Walker - 4 wheeled, shower chair, and bed side commode  PLOF: Independent with basic ADLs and Needs assistance with gait; retired futures trader; liked to crochet  PATIENT GOALS: learn low vision strategies  OBJECTIVE:   GLASSES: Wears glasses mostly for seeing the TV  CURRENT MODIFICATIONS/ADAPTIONS: Niece reads for her  DEVICES: Android based tablet for Facebook  - gets locked out, hard to see  PHONE: flip phone with large buttons for phone calls  HAND DOMINANCE: Left  ADLs: Overall ADLs: mod I  IADLs: Shopping: walks around [place] to pick out items Light housekeeping: sweeps floors and cleans tables, feels surfaces to make sure it's fully clean Meal Prep: uses microwave, liked to cook spare ribs and deviled eggs Community mobility: dependent Medication management: mod I Financial management: dependent Handwriting: hasn't written well since her stroke  MOBILITY STATUS: Needs Assist: rollator and SBA for safety  FUNCTIONAL OUTCOME MEASURES: PSFS: 1.3 total score   COGNITION: Overall cognitive status: Within functional limits for tasks assessed  VISUAL FINDINGS: Baseline vision: wears glasses for TV Visual history: glaucoma, cataracts, and corrective eye surgery  OBSERVATIONS: Pt ambulates with use of rollator. No loss of balance though requires tactile and verbal cues to navigate clinic environment.  TODAY'S  TREATMENT:     OT reviewed colored volume down portion of case purple and volume up portion of case black to help with discrimination. Pt able to turn the volume up and down by utilizing the toggle feature after hitting the volume button. Pt required increased time and min cueing for proper completion.   OT reviewed use of Be My Eyes app to read paper placed on table in front of her. Pt able to improve to min cues for proper completion.   OT added contact, added it to speed dial, and updated speed dial list to promote independent carryover.   OT had pt call niece at end of session using speed dial. Pt able to call independently.   PATIENT EDUCATION: Education details: tablet and phone use Person educated: Patient Education method: Explanation; demonstrated; handout; visual, verbal, and tactile cues Education comprehension: verbalized understanding, returned demonstration requiring multimodal cueing, and needs further education  HOME EXERCISE PROGRAM: 10/25/2023: general low vision strategies 11/22/2023: [employer]  GOALS:  SHORT TERM GOALS: Target date: 11/22/2023    Patient will independently recall at least 2 compensatory strategies for  visual impairment without cueing. Baseline: Goal status: MET  2.  Patient will return demonstration of visual adaptation use and verbalize understanding of how to obtain item(s) if desired.  Baseline:  Goal status: MET  LONG TERM GOALS: Target date: 03/22/2024     Patient will report at least two-point increase in average PSFS score or at least three-point increase in a single activity score indicating functionally significant improvement given minimum detectable change.  Baseline: 1.3 total score (See above for individual activity scores) Goal status: INITIAL  ASSESSMENT:  CLINICAL IMPRESSION: Pt requiring repeat education with tablet use today thought able to improve use of Be My Eyes app with min cueing with repetition. Pt independent  this session with ability to use speed dial and call niece at end of session.   PERFORMANCE DEFICITS: in functional skills including ADLs, IADLs, coordination, Fine motor control, mobility, decreased knowledge of precautions, decreased knowledge of use of DME, vision, and UE functional use.   IMPAIRMENTS: are limiting patient from ADLs, IADLs, leisure, and social participation.   CO-MORBIDITIES: may have co-morbidities  that affects occupational performance. Patient will benefit from skilled OT to address above impairments and improve overall function.  REHAB POTENTIAL: Good  PLAN:  OT FREQUENCY: 1x/week  OT DURATION: 6 ADDITIONAL  sessions  PLANNED INTERVENTIONS: self care/ADL training, therapeutic exercise, therapeutic activity, functional mobility training, patient/family education, visual/perceptual remediation/compensation, coping strategies training, DME and/or AE instructions, and Re-evaluation  RECOMMENDED OTHER SERVICES: [facility]  CONSULTED AND AGREED WITH PLAN OF CARE: Patient and family  PLAN FOR NEXT SESSION:  Nail polish for tablet case (volume button)?  How is writing and cards  Niece can be present for appointments but prefers to sit in blue [place] Minivan in parking lot to allow her aunt to focus on therapy session. Can be reached by phone call using pt's phone.   Subsequent visits: Go over low vision packet PRN; cards PRN; sunglasses; crocheting; phone and tablet as needed   Jocelyn CHRISTELLA Bottom, OT 02/20/2024, 12:52 PM

## 2024-02-21 ENCOUNTER — Encounter: Payer: Self-pay | Admitting: Physician Assistant

## 2024-02-21 ENCOUNTER — Ambulatory Visit: Attending: Physician Assistant | Admitting: Physician Assistant

## 2024-02-21 VITALS — BP 120/58 | HR 53 | Ht 63.0 in | Wt 123.2 lb

## 2024-02-21 DIAGNOSIS — E7800 Pure hypercholesterolemia, unspecified: Secondary | ICD-10-CM | POA: Diagnosis present

## 2024-02-21 DIAGNOSIS — I739 Peripheral vascular disease, unspecified: Secondary | ICD-10-CM | POA: Diagnosis not present

## 2024-02-21 DIAGNOSIS — R55 Syncope and collapse: Secondary | ICD-10-CM | POA: Insufficient documentation

## 2024-02-21 DIAGNOSIS — I700 Atherosclerosis of aorta: Secondary | ICD-10-CM | POA: Diagnosis not present

## 2024-02-21 DIAGNOSIS — R0602 Shortness of breath: Secondary | ICD-10-CM | POA: Diagnosis not present

## 2024-02-21 DIAGNOSIS — R072 Precordial pain: Secondary | ICD-10-CM | POA: Diagnosis present

## 2024-02-21 MED ORDER — METOPROLOL TARTRATE 25 MG PO TABS: 25.0000 mg | ORAL_TABLET | Freq: Once | ORAL | 0 refills | Status: AC

## 2024-02-21 NOTE — Assessment & Plan Note (Signed)
 Goal LDL < 70. - Continue Lipitor  20 mg daily. - Will order updated lipid panel if significant calcification or CAD is found on coronary CTA.

## 2024-02-21 NOTE — Patient Instructions (Addendum)
 Medication Instructions:  Your physician recommends that you continue on your current medications as directed. Please refer to the Current Medication list given to you today.  *If you need a refill on your cardiac medications before your next appointment, please call your pharmacy*  Lab Work: TODAY:  BMET  If you have labs (blood work) drawn today and your tests are completely normal, you will receive your results only by: MyChart Message (if you have MyChart) OR A paper copy in the mail If you have any lab test that is abnormal or we need to change your treatment, we will call you to review the results.  Testing/Procedures: Your physician has requested that you have a lower extremity arterial exercise duplex W/ABI'S. During this test, exercise and ultrasound are used to evaluate arterial blood flow in the legs. Allow one hour for this exam. There are no restrictions or special instructions.    Your physician has requested that you have cardiac CT. Cardiac computed tomography (CT) is a painless test that uses an x-ray machine to take clear, detailed pictures of your heart. For further information please visit [email or web address] Please follow instruction sheet BELOW:    Your cardiac CT will be scheduled at one of the below locations:   [facility] [address] [place], [place] [address] [phone] (Severe contrast allergies only)  OR   [facility] [address] [place], [place] [address] [phone]  OR   [facility] [address] [place], [place] [address] [phone]  OR   [facility] V[facility] Tower [address]  [place], [place] [address]  OR   [facility] [address] [place], [place] [phone]  If scheduled at [facility], please arrive at the [facility] and Children's Entrance (Entrance C2) of [facility] 30 minutes prior to test start time. You can use  the FREE valet parking offered at entrance C (encouraged to control the heart rate for the test)  Proceed to the [facility] Radiology Department (first floor) to check-in and test prep.  All radiology patients and guests should use entrance C2 at [facility], accessed from [facility], even though the hospital's physical address listed is [address]  If scheduled at the Heart and Vascular Tower at [address], please enter the parking lot using the [address] entrance and use the FREE valet service at the patient drop-off area. Enter the building and check-in with registration on the main floor.  If scheduled at [facility], please arrive to the Heart and Vascular Center 15 mins early for check-in and test prep.  There is spacious parking and easy access to the radiology department from the [facility] Heart and Vascular entrance. Please enter here and check-in with the desk attendant.   If scheduled at [facility], please arrive 30 minutes early for check-in and test prep.  Please follow these instructions carefully (unless otherwise directed):  An IV will be required for this test and Nitroglycerin will be given.    On the Night Before the Test: Be sure to Drink plenty of water. Do not consume any caffeinated/decaffeinated beverages or chocolate 12 hours prior to your test. Do not take any antihistamines 12 hours prior to your test.   On the Day of the Test: Drink plenty of water until 1 hour prior to the test. Do not eat any food 1 hour prior to test. You may  take your regular medications prior to the test.  Take metoprolol  (Lopressor ) 25 two hours prior to test. THIS HAS BEEN SENT TO YOUR PHARMACY If you take Furosemide/Hydrochlorothiazide /Spironolactone/Chlorthalidone, please HOLD on the morning of the test. FEMALES- please wear underwire-free bra if available, avoid dresses & tight clothing         After the  Test: Drink plenty of water. After receiving IV contrast, you may experience a mild flushed feeling. This is normal. On occasion, you may experience a mild rash up to 24 hours after the test. This is not dangerous. If this occurs, you can take Benadryl 25 mg, Zyrtec, Claritin , or Allegra and increase your fluid intake. (Patients taking Tikosyn should avoid Benadryl, and may take Zyrtec, Claritin , or Allegra) If you experience trouble breathing, this can be serious. If it is severe call 911 IMMEDIATELY. If it is mild, please call our office.  We will call to schedule your test 2-4 weeks out understanding that some insurance companies will need an authorization prior to the service being performed.   For more information and frequently asked questions, please visit our website : [email or web address]  For non-scheduling related questions, please contact the cardiac imaging nurse navigator should you have any questions/concerns: Cardiac Imaging Nurse Navigators Direct Office Dial: [phone]   For scheduling needs, including cancellations and rescheduling, please call Brittany, [phone].  Follow-Up: At [facility], you and your health needs are our priority.  As part of our continuing mission to provide you with exceptional heart care, our providers are all part of one team.  This team includes your primary Cardiologist (physician) and Advanced Practice Providers or APPs (Physician Assistants and Nurse Practitioners) who all work together to provide you with the care you need, when you need it.  Your next appointment:   3 month(s)  Provider:   Glendia Ferrier, PA-C          We recommend signing up for the patient portal called MyChart.  Sign up information is provided on this After Visit Summary.  MyChart is used to connect with patients for Virtual Visits (Telemedicine).  Patients are able to view lab/test results, encounter notes, upcoming appointments, etc.   Non-urgent messages can be sent to your provider as well.   To learn more about what you can do with MyChart, go to [email or web address].   Other Instructions

## 2024-02-21 NOTE — Assessment & Plan Note (Signed)
 Syncope was likely due to hypotension in the setting of anti-hypertensive medications (lisinopril and hydrochlorothiazide ). These medications were stopped in the hospital. Her blood pressure is better now.  Recent event monitor showed no significant arrhythmias or pauses. Echocardiogram showed normal EF. No further testing needed unless she has recurrent syncope.

## 2024-02-22 ENCOUNTER — Ambulatory Visit: Payer: Self-pay | Admitting: Physician Assistant

## 2024-02-22 DIAGNOSIS — I2510 Atherosclerotic heart disease of native coronary artery without angina pectoris: Secondary | ICD-10-CM

## 2024-02-22 LAB — BASIC METABOLIC PANEL WITH GFR
BUN/Creatinine Ratio: 15 (ref 12–28)
BUN: 15 mg/dL (ref 8–27)
CO2: 25 mmol/L (ref 20–29)
Calcium: 9.6 mg/dL (ref 8.7–10.3)
Chloride: 108 mmol/L — ABNORMAL HIGH (ref 96–106)
Creatinine, Ser: 1.01 mg/dL — ABNORMAL HIGH (ref 0.57–1.00)
Glucose: 77 mg/dL (ref 70–99)
Potassium: 4.1 mmol/L (ref 3.5–5.2)
Sodium: 146 mmol/L — ABNORMAL HIGH (ref 134–144)
eGFR: 60 mL/min/1.73 (ref >59)

## 2024-02-28 ENCOUNTER — Ambulatory Visit: Admitting: Occupational Therapy

## 2024-02-28 DIAGNOSIS — R41842 Visuospatial deficit: Secondary | ICD-10-CM | POA: Diagnosis not present

## 2024-02-28 DIAGNOSIS — H409 Unspecified glaucoma: Secondary | ICD-10-CM

## 2024-02-28 DIAGNOSIS — H543 Unqualified visual loss, both eyes: Secondary | ICD-10-CM

## 2024-02-28 NOTE — Therapy (Signed)
 OUTPATIENT OCCUPATIONAL THERAPY LOW VISION TREATMENT  Patient Name: [name] MRN: [id number] DOB:04/02/1955, 68 y.o., female Today's Date: 02/28/2024  PCP: [name]  REFERRING PROVIDER: Octavia Bruckner, MD  END OF SESSION:  OT End of Session - 02/28/24 1207     Visit Number 13    Number of Visits 16    Date for Recertification  03/22/24    Authorization Type Medicare    OT Start Time 1205    OT Stop Time 1243    OT Time Calculation (min) 38 min    Activity Tolerance Patient tolerated treatment well    Behavior During Therapy WFL for tasks assessed/performed         Past Medical History:  Diagnosis Date   Alcohol abuse    sober x 10 years   Chronic back pain    Colitis    CVA (cerebral infarction)    Fatty liver    Gastric ulcer    GI bleed    Hepatitis    Hepatitis C   HLD (hyperlipidemia)    Hypertension    off meds since 01/2024 (BP lower)   Legally blind    MDD (major depressive disorder)    Pancreatitis    PTSD (post-traumatic stress disorder)    Seizures (HCC)    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) 11/05/2015   Stroke (HCC)    UTI (lower urinary tract infection)    Vision abnormalities    Past Surgical History:  Procedure Laterality Date   ABDOMINAL HYSTERECTOMY     APPENDECTOMY     CATARACT EXTRACTION     COLONOSCOPY WITH PROPOFOL  N/A 08/13/2015   Procedure: COLONOSCOPY WITH PROPOFOL ;  Surgeon: Alexa SHAUNNA Cedar, MD;  Location: [facility];  Service: Endoscopy;  Laterality: N/A;   ESOPHAGOGASTRODUODENOSCOPY N/A 05/23/2014   Procedure: ESOPHAGOGASTRODUODENOSCOPY (EGD);  Surgeon: [name] Starch, MD;  Location: [facility] ENDOSCOPY;  Service: Endoscopy;  Laterality: N/A;   ESOPHAGOGASTRODUODENOSCOPY (EGD) WITH PROPOFOL  N/A 08/13/2015   Procedure: ESOPHAGOGASTRODUODENOSCOPY (EGD) WITH PROPOFOL ;  Surgeon: Alexa SHAUNNA Cedar, MD;  Location: [facility];  Service: Endoscopy;  Laterality: N/A;   ESOPHAGOGASTRODUODENOSCOPY ENDOSCOPY      gall stone removed     Patient Active Problem List   Diagnosis Date Noted   Syncope and collapse 01/13/2024   Mallet toe, acquired, unspecified laterality 11/26/2021   Callus 08/23/2021   Pincer nail deformity 05/19/2021   Hypotension 01/28/2021   AKI (acute kidney injury) 01/28/2021   Prolonged QT interval 01/28/2021   Influenza A 01/28/2021   Plantar fasciitis of right foot 01/15/2021   Hyperlipidemia 11/03/2016   Former smoker 01/11/2016   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/05/2015   Left hip pain    History of GI bleed    Spastic bladder    Chronic back pain    ETOH abuse    Hypokalemia    PTSD (post-traumatic stress disorder)    Gastroesophageal reflux disease without esophagitis    Acute ischemic stroke (HCC) 09/27/2015   Acute CVA (cerebrovascular accident) (HCC) 09/27/2015   Smoker    Left-sided weakness 09/26/2015   Acute gastric ulcer    GI bleed 05/22/2014   Acute GI bleeding 05/22/2014   Acute blood loss anemia 05/22/2014   History of stroke 05/22/2014   Mixed hyperlipidemia 05/22/2014   Gastrointestinal hemorrhage with melena    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) 10/09/2012   Flank pain 10/09/2012  Right pontine stroke (HCC) 10/09/2012   ABDOMINAL PAIN OTHER SPECIFIED SITE 05/17/2010   Alcohol abuse 06/16/2007   Cocaine abuse (HCC) 06/16/2007   DEPRESSION 06/16/2007   Essential hypertension 06/16/2007   Cerebral artery occlusion with cerebral infarction (HCC) 06/16/2007   Asthma 06/16/2007   GERD 06/16/2007   PANCREATITIS 11/09/2006   ESOPHAGITIS, REFLUX 05/23/2006   ONSET DATE: 09/11/2023 (Date of referral); Legally blind for years (>8)  REFERRING DIAG: severe glaucoma   THERAPY DIAG:  Visuospatial deficit  Low vision, both eyes  Severe stage glaucoma  Rationale for Evaluation and Treatment: Rehabilitation  SUBJECTIVE:   SUBJECTIVE STATEMENT: Patient  reports she has been practicing writing at home. No specific preference for highlighted vs Easy Writer tracing paper.   She requests to add 2 contacts to her speed dial today.    Pt accompanied by: self  PERTINENT HISTORY: PMH: Severe Glaucoma, CVA, MDD, PTSD, HTN, asthma, Hepatitis, seizures, chronic back pain, cocaine abuse, and alcohol abuse  PAIN:  Are you having pain? No  FALLS: Has patient fallen in last 6 months? Yes. Number of falls 3 reporting L knee gave out  LIVING ENVIRONMENT: Lives with: lives with their family Lives in: House/apartment Stairs: Yes: Internal: 12 steps; on right going up and External: 1 steps; none Has following equipment at home: Walker - 4 wheeled, shower chair, and bed side commode  PLOF: Independent with basic ADLs and Needs assistance with gait; retired futures trader; liked to crochet  PATIENT GOALS: learn low vision strategies  OBJECTIVE:   GLASSES: Wears glasses mostly for seeing the TV  CURRENT MODIFICATIONS/ADAPTIONS: Niece reads for her  DEVICES: Android based tablet for Facebook  - gets locked out, hard to see  PHONE: flip phone with large buttons for phone calls  HAND DOMINANCE: Left  ADLs: Overall ADLs: mod I  IADLs: Shopping: walks around [place] to pick out items Light housekeeping: sweeps floors and cleans tables, feels surfaces to make sure it's fully clean Meal Prep: uses microwave, liked to cook spare ribs and deviled eggs Community mobility: dependent Medication management: mod I Financial management: dependent Handwriting: hasn't written well since her stroke  MOBILITY STATUS: Needs Assist: rollator and SBA for safety  FUNCTIONAL OUTCOME MEASURES: PSFS: 1.3 total score   COGNITION: Overall cognitive status: Within functional limits for tasks assessed  VISUAL FINDINGS: Baseline vision: wears glasses for TV Visual history: glaucoma, cataracts, and corrective eye surgery  OBSERVATIONS: Pt ambulates with use of  rollator. No loss of balance though requires tactile and verbal cues to navigate clinic environment.  TODAY'S TREATMENT:    - Self-care/home management completed for duration as noted below including: OT reviewed use of Be My Eyes app to read notepad placed on table in front of her. Pt able to improve to min cues for proper completion. She was encouraged to continue practicing use of app at home.   OT added 2 contacts to speed dial, and updated speed dial list to promote independent carryover.   - Therapeutic activities completed for duration as noted below including: OT had pt write name on paper with cues to make letters bigger. Pt able to maintain 75% legibility.   OT had pt name card suits and provided her with a new deck of cards for home use.   PATIENT EDUCATION: Education details: tablet and phone use; writing Person educated: Patient Education method: Explanation; demonstrated; handout; visual, verbal, and tactile cues Education comprehension: verbalized understanding, returned demonstration requiring multimodal cueing, and needs further education  HOME  EXERCISE PROGRAM: 10/25/2023: general low vision strategies 11/22/2023: [employer]  GOALS:  SHORT TERM GOALS: Target date: 11/22/2023    Patient will independently recall at least 2 compensatory strategies for visual impairment without cueing. Baseline: Goal status: MET  2.  Patient will return demonstration of visual adaptation use and verbalize understanding of how to obtain item(s) if desired.  Baseline:  Goal status: MET  LONG TERM GOALS: Target date: 03/22/2024     Patient will report at least two-point increase in average PSFS score or at least three-point increase in a single activity score indicating functionally significant improvement given minimum detectable change.  Baseline: 1.3 total score (See above for individual activity scores) Goal status: INITIAL  ASSESSMENT:  CLINICAL IMPRESSION: Pt seen for  OT session today secondary to visual deficits, which impacts ADL and IADL completion. Pt required repetition and cueing today for use of tablet and Be My Eyes. She is independent with recall of speed dial contacts. Progress toward remaining goals.   PERFORMANCE DEFICITS: in functional skills including ADLs, IADLs, coordination, Fine motor control, mobility, decreased knowledge of precautions, decreased knowledge of use of DME, vision, and UE functional use.   IMPAIRMENTS: are limiting patient from ADLs, IADLs, leisure, and social participation.   CO-MORBIDITIES: may have co-morbidities  that affects occupational performance. Patient will benefit from skilled OT to address above impairments and improve overall function.  REHAB POTENTIAL: Good  PLAN:  OT FREQUENCY: 1x/week  OT DURATION: 6 ADDITIONAL  sessions  PLANNED INTERVENTIONS: self care/ADL training, therapeutic exercise, therapeutic activity, functional mobility training, patient/family education, visual/perceptual remediation/compensation, coping strategies training, DME and/or AE instructions, and Re-evaluation  RECOMMENDED OTHER SERVICES: [facility]  CONSULTED AND AGREED WITH PLAN OF CARE: Patient and family  PLAN FOR NEXT SESSION:  Nail polish for tablet case (volume button)?  How is writing and cards ($ tree deck)  Niece can be present for appointments but prefers to sit in blue [place] Minivan in parking lot to allow her aunt to focus on therapy session. Can be reached by phone call using pt's phone.   Subsequent visits: Go over low vision packet PRN; cards PRN; sunglasses; crocheting; phone and tablet as needed   Jocelyn [name] Bottom, OT 02/28/2024, 1:08 PM

## 2024-03-04 ENCOUNTER — Ambulatory Visit: Admitting: Podiatry

## 2024-03-04 ENCOUNTER — Encounter: Payer: Self-pay | Admitting: Podiatry

## 2024-03-04 DIAGNOSIS — B351 Tinea unguium: Secondary | ICD-10-CM

## 2024-03-04 DIAGNOSIS — L84 Corns and callosities: Secondary | ICD-10-CM | POA: Diagnosis not present

## 2024-03-04 DIAGNOSIS — M79674 Pain in right toe(s): Secondary | ICD-10-CM

## 2024-03-04 DIAGNOSIS — M79675 Pain in left toe(s): Secondary | ICD-10-CM | POA: Diagnosis not present

## 2024-03-04 NOTE — Progress Notes (Signed)
 This patient returns to my office for at risk foot care.  This patient requires this care by a professional since this patient will be at risk due to having CVA, Blind and PTSD. This patient is unable to cut nails himself since the patient cannot reach his nails.These nails are painful walking and wearing shoes.  This patient presents for at risk foot care today.  General Appearance  Alert, conversant and in no acute stress.  Vascular  Dorsalis pedis and posterior tibial  pulses are palpable  bilaterally.  Capillary return is within normal limits  bilaterally. Temperature is within normal limits  bilaterally.  Neurologic  Senn-Weinstein monofilament wire test within normal limits  bilaterally. Muscle power within normal limits bilaterally.  Nails Thick disfigured discolored nails with subungual debris  from hallux to fifth toes bilaterally. No evidence of bacterial infection or drainage bilaterally.  Orthopedic  No limitations of motion  feet .  No crepitus or effusions noted.  No bony pathology or digital deformities noted. Mallet toe 3rd left and plantar flexed fifth right.  Skin  normotropic skin with no porokeratosis noted bilaterally.  Callus sub 5th met right foot.   Onychomycosis  Pain in right toes  Pain in left toes  Callus right foot.  Consent was obtained for treatment procedures.   Mechanical debridement of nails 1-5  bilaterally performed with a nail nipper.  Filed with dremel without incident. Debride callus with dremel tool.   Return office visit   3 months                   Told patient to return for periodic foot care and evaluation due to potential at risk complications.   Cordella Bold DPM

## 2024-03-05 ENCOUNTER — Ambulatory Visit: Admitting: Occupational Therapy

## 2024-03-05 DIAGNOSIS — R41842 Visuospatial deficit: Secondary | ICD-10-CM

## 2024-03-05 DIAGNOSIS — H409 Unspecified glaucoma: Secondary | ICD-10-CM

## 2024-03-05 DIAGNOSIS — H543 Unqualified visual loss, both eyes: Secondary | ICD-10-CM

## 2024-03-05 NOTE — Therapy (Signed)
 " OUTPATIENT OCCUPATIONAL THERAPY LOW VISION TREATMENT  Patient Name: [name] MRN: [id number] DOB:1955/12/08, 68 y.o., female Today's Date: 03/05/2024  PCP: [name]  REFERRING PROVIDER: Octavia Bruckner, MD  END OF SESSION:  OT End of Session - 03/05/24 1103     Visit Number 14    Number of Visits 16    Date for Recertification  03/22/24    Authorization Type Medicare    OT Start Time 1105    OT Stop Time 1150    OT Time Calculation (min) 45 min    Activity Tolerance Patient tolerated treatment well    Behavior During Therapy WFL for tasks assessed/performed         Past Medical History:  Diagnosis Date   Alcohol abuse    sober x 10 years   Chronic back pain    Colitis    CVA (cerebral infarction)    Fatty liver    Gastric ulcer    GI bleed    Hepatitis    Hepatitis C   HLD (hyperlipidemia)    Hypertension    off meds since 01/2024 (BP lower)   Legally blind    MDD (major depressive disorder)    Pancreatitis    PTSD (post-traumatic stress disorder)    Seizures (HCC)    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) 11/05/2015   Stroke (HCC)    UTI (lower urinary tract infection)    Vision abnormalities    Past Surgical History:  Procedure Laterality Date   ABDOMINAL HYSTERECTOMY     APPENDECTOMY     CATARACT EXTRACTION     COLONOSCOPY WITH PROPOFOL  N/A 08/13/2015   Procedure: COLONOSCOPY WITH PROPOFOL ;  Surgeon: Alexa SHAUNNA Cedar, MD;  Location: [facility];  Service: Endoscopy;  Laterality: N/A;   ESOPHAGOGASTRODUODENOSCOPY N/A 05/23/2014   Procedure: ESOPHAGOGASTRODUODENOSCOPY (EGD);  Surgeon: [name] Starch, MD;  Location: [facility] ENDOSCOPY;  Service: Endoscopy;  Laterality: N/A;   ESOPHAGOGASTRODUODENOSCOPY (EGD) WITH PROPOFOL  N/A 08/13/2015   Procedure: ESOPHAGOGASTRODUODENOSCOPY (EGD) WITH PROPOFOL ;  Surgeon: Alexa SHAUNNA Cedar, MD;  Location: [facility];  Service: Endoscopy;  Laterality: N/A;   ESOPHAGOGASTRODUODENOSCOPY ENDOSCOPY      gall stone removed     Patient Active Problem List   Diagnosis Date Noted   Syncope and collapse 01/13/2024   Mallet toe, acquired, unspecified laterality 11/26/2021   Callus 08/23/2021   Pincer nail deformity 05/19/2021   Hypotension 01/28/2021   AKI (acute kidney injury) 01/28/2021   Prolonged QT interval 01/28/2021   Influenza A 01/28/2021   Plantar fasciitis of right foot 01/15/2021   Hyperlipidemia 11/03/2016   Former smoker 01/11/2016   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/05/2015   Left hip pain    History of GI bleed    Spastic bladder    Chronic back pain    ETOH abuse    Hypokalemia    PTSD (post-traumatic stress disorder)    Gastroesophageal reflux disease without esophagitis    Acute ischemic stroke (HCC) 09/27/2015   Acute CVA (cerebrovascular accident) (HCC) 09/27/2015   Smoker    Left-sided weakness 09/26/2015   Acute gastric ulcer    GI bleed 05/22/2014   Acute GI bleeding 05/22/2014   Acute blood loss anemia 05/22/2014   History of stroke 05/22/2014   Mixed hyperlipidemia 05/22/2014   Gastrointestinal hemorrhage with melena    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) 10/09/2012   Flank pain 10/09/2012  Right pontine stroke (HCC) 10/09/2012   ABDOMINAL PAIN OTHER SPECIFIED SITE 05/17/2010   Alcohol abuse 06/16/2007   Cocaine abuse (HCC) 06/16/2007   DEPRESSION 06/16/2007   Essential hypertension 06/16/2007   Cerebral artery occlusion with cerebral infarction (HCC) 06/16/2007   Asthma 06/16/2007   GERD 06/16/2007   PANCREATITIS 11/09/2006   ESOPHAGITIS, REFLUX 05/23/2006   ONSET DATE: 09/11/2023 (Date of referral); Legally blind for years (>8)  REFERRING DIAG: severe glaucoma   THERAPY DIAG:  Visuospatial deficit  Low vision, both eyes  Severe stage glaucoma  Rationale for Evaluation and Treatment: Rehabilitation  SUBJECTIVE:   SUBJECTIVE STATEMENT: Patient  reports difficulty finding phone in rollator basket and the Facebook app on her tablet.  Pt accompanied by: self  PERTINENT HISTORY: PMH: Severe Glaucoma, CVA, MDD, PTSD, HTN, asthma, Hepatitis, seizures, chronic back pain, cocaine abuse, and alcohol abuse  PAIN:  Are you having pain? No  FALLS: Has patient fallen in last 6 months? Yes. Number of falls 3 reporting L knee gave out  LIVING ENVIRONMENT: Lives with: lives with their family Lives in: House/apartment Stairs: Yes: Internal: 12 steps; on right going up and External: 1 steps; none Has following equipment at home: Walker - 4 wheeled, shower chair, and bed side commode  PLOF: Independent with basic ADLs and Needs assistance with gait; retired futures trader; liked to crochet  PATIENT GOALS: learn low vision strategies  OBJECTIVE:   GLASSES: Wears glasses mostly for seeing the TV  CURRENT MODIFICATIONS/ADAPTIONS: Niece reads for her  DEVICES: Android based tablet for Facebook  - gets locked out, hard to see  PHONE: flip phone with large buttons for phone calls  HAND DOMINANCE: Left  ADLs: Overall ADLs: mod I  IADLs: Shopping: walks around [place] to pick out items Light housekeeping: sweeps floors and cleans tables, feels surfaces to make sure it's fully clean Meal Prep: uses microwave, liked to cook spare ribs and deviled eggs Community mobility: dependent Medication management: mod I Financial management: dependent Handwriting: hasn't written well since her stroke  MOBILITY STATUS: Needs Assist: rollator and SBA for safety  FUNCTIONAL OUTCOME MEASURES: PSFS: 1.3 total score   COGNITION: Overall cognitive status: Within functional limits for tasks assessed  VISUAL FINDINGS: Baseline vision: wears glasses for TV Visual history: glaucoma, cataracts, and corrective eye surgery  OBSERVATIONS: Pt ambulates with use of rollator. No loss of balance though requires tactile and verbal cues to navigate clinic  environment.  TODAY'S TREATMENT:    - Self-care/home management completed for duration as noted below including: OT educated pt on adding contrast to phone to help with location of it inside her rollator basket.   Additionally label maker was trialed for tablet case to mark volume up button.  Therapist also changed wallpaper to help with app location. Therapist oriented apps in numerical fashion to resemble speed dial on phone to help with recall. Index card provided with app location written out. Removed strap from tablet case as pt doesn't use and gets in the way of using apps.   PATIENT EDUCATION: Education details: tablet and phone use Person educated: Patient Education method: Explanation; demonstrated; handout; visual, verbal, and tactile cues Education comprehension: verbalized understanding, returned demonstration requiring multimodal cueing, and needs further education  HOME EXERCISE PROGRAM: 10/25/2023: general low vision strategies 11/22/2023: [employer]  GOALS:  SHORT TERM GOALS: Target date: 11/22/2023    Patient will independently recall at least 2 compensatory strategies for visual impairment without cueing. Baseline: Goal status: MET  2.  Patient  will return demonstration of visual adaptation use and verbalize understanding of how to obtain item(s) if desired.  Baseline:  Goal status: MET  LONG TERM GOALS: Target date: 03/22/2024     Patient will report at least two-point increase in average PSFS score or at least three-point increase in a single activity score indicating functionally significant improvement given minimum detectable change.  Baseline: 1.3 total score (See above for individual activity scores) Goal status: INITIAL  ASSESSMENT:  CLINICAL IMPRESSION: Pt seen for OT session today secondary to visual deficits, which impacts ADL and IADL completion. Pt required strategies for location of phone, buttons, and apps this session. Progress toward  remaining goals.   PERFORMANCE DEFICITS: in functional skills including ADLs, IADLs, coordination, Fine motor control, mobility, decreased knowledge of precautions, decreased knowledge of use of DME, vision, and UE functional use.   IMPAIRMENTS: are limiting patient from ADLs, IADLs, leisure, and social participation.   CO-MORBIDITIES: may have co-morbidities  that affects occupational performance. Patient will benefit from skilled OT to address above impairments and improve overall function.  REHAB POTENTIAL: Good  PLAN:  OT FREQUENCY: 1x/week  OT DURATION: 6 ADDITIONAL  sessions  PLANNED INTERVENTIONS: self care/ADL training, therapeutic exercise, therapeutic activity, functional mobility training, patient/family education, visual/perceptual remediation/compensation, coping strategies training, DME and/or AE instructions, and Re-evaluation  RECOMMENDED OTHER SERVICES: [facility]  CONSULTED AND AGREED WITH PLAN OF CARE: Patient and family  PLAN FOR NEXT SESSION:  Nail polish for tablet case (volume button)? - how is label staying on case? App orientation on tablet  How is writing and cards ($ tree deck)  Niece can be present for appointments but prefers to sit in blue [place] Min[place] in parking lot to allow her aunt to focus on therapy session. Can be reached by phone call using pt's phone.   Subsequent visits: Go over low vision packet PRN; cards PRN; sunglasses; crocheting; phone and tablet as needed   Jocelyn [name] Bottom, OT 03/05/2024, 12:26 PM   "

## 2024-03-08 ENCOUNTER — Encounter (HOSPITAL_COMMUNITY): Payer: Self-pay

## 2024-03-08 ENCOUNTER — Ambulatory Visit (HOSPITAL_COMMUNITY)
Admission: RE | Admit: 2024-03-08 | Discharge: 2024-03-08 | Disposition: A | Discharge status: Home / Self Care | Source: Ambulatory Visit | Attending: Physician Assistant | Admitting: Physician Assistant

## 2024-03-08 DIAGNOSIS — I739 Peripheral vascular disease, unspecified: Secondary | ICD-10-CM | POA: Diagnosis present

## 2024-03-09 LAB — VAS US ABI WITH/WO TBI
Left ABI: 0.98
Right ABI: 1.12

## 2024-03-11 ENCOUNTER — Encounter: Payer: Self-pay | Admitting: Physician Assistant

## 2024-03-12 ENCOUNTER — Telehealth (HOSPITAL_COMMUNITY): Payer: Self-pay | Admitting: *Deleted

## 2024-03-12 ENCOUNTER — Ambulatory Visit: Admitting: Occupational Therapy

## 2024-03-12 NOTE — Telephone Encounter (Signed)
 Attempted to call patient regarding upcoming cardiac CT appointment. Left message on voicemail with name and callback number Sid Seats RN Navigator Cardiac Imaging [facility] [phone] Office

## 2024-03-13 ENCOUNTER — Ambulatory Visit (HOSPITAL_COMMUNITY)
Admission: RE | Admit: 2024-03-13 | Discharge: 2024-03-13 | Disposition: A | Discharge status: Home / Self Care | Source: Ambulatory Visit | Attending: Student in an Organized Health Care Education/Training Program | Admitting: Student in an Organized Health Care Education/Training Program

## 2024-03-13 DIAGNOSIS — R072 Precordial pain: Secondary | ICD-10-CM | POA: Diagnosis not present

## 2024-03-13 DIAGNOSIS — R0602 Shortness of breath: Secondary | ICD-10-CM | POA: Insufficient documentation

## 2024-03-13 DIAGNOSIS — R55 Syncope and collapse: Secondary | ICD-10-CM | POA: Diagnosis present

## 2024-03-13 DIAGNOSIS — R931 Abnormal findings on diagnostic imaging of heart and coronary circulation: Secondary | ICD-10-CM | POA: Diagnosis present

## 2024-03-13 MED ORDER — METOPROLOL TARTRATE 5 MG/5ML IV SOLN: 10.0000 mg | Freq: Once | INTRAVENOUS | Status: DC | PRN

## 2024-03-13 MED ORDER — DILTIAZEM HCL 25 MG/5ML IV SOLN: 10.0000 mg | INTRAVENOUS | Status: DC | PRN

## 2024-03-13 MED ORDER — IOHEXOL 350 MG/ML SOLN
100.0000 mL | Freq: Once | INTRAVENOUS | Status: AC | PRN
  Administered 2024-03-13: 100 mL via INTRAVENOUS

## 2024-03-13 MED ORDER — NITROGLYCERIN 0.4 MG SL SUBL: 0.8000 mg | SUBLINGUAL_TABLET | Freq: Once | SUBLINGUAL | Status: DC

## 2024-03-14 ENCOUNTER — Ambulatory Visit (HOSPITAL_COMMUNITY)
Admission: RE | Admit: 2024-03-14 | Discharge: 2024-03-14 | Disposition: A | Discharge status: Home / Self Care | Source: Ambulatory Visit | Attending: Cardiology | Admitting: Cardiology

## 2024-03-14 ENCOUNTER — Other Ambulatory Visit: Payer: Self-pay | Admitting: Cardiology

## 2024-03-14 DIAGNOSIS — R55 Syncope and collapse: Secondary | ICD-10-CM | POA: Diagnosis present

## 2024-03-14 DIAGNOSIS — R931 Abnormal findings on diagnostic imaging of heart and coronary circulation: Secondary | ICD-10-CM

## 2024-03-14 DIAGNOSIS — R072 Precordial pain: Secondary | ICD-10-CM | POA: Diagnosis present

## 2024-03-14 DIAGNOSIS — R0602 Shortness of breath: Secondary | ICD-10-CM | POA: Diagnosis present

## 2024-03-15 ENCOUNTER — Ambulatory Visit (HOSPITAL_BASED_OUTPATIENT_CLINIC_OR_DEPARTMENT_OTHER): Payer: Self-pay | Admitting: Family

## 2024-03-15 NOTE — Telephone Encounter (Signed)
 Spoke with pt regarding test results. Pt verbalized understanding. Pt agreed to plan and to come in on 03/29/24 for repeat labs. Pt had no further questions or concerns.

## 2024-03-15 NOTE — Addendum Note (Signed)
 Addended by: Laree Garron S on: 03/15/2024 08:06 AM   Modules accepted: Orders

## 2024-03-17 ENCOUNTER — Encounter: Payer: Self-pay | Admitting: Physician Assistant

## 2024-03-17 DIAGNOSIS — I2510 Atherosclerotic heart disease of native coronary artery without angina pectoris: Secondary | ICD-10-CM | POA: Insufficient documentation

## 2024-03-19 ENCOUNTER — Ambulatory Visit: Attending: Optometry | Admitting: Occupational Therapy

## 2024-03-20 ENCOUNTER — Telehealth: Payer: Self-pay | Admitting: Occupational Therapy

## 2024-03-20 NOTE — Telephone Encounter (Signed)
 This is to document my attempt to call patient after no-show for OT appt yesterday PM.  This is patient's # 1 missed appt.   Primary phone number(s) was used in efforts to contact the patient.   Voice mail was left requesting the pt or her niece call the clinic back at [phone] to reschedule or determine how they wish to procede with therapy.

## 2024-04-01 DIAGNOSIS — R55 Syncope and collapse: Secondary | ICD-10-CM

## 2024-04-02 ENCOUNTER — Ambulatory Visit: Payer: Self-pay | Admitting: Physician Assistant

## 2024-04-05 NOTE — Progress Notes (Signed)
 Called and spoke with patient regarding her recent Cardiac CTA results. Patient verbalized understanding. Patient stated that she does take Atorvastatin  20mg  daily, Plavix  75mg  daily, but does not take Metoprolol  tartrate 25mg  twice daily. Please advise.

## 2024-05-21 ENCOUNTER — Ambulatory Visit: Admitting: Physician Assistant

## 2024-06-05 ENCOUNTER — Ambulatory Visit: Admitting: Podiatry
